# Patient Record
Sex: Male | Born: 1980 | Race: Black or African American | Hispanic: No | Marital: Single
Health system: Midwestern US, Community
[De-identification: ages and names within clinical notes are randomized; demographics above are authoritative.]

## PROBLEM LIST (undated history)

## (undated) DIAGNOSIS — M549 Dorsalgia, unspecified: Secondary | ICD-10-CM

## (undated) DIAGNOSIS — I10 Essential (primary) hypertension: Secondary | ICD-10-CM

## (undated) DIAGNOSIS — E119 Type 2 diabetes mellitus without complications: Secondary | ICD-10-CM

## (undated) DIAGNOSIS — M79606 Pain in leg, unspecified: Secondary | ICD-10-CM

## (undated) DIAGNOSIS — R202 Paresthesia of skin: Secondary | ICD-10-CM

## (undated) DIAGNOSIS — I82409 Acute embolism and thrombosis of unspecified deep veins of unspecified lower extremity: Secondary | ICD-10-CM

## (undated) DIAGNOSIS — G8929 Other chronic pain: Secondary | ICD-10-CM

## (undated) DIAGNOSIS — M5430 Sciatica, unspecified side: Secondary | ICD-10-CM

## (undated) DIAGNOSIS — R7303 Prediabetes: Secondary | ICD-10-CM

## (undated) DIAGNOSIS — R609 Edema, unspecified: Secondary | ICD-10-CM

## (undated) DIAGNOSIS — I739 Peripheral vascular disease, unspecified: Secondary | ICD-10-CM

## (undated) DIAGNOSIS — Z7689 Persons encountering health services in other specified circumstances: Secondary | ICD-10-CM

## (undated) HISTORY — PX: OTHER SURGICAL HISTORY: SHX169

## (undated) HISTORY — DX: Type 2 diabetes mellitus without complications: E11.9

## (undated) HISTORY — PX: FASCIOTOMY: SHX132

## (undated) HISTORY — PX: THROMBECTOMY: PRO61

---

## 2011-09-24 NOTE — ED Notes (Signed)
Pt c/o insomnia and requesting to be seen in sleep study.

## 2011-09-25 NOTE — ED Provider Notes (Signed)
Presented requesting to be seen in sleep study,

## 2012-06-14 MED ORDER — OXYCODONE-ACETAMINOPHEN 5 MG-325 MG TAB
5-325 mg | ORAL_TABLET | ORAL | Status: DC | PRN
Start: 2012-06-14 — End: 2013-03-25

## 2012-06-14 NOTE — ED Provider Notes (Signed)
I was personally available for consultation in the emergency department. I have reviewed the chart prior to the patient's discharge and agree with the documentation recorded by the MLP, including the assessment, treatment plan, and disposition.

## 2012-06-14 NOTE — ED Notes (Signed)
I have reviewed discharge instructions with the patient.  The patient verbalized understanding.

## 2012-06-14 NOTE — ED Provider Notes (Addendum)
HPI Comments: Patient states that he was playing around with this kids tonight, did a back flip and landed wrong on his Lt ankle. Since then it hurts to bear weight, or move. He thinks it is broken. He admits to drinking a six pack of beer prior to injury. No other symptoms or complaints.    The history is provided by the patient.        History reviewed. No pertinent past medical history.     History reviewed. No pertinent past surgical history.      History reviewed. No pertinent family history.     History     Social History   ??? Marital Status: MARRIED     Spouse Name: N/A     Number of Children: N/A   ??? Years of Education: N/A     Occupational History   ??? Not on file.     Social History Main Topics   ??? Smoking status: Current Every Day Smoker -- 0.50 packs/day   ??? Smokeless tobacco: Not on file   ??? Alcohol Use: Yes      Comment: 3times/week   ??? Drug Use: No   ??? Sexually Active: Not on file     Other Topics Concern   ??? Not on file     Social History Narrative   ??? No narrative on file                  ALLERGIES: Review of patient's allergies indicates no known allergies.      Review of Systems   Constitutional: Negative for fever, chills and fatigue.   HENT: Negative for ear pain, congestion, sore throat, facial swelling, rhinorrhea, trouble swallowing, neck pain, neck stiffness, voice change and sinus pressure.    Eyes: Negative for photophobia, pain, discharge and visual disturbance.   Respiratory: Negative for cough, chest tightness, shortness of breath and wheezing.    Cardiovascular: Negative for chest pain and leg swelling.   Gastrointestinal: Negative for nausea, vomiting, abdominal pain, diarrhea, constipation and blood in stool.   Endocrine: Negative for polyuria.   Genitourinary: Negative for dysuria, urgency, frequency, hematuria and flank pain.   Musculoskeletal: Positive for arthralgias. Negative for back pain, joint swelling and gait problem.        Lt ankle pain.   Skin: Negative for rash and wound.    Allergic/Immunologic: Negative for immunocompromised state.   Neurological: Negative for dizziness, weakness, light-headedness, numbness and headaches.   Hematological: Negative for adenopathy.   Psychiatric/Behavioral: Negative for suicidal ideas, hallucinations, confusion and agitation. The patient is not nervous/anxious.        Filed Vitals:    06/14/12 0035 06/14/12 0037   BP:  91/50   Pulse: 55    Temp: 98.6 ??F (37 ??C)    Resp: 18    Height: 5\' 2"  (1.575 m)    Weight: 68.04 kg (150 lb)    SpO2: 100%             Physical Exam   Nursing note and vitals reviewed.  Constitutional: He is oriented to person, place, and time. He appears well-developed and well-nourished. No distress.   Neck: Normal range of motion. Neck supple.   Cardiovascular: Normal rate, regular rhythm, normal heart sounds and intact distal pulses.  Exam reveals no gallop and no friction rub.    No murmur heard.  Pulmonary/Chest: Effort normal and breath sounds normal. No respiratory distress. He has no wheezes. He has no rales. He exhibits  no tenderness.   Musculoskeletal: He exhibits edema and tenderness.   Lt lateral, anterior, and medial ankle is TTP with moderate edema.  LROM to ankle due to pain, FROM to toes.  Distal pulses, motor, and sensation are intact. Strength is 5/5.   Lymphadenopathy:     He has no cervical adenopathy.   Neurological: He is alert and oriented to person, place, and time. He has normal reflexes.   Skin: Skin is warm and dry. No rash noted. He is not diaphoretic.   Psychiatric: He has a normal mood and affect. His behavior is normal. Judgment and thought content normal.        MDM     Differential Diagnosis; Clinical Impression; Plan:     Ankle pain, ankle sprain, ankle contusion, ankle fracture, fall.  Amount and/or Complexity of Data Reviewed:   Tests in the radiology section of CPT??:  Ordered and reviewed  Discussion of test results with the performing providers:  No   Decide to obtain previous medical records or  to obtain history from someone other than the patient:  No   Obtain history from someone other than the patient:  No   Review and summarize past medical records:  No   Discuss the patient with another provider:  No   Independant visualization of image, tracing, or specimen:  Yes  Risk of Significant Complications, Morbidity, and/or Mortality:   Presenting problems:  Low  Diagnostic procedures:  Low  Management options:  Low  Progress:   Patient progress:  Stable      Procedures    1:13 AM  Reviewed treatment, medication, lab results, x-ray results, and follow up plan with patient. Answered patient's questions.  Patient is ready for discharge.    A Lt Ankle X-Ray was ordered. My reading of this film is Fracture to the medial malleolus. (No comparison films available: pending review by Radiologist.)    Sugar tongs and posterior Splint was correctly applied to the Lt ankle by ED [facility] Splint is in a good position.  Pulse, motor and sensation were intact before and after splint was applied.    Diagnosis:   1. Fracture of medial malleolus, left, closed          Disposition: Home    Follow-up Information    Follow up With Details Comments Contact Info    [facility] Call in 1 day Without fail to make an appointment [address]  [address]  [place] [address]  [phone]          Current Discharge Medication List      START taking these medications    Details   oxyCODONE-acetaminophen (PERCOCET) 5-325 mg per tablet Take 1 Tab by mouth every four (4) hours as needed for Pain.  Qty: 20 Tab, Refills: 0

## 2012-06-14 NOTE — ED Notes (Signed)
Please note patient appears lethargic in triage and is sweating.  Patient states he has a six pack of beer before coming in and that is probably he is sweating.

## 2012-06-14 NOTE — ED Notes (Signed)
Patient states he was playing around with his kids tonight and landed left his ankle wrong.  Patient states he thinks something is broken.  Patient states the worse of his pain is in his left ankle.

## 2012-07-05 MED ORDER — CYCLOBENZAPRINE 10 MG TAB
10 mg | ORAL_TABLET | Freq: Three times a day (TID) | ORAL | Status: AC
Start: 2012-07-05 — End: 2012-07-08

## 2012-07-05 MED ORDER — OXYCODONE-ACETAMINOPHEN 5 MG-325 MG TAB
5-325 mg | ORAL_TABLET | ORAL | Status: DC
Start: 2012-07-05 — End: 2013-04-04

## 2012-07-05 NOTE — ED Provider Notes (Signed)
I was personally available for consultation in the emergency department.  I have not seen the patient but have reviewed the chart prior to discharge and agree with the documentation recorded by the MLP, including the assessment, treatment plan, and disposition.  Yazmin Locher, DO
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
## 2013-03-24 NOTE — ED Provider Notes (Signed)
HPI Comments: William Wild. is a 32 y.o. Male presents to the ED C/O Abdominal Pain that onset one day ago. He rates the Pain as a seven out of ten. Patient points to his Epigastric for the origin of the Pain. Patient also c/o Nausea and Vomiting. Patient denies any exacerbating or alleviating factor. He denies Hematemesis, BRBPR, Urinary Sx or any other complaints.     The history is provided by the patient.        Past Medical History   Diagnosis Date   ??? Asthma         History reviewed. No pertinent past surgical history.      History reviewed. No pertinent family history.     History     Social History   ??? Marital Status: LEGALLY SEPARATED     Spouse Name: N/A     Number of Children: N/A   ??? Years of Education: N/A     Occupational History   ??? Not on file.     Social History Main Topics   ??? Smoking status: Former Smoker -- 0.50 packs/day   ??? Smokeless tobacco: Not on file   ??? Alcohol Use: Yes      Comment: occasional   ??? Drug Use: No   ??? Sexually Active: Not on file     Other Topics Concern   ??? Not on file     Social History Narrative   ??? No narrative on file                  ALLERGIES: Review of patient's allergies indicates no known allergies.      Review of Systems   Constitutional: Negative for fever and chills.   HENT: Negative for congestion and sore throat.    Eyes: Negative for redness and visual disturbance.   Respiratory: Negative for shortness of breath.    Cardiovascular: Negative for chest pain.   Gastrointestinal: Positive for nausea, vomiting and abdominal pain. Negative for diarrhea.   Genitourinary: Negative for difficulty urinating.   Musculoskeletal: Negative for myalgias and arthralgias.   Skin: Negative for rash.   Neurological: Negative for headaches.   Psychiatric/Behavioral: Negative for dysphoric mood.       Filed Vitals:    03/24/13 2245   BP: 135/102   Pulse: 94   Temp: 99.2 ??F (37.3 ??C)   Resp: 16   Height: 5\' 4"  (1.626 m)   Weight: 70.308 kg (155 lb)   SpO2: 98%             Physical Exam   Nursing note and vitals reviewed.  Constitutional: He is oriented to person, place, and time. He appears well-developed and well-nourished. No distress.   HENT:   Head: Normocephalic and atraumatic.   Nose: Nose normal.   Eyes: Right eye exhibits no discharge. Left eye exhibits no discharge. No scleral icterus.   Neck: Neck supple. No JVD present.   Cardiovascular: Normal rate, regular rhythm and normal heart sounds.  Exam reveals no gallop and no friction rub.    No murmur heard.  Pulmonary/Chest: Effort normal and breath sounds normal. No respiratory distress. He has no wheezes. He has no rales.   Abdominal: Soft. He exhibits no mass. There is tenderness in the right upper quadrant. There is no rebound and no guarding.   Musculoskeletal: He exhibits no edema and no tenderness.   Neurological: He is alert and oriented to person, place, and time. He exhibits normal muscle tone.  Coordination normal.   Skin: Skin is warm and dry. No rash noted. He is not diaphoretic.   Psychiatric: He has a normal mood and affect.        MDM     Differential Diagnosis; Clinical Impression; Plan:     Relief with GI cocktail fu pcp   Amount and/or Complexity of Data Reviewed:    Review and summarize past medical records:  Yes      Procedures    -------------------------------------------------------------------------------------------------------------------     EKG INTERPRETATIONS:  NONE    RADIOLOGY RESULTS:    None      ORDERS  Orders Placed This Encounter   ??? mylanta/donnatal/viscous lidocaine (GI COCKTAIL)   ??? oxyCODONE-acetaminophen (PERCOCET) 5-325 mg per tablet           LAB RESULTS:   No results found for this or any previous visit (from the past 12 hour(s)).        CONSULTATIONS:  NONE        PROGRESS NOTES:  10:47 PM  Dr. Diona Browner, MD reviewed treatment plan with patient. Reviewed medications i.e. bottles provided. Answered his or her questions.   12:00 AM: Pt has relief of symptoms after GI cocktail. Pt  is ready to be discharged home.      ED DIAGNOSES & DISPOSITIONS:   Diagnosis:   1. GERD (gastroesophageal reflux disease)          Disposition: Home    Follow-up Information    Follow up With Details Comments Contact Info    [facility] Call  [address]  [place] [address]  [phone]          Current Discharge Medication List      START taking these medications    Details   !! oxyCODONE-acetaminophen (PERCOCET) 5-325 mg per tablet Take 1 tablet by mouth every four (4) hours as needed for Pain.  Qty: 12 tablet, Refills: 0       !! - Potential duplicate medications found. Please discuss with provider.      CONTINUE these medications which have NOT CHANGED    Details   HYDROcodone-acetaminophen (NORCO) 10-325 mg tablet Take 2 Tabs by mouth.      !! oxyCODONE-acetaminophen (PERCOCET) 5-325 mg per tablet Take 1 tablet every 4-6 hours as needed for pain control.  If you were instructed to try over the counter ibuprofen or tylenol, only take the percocet for pain not controlled with the over the counter medication.  Qty: 12 Tab, Refills: 0      !! oxyCODONE-acetaminophen (PERCOCET) 5-325 mg per tablet Take 1 Tab by mouth every four (4) hours as needed for Pain.  Qty: 20 Tab, Refills: 0       !! - Potential duplicate medications found. Please discuss with provider.              SCRIBE ATTESTATION STATEMENT  Documented by: Elvis Coil (10:43 PM) & Avelina Laine (12:00 AM)  scribing for and in the presence of William Innocent D Solstice Lastinger, MD. (10:43 PM)          PROVIDER ATTESTATION STATEMENT  I personally performed the services described in the documentation, reviewed the documentation, as recorded by the scribe in my presence, and it accurately and completely records my words and actions.  Temika Sutphin Marlene Lard, MD. 2:02 AM    -------------------------------------------------------------------------------------------------------------------

## 2013-03-24 NOTE — ED Notes (Signed)
Patient resting with eyes closed, states that his abdominal pain is gone now but states it may start again as the pain has been intermittent

## 2013-03-25 MED ORDER — OXYCODONE-ACETAMINOPHEN 5 MG-325 MG TAB
5-325 mg | ORAL_TABLET | ORAL | Status: DC | PRN
Start: 2013-03-25 — End: 2013-03-25

## 2013-03-25 MED ORDER — LIDOCAINE 2 % MUCOSAL SOLN
2 % | Freq: Once | Status: AC
Start: 2013-03-25 — End: 2013-03-24
  Administered 2013-03-25: 04:00:00 via ORAL

## 2013-03-25 MED ORDER — FAMOTIDINE 20 MG TAB
20 mg | ORAL_TABLET | Freq: Two times a day (BID) | ORAL | Status: AC
Start: 2013-03-25 — End: 2013-04-04

## 2013-03-25 NOTE — ED Notes (Signed)
Patient evaluated and treated last night with dx of GERD. Reports some relief with Percocet and Pepcid. States pain is improved but still there and that he does not want to try and work while taking Percocet to control pain. Denies N/V/diarrhea/contipation. Denies SOB, denies chest pain.

## 2013-03-25 NOTE — ED Provider Notes (Signed)
HPI Comments: William Christiano. is a 32 y.o. Male seen yesterday in ED for abdominal pain states has improved although still taking Percocet for pain a few hours ago and now does not want to go to work on pain medication. Denies any n/v since yesterday denies any pain at present states has not had any relief from pepcid so stopped taking it.     Social + tob and alcohol 2 nights ago    No previous abdominal surgeries     Patient is a 32 y.o. male presenting with abdominal pain. The history is provided by the patient.   Abdominal Pain   Pertinent negatives include no fever, no diarrhea, no nausea, no vomiting, no headaches, no chest pain, no testicular pain and no back pain.        Past Medical History   Diagnosis Date   ??? Asthma         History reviewed. No pertinent past surgical history.      History reviewed. No pertinent family history.     History     Social History   ??? Marital Status: LEGALLY SEPARATED     Spouse Name: N/A     Number of Children: N/A   ??? Years of Education: N/A     Occupational History   ??? Not on file.     Social History Main Topics   ??? Smoking status: Former Smoker -- 0.50 packs/day   ??? Smokeless tobacco: Not on file   ??? Alcohol Use: Yes      Comment: occasional   ??? Drug Use: No   ??? Sexually Active: Not on file     Other Topics Concern   ??? Not on file     Social History Narrative   ??? No narrative on file                  ALLERGIES: Review of patient's allergies indicates no known allergies.      Review of Systems   Constitutional: Negative for fever, chills, diaphoresis, activity change and fatigue.   HENT: Negative for neck pain.    Eyes: Negative for visual disturbance.   Respiratory: Negative for cough and chest tightness.    Cardiovascular: Negative for chest pain.   Gastrointestinal: Positive for abdominal pain (resolved at present). Negative for nausea, vomiting and diarrhea.   Genitourinary: Negative for flank pain and testicular pain.   Musculoskeletal: Negative for back pain.    Skin: Negative.    Neurological: Negative for dizziness and headaches.   Hematological: Negative.        Filed Vitals:    03/25/13 1925   BP: 148/100   Pulse: 91   Temp: 98 ??F (36.7 ??C)   Resp: 14   Height: 5\' 4"  (1.626 m)   Weight: 68.04 kg (150 lb)   SpO2: 99%            Physical Exam   Nursing note and vitals reviewed.  Constitutional: He is oriented to person, place, and time. He appears well-developed and well-nourished.   HENT:   Head: Normocephalic and atraumatic.   Mouth/Throat: Oropharynx is clear and moist.   Eyes: Conjunctivae are normal. Pupils are equal, round, and reactive to light.   Neck: Normal range of motion. Neck supple.   Cardiovascular: Normal rate, regular rhythm and normal heart sounds.    Pulmonary/Chest: Effort normal and breath sounds normal.   Abdominal: Soft. Bowel sounds are normal. There is no tenderness. There is  no rebound and no guarding.   Musculoskeletal: Normal range of motion.   Neurological: He is alert and oriented to person, place, and time.   Skin: Skin is warm and dry.        MDM     Differential Diagnosis; Clinical Impression; Plan:     No abdominal pain or tenderness at present will not further work up pt       Procedures    Diagnosis:   1. Abdominal pain, epigastric          Disposition: home    Follow-up Information    Follow up With Details Comments Contact Info    [facility] EMERGENCY DEPT  As needed if symptoms worsen [address]  [place] [address]  [phone]    Julian Hy, MD Schedule an appointment as soon as possible for a visit for ED follow up appointment [address]  [address]  [place] [address]  [phone]            Patient's Medications   Start Taking    No medications on file   Continue Taking    FAMOTIDINE (PEPCID) 20 MG TABLET    Take 1 tablet by mouth two (2) times a day for 10 days.    OXYCODONE-ACETAMINOPHEN (PERCOCET) 5-325 MG PER TABLET    Take 1 tablet every 4-6 hours as needed for pain control.  If you were instructed to try over the  counter ibuprofen or tylenol, only take the percocet for pain not controlled with the over the counter medication.   These Medications have changed    No medications on file   Stop Taking    HYDROCODONE-ACETAMINOPHEN (NORCO) 10-325 MG TABLET    Take 2 Tabs by mouth.    OXYCODONE-ACETAMINOPHEN (PERCOCET) 5-325 MG PER TABLET    Take 1 Tab by mouth every four (4) hours as needed for Pain.    OXYCODONE-ACETAMINOPHEN (PERCOCET) 5-325 MG PER TABLET    Take 1 tablet by mouth every four (4) hours as needed for Pain.

## 2013-03-25 NOTE — ED Notes (Signed)
Patient armband removed and shredded  Reviewed discharge instructions with pt regarding GERD. Verbalized understanding.

## 2013-03-25 NOTE — ED Notes (Signed)
I have reviewed discharge instructions with the patient.  The patient verbalized understanding.  Patient armband removed and shredded

## 2013-03-25 NOTE — ED Notes (Signed)
Patient states he was here yesterday and given Pepcid for stomach pain.  Patient points to upper epigastric area when asked where pain is located.  Patient states the Pepcid he was given last night did no help.  Patient states he is still having pain and needs to go to work tonight.

## 2013-03-26 MED ORDER — FAMOTIDINE 20 MG TAB
20 mg | ORAL | Status: AC
Start: 2013-03-26 — End: 2013-03-25
  Administered 2013-03-26: 01:00:00 via ORAL

## 2013-03-29 NOTE — ED Notes (Signed)
Needs work note to say he no longer needs percocet. For his abdominal pain

## 2013-03-29 NOTE — ED Notes (Signed)
Patient armband removed and shredded       The discharge information has been reviewed with the patient.  The patient verbalized understanding.

## 2013-03-29 NOTE — ED Provider Notes (Signed)
.  I was personally available for consultation in the emergency department.  I have not seen the patient but have reviewed the chart prior to discharge and agree with the documentation recorded by the MLP, including the assessment, treatment plan, and disposition.  Roston Grunewald A Tinsley Everman, MD

## 2013-03-29 NOTE — ED Provider Notes (Signed)
Patient is a 32 y.o. male presenting with other event.   Other    pt seen in ED last week with epigastric pain.  D/c'd home with oral Percocet.  Hasn't had any narcotic since evening of 03/27/13.  Wants work note to return.  Denies n/v/d, hematochezia, melena, CP, SOB, fever, cough, abd pain.    Denies tobacco, ETOH, illicits.    Past Medical History   Diagnosis Date   ??? Asthma         History reviewed. No pertinent past surgical history.      History reviewed. No pertinent family history.     History     Social History   ??? Marital Status: LEGALLY SEPARATED     Spouse Name: N/A     Number of Children: N/A   ??? Years of Education: N/A     Occupational History   ??? Not on file.     Social History Main Topics   ??? Smoking status: Former Smoker -- 0.50 packs/day   ??? Smokeless tobacco: Not on file   ??? Alcohol Use: Yes      Comment: occasional   ??? Drug Use: No   ??? Sexually Active: Not on file     Other Topics Concern   ??? Not on file     Social History Narrative   ??? No narrative on file                  ALLERGIES: Review of patient's allergies indicates no known allergies.      Review of Systems   All other systems reviewed and are negative.        Filed Vitals:    03/29/13 1018   BP: 137/94   Pulse: 80   Temp: 98.5 ??F (36.9 ??C)   Resp: 17   SpO2: 100%            Physical Exam   Nursing note and vitals reviewed.  Constitutional: Vital signs are normal. He appears well-developed and well-nourished. He is active.  Non-toxic appearance. He does not appear ill. No distress.   HENT:   Head: Normocephalic and atraumatic.   Eyes: EOM are normal. No scleral icterus.   Neck: Normal range of motion. Neck supple. Carotid bruit is not present. No tracheal deviation present. No thyromegaly present.   Cardiovascular: Normal rate, regular rhythm and normal heart sounds.  Exam reveals no gallop and no friction rub.    No murmur heard.  Pulmonary/Chest: Effort normal and breath sounds normal. No stridor. No respiratory distress. He has no  wheezes. He has no rales. He exhibits no tenderness.   Abdominal: Soft. He exhibits no distension and no mass. There is no tenderness. There is no rebound, no guarding and no CVA tenderness.   Musculoskeletal: Normal range of motion.   Neurological: He is alert.   Skin: Skin is warm, dry and intact. He is not diaphoretic. No pallor.   Psychiatric: He has a normal mood and affect. His speech is normal and behavior is normal. Judgment and thought content normal.        MDM     Differential Diagnosis; Clinical Impression; Plan:     Well adult. Home.    Amount and/or Complexity of Data Reviewed:    Review and summarize past medical records:  Yes      Procedures    11:04 AM  Diagnosis:   1. Well adult exam          Disposition:  home    Follow-up Information    Follow up With Details Comments Contact Info    [facility] EMERGENCY DEPT  If symptoms worsen return immediately [address]  [place] [address]  [phone]          Patient's Medications   Start Taking    No medications on file   Continue Taking    FAMOTIDINE (PEPCID) 20 MG TABLET    Take 1 tablet by mouth two (2) times a day for 10 days.    OXYCODONE-ACETAMINOPHEN (PERCOCET) 5-325 MG PER TABLET    Take 1 tablet every 4-6 hours as needed for pain control.  If you were instructed to try over the counter ibuprofen or tylenol, only take the percocet for pain not controlled with the over the counter medication.   These Medications have changed    No medications on file   Stop Taking    No medications on file

## 2013-04-04 MED ORDER — OXYCODONE-ACETAMINOPHEN 5 MG-325 MG TAB
5-325 mg | ORAL_TABLET | ORAL | Status: DC | PRN
Start: 2013-04-04 — End: 2013-04-11

## 2013-04-04 MED ORDER — NAPROXEN 500 MG TAB
500 mg | ORAL_TABLET | Freq: Two times a day (BID) | ORAL | Status: DC
Start: 2013-04-04 — End: 2013-04-04

## 2013-04-04 MED ORDER — DIAZEPAM 5 MG TAB
5 mg | ORAL_TABLET | Freq: Three times a day (TID) | ORAL | Status: DC | PRN
Start: 2013-04-04 — End: 2013-04-08

## 2013-04-04 MED ORDER — PREDNISONE 20 MG TAB
20 mg | ORAL | Status: AC
Start: 2013-04-04 — End: 2013-04-04
  Administered 2013-04-04: 16:00:00 via ORAL

## 2013-04-04 MED ORDER — HYDROMORPHONE 2 MG/ML INJECTION SOLUTION
2 mg/mL | INTRAMUSCULAR | Status: AC
Start: 2013-04-04 — End: 2013-04-04
  Administered 2013-04-04: 16:00:00 via INTRAMUSCULAR

## 2013-04-04 MED ORDER — ONDANSETRON 4 MG TAB, RAPID DISSOLVE
4 mg | ORAL | Status: AC
Start: 2013-04-04 — End: 2013-04-04
  Administered 2013-04-04: 16:00:00 via ORAL

## 2013-04-04 MED ORDER — PREDNISONE 20 MG TAB
20 mg | ORAL_TABLET | ORAL | Status: DC
Start: 2013-04-04 — End: 2013-04-16

## 2013-04-04 MED ORDER — KETOROLAC TROMETHAMINE 60 MG/2 ML IM
60 mg/2 mL | INTRAMUSCULAR | Status: AC
Start: 2013-04-04 — End: 2013-04-04
  Administered 2013-04-04: 09:00:00 via INTRAMUSCULAR

## 2013-04-04 NOTE — ED Notes (Signed)
Started yesterday with pain in the left buttock/hip area that radiates down the left leg--thinks it is a pinched nerve--hurts bad.

## 2013-04-04 NOTE — ED Provider Notes (Addendum)
HPI Comments: William Griffin. is a  32 y.o. 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 Luisa Dago. He says it is extremely painful to bear weight. Denies fever, chills, dizziness, HA, neck pain/stiffness, cp, sob, palps, cough, leg swelling, abd pain, n/v/d, constipation, brbpr, melena, loss of bowel/bladder control, flank pain, blood in urine, saddle anesthesia, numbness/tingling, weakness, difficulty ambulating.           Patient is a 32 y.o. male presenting with back pain.   Back Pain   Pertinent negatives include no chest pain, no fever, no headaches, no abdominal pain and no dysuria.        Past Medical History   Diagnosis Date   ??? Asthma         History reviewed. No pertinent past surgical history.      History reviewed. No pertinent family history.     History     Social History   ??? Marital Status: LEGALLY SEPARATED     Spouse Name: N/A     Number of Children: N/A   ??? Years of Education: N/A     Occupational History   ??? Not on file.     Social History Main Topics   ??? Smoking status: Former Smoker -- 0.50 packs/day   ??? Smokeless tobacco: Never Used   ??? Alcohol Use: Yes      Comment: occasional   ??? Drug Use: No   ??? Sexually Active: Not on file     Other Topics Concern   ??? Not on file     Social History Narrative   ??? No narrative on file                  ALLERGIES: Review of patient's allergies indicates no known allergies.      Review of Systems   Constitutional: Negative for fever and chills.   HENT: Negative for ear pain and sore throat.    Eyes: Negative.  Negative for pain and discharge.   Respiratory: Negative for cough and shortness of breath.    Cardiovascular: Negative for chest pain and palpitations.   Gastrointestinal: Negative for nausea, vomiting, abdominal pain and diarrhea.   Genitourinary:  Negative for dysuria, urgency and frequency.   Musculoskeletal: Positive for back pain. Negative for joint swelling and arthralgias.   Skin: Negative for color change, pallor, rash and wound.   Neurological: Negative for dizziness and headaches.   Psychiatric/Behavioral: Negative for behavioral problems.       Filed Vitals:    04/04/13 1052   BP: 132/89   Pulse: 99   Temp: 98.3 ??F (36.8 ??C)   Resp: 18   SpO2: 100%            Physical Exam   Nursing note and vitals reviewed.  Constitutional: He is oriented to person, place, and time. He appears well-developed and well-nourished. No distress.   Cardiovascular: Normal rate, regular rhythm and normal heart sounds.    Pulmonary/Chest: Effort normal and breath sounds normal. No respiratory distress. He has no wheezes. He has no rales. He exhibits no tenderness.   Abdominal: Soft. Bowel sounds are normal. He exhibits no distension. There is no tenderness.   Musculoskeletal: Normal range of motion.        Cervical back: Normal.  Thoracic back: Normal.        Lumbar back: He exhibits tenderness, pain and spasm. He exhibits normal range of motion, no bony tenderness, no swelling, no edema, no deformity, no laceration and normal pulse.        Right lower leg: Normal. He exhibits no tenderness, no bony tenderness, no swelling, no edema, no deformity and no laceration.        Left lower leg: Normal. He exhibits no tenderness, no bony tenderness, no swelling, no edema, no deformity and no laceration.   + TTP overlying the left piriformis region. + SLR (L). MS 5/5 throughout BUE/BLE.     No midline cervical, thoracic, lumbar TTP. Normal inspection. FROM w/o pain.     Difficulty weight bearing on left leg d/t pain.     Absolutely no LE swelling/edema/calf TTP. DP/PT 2+ BLE.    Neurological: He is alert and oriented to person, place, and time.   Skin: Skin is warm and dry. He is not diaphoretic.        MDM     Differential Diagnosis; Clinical Impression; Plan:     DDX:  Sciatica, Lumbar radiculopathy, Back Sprain, Back Strain, DJD, HNP, Epidural abscess, Cauda Equina Syndrome, Contusion, Pyelonephritis, Renal Colic, Rib Fracture, Rib Contusion, Costochondritis, Pleurisy, Pleurodynia, Pneumothorax, Thoracic Aneurysm, Abdominal Aortic Aneurysm, Nephro-ureteral Calculus, Acute Myocardial Infarction.    Dilaudid 2 mg IM Administered.     1206 PM  Patient demonstrates he is able to weight bear on the LLE. Pain moderately improved. Denies trauma. He is afebrile, no calf pain/swelling/redness, No loss of b/b function. He has no hip TTP. No focal neurologic deficits. Describes pain as sharp and radiating from the left buttock to foot. He was prescribed Valium last night in the ED which he relates has NOT helped.     Recommend f/u Ortho within 1-2 days without fail.     Reviewed workup results, any meds, and discharge instructions OR admission plan with patient and any family present.  Answered all questions. Michaelyn Barter, PA  11:42 AM    PLEASE FOLLOW-UP AS DIRECTED WITHOUT FAIL WITHIN THE TIME FRAME RECOMMENDED AS FAILURE TO DO SO COULD RESULT IN WORSENING OF YOUR PHYSICAL CONDITION, DEATH, AND OR PERMANENT DISABILITY.     RETURN TO THE EMERGENCY DEPARTMENT IF YOU ARE UNABLE TO FOLLOW-UP AS DIRECTED.     RETURN TO THE EMERGENCY DEPARTMENT IF YOU HAVE SYMPTOMS THAT DO NOT IMPROVE WITH TREATMENT, NEW SYMPTOMS, WORSENING SYMPTOMS, OR ANY OTHER CONCERNS.     THE PATIENT AGREES WITH THE DISCHARGE PLAN AND FOLLOW-UP INSTRUCTIONS. THE PATIENT AGREES TO REVIEW ALL HANDOUTS.       Amount and/or Complexity of Data Reviewed:    Decide to obtain previous medical records or to obtain history from someone other than the patient:  Yes   Obtain history from someone other than the patient:  No   Review and summarize past medical records:  Yes   Discuss the patient with another provider:  No   Independant visualization of image, tracing, or specimen:  Yes  Risk of Significant Complications, Morbidity,  and/or Mortality:   Presenting problems:  Moderate  Diagnostic procedures:  Low  Management options:  Moderate      Procedures    Diagnosis:   1. Sciatica, left          Disposition: HOME    Follow-up Information    Follow up With Details Comments Contact Info    [facility] EMERGENCY DEPT  As needed if symptoms worsen [address]  [place] [address]  [phone]    Katheren Shams, MD Call today Orthopedics: Schedule appointment to be seen within 1 day without fail.  [address]  [address]  [place] [address]  [phone]            Current Discharge Medication List      START taking these medications    Details   predniSONE (DELTASONE) 20 mg tablet Begin taking tomorrow Tuesday, April 05, 2013 as you received your initial dose while in the ER.    3 tabs po every day x 3 days, then 2 tabs po every day x 3 days, then 1 tab po every day x 3 days.  Qty: 18 tablet, Refills: 0         CONTINUE these medications which have CHANGED    Details   oxyCODONE-acetaminophen (PERCOCET) 5-325 mg per tablet Take 1-2 tablets by mouth every four (4) hours as needed (BREAKTHROUGH PAIN ONLY, DO NOT FILL UNLESS PREDNISONE IS FILLED.).  Qty: 30 tablet, Refills: 0         CONTINUE these medications which have NOT CHANGED    Details   diazepam (VALIUM) 5 mg tablet Take 1 tablet by mouth three (3) times daily as needed (spasm).  Qty: 20 tablet, Refills: 0      famotidine (PEPCID) 20 mg tablet Take 1 tablet by mouth two (2) times a day for 10 days.  Qty: 30 tablet, Refills: 0             No results found for this or any previous visit (from the past 24 hour(s)).

## 2013-04-04 NOTE — ED Notes (Signed)
PT. Medicated as ordered, resting in bed at this time stable will continue to monitor.

## 2013-04-04 NOTE — ED Notes (Signed)
Discharge instructions reviewed with pt- pt verbalized understanding - pt stable discharged via wheelchair with sister, pts. Ride home.

## 2013-04-04 NOTE — ED Provider Notes (Signed)
HPI Comments: CC: L leg    HPI: 2 days of pain in his L buttock that will radiate down the back of the leg to his toes assoc s numbness/tingling in his toes. Pain is worse with walking and better at rest, took advil yest AM s much improvement.   Denies any weakness, injury, rash, swelling, N/V, groin pain, joint pain    PMHx - denies any    ROS: per HPI    Patient is a 32 y.o. male presenting with hip pain. The history is provided by the patient.   Hip Pain          Past Medical History   Diagnosis Date   ??? Asthma         History reviewed. No pertinent past surgical history.      History reviewed. No pertinent family history.     History     Social History   ??? Marital Status: LEGALLY SEPARATED     Spouse Name: N/A     Number of Children: N/A   ??? Years of Education: N/A     Occupational History   ??? Not on file.     Social History Main Topics   ??? Smoking status: Former Smoker -- 0.50 packs/day   ??? Smokeless tobacco: Never Used   ??? Alcohol Use: Yes      Comment: occasional   ??? Drug Use: No   ??? Sexually Active: Not on file     Other Topics Concern   ??? Not on file     Social History Narrative   ??? No narrative on file                  ALLERGIES: Review of patient's allergies indicates no known allergies.      Review of Systems    Filed Vitals:    04/04/13 0212   BP: 132/89   Pulse: 97   Temp: 98 ??F (36.7 ??C)   Resp: 14   Height: 5\' 4"  (1.626 m)   Weight: 68.04 kg (150 lb)   SpO2: 100%            Physical Exam   Nursing note and vitals reviewed.  Constitutional: He appears well-developed and well-nourished. No distress.   HENT:   Head: Normocephalic and atraumatic.   Right Ear: External ear normal.   Left Ear: External ear normal.   Nose: Nose normal.   Mouth/Throat: Oropharynx is clear and moist.   Eyes: Conjunctivae are normal. Right eye exhibits no discharge. Left eye exhibits no discharge. No scleral icterus.   No periorbital edema/erythema noted   Neck: Normal range of motion. No JVD present.   Cardiovascular: Normal  rate, regular rhythm and intact distal pulses.    2+ Bilat DP   Pulmonary/Chest: Effort normal. No stridor. No respiratory distress.   Musculoskeletal: He exhibits no edema and no tenderness.   LLE: Comp soft, zero edema, no pain c ROM of any joint save + LLE SLR. No bony/ST TTP.    Neurological: He is alert. He exhibits normal muscle tone. Coordination normal.   No obvious deficit noted - sym and normal power Bilat LE.   No facial droop  Fluent speech     Skin: Skin is warm and dry. No rash noted. He is not diaphoretic. No erythema. No pallor.   Psychiatric: He has a normal mood and affect. His behavior is normal. Judgment and thought content normal.   Pt displays good insight and  has sound decision making capacitance         MDM     Differential Diagnosis; Clinical Impression; Plan:     Classic sciatica pain - no BP, no weakness = no concern for spinal cord/cauda equina path.   Do not think ischemia/VTE      Procedures    -------------------------------------------------------------------------------------------------------------------   DISPOSITION:  ED DIAGNOSIS & DISPOSITION INFORMATION  Diagnosis:   1. Sciatica          Disposition: home    Follow-up Information    Follow up With Details Comments Contact Info    Jonah R Flowers, DO Schedule an appointment as soon as possible for a visit within two days [address]  [address] [place] [address]  [phone]      [facility] EMERGENCY DEPT  return immediately if any worsening symptoms [address]  [place] [address]  [phone]          Current Discharge Medication List      START taking these medications    Details   naproxen (NAPROSYN) 500 mg tablet Take 1 tablet by mouth two (2) times daily (with meals) for 15 days.  Qty: 30 tablet, Refills: 0      diazepam (VALIUM) 5 mg tablet Take 1 tablet by mouth three (3) times daily as needed (spasm).  Qty: 20 tablet, Refills: 0         CONTINUE these medications which have NOT CHANGED     Details   famotidine (PEPCID) 20 mg tablet Take 1 tablet by mouth two (2) times a day for 10 days.  Qty: 30 tablet, Refills: 0      oxyCODONE-acetaminophen (PERCOCET) 5-325 mg per tablet Take 1 tablet every 4-6 hours as needed for pain control.  If you were instructed to try over the counter ibuprofen or tylenol, only take the percocet for pain not controlled with the over the counter medication.  Qty: 12 Tab, Refills: 0

## 2013-04-04 NOTE — ED Notes (Signed)
"  I have pain going down my left leg and I was seen here last night for it and the medication they gave isn't working."

## 2013-04-04 NOTE — ED Notes (Signed)
Patient states he has had pain in left thigh beginning Friday, now radiating down back of left leg to foot. Denies trauma, denies injury.

## 2013-04-04 NOTE — ED Provider Notes (Signed)
I was personally available for consultation in the emergency department.  I have reviewed the chart prior to the patient being discharged and agree with the documentation recorded by the MLP, including the assessment, treatment plan, and disposition.  Orrin Yurkovich C. Sharika Mosquera, DO

## 2013-04-04 NOTE — ED Notes (Signed)
I have reviewed discharge instructions with the patient.  The patient verbalized understanding.  Patient armband removed and shredded

## 2013-04-08 ENCOUNTER — Inpatient Hospital Stay
Admit: 2013-04-08 | Discharge: 2013-04-08 | Disposition: A | Discharge status: Home / Self Care | Payer: BLUE CROSS/BLUE SHIELD | Attending: Emergency Medicine

## 2013-04-08 LAB — METABOLIC PANEL, BASIC
Anion gap: 7 mmol/L (ref 3.0–18)
BUN/Creatinine ratio: 9 — ABNORMAL LOW (ref 12–20)
BUN: 9 MG/DL (ref 7.0–18)
CO2: 27 mmol/L (ref 21–32)
Calcium: 8.3 MG/DL — ABNORMAL LOW (ref 8.5–10.1)
Chloride: 105 mmol/L (ref 100–108)
Creatinine: 0.99 MG/DL (ref 0.6–1.3)
GFR est AA: >60 mL/min/{1.73_m2} (ref >60)
GFR est non-AA: >60 mL/min/{1.73_m2} (ref >60)
Glucose: 102 mg/dL — ABNORMAL HIGH (ref 74–99)
Potassium: 4.1 mmol/L (ref 3.5–5.5)
Sodium: 139 mmol/L (ref 136–145)

## 2013-04-08 LAB — CBC WITH AUTOMATED DIFF
ABS. BASOPHILS: 0 10*3/uL (ref 0.0–0.06)
ABS. EOSINOPHILS: 0 10*3/uL (ref 0.0–0.4)
ABS. LYMPHOCYTES: 1.7 10*3/uL (ref 0.9–3.6)
ABS. MONOCYTES: 0.8 10*3/uL (ref 0.05–1.2)
ABS. NEUTROPHILS: 2.1 10*3/uL (ref 1.8–8.0)
BASOPHILS: 0 % (ref 0–2)
EOSINOPHILS: 0 % (ref 0–5)
HCT: 43.2 % (ref 36.0–48.0)
HGB: 15.2 g/dL (ref 13.0–16.0)
LYMPHOCYTES: 36 % (ref 21–52)
MCH: 31.4 PG (ref 24.0–34.0)
MCHC: 35.2 g/dL (ref 31.0–37.0)
MCV: 89.3 FL (ref 74.0–97.0)
MONOCYTES: 18 % — ABNORMAL HIGH (ref 3–10)
MPV: 10.6 FL (ref 9.2–11.8)
NEUTROPHILS: 46 % (ref 40–73)
PLATELET: 139 10*3/uL (ref 135–420)
RBC: 4.84 M/uL (ref 4.70–5.50)
RDW: 12.6 % (ref 11.6–14.5)
WBC: 4.6 10*3/uL (ref 4.6–13.2)

## 2013-04-08 MED ORDER — DIPHENHYDRAMINE HCL 50 MG/ML IJ SOLN
50 mg/mL | Freq: Once | INTRAMUSCULAR | Status: AC
Start: 2013-04-08 — End: 2013-04-08
  Administered 2013-04-08: 13:00:00 via INTRAVENOUS

## 2013-04-08 MED ORDER — HYDROMORPHONE (PF) 1 MG/ML IJ SOLN
1 mg/mL | Freq: Once | INTRAMUSCULAR | Status: AC
Start: 2013-04-08 — End: 2013-04-08
  Administered 2013-04-08: 11:00:00 via INTRAVENOUS

## 2013-04-08 MED ORDER — DIAZEPAM 5 MG/ML SYRINGE
5 mg/mL | INTRAMUSCULAR | Status: AC
Start: 2013-04-08 — End: 2013-04-08
  Administered 2013-04-08: 11:00:00 via INTRAVENOUS

## 2013-04-08 MED ORDER — OXYCODONE-ACETAMINOPHEN 5 MG-325 MG TAB
5-325 mg | ORAL_TABLET | ORAL | Status: DC
Start: 2013-04-08 — End: 2013-04-11

## 2013-04-08 MED ORDER — KETOROLAC TROMETHAMINE 30 MG/ML INJECTION
30 mg/mL (1 mL) | INTRAMUSCULAR | Status: AC
Start: 2013-04-08 — End: 2013-04-08
  Administered 2013-04-08: 11:00:00 via INTRAVENOUS

## 2013-04-08 MED ORDER — ONDANSETRON (PF) 4 MG/2 ML INJECTION
4 mg/2 mL | INTRAMUSCULAR | Status: DC
Start: 2013-04-08 — End: 2013-04-08

## 2013-04-08 MED ORDER — DEXAMETHASONE SODIUM PHOSPHATE 4 MG/ML IJ SOLN
4 mg/mL | INTRAMUSCULAR | Status: AC
Start: 2013-04-08 — End: 2013-04-08
  Administered 2013-04-08: 11:00:00 via INTRAVENOUS

## 2013-04-08 NOTE — ED Notes (Signed)
Assumed pt. Care from [place], [place] - pt. Back from MRI, RN to room pt. resting on stretcher with significant other sleeping at this time on monitor, VSS, pt. Stable will continue to monitor.

## 2013-04-08 NOTE — ED Notes (Addendum)
PT. To CT.

## 2013-04-08 NOTE — ED Notes (Signed)
Discharge instructions reviewed with pt- pt verbalized understanding - pt stable ambulatory with crutches upon discharge with family member.

## 2013-04-08 NOTE — ED Notes (Signed)
PT. Removed monitor, resting in bed at this time stable will continue to monitor.

## 2013-04-08 NOTE — ED Provider Notes (Addendum)
HPI Comments: 32 yo M with hx of asthma and sciatica presents to the ED c/o LLE pain x 6 days.  Pt states that the pain starts at the top of his left buttock and radiates down to his left foot.  Pt states that he now has numbness to the back of his left calf and left foot.  Pt has been seen at this facility in the past for the same pain and Dx with sciatica.  Pt has MRI scheduled by Dr. Colon Branch, ortho, on Jan 6th.  Pt states that he did fall out of the tub last night d/t left foot numbness and not being able to ambulate d/t pain.Pt is followed at [facility].  Pt last drank ETOH at 1700 yesterday and last took steroids at 1200 noon yesterday.  Pt denies back pain, recent injury or trauma, SOB, weakness, fever, chills, ABD pain, CP, N/V/D, and any other sx or complaints.       Past Medical History   Diagnosis Date   ??? Asthma    ??? Sciatica         History reviewed. No pertinent past surgical history.      History reviewed. No pertinent family history.     History     Social History   ??? Marital Status: LEGALLY SEPARATED     Spouse Name: N/A     Number of Children: N/A   ??? Years of Education: N/A     Occupational History   ??? Not on file.     Social History Main Topics   ??? Smoking status: Former Smoker -- 0.50 packs/day   ??? Smokeless tobacco: Never Used   ??? Alcohol Use: Yes      Comment: occasional   ??? Drug Use: No   ??? Sexually Active: Not on file     Other Topics Concern   ??? Not on file     Social History Narrative   ??? No narrative on file                  ALLERGIES: Review of patient's allergies indicates no known allergies.      Review of Systems   Constitutional: Negative.  Negative for fever, chills and diaphoresis.   HENT: Negative.  Negative for ear pain, congestion, sore throat, trouble swallowing and neck pain.    Eyes: Negative.  Negative for photophobia, pain, redness and visual disturbance.   Respiratory: Negative.  Negative for cough, chest tightness, shortness of breath and wheezing.    Cardiovascular:  Negative.  Negative for chest pain and palpitations.   Gastrointestinal: Negative.  Negative for nausea, vomiting, abdominal pain, diarrhea and blood in stool.   Genitourinary: Negative for dysuria and frequency.   Musculoskeletal: Negative.  Negative for back pain and joint swelling.        (+) LLE pain   Skin: Negative.    Neurological: Positive for numbness (left back of calf and left foot). Negative for seizures, syncope and headaches.   Psychiatric/Behavioral: Negative.  Negative for behavioral problems and confusion. The patient is not nervous/anxious.    All other systems reviewed and are negative.        Filed Vitals:    04/08/13 0455   BP: 154/107   Pulse: 114   Temp: 97.9 ??F (36.6 ??C)   Resp: 24   Height: 5\' 5"  (1.651 m)   Weight: 68.04 kg (150 lb)   SpO2: 98%  Physical Exam   Nursing note and vitals reviewed.  Constitutional: He is oriented to person, place, and time. He appears well-developed and well-nourished. No distress.   HENT:   Head: Normocephalic and atraumatic.   Mouth/Throat: Oropharynx is clear and moist.   Eyes: Conjunctivae and EOM are normal. Pupils are equal, round, and reactive to light. No scleral icterus.   Neck: Normal range of motion. Neck supple.   Cardiovascular: Normal rate, regular rhythm and normal heart sounds.    No murmur heard.  Pulmonary/Chest: Effort normal and breath sounds normal. No respiratory distress.   Abdominal: Soft. Bowel sounds are normal. He exhibits no distension. There is no tenderness.   Musculoskeletal: He exhibits no edema.        Lumbar back: He exhibits tenderness (L4/L5).   Sciatic nerve tenderness.    Lymphadenopathy:     He has no cervical adenopathy.   Neurological: He is alert and oriented to person, place, and time. A sensory deficit (decreased sensation to left calf) is present. Coordination normal.   Skin: Skin is warm and dry. No rash noted.   Psychiatric: He has a normal mood and affect. His behavior is normal.        MDM      Differential Diagnosis; Clinical Impression; Plan:     Acute sciatica with increased L lower numbness and c/o decreased dorsiflexion will obtain MRI r/o acute spinal lesion     Pt given dilaudid, valium and toradol for pain care turned over Dr Clinton Sawyer for further evaluation and disposition     10:32  Head ct and mri of back unremarkable will discharge to life coach.       Amount and/or Complexity of Data Reviewed:   Tests in the radiology section of CPT??:  Ordered  Critical Care:   Total time providing critical care:  75-105 minutes (Excluding all other billable procedures.)      Procedures    -------------------------------------------------------------------------------------------------------------------     EKG INTERPRETATIONS:  None      LAB RESULTS:   No results found for this or any previous visit (from the past 8 hour(s)).    RADIOLOGY RESULTS:  MRI LUMB SPINE WO CONT    (Results Pending)       ORDERS:  Orders Placed This Encounter   ??? MRI LUMB SPINE WO CONT     Standing Status: Standing      Number of Occurrences: 1      Standing Expiration Date:      Order Specific Question:  Transport     Answer:  Doctor, general practice [[id number]]     Order Specific Question:  Reason for Exam     Answer:  L sciatic pain with foot drop     Order Specific Question:  Is Patient Allergic to Contrast Dye?     Answer:  No   ??? CBC WITH AUTOMATED DIFF     Standing Status: Standing      Number of Occurrences: 1      Standing Expiration Date:    ??? METABOLIC PANEL, BASIC     Standing Status: Standing      Number of Occurrences: 1      Standing Expiration Date:    ??? dexamethasone (DECADRON) 4 mg/mL injection 4 mg     Sig:        CONSULTATIONS:      PROGRESS NOTES:  5:11 AM:  Dr. Burtis Junes, MD answered the patient's questions regarding treatment.  Scribe Attestation:  written ZO:[email or web address] Horton, (5:23 AM) scribing for and in the presence of Dr.Ilene Johnette Abraham, MD  ED Provider (5:23 AM).    Provider Attestation:   I personally performed the  services described in the documentation, reviewed the documentation, as recorded by the scribe in my presence, and it accurately and completely records my words and actions.   Dr. Burtis Junes, MD ED Provider     -------------------------------------------------------------------------------------------------------------------

## 2013-04-08 NOTE — ED Notes (Signed)
PT. Back from CT, resting in bed at this time stable will continue to monitor.

## 2013-04-08 NOTE — ED Notes (Signed)
Pt states.. (L) leg pain, sensation change (numbness), since last week.. Seen here earlier this week for same s/s. Followed up with ortho.. Scheduled for mri on 1/6th. Pt restless, yelling and moaning.

## 2013-04-11 MED ORDER — OXYCODONE-ACETAMINOPHEN 7.5 MG-325 MG TAB
ORAL_TABLET | ORAL | Status: DC | PRN
Start: 2013-04-11 — End: 2013-04-16

## 2013-04-11 NOTE — ED Notes (Signed)
"  I have sciatic pain down my left leg again. We have been to the orthopedic doctor and the neurologist will be on the 6th of January. We had a MRI and CT here in the ER and they said they couldn't see anything.""

## 2013-04-11 NOTE — ED Notes (Signed)
Pain in left SI joint with left leg radiation since 12.21.14. Seen here 12.22.14 and given pain meds. Came back again 12.26.14 and had head CT and lumbar MRI that were negative. Was seen by ortho last week and told nothing wrong. Has schedule for neurosurgery jan. 6, 14 and has run out of percocet. Pain is the same; came here for percocet prescription. On exam pms normal

## 2013-04-11 NOTE — ED Provider Notes (Signed)
HPI Comments: William Griffin. is a 32 y.o. male with a History of asthma, sciatica who presents to the emergency department with c/o back pain.  Onset: a week and a half  Location: left buttock shooting down to left leg  Quality: sharp, shooting  Severity: moderate  Timing: constant  modifying factors: none  Significant other states Pt has been seen for this multiple times. Pt has been evaluated in ED and by Orthopaedic doctor, had MRI and CT's which were negative. States Pt is still having sharp pains and has an appointment with Neurologist for January 6th but ran out of the Percocet he was taking. Pt denies any incontinence. Patient has no other complaints or medical concerns at this time.           Patient is a 32 y.o. male presenting with back pain.   Back Pain   Pertinent negatives include no chest pain, no fever, no headaches and no abdominal pain.      Past Medical History   Diagnosis Date   ??? Asthma    ??? Sciatica         History reviewed. No pertinent past surgical history.      History reviewed. No pertinent family history.     History     Social History   ??? Marital Status: LEGALLY SEPARATED     Spouse Name: N/A     Number of Children: N/A   ??? Years of Education: N/A     Occupational History   ??? Not on file.     Social History Main Topics   ??? Smoking status: Former Smoker -- 0.50 packs/day   ??? Smokeless tobacco: Never Used   ??? Alcohol Use: Yes      Comment: occasional   ??? Drug Use: No   ??? Sexually Active: Not on file     Other Topics Concern   ??? Not on file     Social History Narrative   ??? No narrative on file        ALLERGIES: Review of patient's allergies indicates no known allergies.      Review of Systems   Constitutional: Negative for fever and chills.   HENT: Negative for congestion and sore throat.    Eyes: Negative for redness and visual disturbance.   Respiratory: Negative for shortness of breath.    Cardiovascular: Negative for chest pain.   Gastrointestinal: Negative for vomiting, abdominal  pain and diarrhea.   Genitourinary: Negative for difficulty urinating.   Musculoskeletal: Positive for myalgias and back pain. Negative for arthralgias.   Skin: Negative for rash.   Neurological: Negative for headaches.   Psychiatric/Behavioral: Negative for dysphoric mood.   All other systems reviewed and are negative.      Filed Vitals:    04/11/13 1437   BP: 146/102   Pulse: 101   Temp: 98.2 ??F (36.8 ??C)   Resp: 18   SpO2: 100%            Physical Exam   Nursing note and vitals reviewed.  Constitutional: He is oriented to person, place, and time. He appears well-developed and well-nourished. No distress.   HENT:   Head: Normocephalic and atraumatic.   Mouth/Throat: Oropharynx is clear and moist.   Eyes: Conjunctivae and EOM are normal. Pupils are equal, round, and reactive to light. No scleral icterus.   Neck: Normal range of motion. Neck supple.   Cardiovascular: Normal rate, regular rhythm and normal heart sounds.  No murmur heard.  Pulmonary/Chest: Effort normal and breath sounds normal. No respiratory distress.   Abdominal: Soft. Bowel sounds are normal. He exhibits no distension. There is no tenderness.   Musculoskeletal: He exhibits no edema.   good reflexes bilaterally to knee. normal sensation to pin prick. positive straight leg raise on left. positive tenderness to left SI joint.    Lymphadenopathy:     He has no cervical adenopathy.   Neurological: He is alert and oriented to person, place, and time. Coordination normal.   Skin: Skin is warm and dry. No rash noted.   Psychiatric: He has a normal mood and affect. His behavior is normal.        MDM    Procedures    -------------------------------------------------------------------------------------------------------------------     EKG INTERPRETATIONS:  none    RADIOLOGY RESULTS:    none    LABORATORY RESULTS:  No results found for this or any previous visit (from the past 12 hour(s)).    ED Objective Order Summary:     Orders Placed This Encounter   ???  oxyCODONE-acetaminophen (PERCOCET) 7.5-325 mg per tablet     Sig: Take 1 tablet by mouth every four (4) hours as needed for Pain.     Dispense:  30 tablet     Refill:  0       CONSULTATIONS:  none    PROGRESS NOTES:  2:30 PM:  Dr. Amalia Greenhouse, MD answered the patient's questions regarding treatment.    DISPOSITION:  ED DIAGNOSIS & DISPOSITION INFORMATION  Diagnosis:   1. Sciatica          Disposition: HOME    Follow-up Information    Follow up With Details Comments Contact Info    [facility]  KEEP YOUR APPOINTMENT FOR JANUARY 6TH WIHOUT FAIL [address]  [address]  [place] [address]  [phone]          Current Discharge Medication List      START taking these medications    Details   oxyCODONE-acetaminophen (PERCOCET) 7.5-325 mg per tablet Take 1 tablet by mouth every four (4) hours as needed for Pain.  Qty: 30 tablet, Refills: 0         CONTINUE these medications which have NOT CHANGED    Details   predniSONE (DELTASONE) 20 mg tablet Begin taking tomorrow Tuesday, April 05, 2013 as you received your initial dose while in the ER.    3 tabs po every day x 3 days, then 2 tabs po every day x 3 days, then 1 tab po every day x 3 days.  Qty: 18 tablet, Refills: 0         STOP taking these medications       oxyCODONE-acetaminophen (PERCOCET) 5-325 mg per tablet Comments:   Reason for Stopping:         oxyCODONE-acetaminophen (PERCOCET) 5-325 mg per tablet Comments:   Reason for Stopping:               SCRIBE ATTESTATION STATEMENT:  Documented by: Edward Qualia scribing for and in the presence of Amalia Greenhouse, MD.    PROVIDER ATTESTATION STATEMENT:  I personally performed the services described in the documentation, reviewed the documentation, as recorded by the scribe in my presence, and it accurately and completely records my words and actions Amalia Greenhouse, MD. 9:16 PM

## 2013-04-16 MED ORDER — GABAPENTIN 100 MG CAP
100 mg | ORAL_CAPSULE | Freq: Three times a day (TID) | ORAL | Status: DC
Start: 2013-04-16 — End: 2014-05-04

## 2013-04-16 MED ORDER — OXYCODONE-ACETAMINOPHEN 7.5 MG-325 MG TAB
ORAL_TABLET | ORAL | Status: DC | PRN
Start: 2013-04-16 — End: 2013-08-04

## 2013-04-16 NOTE — ED Notes (Signed)
Pt triaged, examined, and discharged by provider in triage.

## 2013-04-16 NOTE — ED Notes (Signed)
Pt c/o  Right leg pain which is chronic and states that he is out of his pain medication.

## 2013-04-16 NOTE — ED Notes (Signed)
Patient armband removed and given to patient to take home.  Patient was informed of the privacy risks if armband lost or stolen  I have reviewed discharge instructions with the patient.  The patient verbalized understanding.  Pt ambulatory on crutches to waiting room with steady gait and NAD.

## 2013-04-16 NOTE — ED Provider Notes (Signed)
HPI Comments: William Griffin. is a 33 y.o. male with a History of asthma and sciatica presents to the emergency department with c/o leg pain  Onset: chronic  Location: bialteral legs L>R  Quality: sharp  Severity: 9/10  Timing: constant  modifying factors: none  Pt states he  Has been here multiple times for sciatica and has run out of medication. Pt reports he has neurology appointment 04/19/13. Pt denies NVD, fever, or chest pain. Patient has no other complaints or medical concerns at this time.       The history is provided by the patient.        Past Medical History   Diagnosis Date   ??? Asthma    ??? Sciatica         History reviewed. No pertinent past surgical history.      History reviewed. No pertinent family history.     History     Social History   ??? Marital Status: LEGALLY SEPARATED     Spouse Name: N/A     Number of Children: N/A   ??? Years of Education: N/A     Occupational History   ??? Not on file.     Social History Main Topics   ??? Smoking status: Former Smoker -- 0.50 packs/day   ??? Smokeless tobacco: Never Used   ??? Alcohol Use: Yes      Comment: occasional   ??? Drug Use: No   ??? Sexually Active: Not on file     Other Topics Concern   ??? Not on file     Social History Narrative   ??? No narrative on file                  ALLERGIES: Review of patient's allergies indicates no known allergies.      Review of Systems   Constitutional: Negative for fever, chills, diaphoresis and unexpected weight change.   HENT: Negative for ear pain, congestion, sore throat, rhinorrhea, drooling, trouble swallowing, neck pain and tinnitus.    Eyes: Negative for photophobia, pain, redness and visual disturbance.   Respiratory: Negative for cough, choking, chest tightness, shortness of breath, wheezing and stridor.    Cardiovascular: Negative for chest pain, palpitations and leg swelling.   Gastrointestinal: Negative for nausea, vomiting, abdominal pain, diarrhea, constipation, blood in stool, abdominal distention and anal  bleeding.   Endocrine: Negative for cold intolerance, heat intolerance, polydipsia and polyuria.   Genitourinary: Negative for dysuria, urgency, frequency, hematuria, flank pain and difficulty urinating.   Musculoskeletal: Positive for arthralgias (bilateral legs). Negative for back pain.   Skin: Negative for color change, rash and wound.   Allergic/Immunologic: Negative for immunocompromised state.   Neurological: Positive for numbness (left foot). Negative for dizziness, seizures, syncope, speech difficulty, light-headedness and headaches.   Hematological: Does not bruise/bleed easily.   Psychiatric/Behavioral: Negative for suicidal ideas, hallucinations, behavioral problems, self-injury and agitation. The patient is not hyperactive.    All other systems reviewed and are negative.        There were no vitals filed for this visit.         Physical Exam   Nursing note and vitals reviewed.  Constitutional: He is oriented to person, place, and time. He appears well-developed and well-nourished. No distress.   HENT:   Head: Normocephalic and atraumatic.   Mouth/Throat: Oropharynx is clear and moist.   Eyes: Conjunctivae and EOM are normal. Pupils are equal, round, and reactive to light. No scleral icterus.  Neck: Normal range of motion. Neck supple.   Cardiovascular: Regular rhythm, normal heart sounds, intact distal pulses and normal pulses.  Tachycardia present.    No murmur heard.  Elevated BP   Pulmonary/Chest: Effort normal and breath sounds normal. No respiratory distress.   Abdominal: Soft. Bowel sounds are normal. He exhibits no distension. There is no tenderness.   Musculoskeletal: He exhibits no edema.        Lumbar back: He exhibits pain (radiating to left leg). He exhibits no tenderness and no bony tenderness.        Left upper leg: He exhibits tenderness.   Lymphadenopathy:     He has no cervical adenopathy.   Neurological: He is alert and oriented to person, place, and time. Coordination normal.   Skin:  Skin is warm and dry. No rash noted.   Psychiatric: He has a normal mood and affect. His behavior is normal.        MDM     Differential Diagnosis; Clinical Impression; Plan:     Sciatica, refill meds orhto fu son, no more narcotuics in ed counseled for pain management and fu   Amount and/or Complexity of Data Reviewed:   Discussion of test results with the performing providers:  No   Decide to obtain previous medical records or to obtain history from someone other than the patient:  No   Obtain history from someone other than the patient:  No   Review and summarize past medical records:  Yes   Discuss the patient with another provider:  No   Independant visualization of image, tracing, or specimen:  No      Procedures    PROGRESS NOTES  5:53 PM: Emmylou Bieker Marlene Lard Marcee Jacobs, MD evaluates the patient. Answered the patient's questions regarding the treatment plan. Advised pt he will not be able to get refills from ED again due to appointment with Neuro on 1/6. Will discharge patient home for outpatient treatment with instruction to follow up with Pain Management. Patient is ready to go home.      ED MEDICATIONS  Medications - No data to display    ED ORDERS  Orders Placed This Encounter   ??? oxyCODONE-acetaminophen (PERCOCET) 7.5-325 mg per tablet     Sig: Take 1 tablet by mouth every four (4) hours as needed for Pain.     Dispense:  30 tablet     Refill:  0   ??? gabapentin (NEURONTIN) 100 mg capsule     Sig: Take 1 capsule by mouth three (3) times daily.     Dispense:  90 capsule     Refill:  0       ED DIAGNOSIS & DISPOSITIONS  Diagnosis:   1. Sciatica        Disposition: Discharge    Follow-up Information    Follow up With Details Comments Contact Info    [facility] Call  [address], [address]  [place] [id number]  [phone]          Current Discharge Medication List      START taking these medications    Details   gabapentin (NEURONTIN) 100 mg capsule Take 1 capsule by  mouth three (3) times daily.  Qty: 90 capsule, Refills: 0         CONTINUE these medications which have CHANGED    Details   oxyCODONE-acetaminophen (PERCOCET) 7.5-325 mg per tablet Take 1 tablet by mouth every four (4) hours  as needed for Pain.  Qty: 30 tablet, Refills: 0         STOP taking these medications       predniSONE (DELTASONE) 20 mg tablet Comments:   Reason for Stopping:                 SCRIBE ATTESTATION STATEMENT  Documented by Laqueta Linden, scribing for and in the presence of Artha Stavros D Elliot Meldrum, MD. 5:53 PM      PROVIDER ATTESTATION STATEMENT  I, Trany Chernick D Jermon Chalfant, MD, personally performed the services described in the documentation, reviewed the documentation as recorded by the scribe in my presence, and it accurately and completely records my words and actions. (6:02 PM)

## 2013-07-14 ENCOUNTER — Encounter

## 2013-07-20 LAB — CBC WITH AUTOMATED DIFF
ABS. BASOPHILS: 0 10*3/uL (ref 0.0–0.06)
ABS. EOSINOPHILS: 0.1 10*3/uL (ref 0.0–0.4)
ABS. LYMPHOCYTES: 1.2 10*3/uL (ref 0.9–3.6)
ABS. MONOCYTES: 0.6 10*3/uL (ref 0.05–1.2)
ABS. NEUTROPHILS: 2.2 10*3/uL (ref 1.8–8.0)
BASOPHILS: 0 % (ref 0–2)
EOSINOPHILS: 1 % (ref 0–5)
HCT: 38.7 % (ref 36.0–48.0)
HGB: 13.5 g/dL (ref 13.0–16.0)
LYMPHOCYTES: 29 % (ref 21–52)
MCH: 32.1 PG (ref 24.0–34.0)
MCHC: 34.9 g/dL (ref 31.0–37.0)
MCV: 92.1 FL (ref 74.0–97.0)
MONOCYTES: 14 % — ABNORMAL HIGH (ref 3–10)
MPV: 10.8 FL (ref 9.2–11.8)
NEUTROPHILS: 56 % (ref 40–73)
PLATELET: 197 10*3/uL (ref 135–420)
RBC: 4.2 M/uL — ABNORMAL LOW (ref 4.70–5.50)
RDW: 13.3 % (ref 11.6–14.5)
WBC: 4.1 10*3/uL — ABNORMAL LOW (ref 4.6–13.2)

## 2013-07-20 LAB — METABOLIC PANEL, BASIC
Anion gap: 6 mmol/L (ref 3.0–18)
BUN/Creatinine ratio: 9 — ABNORMAL LOW (ref 12–20)
BUN: 7 MG/DL (ref 7.0–18)
CO2: 26 mmol/L (ref 21–32)
Calcium: 9 MG/DL (ref 8.5–10.1)
Chloride: 105 mmol/L (ref 100–108)
Creatinine: 0.78 MG/DL (ref 0.6–1.3)
GFR est AA: >60 mL/min/{1.73_m2} (ref >60)
GFR est non-AA: >60 mL/min/{1.73_m2} (ref >60)
Glucose: 99 mg/dL (ref 74–99)
Potassium: 4.1 mmol/L (ref 3.5–5.5)
Sodium: 137 mmol/L (ref 136–145)

## 2013-07-20 LAB — CARDIAC PANEL,(CK, CKMB & TROPONIN)
CK - MB: 0.9 ng/ml (ref 0.5–3.6)
CK-MB Index: 1.1 % (ref 0.0–4.0)
CK: 81 U/L (ref 39–308)
Troponin-I, QT: <0.02 NG/ML (ref 0.0–0.045)

## 2013-07-20 MED ORDER — NITROGLYCERIN 0.4 MG SUBLINGUAL TAB
0.4 mg | SUBLINGUAL | Status: DC | PRN
Start: 2013-07-20 — End: 2013-07-20
  Administered 2013-07-20: 11:00:00 via SUBLINGUAL

## 2013-07-20 MED ORDER — ASPIRIN 81 MG CHEWABLE TAB
81 mg | ORAL | Status: AC
Start: 2013-07-20 — End: 2013-07-20
  Administered 2013-07-20: 11:00:00 via ORAL

## 2013-07-20 MED FILL — NITROSTAT 0.4 MG SUBLINGUAL TABLET: 0.4 mg | SUBLINGUAL | Qty: 1

## 2013-07-20 MED FILL — ASPIRIN 81 MG CHEWABLE TAB: 81 mg | ORAL | Qty: 4

## 2013-07-20 NOTE — ED Notes (Signed)
Pt left via ambulatory with visitor. No acute distress noted.

## 2013-07-20 NOTE — ED Notes (Signed)
Pt ambulated to the bathroom. Gait steady. Urine obtained.

## 2013-07-20 NOTE — ED Notes (Signed)
Verbal shift change report given to Alona BeneJoyce, Charity fundraiserN (oncoming nurse) by Pearlean BrownieBasha, RN (offgoing nurse). Report included the following information SBAR.  Pt back from XR via wheelchair. Placed on continuous cardiac monitor and pulse ox. Pt c/o headache and L arm pain 8/10. Denies any chest pain at this time.

## 2013-07-20 NOTE — ED Notes (Signed)
Pt began yelling [facility] Center[facility]ce, [place] and then Dr Kristeen Mansews. Pt asked by Md to quiet his voice. Pt continued to yell and demand to be unhooked. Security called. Pt unhooked from monitoring devices as demanded.

## 2013-07-20 NOTE — ED Provider Notes (Signed)
Patient is a 33 y.o. male presenting with arm pain. The history is provided by the patient.   Arm Pain   Associated symptoms include numbness.      Pt woke this morning at 3:30a w/left elbow and left sided chest tightness.  C/o numbness sensation distal to his elbow which worsens for pain w/flexion.  Pt took a gabapentin and Percocet (on short-term disability for sciatica and has these Rx meds) and there was no improvement.  Denies h/o HTN, DM, hypercholesterolemia, FAM h/o CAD, daily ASA, prior stress test or echocardiogram, illicits, recent trauma or exertional efforts, recent travel/surgery/fxs, prior PE or DVT, unilateral leg swelling or pain.    Smokes 1 cigar nightly; drinks 3 beers nightly.    Past Medical History   Diagnosis Date   ??? Asthma    ??? Sciatica         History reviewed. No pertinent past surgical history.      History reviewed. No pertinent family history.     History     Social History   ??? Marital Status: SINGLE     Spouse Name: N/A     Number of Children: N/A   ??? Years of Education: N/A     Occupational History   ??? Not on file.     Social History Main Topics   ??? Smoking status: Current Every Day Smoker -- 0.50 packs/day   ??? Smokeless tobacco: Never Used   ??? Alcohol Use: Not on file      Comment: occasional   ??? Drug Use: No   ??? Sexual Activity: Not on file     Other Topics Concern   ??? Not on file     Social History Narrative                  ALLERGIES: Review of patient's allergies indicates no known allergies.      Review of Systems   Constitutional: Negative for fever and fatigue.   Respiratory: Negative for cough and shortness of breath.    Cardiovascular: Positive for chest pain and palpitations.   Musculoskeletal: Positive for arthralgias. Negative for myalgias and gait problem.   Neurological: Positive for numbness. Negative for weakness.   All other systems reviewed and are negative.      Filed Vitals:    07/20/13 0700 07/20/13 0730 07/20/13 0745 07/20/13 0748   BP: 141/90 143/88 137/96     Pulse: 103 97 92 89   Temp:       Resp: 15 14 12 13    SpO2: 97% 99% 100% 98%            Physical Exam   Constitutional: Vital signs are normal. He appears well-developed and well-nourished. He is active.  Non-toxic appearance. He does not appear ill. No distress.   HENT:   Head: Normocephalic and atraumatic.   Neck: Normal range of motion. Neck supple. Carotid bruit is not present. No tracheal deviation present. No thyromegaly present.   Cardiovascular: Normal rate, regular rhythm, normal heart sounds and intact distal pulses.  Exam reveals no gallop and no friction rub.    No murmur heard.  Equal radial pulses   Pulmonary/Chest: Effort normal and breath sounds normal. No stridor. No respiratory distress. He has no wheezes. He has no rales. He exhibits no tenderness.   Abdominal: Soft. He exhibits no distension and no mass. There is no tenderness. There is no rebound, no guarding and no CVA tenderness.   Musculoskeletal: Normal range of  motion. He exhibits tenderness.   LUE: TTP of the dorsal elbow; no wrist or shoulder joint TTP.  Radial pulse palpable.  FROM.  Sensation intact distally.  Hand strength 5/5.   Neurological: He is alert.   Skin: Skin is warm, dry and intact. He is not diaphoretic. No pallor.   Psychiatric: He has a normal mood and affect. His speech is normal and behavior is normal. Judgment and thought content normal.   Nursing note and vitals reviewed.       MDM  Number of Diagnoses or Management Options  Chest pain:   Numbness:   Diagnosis management comments: Differential: radial n. Palsy; joint effusion; angina; NSTEMI    EKG shows NSR with rate of 98 bpm.  Normal axis and PR interval w/o QRS widening or ST segment changes.    Pt started yelling at nurse and removed his IV; pt eloped.  Called him on his cell phone and said he needed to leave and didn't want security involved.  Likely radial n. Palsy.       Amount and/or Complexity of Data Reviewed  Clinical lab tests: ordered and reviewed   Tests in the radiology section of CPT??: ordered and reviewed        Procedures    9:14 AM  Diagnosis:   1. Chest pain    2. Numbness          Disposition: eloped    Follow-up Information    None          Discharge Medication List as of 07/20/2013  9:14 AM      CONTINUE these medications which have NOT CHANGED    Details   oxyCODONE-acetaminophen (PERCOCET) 7.5-325 mg per tablet Take 1 tablet by mouth every four (4) hours as needed for Pain., Print, Disp-30 tablet, R-0      gabapentin (NEURONTIN) 100 mg capsule Take 1 capsule by mouth three (3) times daily., Print, Disp-90 capsule, R-0

## 2013-07-20 NOTE — ED Notes (Signed)
Pain in chest rated 6/10, elbow 8/10. Second nitro given.

## 2013-07-20 NOTE — ED Notes (Signed)
Pain rated 6/10 in chest and 8/10 in R arm.

## 2013-07-21 LAB — EKG, 12 LEAD, INITIAL
Atrial Rate: 98 {beats}/min
Calculated P Axis: 56 degrees
Calculated R Axis: 45 degrees
Calculated T Axis: 33 degrees
P-R Interval: 136 ms
Q-T Interval: 328 ms
QRS Duration: 86 ms
QTC Calculation (Bezet): 418 ms
Ventricular Rate: 98 {beats}/min

## 2013-08-04 NOTE — ED Notes (Signed)
Pt states Latoya, sister, is going to drive him home.

## 2013-08-04 NOTE — ED Notes (Signed)
Patient reports left arm pain, in the forearm, extends to 4th and 5th finger, for a few weeks, patient denies injury.  Patient reports lower left leg pain, sharp shooting pains, he has seen neurology for these pains before.  Patient reports taking gabapentin earlier today, he said it helped a little but not completely.     Patient is suppose to see his neurologist tomorrow.

## 2013-08-04 NOTE — ED Provider Notes (Addendum)
HPI Comments: William Griffin is a 33 year old male with a PMHx Asthma and Sciatica presents to ER c/o numbness and tingling sensation to bilateral hands and feet.  Onset of symptoms started last night.  Patient reports he has been taking over the counter "[employer] Powder" with no relief.  Patient states, "at times I get this pain in my left forearm and shoots to my 4th and 5th finger."  Patient reports he is scheduled to see his neurologist tomorrow.  Patient denies slurred speech, change in mental status, dizziness, fatigue, headache, CP,SOB.  Patient reports he has been tolerating po fluids and solids.  Denies fever, chills, tinnitus, abdominal pain, N/V/D, leg swelling/calf or any other concerns.     Patient is a 33 y.o. male presenting with arm pain and leg pain. The history is provided by the patient.   Arm Pain   Associated symptoms include numbness (bilateral hands and feet).   Leg Pain   Associated symptoms include numbness (bilateral hands and feet).        Past Medical History   Diagnosis Date   ??? Asthma    ??? Sciatica         History reviewed. No pertinent past surgical history.      History reviewed. No pertinent family history.     History     Social History   ??? Marital Status: SINGLE     Spouse Name: N/A     Number of Children: N/A   ??? Years of Education: N/A     Occupational History   ??? Not on file.     Social History Main Topics   ??? Smoking status: Current Every Day Smoker -- 0.50 packs/day   ??? Smokeless tobacco: Never Used   ??? Alcohol Use: Not on file      Comment: occasional   ??? Drug Use: No   ??? Sexual Activity: Not on file     Other Topics Concern   ??? Not on file     Social History Narrative                  ALLERGIES: Review of patient's allergies indicates no known allergies.      Review of Systems   Constitutional: Negative.  Negative for fever, chills, diaphoresis, activity change, appetite change and fatigue.   HENT: Negative.    Eyes: Negative.    Respiratory: Negative.  Negative for cough  and shortness of breath.    Cardiovascular: Negative.  Negative for chest pain, palpitations and leg swelling.   Gastrointestinal: Negative.  Negative for nausea, vomiting, abdominal pain, diarrhea, constipation, blood in stool and abdominal distention.   Genitourinary: Negative.    Musculoskeletal: Negative.    Skin: Negative.    Neurological: Positive for numbness (bilateral hands and feet). Negative for dizziness, tremors, seizures, syncope, facial asymmetry, speech difficulty, weakness, light-headedness and headaches.       Filed Vitals:    08/04/13 2046 08/04/13 2047   BP:  151/108   Pulse: 105    Temp: 98.5 ??F (36.9 ??C)    Resp: 18    Height: 5\' 5"  (1.651 m)    Weight: 68.04 kg (150 lb)    SpO2: 100%             Physical Exam   Constitutional: He is oriented to person, place, and time. He appears well-developed and well-nourished. No distress.   HENT:   Right Ear: Hearing, tympanic membrane, external ear and ear  canal normal.   Left Ear: Hearing, tympanic membrane, external ear and ear canal normal.   Nose: Nose normal.   Mouth/Throat: Uvula is midline, oropharynx is clear and moist and mucous membranes are normal. No oropharyngeal exudate.   Neck: Normal range of motion and full passive range of motion without pain. Neck supple. No spinous process tenderness and no muscular tenderness present. No rigidity. No edema, no erythema and normal range of motion present.   Cardiovascular: Normal rate, regular rhythm, normal heart sounds and intact distal pulses.  Exam reveals no gallop and no friction rub.    No murmur heard.  Pulmonary/Chest: Effort normal and breath sounds normal. No respiratory distress. He has no wheezes. He has no rales. He exhibits no tenderness.   Abdominal: Soft. Normal appearance and bowel sounds are normal. He exhibits no shifting dullness. There is no rigidity, no rebound, no guarding, no CVA tenderness, no tenderness at McBurney's point and negative Murphy's sign.   Musculoskeletal:  Normal range of motion.        Right hand: Normal. Normal sensation noted. Normal strength noted. He exhibits no finger abduction, no thumb/finger opposition and no wrist extension trouble.        Left hand: Normal. Normal sensation noted. Normal strength noted. He exhibits no finger abduction, no thumb/finger opposition and no wrist extension trouble.        Right foot: Normal.        Left foot: Normal.   Neurological: He is alert and oriented to person, place, and time. He has normal strength and normal reflexes. He displays no atrophy and no tremor. No sensory deficit. He exhibits normal muscle tone. He displays no seizure activity. Coordination normal.   Skin: Skin is warm and dry. He is not diaphoretic.   Psychiatric: He has a normal mood and affect. His speech is normal and behavior is normal. Judgment and thought content normal. Cognition and memory are normal.   Nursing note and vitals reviewed.       MDM  Number of Diagnoses or Management Options  Elevated BP:   Neuropathic pain:   Diagnosis management comments: 2300: Report and care turned over to William Griffin. Standard discussion included reason for patient's ER visit. PMHx, V/S, ROS, and PE.  Lab and EKG pending.    William Rakes, NP         Amount and/or Complexity of Data Reviewed  Clinical lab tests: ordered and reviewed  Review and summarize past medical records: yes (07/20/13--worked up for chest pain and numbness)  Discuss the patient with other providers: (     Pt appears well overall  Afebrile  Vitals are stable.  HR improved.  Note one or more elevated blood pressure readings.    Pain could be contributing factor. Pt  referred to a consultant for further evaluation and treatment.   Physical exam is unremarkable.  No deficits noted    Pt understands follow up and tx plan.  Discussed return precautions.  Pt indicates understanding and agrees. )    Risk of Complications, Morbidity, and/or Mortality  Presenting problems: low  Diagnostic procedures:  moderate  Management options: low        Procedures    Introduced self to pt.  Reviewed HPI.  Pt reports usual neuropathic UE and LE pain, but has run out of Percocet which he takes in conjunction with gabapentin.  Pt notes improvement since given Percocet in ER.  Reviewed labs with pt.  Pt to be  discharged to home    General--sitting in exam bed, in no distress  Cardio--RRR, no MRG  Pulm--faint scatterred wheezes (pt is a smoker)  MS--moves all extremities well.    Equal grip strength  Muscle strength 5/5 with dorsiflexion/extension BLE.      Diagnosis:   1. Neuropathic pain    2. Elevated BP          Disposition: home    Follow-up Information    Follow up With Details Comments Contact Info    Your neurologist  tomorrow as scheduled without fail     [facility]  for any emergent concerns.  [address]  [place] [id number]  [phone]    Ralene MuskratJonah R Flowers, DO Call to schedule primary care for recheck and management of your elevated blood pressure today [address]  [address]  [facility]  [place] [id number]  [phone]            Current Discharge Medication List      START taking these medications    Details   oxyCODONE-acetaminophen (PERCOCET) 5-325 mg per tablet Take 1 Tab by mouth every four (4) hours as needed for Pain. Max Daily Amount: 6 Tabs.  Qty: 6 Tab, Refills: 0         CONTINUE these medications which have NOT CHANGED    Details   gabapentin (NEURONTIN) 100 mg capsule Take 1 capsule by mouth three (3) times daily.  Qty: 90 capsule, Refills: 0

## 2013-08-04 NOTE — ED Notes (Signed)
SBAR report given to Sarah, RN.

## 2013-08-04 NOTE — ED Notes (Signed)
Pt states, "The pain just started kicking real bad in my arm and in my feet." I'm scheduled to see my neurologist tomorrow but I ran out of my medicine. She had me on gabapentin and Percocet. I have gabapentin but I ran out of Percocet." C/o sharp pain 10/10 L arm and L foot. Denies any chest pain/SOB/NVD/fever at this time.

## 2013-08-05 LAB — METABOLIC PANEL, BASIC
Anion gap: 7 mmol/L (ref 3.0–18)
BUN/Creatinine ratio: 7 — ABNORMAL LOW (ref 12–20)
BUN: 7 MG/DL (ref 7.0–18)
CO2: 24 mmol/L (ref 21–32)
Calcium: 8.9 MG/DL (ref 8.5–10.1)
Chloride: 111 mmol/L — ABNORMAL HIGH (ref 100–108)
Creatinine: 1.03 MG/DL (ref 0.6–1.3)
GFR est AA: >60 mL/min/{1.73_m2} (ref >60)
GFR est non-AA: >60 mL/min/{1.73_m2} (ref >60)
Glucose: 96 mg/dL (ref 74–99)
Potassium: 4.1 mmol/L (ref 3.5–5.5)
Sodium: 142 mmol/L (ref 136–145)

## 2013-08-05 LAB — CBC WITH AUTOMATED DIFF
ABS. BASOPHILS: 0 10*3/uL (ref 0.0–0.06)
ABS. EOSINOPHILS: 0.1 10*3/uL (ref 0.0–0.4)
ABS. LYMPHOCYTES: 1.6 10*3/uL (ref 0.9–3.6)
ABS. MONOCYTES: 0.5 10*3/uL (ref 0.05–1.2)
ABS. NEUTROPHILS: 1.5 10*3/uL — ABNORMAL LOW (ref 1.8–8.0)
BASOPHILS: 0 % (ref 0–2)
EOSINOPHILS: 1 % (ref 0–5)
HCT: 37.2 % (ref 36.0–48.0)
HGB: 13 g/dL (ref 13.0–16.0)
LYMPHOCYTES: 45 % (ref 21–52)
MCH: 31.2 PG (ref 24.0–34.0)
MCHC: 34.9 g/dL (ref 31.0–37.0)
MCV: 89.2 FL (ref 74.0–97.0)
MONOCYTES: 13 % — ABNORMAL HIGH (ref 3–10)
MPV: 10.7 FL (ref 9.2–11.8)
NEUTROPHILS: 41 % (ref 40–73)
PLATELET: 271 10*3/uL (ref 135–420)
RBC: 4.17 M/uL — ABNORMAL LOW (ref 4.70–5.50)
RDW: 12.4 % (ref 11.6–14.5)
WBC: 3.7 10*3/uL — ABNORMAL LOW (ref 4.6–13.2)

## 2013-08-05 LAB — EKG, 12 LEAD, INITIAL
Atrial Rate: 84 {beats}/min
Calculated P Axis: 58 degrees
Calculated R Axis: 66 degrees
Calculated T Axis: 33 degrees
P-R Interval: 148 ms
Q-T Interval: 348 ms
QRS Duration: 92 ms
QTC Calculation (Bezet): 411 ms
Ventricular Rate: 84 {beats}/min

## 2013-08-05 MED ORDER — OXYCODONE-ACETAMINOPHEN 5 MG-325 MG TAB
5-325 mg | ORAL | Status: AC
Start: 2013-08-05 — End: 2013-08-04
  Administered 2013-08-05: 03:00:00 via ORAL

## 2013-08-05 MED ORDER — OXYCODONE-ACETAMINOPHEN 5 MG-325 MG TAB
5-325 mg | ORAL_TABLET | ORAL | Status: DC | PRN
Start: 2013-08-05 — End: 2013-11-14

## 2013-08-05 MED FILL — OXYCODONE-ACETAMINOPHEN 5 MG-325 MG TAB: 5-325 mg | ORAL | Qty: 2

## 2013-08-05 NOTE — ED Notes (Signed)
Patient has received discharge instructions.  Patient verbalizes understanding of discharge instructions.  Patient denies further questions.  Patient understands that they may not drive while taking pain medications due to sedation side effects.    Patient armband removed and shredded

## 2013-08-13 MED ORDER — IBUPROFEN 800 MG TAB
800 mg | ORAL_TABLET | Freq: Four times a day (QID) | ORAL | Status: AC | PRN
Start: 2013-08-13 — End: 2013-08-20

## 2013-08-13 MED ORDER — OXYCODONE-ACETAMINOPHEN 5 MG-325 MG TAB
5-325 mg | ORAL_TABLET | ORAL | Status: DC | PRN
Start: 2013-08-13 — End: 2013-11-14

## 2013-08-13 NOTE — ED Notes (Signed)
Patient states that he has been having foot and hand for the past couple of days.  Patient states he last seen his primary on the 18th.

## 2013-08-13 NOTE — ED Provider Notes (Signed)
HPI Comments: William Griffin is a 33 y.o. male with a history of asthma, sciatica who presents to the emergency department with c/o nerve pain to left hand and bilateral feet.  He states he has seen a neurologist on 4/18 and was prescribed neurontin and percocet.  He is out of the percocet and states the neurontin alone does not control the pain.  Pt denies any recent fevers or chills, headaches, dizziness or light headedness, ENT issues, CP or discomfort, SOB, cough, n/v/d/c, abd pain, back pain, dysuria, focal weakness/numbness/tingling, or rash.  Patient has no other complaints at this time.        Patient is a 33 y.o. male presenting with foot pain and hand pain.   Foot Pain   Pertinent negatives include no numbness and no back pain.   Hand Pain   Pertinent negatives include no numbness and no back pain.        Past Medical History   Diagnosis Date   ??? Asthma    ??? Sciatica         No past surgical history on file.      No family history on file.     History     Social History   ??? Marital Status: SINGLE     Spouse Name: N/A     Number of Children: N/A   ??? Years of Education: N/A     Occupational History   ??? Not on file.     Social History Main Topics   ??? Smoking status: Current Every Day Smoker -- 0.50 packs/day   ??? Smokeless tobacco: Never Used   ??? Alcohol Use: Not on file      Comment: occasional   ??? Drug Use: No   ??? Sexual Activity: Not on file     Other Topics Concern   ??? Not on file     Social History Narrative                  ALLERGIES: Review of patient's allergies indicates no known allergies.      Review of Systems   Constitutional: Negative for fever and chills.   HENT: Negative for congestion, rhinorrhea and sinus pressure.    Respiratory: Negative for cough, shortness of breath and wheezing.    Cardiovascular: Positive for leg swelling.   Gastrointestinal: Negative for nausea, vomiting, abdominal pain, diarrhea and constipation.   Genitourinary: Negative for dysuria and hematuria.   Musculoskeletal:  Positive for arthralgias. Negative for myalgias and back pain.   Skin: Negative for rash and wound.   Neurological: Negative for dizziness, weakness, numbness and headaches.   Psychiatric/Behavioral: Negative for hallucinations, behavioral problems and agitation.       Filed Vitals:    08/13/13 1532   BP: 152/104   Pulse: 103   Temp: 98.1 ??F (36.7 ??C)   Resp: 18   Height: 5\' 5"  (1.651 m)   Weight: 68.04 kg (150 lb)            Physical Exam   Constitutional: He is oriented to person, place, and time. He appears well-developed. No distress.   HENT:   Head: Normocephalic and atraumatic.   Nose: Nose normal.   Mouth/Throat: Oropharynx is clear and moist. No oropharyngeal exudate.   Eyes: Conjunctivae and EOM are normal. Pupils are equal, round, and reactive to light. Right eye exhibits no discharge. Left eye exhibits no discharge. No scleral icterus.   Neck: Normal range of motion. Neck supple. No  thyromegaly present.   Cardiovascular: Normal heart sounds and intact distal pulses.  Tachycardia present.    Pulmonary/Chest: Effort normal and breath sounds normal. No respiratory distress. He has no wheezes. He has no rales. He exhibits no tenderness.   Musculoskeletal: Normal range of motion. He exhibits edema. He exhibits no tenderness.   Track marks noted bilateral forearms.  Bilateral ankles and lower legs with pitting edema   Lymphadenopathy:     He has no cervical adenopathy.   Neurological: He is alert and oriented to person, place, and time. No cranial nerve deficit.   Skin: Skin is warm and dry. No rash noted. He is not diaphoretic. No erythema. No pallor.   Psychiatric: He has a normal mood and affect. His behavior is normal. Judgment and thought content normal.   Nursing note and vitals reviewed.       MDM  Number of Diagnoses or Management Options  Chronic foot pain, unspecified laterality: established and worsening  Chronic hand pain, left: established and worsening  Diagnosis management comments: Differential  Diagnosis: malingering, OA, neuropathy, DM, gout, RA    Clinical Impression: chronic foot and hand pain    Plan:  Pt has had 6 narcotic prescriptions in the past 1.5 months.  Will provide refill for 2 percocet.  Pt is advised to follow-up with neurologist as I cannot prescribe any more narcotics.         Amount and/or Complexity of Data Reviewed  Review and summarize past medical records: yes    Risk of Complications, Morbidity, and/or Mortality  Presenting problems: low  Diagnostic procedures: low  Management options: low    Patient Progress  Patient progress: stable      Procedures    -------------------------------------------------------------------------------------------------------------------  Orders:  Orders Placed This Encounter   ??? ibuprofen (MOTRIN) 800 mg tablet     Sig: Take 1 Tab by mouth every six (6) hours as needed for Pain for up to 7 days.     Dispense:  20 Tab     Refill:  0   ??? oxyCODONE-acetaminophen (PERCOCET) 5-325 mg per tablet     Sig: Take 1 Tab by mouth every four (4) hours as needed for Pain. Max Daily Amount: 6 Tabs.     Dispense:  2 Tab     Refill:  0        Progress Notes:  4:32 PM:  Michiel SitesAlison H Morris Longenecker, PA-C was at the pt's bedside, assessed the pt and answered the pt's questions regarding treatment.    -------------------------------------------------------------------------------------------------------------------    Disposition:  Diagnosis:   1. Chronic foot pain, unspecified laterality    2. Chronic hand pain, left        Disposition: DC Home    Follow-up Information    Follow up With Details Comments Contact Info    Nicholes RoughSmaranda A Galis, MD Call in 2 days For further evaluation [address]  [address]  [place] [id number]  [phone]            Current Discharge Medication List      START taking these medications    Details   ibuprofen (MOTRIN) 800 mg tablet Take 1 Tab by mouth every six (6) hours as needed for Pain for up to 7 days.  Qty: 20 Tab, Refills: 0      !!  oxyCODONE-acetaminophen (PERCOCET) 5-325 mg per tablet Take 1 Tab by mouth every four (4) hours as needed for Pain. Max Daily Amount: 6 Tabs.  Qty: 2 Tab, Refills: 0       !! - Potential duplicate medications found. Please discuss with provider.      CONTINUE these medications which have NOT CHANGED    Details   !! oxyCODONE-acetaminophen (PERCOCET) 5-325 mg per tablet Take 1 Tab by mouth every four (4) hours as needed for Pain. Max Daily Amount: 6 Tabs.  Qty: 6 Tab, Refills: 0      gabapentin (NEURONTIN) 100 mg capsule Take 1 capsule by mouth three (3) times daily.  Qty: 90 capsule, Refills: 0       !! - Potential duplicate medications found. Please discuss with provider.

## 2013-08-13 NOTE — ED Notes (Signed)
I have reviewed discharge instructions with the patient.  The patient verbalized understanding. Patient armband removed and given to patient to take home.  Patient was informed of the privacy risks if armband lost or stolen Patient ambulated out of Emergency Department with stable, steady gait with use of cane.

## 2013-11-14 MED ORDER — ACETAMINOPHEN 500 MG TAB
500 mg | ORAL | Status: AC
Start: 2013-11-14 — End: 2013-11-14
  Administered 2013-11-14: 07:00:00 via ORAL

## 2013-11-14 MED ORDER — OXYCODONE-ACETAMINOPHEN 5 MG-325 MG TAB
5-325 mg | ORAL_TABLET | ORAL | Status: DC | PRN
Start: 2013-11-14 — End: 2014-05-04

## 2013-11-14 MED ORDER — DIPHENHYDRAMINE 25 MG CAP
25 mg | ORAL | Status: AC
Start: 2013-11-14 — End: 2013-11-14
  Administered 2013-11-14: 07:00:00 via ORAL

## 2013-11-14 MED ORDER — OXYCODONE-ACETAMINOPHEN 5 MG-325 MG TAB
5-325 mg | ORAL_TABLET | ORAL | Status: DC
Start: 2013-11-14 — End: 2014-05-04

## 2013-11-14 MED ORDER — KETOROLAC TROMETHAMINE 60 MG/2 ML IM
60 mg/2 mL | INTRAMUSCULAR | Status: AC
Start: 2013-11-14 — End: 2013-11-14
  Administered 2013-11-14: 06:00:00 via INTRAMUSCULAR

## 2013-11-14 MED FILL — KETOROLAC TROMETHAMINE 60 MG/2 ML IM: 60 mg/2 mL | INTRAMUSCULAR | Qty: 2

## 2013-11-14 MED FILL — MAPAP EXTRA STRENGTH 500 MG TABLET: 500 mg | ORAL | Qty: 2

## 2013-11-14 MED FILL — DIPHENHYDRAMINE 25 MG CAP: 25 mg | ORAL | Qty: 1

## 2013-11-14 NOTE — ED Notes (Signed)
Pt being discharged w/ full understanding of D/C instructions. Given both verbal & written instructions. Pt understands to F/U in ED for any new or worsening symptoms and to not drive or operate heavy machinery after taking sedating meds. Leaving ED in stable condition, ambulatory w/ no further questions or concerns.

## 2013-11-14 NOTE — ED Notes (Signed)
Pt given more meds (see EMAR) for pain per his request; then being D/C'ed.

## 2013-11-14 NOTE — ED Notes (Signed)
Pt presents with chronic left leg pain. Hx of neuropathy. Pt has a scheduled orthopedic appointment in the future. Pt also presents with his own velcro ankle brace.

## 2013-11-14 NOTE — ED Notes (Signed)
Pt leaving ambulatory w/ left leg orthotic brace in place. Previously was requesting a wheelchair to go out to waiting room due to pain, pt no longer requesting this.

## 2013-11-14 NOTE — ED Provider Notes (Signed)
HPI Comments: William Griffin is a 33 y.o. male with a PMH of asthma and sciatica who presents to the ED via EMS with complaints of left leg pain. The pain has been there for several weeks and he describes it as a sharp pain from the left knee down. The patient also states that he has neuropathy. The patient wears an orthopedic shoe insert on the left, he states that he was prescribed it. The patient denies nausea vomiting, any new leg weakness or numbness, abdominal pain, diarrhea, or any other complaints at this time.     The history is provided by the patient.        Past Medical History   Diagnosis Date   ??? Asthma    ??? Sciatica         History reviewed. No pertinent past surgical history.      History reviewed. No pertinent family history.     History     Social History   ??? Marital Status: SINGLE     Spouse Name: N/A     Number of Children: N/A   ??? Years of Education: N/A     Occupational History   ??? Not on file.     Social History Main Topics   ??? Smoking status: Former Smoker -- 0.50 packs/day   ??? Smokeless tobacco: Never Used   ??? Alcohol Use: 1.5 oz/week     3 Cans of beer per week      Comment: occasional   ??? Drug Use: No   ??? Sexual Activity: Not on file     Other Topics Concern   ??? Not on file     Social History Narrative                  ALLERGIES: Review of patient's allergies indicates no known allergies.      Review of Systems   Constitutional: Negative for fever and chills.   HENT: Negative for congestion and sore throat.    Eyes: Negative for redness and visual disturbance.   Respiratory: Negative for shortness of breath.    Cardiovascular: Negative for chest pain.   Gastrointestinal: Negative for vomiting, abdominal pain and diarrhea.   Genitourinary: Negative for difficulty urinating.   Musculoskeletal: Positive for arthralgias. Negative for myalgias.   Skin: Negative for rash.   Neurological: Negative for headaches.   Psychiatric/Behavioral: Negative for dysphoric mood.    All other systems reviewed and are negative.      Filed Vitals:    11/14/13 0212   BP: 135/95   Pulse: 85   Temp: 98.1 ??F (36.7 ??C)   Resp: 18   Height: 5\' 5"  (1.651 m)   Weight: 68.04 kg (150 lb)   SpO2: 100%            Physical Exam   Constitutional: He is oriented to person, place, and time. He appears well-developed and well-nourished. No distress.   HENT:   Head: Normocephalic and atraumatic.   Mouth/Throat: Oropharynx is clear and moist.   Eyes: Conjunctivae and EOM are normal. Pupils are equal, round, and reactive to light. No scleral icterus.   Neck: Normal range of motion. Neck supple.   Cardiovascular: Normal rate, regular rhythm and normal heart sounds.    No murmur heard.  Pulmonary/Chest: Effort normal and breath sounds normal. No respiratory distress.   Abdominal: Soft. Bowel sounds are normal. He exhibits no distension. There is no tenderness.   Musculoskeletal: He exhibits no edema.  Left upper leg: He exhibits tenderness. He exhibits no swelling.   Mild pain to palpation of left thigh.    Lymphadenopathy:     He has no cervical adenopathy.   Neurological: He is alert and oriented to person, place, and time. Coordination normal.   Skin: Skin is warm and dry. No rash noted.   Psychiatric: He has a normal mood and affect. His behavior is normal.   Nursing note and vitals reviewed.       MDM    Procedures    -------------------------------------------------------------------------------------------------------------------  Orders:  Orders Placed This Encounter   ??? ketorolac tromethamine (TORADOL) 60 mg/2 mL injection 60 mg     Sig:    ??? oxyCODONE-acetaminophen (PERCOCET) 5-325 mg per tablet     Sig: Take 1 Tab by mouth every four (4) hours as needed for Pain. Max Daily Amount: 6 Tabs.     Dispense:  20 Tab     Refill:  0   ??? oxyCODONE-acetaminophen (PERCOCET) 5-325 mg per tablet     Sig: Take 1 tablet every 4-6 hours as needed for pain control.  If you  were instructed to try over the counter ibuprofen or tylenol, only take the percocet for pain not controlled with the over the counter medication.     Dispense:  5 Tab     Refill:  0   ??? oxyCODONE-acetaminophen (PERCOCET) 10-325 mg per tablet     Sig: Take 1 Tab by mouth every four (4) hours as needed for Pain.                EKG Readings & Lab Results:   No results found for this or any previous visit (from the past 12 hour(s)).    Radiology Results:  No orders to display       Consultations:  None    Progress Notes:  2:06 AM:  Dr. Jerre Simon, MD at the bedside evaluating the patient. Answered the patient's questions regarding treatment and the patient understands.     2:22 AM: Patient resting in no distress.     -------------------------------------------------------------------------------------------------------------------      Disposition:  Diagnosis:   1. Chronic pain          Disposition: Discharged     Follow-up Information    Follow up With Details Comments Contact Info    Nurudeen Darin Engels, DO Schedule an appointment as soon as possible for a visit in 1 day  [address]  [address]  [place] [address]  [phone]      [facility] EMERGENCY DEPT  Return at once for any worsening symptoms.  [address]  [place] [address]  [phone]          Current Discharge Medication List      START taking these medications    Details   !! oxyCODONE-acetaminophen (PERCOCET) 5-325 mg per tablet Take 1 Tab by mouth every four (4) hours as needed for Pain. Max Daily Amount: 6 Tabs.  Qty: 20 Tab, Refills: 0      !! oxyCODONE-acetaminophen (PERCOCET) 5-325 mg per tablet Take 1 tablet every 4-6 hours as needed for pain control.  If you were instructed to try over the counter ibuprofen or tylenol, only take the percocet for pain not controlled with the over the counter medication.  Qty: 5 Tab, Refills: 0       !! - Potential duplicate medications found. Please discuss with provider.       CONTINUE these  medications which have NOT CHANGED    Details   oxyCODONE-acetaminophen (PERCOCET) 10-325 mg per tablet Take 1 Tab by mouth every four (4) hours as needed for Pain.      gabapentin (NEURONTIN) 100 mg capsule Take 1 capsule by mouth three (3) times daily.  Qty: 90 capsule, Refills: 0                 Scribe Attestation:   written by: William Midkiff, (2:06 AM) scribing for and in the presence of Dr.Eural Holzschuh A Gia Lusher, MD ED Provider (2:06 AM).          PROVIDER ATTESTATION STATEMENT  I personally performed the services described in the documentation, reviewed the documentation, as recorded by the scribe in my presence, and it accurately and completely records my words and actions.  Dr.Ronn Smolinsky Sheran LuzA Traquan Duarte, MD

## 2014-01-16 ENCOUNTER — Encounter: Attending: Nurse Practitioner | Primary: Family

## 2014-02-02 NOTE — Telephone Encounter (Signed)
PATIENT STATES HE IS COMPLETELY OUT OF HIS MEDICATION . HE USES  [facility] [address]

## 2014-02-03 NOTE — Telephone Encounter (Signed)
Pt can discuss at next appt on 02/06/14

## 2014-02-03 NOTE — Telephone Encounter (Signed)
PLS CALL PT AT THE FOLLOWING P# WHEN RX IS READY P#[phone]

## 2014-02-03 NOTE — Telephone Encounter (Signed)
I called and spoke with the patient anad explained to him that he will have to talk with the doctor on his next appointment regarding a refill. Last rx was filled on 01/06/14 per PMP, will be to early. Patient was aware but stated he took more because of the pain. But stated that he will wait.

## 2014-02-03 NOTE — Telephone Encounter (Signed)
Roxicodone last filled on 01/02/14.

## 2014-02-06 ENCOUNTER — Ambulatory Visit: Admit: 2014-02-06 | Payer: PRIVATE HEALTH INSURANCE | Attending: Nurse Practitioner | Primary: Family

## 2014-02-06 DIAGNOSIS — M5430 Sciatica, unspecified side: Secondary | ICD-10-CM

## 2014-02-06 MED ORDER — OXYCODONE 15 MG TAB
15 mg | ORAL_TABLET | Freq: Three times a day (TID) | ORAL | Status: DC | PRN
Start: 2014-02-06 — End: 2014-03-07

## 2014-02-06 NOTE — Progress Notes (Signed)
Chief complaint/History of Present Illness:  Chief Complaint   Patient presents with   ??? Back Pain     foot drop left side     HPI  William Griffin is a  33 y.o.  male who presents today for follow up of low back pain with bilateral left greater than right leg pain.  The pains in his legs is a shocking, stabbing pain.  On the left it starts at the knee and goes down the back of the calf to the foot.  The right left is a needle type of pain mostly in the foot.   He states he is not having any back pain right now.   Reported pain level is 10/10 without medication and 3/10 with roxicodone 15 mg tid and  neurontin 300 mg 3 tabs bid and 4 tabs at night.  . Last dose of roxi was Saturday.  Denies side effects.   This enables to the pt to be functional, achieve ADLs and engage in social activities.  He works at [employer] as a Psychologist, occupationalwelder.  He states that he has not worked in the last 1 1/2 weeks due to his supervisor telling him that he needed to go to HR as the pt was stating he could not get in small/tight welding places due to his AFO.  He called HR and he is now applying for disability through his job.  He went to back to work this past June and he last worked on 01/27/14     Reports no new medical issues or hospitalizations since the last visit.  He states the neurologist still does not know what has caused his weakness and may repeat an EMG      Review of systems:  Review of Systems   Constitutional: Negative for fever, weight loss and malaise/fatigue.   Gastrointestinal: Negative for constipation.   Musculoskeletal: Negative for myalgias and back pain.   Skin: Negative.    Neurological: Positive for tingling and sensory change. Negative for weakness.   Psychiatric/Behavioral: Negative.  Negative for depression and suicidal ideas.       Past Medical History   Diagnosis Date   ??? Asthma    ??? Sciatica      No past surgical history on file.  History     Social History   ??? Marital Status: SINGLE     Spouse Name: N/A      Number of Children: N/A   ??? 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 Smoking status: Former Smoker -- 0.50 packs/day   ??? Smokeless tobacco: Never Used   ??? Alcohol Use: 1.5 oz/week     3 Cans of beer per week      Comment: occasional   ??? Drug Use: No   ??? Sexual Activity: Not on file     Other Topics Concern   ??? Not on file     Social History Narrative     No family history on file.    Physical Exam:  BP 140/88 mmHg   Pulse 94   Temp(Src) 97.7 ??F (36.5 ??C) (Oral)   Resp 18   Ht 5\' 4"  (1.626 m)   Wt 154 lb (69.854 kg)   BMI 26.42 kg/m2   SpO2 98%    Physical Exam   Constitutional: He is well-developed, well-nourished, and in no distress. No distress.   Musculoskeletal:  Lumbar back: He exhibits decreased range of motion and pain. He exhibits no tenderness, no bony tenderness, no swelling, no edema, no deformity and no spasm.   Neurological: He is alert. He displays weakness. He exhibits normal muscle tone. He has a normal Straight Leg Raise Test. Gait abnormal. Coordination normal.   Antalgic gait  Has left le AFO  ble 4/5   Skin: Skin is warm and dry. He is not diaphoretic.   Psychiatric: Mood and affect normal.              Impression and Plan:    PMP has been reviewed and is appropriate      We have informed Damain L Radney to notify us for immediate appointment if he has any worsening neurogical symptoms or if an emergency situation presents, then call 911                Kipp BroodBrent was seen today for back pain.  He will f/u with neurology for EMF  Will refer to [facility]    Diagnoses and associated orders for this visit:    Sciatica, unspecified laterality  - oxyCODONE IR (ROXICODONE) 15 mg immediate release tablet; Take 1 Tab by mouth three (3) times daily as needed for Pain. Max Daily Amount: 45 mg.  - REFERRAL TO PAIN MANAGEMENT    Therapeutic drug monitoring  - DRUG SCREEN UR - W/ CONFIRM    Other Orders   - Cancel: gabapentin (NEURONTIN) 300 mg capsule; Take 1 Cap by mouth three (3) times daily.  - Cancel: oxyCODONE (OXYIR) 5 mg capsule;   - Cancel: oxyCODONE IR (ROXICODONE) 15 mg immediate release tablet; Take 1 Tab by mouth every eight (8) hours as needed for Pain. Max Daily Amount: 45 mg.

## 2014-03-07 ENCOUNTER — Encounter

## 2014-03-07 NOTE — Telephone Encounter (Signed)
Patient last seen by Freddi StarrLora Blount on 02-06-2014, was given Oxycodone 15 mg # 90 tabs , filled on 02-06-2014, referred to [facility], not scheduled yet, next appt here scheduled for 04-10-2014 with Freddi StarrLora Blount, needs refill.

## 2014-03-07 NOTE — Telephone Encounter (Signed)
Roxicodone 15mg . Pick up at [facility] Call patient back at [phone]

## 2014-03-08 MED ORDER — OXYCODONE 15 MG TAB
15 mg | ORAL_TABLET | Freq: Three times a day (TID) | ORAL | Status: DC | PRN
Start: 2014-03-08 — End: 2014-03-14

## 2014-03-08 NOTE — Telephone Encounter (Signed)
rx ready for pick up at front office. Patient's phone says unable to except calls at this time and when I called his work he was not there

## 2014-03-08 NOTE — Telephone Encounter (Signed)
PT CALLED FOR STATUS UPDATE

## 2014-03-13 ENCOUNTER — Encounter

## 2014-03-13 NOTE — Telephone Encounter (Signed)
Refill was just approved on 03/08/14.  Did he pick it up?

## 2014-03-13 NOTE — Telephone Encounter (Signed)
Oxycodone hcl 15mg  [phone]   Pharmacy # [id number] [facility] [address]     MR Flinders IS REQUESTING A REFILL OF HIS OXYCODONE. STATES THAT HE IS GOING OUT OF TOWN THIS EVENING AND WOULD LIKE TO PU ASAP #[phone] THANK YOU :)     PT WAS ADVISED OF THE 24-48HR  TURNAROUND

## 2014-03-13 NOTE — Telephone Encounter (Signed)
Last Visit: 02/06/2014 with NP Blount    Next Appointment: 04/10/2014 with NP Blount   Previous Refill Encounters: 03/08/2014 per NP Tiburcio PeaHarris #[id number]     Requested Prescriptions     Pending Prescriptions Disp Refills   ??? oxyCODONE IR (ROXICODONE) 15 mg immediate release tablet 90 Tab 0     Sig: Take 1 Tab by mouth three (3) times daily as needed for Pain. Max Daily Amount: 45 mg.

## 2014-03-14 MED ORDER — OXYCODONE 15 MG TAB
15 mg | ORAL_TABLET | Freq: Three times a day (TID) | ORAL | Status: DC | PRN
Start: 2014-03-14 — End: 2014-04-04

## 2014-03-14 NOTE — Telephone Encounter (Signed)
This RX is replacing the Rx that was given on 03-08-14 , patient [facility] to having to pay [facility] for RX, his insurance would not pay for his RX, this was verified with Judeth CornfieldStephanie with [facility] [phone]

## 2014-03-14 NOTE — Telephone Encounter (Signed)
Left message at the phone # patient wanted us to call # [phone] for Mr. William Griffin to return my call.

## 2014-03-14 NOTE — Telephone Encounter (Addendum)
[facility]. He will call back as he does not have a phone and his friend is not with him. He did pick up RX 11/25 [facility] [address] [facility] of  Meds. He does have an ER and must go out of town and will need the prescription for the rest of the Roxicodone.

## 2014-03-14 NOTE — Telephone Encounter (Signed)
Notify pt

## 2014-03-14 NOTE — Progress Notes (Signed)
LM at # provided [phone] that his medication is ready for pick up

## 2014-03-22 NOTE — Telephone Encounter (Signed)
PATIENT WAS CALLING TO LET YOU KNOW THAT DUE TO HIS CURRENT SITUATION HE IS NOT LONGER WORKING BECAUSE HIS LEG IS GETTING WORSE

## 2014-03-23 NOTE — Telephone Encounter (Signed)
Freddi StarrLora Blount NP was informed of same. He has f/u apt 04/10/14.

## 2014-03-30 NOTE — Telephone Encounter (Signed)
Pt states that  Pain management can not schedule an appt for him because they have not received an order or records. Please advise [phone].

## 2014-03-31 NOTE — Telephone Encounter (Signed)
Faxed referral to [facility] , confirmation page received. Unable to reach patient by number listed.

## 2014-04-04 ENCOUNTER — Telehealth

## 2014-04-04 MED ORDER — OXYCODONE 15 MG TAB
15 mg | ORAL_TABLET | Freq: Three times a day (TID) | ORAL | Status: DC | PRN
Start: 2014-04-04 — End: 2014-04-12

## 2014-04-04 NOTE — Telephone Encounter (Signed)
Patient left VM requesting Roxicodone. Per last office note, patient was referred to pain management. Orlean Pattenebra Russel faxed referral to [facility] on 03/31/2014. Please advise.    LOV: 02/06/2014 with NP Blount   NOV: 04/10/2014 with NP Blount     oxyCODONE IR (ROXICODONE) 15 mg immediate release tablet 55 Tab 0 03/14/2014       Sig - Route: Take 1 Tab by mouth three (3) times daily as needed for Pain. Max Daily Amount: 45 mg. - Oral     Class: Print

## 2014-04-04 NOTE — Telephone Encounter (Signed)
Will give enough until appt.  Call pt to pick up

## 2014-04-05 NOTE — Telephone Encounter (Signed)
Cindy from [facility] is calling as she has not been able to reach patient. Phone no. not in service.  Patient's friend [name] informed [name] he has not been in touch with William Griffin for several days.  At this time cannot schedule appt for pain mgmt.

## 2014-04-05 NOTE — Telephone Encounter (Signed)
LEFT MESSAGE ON VOICE MAIL FOR PATIENT THAT HIS RX IS READY FOR PICK UP AT [facility]

## 2014-04-05 NOTE — Telephone Encounter (Signed)
Please review.

## 2014-04-06 NOTE — Telephone Encounter (Signed)
I have reviewed his chart, Mervyn GayLora it looks like you referred him to [facility] on his last OV in October. Will take a look at this message.     NNK

## 2014-04-06 NOTE — Telephone Encounter (Signed)
Try calling pt and have him call the [facility] to get his appt scheduled.

## 2014-04-06 NOTE — Telephone Encounter (Signed)
Called and spoke to brother [name] and informed him that [place] at [facility] has been trying to contact [place] for apt. At their facility. Info. About phone # and address given to him and also informed of his apt. Here with Mervyn GayLora this Monday 04/10/14 at 8:30 am. He states that he will probably talk with his brother in next few days and will inform his brother of this info.

## 2014-04-10 ENCOUNTER — Encounter: Attending: Nurse Practitioner | Primary: Family

## 2014-04-12 ENCOUNTER — Ambulatory Visit
Admit: 2014-04-12 | Discharge: 2014-04-12 | Payer: PRIVATE HEALTH INSURANCE | Attending: Nurse Practitioner | Primary: Family

## 2014-04-12 ENCOUNTER — Ambulatory Visit: Attending: Nurse Practitioner | Primary: Family

## 2014-04-12 DIAGNOSIS — M5430 Sciatica, unspecified side: Secondary | ICD-10-CM

## 2014-04-12 MED ORDER — OXYCODONE 15 MG TAB
15 mg | ORAL_TABLET | Freq: Three times a day (TID) | ORAL | Status: DC | PRN
Start: 2014-04-12 — End: 2014-04-17

## 2014-04-12 MED ORDER — GABAPENTIN 300 MG CAP
300 mg | ORAL_CAPSULE | ORAL | Status: DC
Start: 2014-04-12 — End: 2014-05-04

## 2014-04-12 NOTE — Patient Instructions (Signed)
MyChart Activation    Thank you for requesting access to MyChart. Please follow the instructions below to securely access and download your online medical record. MyChart allows you to send messages to your doctor, view your test results, renew your prescriptions, schedule appointments, and more.    How Do I Sign Up?    1. In your internet browser, go to [email or web address]  2. Click on the First Time User? Click Here link in the Sign In box. You will be redirect to the New Member Sign Up page.  3. Enter your MyChart Access Code exactly as it appears below. You will not need to use this code after you???ve completed the sign-up process. If you do not sign up before the expiration date, you must request a new code.    MyChart Access Code: [email or web address]  Expires: 07/11/2014 11:05 AM (This is the date your MyChart access code will expire)    4. Enter the last four digits of your Social Security Number (xxxx) and Date of Birth (mm/dd/yyyy) as indicated and click Submit. You will be taken to the next sign-up page.  5. Create a MyChart ID. This will be your MyChart login ID and cannot be changed, so think of one that is secure and easy to remember.  6. Create a MyChart password. You can change your password at any time.  7. Enter your Password Reset Question and Answer. This can be used at a later time if you forget your password.   8. Enter your e-mail address. You will receive e-mail notification when new information is available in MyChart.  9. Click Sign Up. You can now view and download portions of your medical record.  10. Click the Download Summary menu link to download a portable copy of your medical information.    Additional Information    If you have questions, please visit the Frequently Asked Questions section of the MyChart website at [email or web address] Remember, MyChart is NOT to be used for urgent needs. For medical emergencies, dial 911.

## 2014-04-12 NOTE — Progress Notes (Signed)
Chief complaint/History of Present Illness:  Chief Complaint   Patient presents with   ??? Back Pain     med refill     HPI  William Griffin is a  33 y.o.  male    This note was dictated.      Review of systems:    Past Medical History   Diagnosis Date   ??? Asthma    ??? Sciatica      History reviewed. No pertinent past surgical history.  History     Social History   ??? Marital Status: SINGLE     Spouse Name: N/A     Number of Children: N/A   ??? 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 Smoking status: Former Smoker -- 0.50 packs/day   ??? Smokeless tobacco: Never Used   ??? Alcohol Use: 1.5 oz/week     3 Cans of beer per week      Comment: occasional   ??? Drug Use: No   ??? Sexual Activity: Not on file     Other Topics Concern   ??? Not on file     Social History Narrative     No family history on file.    Physical Exam:  BP 140/90 mmHg   Pulse 101   Temp(Src) 97.6 ??F (36.4 ??C) (Oral)   Resp 18   Ht 5\' 4"  (1.626 m)   Wt 159 lb 9.6 oz (72.394 kg)   BMI 27.38 kg/m2   SpO2 99%    PMP has been reviewed and is appropriate    Pt has a consistent UDS in the record      William Griffin was seen today for back pain.    Diagnoses and all orders for this visit:    Sciatica, unspecified laterality  Orders:  -     oxyCODONE IR (ROXICODONE) 15 mg immediate release tablet; Take 1 Tab by mouth three (3) times daily as needed for Pain. Max Daily Amount: 45 mg.    Other orders    -     gabapentin (NEURONTIN) 300 mg capsule; 3 tabs po bid and 4 tabs q hs            Follow-up Disposition:  Return in about 3 months (around 07/12/2014) for with Dr Jean RosenthalJackson.        We have informed William Griffin to notify us for immediate appointment if he has any worsening neurogical symptoms or if an emergency situation presents, then call 911

## 2014-04-13 NOTE — Progress Notes (Signed)
Transcription accepted by Arabell Neria C on 04/18/2014 at  1:03 PM  ------

## 2014-04-13 NOTE — Progress Notes (Signed)
Transcription accepted by Pebble Botkin C on 04/18/2014 at  1:03 PM  ------  HISTORY OF PRESENT ILLNESS: The patient comes in today in follow-up of his left-sided sciatica that gives him a footdrop.  His symptoms are unchanged.  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  He last worked January 27, 2014.  He is now applying for Social Security Disability.  He has been referred to [facility].  They tried contacting him, but his phone has been cut off due to his lack of work and lack of money.  He states he is getting a new phone today.  He has not been back to the neurologist yet.  They are trying to figure out why he has this left-sided footdrop.  He rates his pain as 10 out of 10 without medication and 3 out of 10 with medication. It is back pain and left leg pain.  He takes Roxicodone 15 mg 3 times per day and Neurontin 300 mg 3 tablets twice per day and 4 tablets at night, which does help with his leg pain somewhat.      PHYSICAL EXAMINATION:  Mr. William Griffin is a 33 year old male. He is alert and oriented. He has a full weightbearing, non-antalgic gait. He has 4/5 strength of bilateral lower extremities.  He has an AFO on his left lower extremity.     ASSESSMENT/PLAN: This is a patient with left-sided sciatica who has a footdrop of unknown etiology.  He has not returned to the neurologist yet.  We did call Cindy at [facility].  She states that he must have a phone for them to accept him.  We gave him her phone number and he will call her this afternoon when he gets his phone reestablished.   We are going to give him one month of refills that should get him through until he gets into [facility].  Otherwise, we will see him back in three months for follow-up with Dr. Delrae Sawyersheresa Jackson.

## 2014-04-17 ENCOUNTER — Encounter

## 2014-04-17 NOTE — Telephone Encounter (Signed)
Per Deanna ArtisKeisha at [facility] [place], patient received a partial fill (30 tabs) as the pharmacy did not have enough medication in stock. Pend remaining tabs.    Last Visit: 04/12/2014 with NP Bruna PotterBlount    Next Appointment: 07/12/2014 with MD Jean RosenthalJackson   Previous Refill Encounters: 04/12/2014 per NP Bruna PotterBlount #[id number]     Requested Prescriptions     Pending Prescriptions Disp Refills   ??? oxyCODONE IR (ROXICODONE) 15 mg immediate release tablet 60 Tab 0     Sig: Take 1 Tab by mouth three (3) times daily as needed for Pain. Max Daily Amount: 45 mg.

## 2014-04-17 NOTE — Telephone Encounter (Signed)
Pt was suppose to call [facility] the day of his last visit here after he got his phone activated and get an appt.    What is the date of his appt.?

## 2014-04-18 MED ORDER — OXYCODONE 15 MG TAB
15 mg | ORAL_TABLET | Freq: Three times a day (TID) | ORAL | Status: DC | PRN
Start: 2014-04-18 — End: 2014-04-24

## 2014-04-18 NOTE — Telephone Encounter (Signed)
Patients phone is still disconnected.  Patient does not have an appointment scheduled with [facility].

## 2014-04-18 NOTE — Telephone Encounter (Signed)
See the documentation regarding this being the last rx given

## 2014-04-18 NOTE — Telephone Encounter (Signed)
He specifically told me that he was going to leave our office the day of his last visit and get a new phone activated.  He then was going to call [facility] for an appt.  I told him I would only give him this one month of medication to get him to his appt.  This will be the last rx given so he needs to make the arrangements to get into [facility]

## 2014-04-18 NOTE — Telephone Encounter (Signed)
Unable to contact patient due to not having as working phone number at this time.  Script at front desk.

## 2014-04-21 ENCOUNTER — Encounter

## 2014-04-21 NOTE — Telephone Encounter (Signed)
He received a prescription on 04/18/2013.     Please call his pharmacy to see how many were provided on 04/18/2013.    NNK

## 2014-04-21 NOTE — Telephone Encounter (Signed)
Patient is requesting a prescription for the last 30 pill of the Roxicodone which was orig prescribed by Lora B. 04/12/14  He has only been able to get 30 pills at a time due to his finances. [facility] but pharmacist could only give  William Griffin 30 pills. He contacted office on 04/18/2014 and [facility]  He has to go out of town next week to help is daughter in [place]. Please call him back at [phone]

## 2014-04-24 MED ORDER — OXYCODONE 15 MG TAB
15 mg | ORAL_TABLET | Freq: Three times a day (TID) | ORAL | Status: DC | PRN
Start: 2014-04-24 — End: 2014-04-24

## 2014-04-24 MED ORDER — OXYCODONE 15 MG TAB
15 mg | ORAL_TABLET | Freq: Three times a day (TID) | ORAL | Status: AC | PRN
Start: 2014-04-24 — End: 2014-05-04

## 2014-04-24 NOTE — Telephone Encounter (Signed)
[facility] PT RX READY FOR PICKUP LATEST TIME IS 4 30 PT [facility] HE WAS ON HIS WAY

## 2014-04-24 NOTE — Telephone Encounter (Signed)
Patient called on status of the 30 addl Roxicodone which he was able to get on his last refill.  Please call him back [phone]

## 2014-04-24 NOTE — Telephone Encounter (Signed)
[facility]  Refill Request       Patient: William Griffin                         MRN: [id number]     Age:  34 y.o.,      Sex: male    Date of Birth:  Jul 11, 1980           Today's Date: April 24, 2014  PCP: None  Assessment:     Encounter Diagnoses   Name Primary?   ??? Sciatica, unspecified laterality Yes       Plan:     Last OV: 04/12/2014  Next OV: 07/12/2014  Last Refill: 04/18/2014 (30 tabs filled, verified with pharmacy)   Last UDS: 02/06/2014      Orders  Orders Placed This Encounter   ??? oxyCODONE IR (ROXICODONE) 15 mg immediate release tablet     Sig: Take 1 Tab by mouth three (3) times daily as needed for Pain for up to 10 days. Max Daily Amount: 45 mg.     Dispense:  30 Tab     Refill:  0       Norrine Ballester N Mellody DanceKEITH, DNP, NP-C  04/24/2014  3:58 PM

## 2014-05-03 ENCOUNTER — Encounter: Attending: Physical Medicine & Rehabilitation | Primary: Family

## 2014-05-04 ENCOUNTER — Ambulatory Visit
Admit: 2014-05-04 | Discharge: 2014-05-04 | Payer: PRIVATE HEALTH INSURANCE | Attending: Family Medicine | Primary: Family

## 2014-05-04 DIAGNOSIS — M5430 Sciatica, unspecified side: Secondary | ICD-10-CM

## 2014-05-04 MED ORDER — GABAPENTIN 300 MG CAP
300 mg | ORAL_CAPSULE | ORAL | Status: DC
Start: 2014-05-04 — End: 2014-07-29

## 2014-05-04 NOTE — Progress Notes (Signed)
Pt completed ROI to receive records from [facility].    Discharge instructions reviewed with patient    Medication list and understanding of medications reviewed with patient.   OTC and herbal medications reviewed and added to med list if applicable  Barriers to adherence assessed.    Guidance given regarding new medications this visit, including reason for taking this medicine, and common side effects.

## 2014-05-04 NOTE — Patient Instructions (Signed)
Call [facility] [employer] at [phone]. They will screen you for any insurance you might qualify for. This is the first step before you can receive discounts at the hospital or [facility] specialists.     Additional contact numbers for [facility] are below:  For [place] patients: [phone]  For [place] patients: [phone]  For [place] patients: [phone]

## 2014-05-08 NOTE — Progress Notes (Signed)
HPI  William Griffin is a 34 y.o. male being seen today for   Chief Complaint   Patient presents with   ??? Leg Swelling     "pt stated that this been going on for about a month"   .  he states that he has over a year of neuropathic pain of unknown cause.   Seen by ortho and they referred him to pain management but he cannot afford the copay until he completes fnancial screening.   Out of his meds  Saw [facility] in the past and per ortho notes he needs further workup by neuro.     Does have abnormal EMG ([facility])    Past Medical History   Diagnosis Date   ??? Asthma    ??? Sciatica    ??? Chronic pain          ROS  Patient states that he is feeling well. Denies complaints of chest pain, shortness of breath, swelling of legs, dizziness or weakness. he denies nausea, vomiting or diarrhea.        Current Outpatient Prescriptions   Medication Sig   ??? gabapentin (NEURONTIN) 300 mg capsule 3 tabs po bid and 4 tabs q hs     No current facility-administered medications for this visit.       PE  BP 133/90 mmHg   Pulse 71   Temp(Src) 97.3 ??F (36.3 ??C) (Oral)   Resp 16   Ht  (1.626 m)   Wt 155 lb 9.6 oz (70.58 kg)   BMI 26.70 kg/m2   SpO2 99%     Alert and oriented with normal mood and affect. he is well developed and well nourished . Lungs are clear without wheezing. Heart rate is regular without murmurs or gallops. There is no lower extremity edema.     Results for orders placed or performed during the hospital encounter of 08/04/13   CBC WITH AUTOMATED DIFF   Result Value Ref Range    WBC 3.7 (L) 4.6 - 13.2 K/uL    RBC 4.17 (L) 4.70 - 5.50 M/uL    HGB 13.0 13.0 - 16.0 g/dL    HCT 91.4 78.2 - 95.6 %    MCV 89.2 74.0 - 97.0 FL    MCH 31.2 24.0 - 34.0 PG    MCHC 34.9 31.0 - 37.0 g/dL    RDW 21.3 08.6 - 57.8 %    PLATELET 271 135 - 420 K/uL    MPV 10.7 9.2 - 11.8 FL    NEUTROPHILS 41 40 - 73 %    LYMPHOCYTES 45 21 - 52 %    MONOCYTES 13 (H) 3 - 10 %    EOSINOPHILS 1 0 - 5 %    BASOPHILS 0 0 - 2 %     ABS. NEUTROPHILS 1.5 (L) 1.8 - 8.0 K/UL    ABS. LYMPHOCYTES 1.6 0.9 - 3.6 K/UL    ABS. MONOCYTES 0.5 0.05 - 1.2 K/UL    ABS. EOSINOPHILS 0.1 0.0 - 0.4 K/UL    ABS. BASOPHILS 0.0 0.0 - 0.06 K/UL    DF AUTOMATED     METABOLIC PANEL, BASIC   Result Value Ref Range    Sodium 142 136 - 145 mmol/L    Potassium 4.1 3.5 - 5.5 mmol/L    Chloride 111 (H) 100 - 108 mmol/L    CO2 24 21 - 32 mmol/L    Anion gap 7 3.0 - 18 mmol/L    Glucose 96  74 - 99 mg/dL    BUN 7 7.0 - 18 MG/DL    Creatinine 5.401.03 0.6 - 1.3 MG/DL    BUN/Creatinine ratio 7 (L) 12 - 20      GFR est AA >60 >60 ml/min/1.3473m2    GFR est non-AA >60 >60 ml/min/1.4873m2    Calcium 8.9 8.5 - 10.1 MG/DL   EKG, 12 LEAD, INITIAL   Result Value Ref Range    Ventricular Rate 84 BPM    Atrial Rate 84 BPM    P-R Interval 148 ms    QRS Duration 92 ms    Q-T Interval 348 ms    QTC Calculation (Bezet) 411 ms    Calculated P Axis 58 degrees    Calculated R Axis 66 degrees    Calculated T Axis 33 degrees    Diagnosis       Normal sinus rhythm  Normal ECG  When compared with ECG of 20-Jul-2013 06:03,  No significant change was found  Confirmed by Earl LitesPatel, Saumil [id number]) on 08/05/2013 2:47:54 PM           Assessment and Plan:        ICD-10-CM ICD-9-CM    1. Sciatica, unspecified laterality M54.30 724.3    2. Neuropathy (HCC) G62.9 355.9      Refilled neurontin  Reviewed we cannot rx roxicodone    He will start financial screening and may still be able to see pain management  ROI for [facility] notes      [name], MD

## 2014-05-11 ENCOUNTER — Inpatient Hospital Stay
Admit: 2014-05-11 | Discharge: 2014-05-11 | Disposition: A | Discharge status: Home / Self Care | Payer: Self-pay | Attending: Emergency Medicine

## 2014-05-11 DIAGNOSIS — G8929 Other chronic pain: Secondary | ICD-10-CM

## 2014-05-11 MED ORDER — OXYCODONE 15 MG TAB
15 mg | ORAL_TABLET | ORAL | Status: DC | PRN
Start: 2014-05-11 — End: 2014-05-23

## 2014-05-11 NOTE — ED Notes (Signed)
I have reviewed discharge instructions with the patient.  The patient verbalized understanding. Patient armband removed and given to patient to take home.  Patient was informed of the privacy risks if armband lost or stolen

## 2014-05-11 NOTE — ED Notes (Signed)
Chronic bilateral foot pain. Says has neuropathy

## 2014-05-11 NOTE — ED Provider Notes (Signed)
HPI Comments: 11:39 AM    William Griffin is a  34 y.o. male with a h/o chronic pain and neuropathy presenting to the ED c/o foot pain. The patient reports he is out of his pain meds, and is unable to f/u with a pain management physician due to loss of his insurance through a past job. The patient requests an Rx for pain meds until he can get an appointment with a service for patient's w/o insurance. An extensive conversation about the scope of the ED was had, and the patient reports he understands he will have to f/u with a specialist for long term pain management.       Patient is a 34 y.o. male presenting with foot pain. The history is provided by the patient.   Foot Pain   This is a chronic problem. The problem occurs constantly. The problem has not changed since onset.Pertinent negatives include no neck pain. The symptoms are aggravated by movement, standing and activity. He has tried nothing for the symptoms. There has been no history of extremity trauma.        Past Medical History:   Diagnosis Date   ??? Asthma    ??? Sciatica    ??? Chronic pain        History reviewed. No pertinent past surgical history.      Family History:   Problem Relation Age of Onset   ??? Hypertension Mother    ??? Hypertension Father    ??? Asthma Sister        History     Social History   ??? Marital Status: SINGLE     Spouse Name: N/A     Number of Children: N/A   ??? 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 Smoking status: Former Smoker -- 0.50 packs/day     Types: Cigarettes   ??? Smokeless tobacco: Never Used   ??? Alcohol Use: No      Comment: occasional "pt stated that he stop drinking"   ??? Drug Use: No   ??? Sexual Activity: Not on file     Other Topics Concern   ??? Not on file     Social History Narrative           ALLERGIES: Review of patient's allergies indicates no known allergies.      Review of Systems   Constitutional: Negative for fever, chills and fatigue.    HENT: Negative.    Eyes: Negative.    Respiratory: Negative.  Negative for chest tightness and shortness of breath.    Cardiovascular: Negative.  Negative for chest pain, palpitations and leg swelling.   Gastrointestinal: Negative for vomiting and diarrhea.   Genitourinary: Negative for dysuria, hematuria and flank pain.   Musculoskeletal: Positive for arthralgias (foot pain) and gait problem. Negative for joint swelling, neck pain and neck stiffness.   Skin: Negative for color change and pallor.   Neurological: Negative.  Negative for headaches.       Filed Vitals:    05/11/14 1144   BP: 162/110   Pulse: 100   Temp: 98 ??F (36.7 ??C)   Resp: 17   SpO2: 100%            Physical Exam   Constitutional: He is oriented to person, place, and time. He appears well-developed and well-nourished.   HENT:   Head: Normocephalic and atraumatic.  Eyes: Conjunctivae are normal. No scleral icterus.   Neck: Normal range of motion. Neck supple.   Cardiovascular: Normal rate, regular rhythm and normal heart sounds.    Pulmonary/Chest: Effort normal and breath sounds normal.   Abdominal: Soft. He exhibits no distension. There is no tenderness.   Musculoskeletal: Normal range of motion. He exhibits no edema or tenderness.   Neurological: He is alert and oriented to person, place, and time.   Skin: Skin is warm and dry.   Nursing note and vitals reviewed.       MDM  Number of Diagnoses or Management Options  Chronic pain:   Diagnosis management comments: ? etiol of chronic lower ext discomfort, no DM, no acute complicating factors, work note and bridge rx furnished. Pt will f/u per plan.      Procedures                           Scribe Attestation:   I, DEIDRA ATKINSON, SCRIBE am documenting for and in the presence of Inge Riseobin A Lonn Im, MD  05/11/2014 at 11:56 AM

## 2014-05-23 DIAGNOSIS — G8929 Other chronic pain: Secondary | ICD-10-CM

## 2014-05-23 NOTE — ED Notes (Signed)
Patient with chronic pain in left leg, increased pain over past 2 weeks, awaiting pain management.

## 2014-05-23 NOTE — ED Notes (Signed)
I have reviewed discharge instructions with the patient.  The patient verbalized understanding. Discharge medications reviewed with patient and appropriate educational materials and side effects teaching were provided. Patient armband removed and shredded.

## 2014-05-23 NOTE — ED Provider Notes (Signed)
HPI Comments:   10:58 PM   34 y.o. male presents to ED C/O chronic left lower leg pain. He states that he was in pain management and was taking Percocet 15 mg and Neurontin. His primary doctor will fill his Neurontin but not the percocet. He is working with [employer] to get back into pain management and is supposed to get in next week. He is here today requesting a refill of his Percocet. He has had an EEG study and a DVT study and they cannot find out why he has the pain and neuropathy. Pt denies SOB, difficulty breathing, any other sxs or complaints.    Written by Leamon Arnt, PA-C        The history is provided by the patient.        Past Medical History:   Diagnosis Date   ??? Asthma    ??? Sciatica    ??? Chronic pain        History reviewed. No pertinent past surgical history.      Family History:   Problem Relation Age of Onset   ??? Hypertension Mother    ??? Hypertension Father    ??? Asthma Sister        History     Social History   ??? Marital Status: SINGLE     Spouse Name: N/A     Number of Children: N/A   ??? 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 Smoking status: Former Smoker -- 0.50 packs/day     Types: Cigarettes   ??? Smokeless tobacco: Never Used   ??? Alcohol Use: No      Comment: occasional "pt stated that he stop drinking"   ??? Drug Use: No   ??? Sexual Activity: Not on file     Other Topics Concern   ??? Not on file     Social History Narrative           ALLERGIES: Review of patient's allergies indicates no known allergies.      Review of Systems   Constitutional: Negative for fever, chills, activity change, appetite change and fatigue.   HENT: Negative for congestion, dental problem, ear pain, rhinorrhea, sinus pressure, sneezing, sore throat, trouble swallowing and voice change.    Eyes: Negative for pain, discharge, redness and itching.   Respiratory: Negative for cough, chest tightness, shortness of breath and wheezing.     Cardiovascular: Negative for chest pain and palpitations.   Gastrointestinal: Negative for nausea, vomiting, abdominal pain, diarrhea, constipation, blood in stool, abdominal distention and rectal pain.   Endocrine: Negative for polyuria.   Genitourinary: Negative for dysuria, hematuria, flank pain, discharge, penile pain and testicular pain.   Musculoskeletal: Positive for arthralgias (chronic left leg pain). Negative for back pain, joint swelling, neck pain and neck stiffness.   Skin: Negative for color change, rash and wound.   Allergic/Immunologic: Negative for immunocompromised state.   Neurological: Negative for dizziness, weakness, light-headedness, numbness and headaches.   Hematological: Negative for adenopathy.   Psychiatric/Behavioral: Negative for behavioral problems and agitation. The patient is not nervous/anxious.        Filed Vitals:    05/23/14 2058   BP: 140/102   Pulse: 84   Temp: 98.3 ??F (36.8 ??C)   Resp: 18   Height:  (1.651 m)   Weight: 70.761 kg (156 lb)   SpO2: 100%  Physical Exam   Constitutional: He is oriented to person, place, and time. He appears well-developed and well-nourished. No distress.   Neck: Normal range of motion.   Cardiovascular: Normal rate, regular rhythm, normal heart sounds and intact distal pulses.  Exam reveals no gallop and no friction rub.    No murmur heard.  Pulmonary/Chest: Effort normal and breath sounds normal. No respiratory distress. He has no wheezes. He has no rales. He exhibits no tenderness.   Musculoskeletal: Normal range of motion. He exhibits tenderness (Left lower leg is TTP throughout, with FROM of the knee and ankle). He exhibits no edema.   Distal pulses, motor, and sensation are intact. Strength is 5/5.   Neurological: He is alert and oriented to person, place, and time.   Skin: Skin is warm and dry. No rash noted. He is not diaphoretic.   Psychiatric: He has a normal mood and affect. His behavior is normal.  Judgment and thought content normal.   Nursing note and vitals reviewed.       RESULTS:    No orders to display       Labs Reviewed - No data to display    No results found for this or any previous visit (from the past 12 hour(s)).    MDM  Number of Diagnoses or Management Options  Chronic pain of left lower extremity: minor  Diagnosis management comments: DDx: Leg pain, chronic pain, medication refill.    Risk of Complications, Morbidity, and/or Mortality  Presenting problems: low  Diagnostic procedures: minimal  Management options: low    Patient Progress  Patient progress: stable      MEDICATIONS GIVEN:  Medications   oxyCODONE-acetaminophen (PERCOCET) 5-325 mg per tablet 1 Tab (not administered)       Procedures    PROGRESS NOTE:   10:58 PM   Initial assessment completed.   Written by Leamon Arnteanna Merry Pond, PA-C     CONSULT NOTE:   11:40 PM  Leamon Arnteanna Jones Viviani, PA-C spoke with Dr. Candie Echevariaraig Sharkey   Specialty: ER Attending  Discussed pt's hx, disposition, and available diagnostic and imaging results. Reviewed care plans. Consulting physician agrees with plans as outlined.  He will talk to patient also and advised him that we cannot give him Oxycodone 15 mg, we can give him a few Percocet 7.5/325 mg tablets but that is all and this will be the last time we fill any pain medications for him.   Written by Leamon Arnteanna Torrie Namba, PA-C    DISCHARGE NOTE:  11:45 PM    Robbie LisBrent L Mccallum's  results have been reviewed with him.  He has been counseled regarding his diagnosis, treatment, and plan.  He verbally conveys understanding and agreement of the signs, symptoms, diagnosis, treatment and prognosis and additionally agrees to follow up as discussed.  He also agrees with the care-plan and conveys that all of his questions have been answered.  I have also provided discharge instructions for him that include: educational information regarding their diagnosis and treatment, and list of reasons why they would want to return to the ED  prior to their follow-up appointment, should his condition change.     CLINICAL IMPRESSION:    1. Chronic pain of left lower extremity        AFTER VISIT PLAN:    Current Discharge Medication List      START taking these medications    Details   oxyCODONE-acetaminophen (PERCOCET) 7.5-325 mg per tablet Take 1 Tab by mouth every six (6) hours  as needed for Pain. Max Daily Amount: 4 Tabs.  Qty: 12 Tab, Refills: 0         CONTINUE these medications which have NOT CHANGED    Details   gabapentin (NEURONTIN) 300 mg capsule 3 tabs po bid and 4 tabs q hs  Qty: 300 Cap, Refills: 5              Follow-up Information     Follow up With Details Comments Contact Info    Your New Pain management Doctor Go in 1 week without fail     [facility] EMERGENCY DEPT  As needed [address]  [place] [address]  [phone]           Written by Leamon Arnt, PA-C

## 2014-05-23 NOTE — ED Notes (Signed)
Pt states he is aware the ER doesn't treat chronic pain. States he is going through the process of getting Medicaid so that he can afford pain management. Showed a letter that he states proves that he is in the process of getting pain management and should start by the end of the week. States he just needs some pain medicine to get through the week.

## 2014-05-23 NOTE — ED Notes (Signed)
Went to give pt his percocet, pt states acetaminophen hurts his stomach and that he can't take it. Provider made aware.

## 2014-05-24 ENCOUNTER — Inpatient Hospital Stay
Admit: 2014-05-24 | Discharge: 2014-05-24 | Disposition: A | Discharge status: Home / Self Care | Payer: Self-pay | Attending: Emergency Medicine

## 2014-05-24 MED ORDER — OXYCODONE-ACETAMINOPHEN 7.5 MG-325 MG TAB
ORAL_TABLET | Freq: Four times a day (QID) | ORAL | Status: DC | PRN
Start: 2014-05-24 — End: 2014-06-04

## 2014-05-24 MED ORDER — OXYCODONE-ACETAMINOPHEN 5 MG-325 MG TAB
5-325 mg | ORAL | Status: AC
Start: 2014-05-24 — End: 2014-05-23
  Administered 2014-05-24: 05:00:00 via ORAL

## 2014-05-24 MED FILL — OXYCODONE-ACETAMINOPHEN 5 MG-325 MG TAB: 5-325 mg | ORAL | Qty: 1

## 2014-05-26 NOTE — Telephone Encounter (Addendum)
Patient states that he has been referred to Pain Management but he will not be accepted into the facility until he processes an application for the [employer] [place] financial assistance program. He is requesting a prescription for Roxicodone and Neurontin. Per Epic, Dr. Herschel SenegalSalyer provided the patient with a prescription for Neurontin on 05/04/2014 for a six month supply.  Please advise.     Last Visit: 04/12/2014 with NP Bruna PotterBlount    Next Appointment: 07/12/2014 with MD Jean RosenthalJackson; referred to pain management   Previous Refill Encounters: 04/24/2014 per NP Mellody DanceKeith #[id number] for remaining tablets of original 04/18/2014 [facility]

## 2014-05-26 NOTE — Telephone Encounter (Signed)
Pt should fill out what paperwork he needs so that he can be seen by [facility].  This patient not getting into pain management has been ongoing.  The pmp shows patient received 20 tabs of roxicodone by another provider. Pt can't get pain meds from multiple providers.  Pt will have to wait until [facility] appt.

## 2014-05-29 NOTE — Telephone Encounter (Signed)
Patient left message to check on the status of his prescription request. He states that his number is [phone] different from number in patient's chart

## 2014-05-29 NOTE — Telephone Encounter (Signed)
Tried to reach patient ( number is not working ) .

## 2014-05-29 NOTE — Telephone Encounter (Signed)
Called patient back and explained situation  with patient that our providers are no longer willing to provide pain meds. We referred him to [facility] back in 01/2014 and he still has no apt. He states now his apt. Is on hold because he has no insurance and someone is working on some type of advocacy program for him. I told him I know this is unfortunate but it has been 5 months since he has been referred  And providers are not willing to prescribe any rxs for pain meds.

## 2014-06-04 ENCOUNTER — Inpatient Hospital Stay
Admit: 2014-06-04 | Discharge: 2014-06-05 | Disposition: A | Discharge status: Home / Self Care | Payer: Self-pay | Attending: Emergency Medicine

## 2014-06-04 DIAGNOSIS — G8929 Other chronic pain: Secondary | ICD-10-CM

## 2014-06-04 NOTE — ED Notes (Signed)
I have reviewed discharge instructions with the patient.  The patient verbalized understanding. Patient signed paper discharge, electronic signature pad not available.

## 2014-06-04 NOTE — Other (Signed)
Discharge medications reviewed with patient and appropriate educational materials and side effects teaching were provided.

## 2014-06-04 NOTE — ED Provider Notes (Signed)
HPI Comments: 6:50 PM: 34 y.o male presents to the ED c/o chronic L leg pain worsening 1 week ago. The pt reports his insurance recently ran out on him and he is unable to be seen by his PCP. Pt reports he has visited the ER for pain meds but notes he does not receive a supply long enough to help him with his pain. Pt reports he needs something for his current pain and notes he only has Gabapentin but ran out of his percocet. He reports he is working on his insurance and would like a consult with a life coach. He denies recent traumas/falls to his lower extremities or nausea, vomiting, fever, chills, CP, SOB, numbness, weakness, loss of sensation, and has no further complaints.     Patient is a 34 y.o. male presenting with leg pain. The history is provided by the patient.   Leg Pain   Pertinent negatives include no neck pain.        Past Medical History:   Diagnosis Date   ??? Asthma    ??? Sciatica    ??? Chronic pain        History reviewed. No pertinent past surgical history.      Family History:   Problem Relation Age of Onset   ??? Hypertension Mother    ??? Hypertension Father    ??? Asthma Sister        History     Social History   ??? Marital Status: SINGLE     Spouse Name: N/A   ??? Number of Children: N/A   ??? 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 Smoking status: Former Smoker -- 0.50 packs/day     Types: Cigarettes   ??? Smokeless tobacco: Never Used   ??? Alcohol Use: No      Comment: occasional "pt stated that he stop drinking"   ??? Drug Use: No   ??? Sexual Activity: Not on file     Other Topics Concern   ??? Not on file     Social History Narrative           ALLERGIES: Review of patient's allergies indicates no known allergies.      Review of Systems   Constitutional: Negative.  Negative for fever.   HENT: Negative.  Negative for congestion.    Eyes: Negative.  Negative for pain.   Respiratory: Negative.  Negative for shortness of breath.     Cardiovascular: Negative.  Negative for chest pain.   Gastrointestinal: Negative.  Negative for abdominal pain.   Genitourinary: Negative.  Negative for dysuria.   Musculoskeletal: Positive for myalgias (chronic L leg pain). Negative for neck pain.   Skin: Negative for rash.   Neurological: Negative.  Negative for headaches.   All other systems reviewed and are negative.      Filed Vitals:    06/04/14 1846   BP: 152/86   Pulse: 97   Temp: 98.2 ??F (36.8 ??C)   Resp: 16   Weight: 68.947 kg (152 lb)   SpO2: 100%            Physical Exam   Constitutional: He is oriented to person, place, and time. He appears well-developed and well-nourished. No distress.   34 year old [place] American male in no acute distress.   HENT:   Head: Normocephalic and atraumatic.   Right Ear: External ear  normal.   Left Ear: External ear normal.   Nose: Nose normal.   Mouth/Throat: Oropharynx is clear and moist. No oropharyngeal exudate.   Eyes: Conjunctivae and EOM are normal. Pupils are equal, round, and reactive to light. Right eye exhibits no discharge. Left eye exhibits no discharge. No scleral icterus.   Neck: Normal range of motion. Neck supple. No JVD present. No tracheal deviation present. No thyromegaly present.   Cardiovascular: Normal rate, regular rhythm, normal heart sounds and intact distal pulses.  Exam reveals no gallop and no friction rub.    No murmur heard.  Pulmonary/Chest: Effort normal and breath sounds normal. No stridor. No respiratory distress. He has no wheezes. He has no rales. He exhibits no tenderness.   Abdominal: Soft. Bowel sounds are normal. He exhibits no distension and no mass. There is no tenderness. There is no rebound and no guarding.   Musculoskeletal: Normal range of motion. He exhibits no edema or tenderness.   Lymphadenopathy:     He has no cervical adenopathy.   Neurological: He is alert and oriented to person, place, and time. No cranial nerve deficit. He exhibits normal muscle tone. Coordination  normal.   Skin: Skin is warm and dry. No rash noted. He is not diaphoretic. No erythema. No pallor.   Psychiatric: His behavior is normal. Judgment and thought content normal.   Agitated when asked about his frequent visit to the ED for pain.   Nursing note and vitals reviewed.       MDM  Number of Diagnoses or Management Options  Chronic pain of both lower extremities:   Diagnosis management comments: Patient medical records reviewed.  He's had multiple visits to this ED and [facility] for chronic pain.  He has no prior injury to explain why he's having chronic pain. Will discharge        Procedures    -------------------------------------------------------------------------------------------------------------------  PROGRESS NOTE:  7:08 PM: On re-evaluation the pt is feeling better. Discussed with pt at length the importance of following-up with life coach for assistance with chronic pain. He is stable for discharge home. Will discharge with Tramadol.     CONSULTATIONS:      ORDERS:  Orders Placed This Encounter   ??? traMADol (ULTRAM) 50 mg tablet         EKG INTERPRETATIONS:      RADIOLOGY RESULTS:  No orders to display        LAB RESULTS:  No results found for this or any previous visit (from the past 12 hour(s)).     DISPOSITION:  Diagnosis:   1. Chronic pain of both lower extremities          Disposition: Discharge    Follow-up Information     Follow up With Details Comments Contact Info    [facility] Schedule an appointment as soon as possible for a visit in 1 day Return to the ED, If symptoms worsen [address]  [place] [id number]  [phone]    [facility] Call in 1 day  [address]  [place] [id number]  [phone]          Current Discharge Medication List      START taking these medications    Details   traMADol (ULTRAM) 50 mg tablet Take 1 Tab by mouth every six (6) hours as needed for Pain. Max Daily Amount: 200 mg.  Qty: 50 Tab, Refills: 0  CONTINUE these medications which have NOT CHANGED    Details   gabapentin (NEURONTIN) 300 mg capsule 3 tabs po bid and 4 tabs q hs  Qty: 300 Cap, Refills: 5         STOP taking these medications       oxyCODONE-acetaminophen (PERCOCET) 7.5-325 mg per tablet Comments:   Reason for Stopping:                -------------------------------------------------------------------------------------------------------------------    SCRIBE ATTESTATION  Acquilla Macklin acting as scribes for and in the presence of Dr. Kemper Durie 6:53 PM     Provider Attestation:   I personally performed the services described in the documentation, reviewed the documentation, as recorded by the scribe in my presence, and it accurately and completely records my words and actions.   Reviewed and signed by: Llana Aliment, DO

## 2014-06-04 NOTE — ED Notes (Signed)
Pt with chronic left leg pain. Pt states pain has increased this past week.

## 2014-06-05 MED ORDER — TRAMADOL 50 MG TAB
50 mg | ORAL_TABLET | Freq: Four times a day (QID) | ORAL | Status: DC | PRN
Start: 2014-06-05 — End: 2014-06-25

## 2014-06-25 ENCOUNTER — Emergency Department: Admit: 2014-06-25 | Payer: Self-pay | Primary: Family

## 2014-06-25 ENCOUNTER — Inpatient Hospital Stay
Admit: 2014-06-25 | Discharge: 2014-06-25 | Disposition: A | Discharge status: Home / Self Care | Payer: Self-pay | Attending: Emergency Medicine

## 2014-06-25 DIAGNOSIS — G8929 Other chronic pain: Secondary | ICD-10-CM

## 2014-06-25 MED ORDER — IBUPROFEN 600 MG TAB
600 mg | ORAL_TABLET | Freq: Four times a day (QID) | ORAL | Status: DC | PRN
Start: 2014-06-25 — End: 2014-07-29

## 2014-06-25 MED ORDER — IBUPROFEN 600 MG TAB
600 mg | ORAL | Status: AC
Start: 2014-06-25 — End: 2014-06-25
  Administered 2014-06-25: 18:00:00 via ORAL

## 2014-06-25 MED FILL — IBUPROFEN 600 MG TAB: 600 mg | ORAL | Qty: 1

## 2014-06-25 NOTE — ED Provider Notes (Signed)
HPI Comments: 1:12 PM William Griffin is a 34 y.o. male with h/o sciatica who presents to the ED c/o "sharp" R knee pain (chronic). Pt thinks he may be overcompensating knee and says it "keeps locking up" and feels like he is going to fall. Pt rates pain 8/10. Pt takes Gabapentin to no relief. Pt has Rx of Oxycodone from Dr. Jean RosenthalJackson but states he ran out. Pt denies ankle pain, cough, cold, fever, abd pain. Pt reports being seen at ED 3-4 times to no improvement of sx. Pt requests to be seen referred elsewhere for financial reasons. No other complaints at this time. Denies trauma    Patient is a 34 y.o. male presenting with knee pain. The history is provided by the patient.   Knee Pain          Past Medical History:   Diagnosis Date   ??? Asthma    ??? Sciatica    ??? Chronic pain        History reviewed. No pertinent past surgical history.      Family History:   Problem Relation Age of Onset   ??? Hypertension Mother    ??? Hypertension Father    ??? Asthma Sister        History     Social History   ??? Marital Status: SINGLE     Spouse Name: N/A   ??? Number of Children: N/A   ??? 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 Smoking status: Former Smoker -- 0.50 packs/day     Types: Cigarettes   ??? Smokeless tobacco: Never Used   ??? Alcohol Use: No      Comment: occasional "pt stated that he stop drinking"   ??? Drug Use: No   ??? Sexual Activity: Not on file     Other Topics Concern   ??? Not on file     Social History Narrative           ALLERGIES: Review of patient's allergies indicates no known allergies.      Review of Systems   Constitutional: Negative for fever.   Respiratory: Negative for cough.    Gastrointestinal: Negative for abdominal pain.   Musculoskeletal:        R knee pain (chronic)   All other systems reviewed and are negative.      There were no vitals filed for this visit.         Physical Exam   Constitutional:    General:  Well-developed, well-nourished, no apparent distress  Head:  Normocephalic atraumatic  Eyes:  Pupils midrange extraocular movements intact.  No pallor or conjunctival injection  Nose:  No rhinorrhea, inspection grossly normal  Ears:  Grossly normal to inspection, no discharge  Mouth:  Mucous membrane members moist, no appreciable intraoral lesion  Neck:  Trachea midline, no asymmetry, No pain on palpation down cervical, thoracic, or lumbar spine step-off or deformity.  Chest:  Grossly normal inspection, symmetric chest rise  Pulmonary:  Clear to auscultation bilaterally no wheezes rhonchi or rales speaking in full sentences, no appreciable respiratory distress  Cardiovascular:  S1-S2 no murmurs rubs or gallops  Extremities:  Grossly normal to inspection, peripheral pulses intact.  Right knee: No abrasions or ecchymosis or joint effusion  No joint laxity with anterior posterior drawer sign and no pain on palpation of the MCL or LCL no click with  meniscal grind.  2+ dorsalis pedis pulse.  Neurologic:  Alert and oriented no appreciable focal neurologic deficit  Psychiatric:  Grossly normal mood and affect  Nursing note reviewed, vital signs reviewed.        MDM  Number of Diagnoses or Management Options  Chronic pain of right knee:   Diagnosis management comments: ED course:  The patient presents with right knee pain.  Denies any trauma but without an x-ray.  Discussed my low concern for fracture however he became irritated and accusatory of dismissive exams.  EMR was reviewed has multiple visits in the past for bilateral leg pain and sciatica.    In my opinion, patient has a chronic pain condition best managed by primary care or pain management.  I explained the emergency department's narcotic policy and why I would be unable to provide the requested treatment today. Patient will be referred back to primary care or pain management.  I explained that the emergency department will always be  willing to evaluate the patient for acute problems, and patient was invited to return with any concerns.    X-ray of the knee per my interpretation:  No fracture    life coach consult ordered.    Patient's presentation, history, physical exam and laboratory evaluations were reviewed.  At this time patient was felt to be stable for outpatient management and follow with primary care/specialist.  Patient was instructed to return to the emergency department with any concerns.    Disposition:    Discharged home      Portions of this chart were created with Dragon medical speech to text program.   Unrecognized errors may be present.        Procedures                           Scribe Attestation:   Felizardo Hoffmann acting as a scribe for and in the presence of Dr. Despina Hick, MD June 25, 2014 at 1:15 PM       Provider Attestation:   I personally performed the services described in the documentation, reviewed the documentation, as recorded by the scribe in my presence, and it accurately and completely records my words and actions.     Reviewed and signed by:  Dr. Despina Hick, MD

## 2014-06-25 NOTE — ED Notes (Signed)
Chronic rightt knee pain. Keeps locking up

## 2014-06-26 NOTE — Discharge Instructions (Signed)
[facility] referral noted for PCP. Patient has been seen by Dr. Trixie DeisLieb. I called patient and received recording that number you dialed is not a working number.

## 2014-07-12 ENCOUNTER — Encounter: Attending: Family Medicine | Primary: Family

## 2014-07-29 ENCOUNTER — Inpatient Hospital Stay
Admit: 2014-07-29 | Discharge: 2014-07-29 | Disposition: A | Discharge status: Home / Self Care | Payer: Self-pay | Attending: Emergency Medicine

## 2014-07-29 DIAGNOSIS — G8929 Other chronic pain: Secondary | ICD-10-CM

## 2014-07-29 MED ORDER — IBUPROFEN 600 MG TAB
600 mg | ORAL_TABLET | Freq: Four times a day (QID) | ORAL | Status: DC | PRN
Start: 2014-07-29 — End: 2015-07-02

## 2014-07-29 MED ORDER — GABAPENTIN 300 MG CAP
300 mg | ORAL_CAPSULE | Freq: Three times a day (TID) | ORAL | Status: DC
Start: 2014-07-29 — End: 2015-07-02

## 2014-07-29 NOTE — ED Notes (Signed)
In to talk to patient, patient upset that he was only given 12 pills of gabapentin, told patient to come up Monday to talk to Life [facility] to try to get and appointment with PCP, patient stated "Fuck this shit, you all are not doing anything for me, I'm getting the fuck out of here". Patient left

## 2014-07-29 NOTE — ED Notes (Signed)
Pt states he is having neuropathy pain in his left foot extending up to his knee

## 2014-07-29 NOTE — ED Provider Notes (Signed)
HPI Comments: 4:26 PM William Griffin is a 34 y.o. male with a history of chronic pain, sciatica and asthma who presents to ED c/o L lower leg pain chronic but worsening 2 days ago when pt ran out of pain medication. Pt explains that the pain is a 9/10 on the pain scale. He has been seen in the ED numerous times for similar complaint. He states that he has not been able to follow up with his PCP for the last 2 months. Pt denies painful urination, blood in urine, testicle pain or any other symptoms at this time. No other concerns at this time.       PCP: EMILY WHITE LIEB, MD        Patient is a 34 y.o. male presenting with leg pain. The history is provided by the patient.   Leg Pain          Past Medical History:   Diagnosis Date   ??? Asthma    ??? Sciatica    ??? Chronic pain        History reviewed. No pertinent past surgical history.      Family History:   Problem Relation Age of Onset   ??? Hypertension Mother    ??? Hypertension Father    ??? Asthma Sister        History     Social History   ??? Marital Status: SINGLE     Spouse Name: N/A   ??? Number of Children: N/A   ??? 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 Smoking status: Former Smoker -- 0.50 packs/day     Types: Cigarettes   ??? Smokeless tobacco: Never Used   ??? Alcohol Use: No      Comment: occasional "pt stated that he stop drinking"   ??? Drug Use: No   ??? Sexual Activity: Not on file     Other Topics Concern   ??? Not on file     Social History Narrative           ALLERGIES: Review of patient's allergies indicates no known allergies.      Review of Systems   Genitourinary: Negative for dysuria, hematuria and testicular pain.   Musculoskeletal: Positive for myalgias (LLE; foot and ankle).       Filed Vitals:    07/29/14 1617   BP: 140/99   Pulse: 97   Temp: 98.3 ??F (36.8 ??C)   Resp: 18   SpO2: 100%            Physical Exam   Constitutional:   General:  Well-developed, well-nourished, no apparent distress     Head:  Normocephalic atraumatic    Eyes:  Pupils midrange extraocular movements grossly intact.    Nose:  No rhinorrhea, inspection grossly normal    Ears:  Grossly normal to inspection    Mouth:  Mucous membrane members moist    Neck:  Trachea midline, no asymmetry  Chest:  Grossly normal inspection, symmetric chest rise, respirations nonlabored  Back: No tenderness palpation to midline cervical thoracic or lumbar spine  Extremities:  Grossly normal to inspection  2+ dorsalis pedis and posterior tibial pulse  Neurologic:  Alert and oriented no appreciable focal neurologic deficit completely non-ataxic and nonantalgic gait.  Psychiatric:  Grossly normal mood and affect  Nursing note reviewed, vital signs reviewed.  MDM  Number of Diagnoses or Management Options  Chronic pain of left lower extremity:   Diagnosis management comments: ED course:  Patient presents with left leg pain very typical of his normal sciatica neuropathic pain.  He has had no success in following up with pain management.  On review of EMR, has been discharged from orthopedic follow-up since he has been asking for too much oxycodone.    In my opinion, patient has a chronic pain condition best managed by primary care or pain management.  I explained the emergency department's narcotic policy and why I would be unable to provide the requested treatment today. Patient will be referred back to primary care or pain management.  I explained that the emergency department will always be willing to evaluate the patient for acute problems, and patient was invited to return with any concerns.    Patient is very irate with staff, will provide a short course of gabapentin and was once again warned that she needs to receive CONTROL SUBSTANCES FROM A PRIMARY CARE PHYSICIAN.  EMR reviewed, he had a negative MRI on 06/2013.  Has left [facility] in past and failed to follow up, with neurology.        Patient's presentation, history, physical exam and laboratory evaluations were reviewed.  At this time patient was felt to be stable for outpatient management and follow with primary care/specialist.  Patient was instructed to return to the emergency department with any concerns.    Disposition:    Discharged home      Portions of this chart were created with Dragon medical speech to text program.   Unrecognized errors may be present.        Procedures                           Scribe Attestation:     I, Lanice SchwabConstance Leandro, scribing for and in the presence of  Dr.Caidyn Henricksen Garth BignessM Aireonna Bauer, MD July 29, 2014 at 4:31 PM     Physician Attestation:   I personally performed the services described in this documentation, reviewed and edited the documentation which was dictated to the scribe in my presence, and it accurately records my words and actions. Despina Hickraig M Keyry Iracheta, MD  July 29, 2014 at 4:31 PM

## 2014-07-29 NOTE — ED Notes (Signed)
Patient DEMANDING gabapentin Rx, in to talk to Dr Sherian MaroonSharkey, will give patient Rx but needs to follow up with [facility]

## 2014-08-05 ENCOUNTER — Emergency Department (HOSPITAL_COMMUNITY)
Admission: EM | Admit: 2014-08-05 | Discharge: 2014-08-06 | Disposition: A | Discharge status: Home / Self Care | Payer: Self-pay | Attending: Emergency Medicine | Admitting: Emergency Medicine

## 2014-08-05 DIAGNOSIS — M791 Myalgia: Secondary | ICD-10-CM | POA: Insufficient documentation

## 2014-08-05 DIAGNOSIS — M5410 Radiculopathy, site unspecified: Secondary | ICD-10-CM | POA: Insufficient documentation

## 2014-08-06 ENCOUNTER — Encounter (HOSPITAL_COMMUNITY): Payer: Self-pay | Admitting: *Deleted

## 2014-08-06 MED ORDER — NAPROXEN 250 MG PO TABS: ORAL_TABLET | ORAL | Status: DC

## 2014-08-06 MED ORDER — PREDNISONE 20 MG PO TABS: ORAL_TABLET | ORAL | Status: DC

## 2014-08-06 MED ORDER — DEXAMETHASONE SODIUM PHOSPHATE 4 MG/ML IJ SOLN
10.0000 mg | Freq: Once | INTRAMUSCULAR | Status: AC
  Administered 2014-08-06: 10 mg via INTRAVENOUS
  Filled 2014-08-06: qty 3

## 2014-08-06 MED ORDER — CYCLOBENZAPRINE HCL 5 MG PO TABS: 5.0000 mg | ORAL_TABLET | Freq: Three times a day (TID) | ORAL | Status: DC | PRN

## 2014-08-06 MED ORDER — CYCLOBENZAPRINE HCL 10 MG PO TABS
10.0000 mg | ORAL_TABLET | Freq: Once | ORAL | Status: AC
  Administered 2014-08-06: 10 mg via ORAL
  Filled 2014-08-06: qty 1

## 2014-08-06 NOTE — ED Provider Notes (Signed)
CSN: [id number]     Arrival date & time 08/05/14  2354 History  This chart was scribed for Devoria AlbeIva Kinsler Soeder, MD by Modena JanskyAlbert Thayil, ED Scribe. This patient was seen in room APA04/APA04 and the patient's care was started at 12:17 AM.   Chief Complaint  Patient presents with  . Leg Pain   The history is provided by the patient. No language interpreter was used.  HPI Comments: Coralie CarpenBrent Wisehart is a 34 y.o. male who presents to the Emergency Department complaining of intermittent moderate LLE pain that started a couple of days ago. He reports that the pain started off as an intermittent sore sensation in his left foot that worsened. He denies any known injury. He reports that the pain radiates to his left knee and left hip. He describes the pain as a sharp sensation. The sharp pain rapidly recurs in succession.  He states that he has associated paresthesia in his left foot and LLE weakness . He reports that his knee buckles during ambulation, but can still ambulate. He states that he has a hx of a pinched nerve in his left foot from about a year ago. He was on medication for it and stopped about 6 months ago. 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 He denies any hx of smoking or drinking. He also denies any back pain. Pt states [place] [facility] [place] with his brother 3 days ago and he was having the problem in his left leg before the trip.  No chest pain or shortness of breath. No incontinence.    PCP none  History reviewed. No pertinent past medical history. History reviewed. No pertinent past surgical history. No family history on file. History  Substance Use Topics  . Smoking status: Never Smoker   . Smokeless tobacco: Not on file  . Alcohol Use: No  employed driving forklift  Review of Systems  Musculoskeletal: Positive for myalgias (LLE). Negative for back pain.  Neurological: Positive for weakness and numbness.  All other systems reviewed and are  negative.   Allergies  Review of patient's allergies indicates no known allergies.  Home Medications   Prior to Admission medications   Medication Sig Start Date End Date Taking? Authorizing Provider  cyclobenzaprine (FLEXERIL) 5 MG tablet Take 1 tablet (5 mg total) by mouth 3 (three) times daily as needed (muscle pain). 08/06/14   Devoria AlbeIva Ramez Arrona, MD  naproxen (NAPROSYN) 250 MG tablet Take 1 po BID with food prn pain 08/06/14   Devoria AlbeIva Brandilee Pies, MD  predniSONE (DELTASONE) 20 MG tablet Take 3 po QD x 3d , then 2 po QD x 3d then 1 po QD x 3d 08/06/14   Devoria AlbeIva Halford Goetzke, MD   BP 120/103 mmHg  Pulse 64  Temp(Src) 98 F (36.7 C) (Oral)  Resp 16  Ht 5\' 4"  (1.626 m)  Wt 150 lb (68.04 kg)  BMI 25.73 kg/m2  SpO2 100%  Vital signs normal except for hypertension  Physical Exam  Constitutional: He is oriented to person, place, and time. He appears well-developed and well-nourished.  Non-toxic appearance. He does not appear ill. No distress.  HENT:  Head: Normocephalic and atraumatic.  Right Ear: External ear normal.  Left Ear: External ear normal.  Nose: Nose normal. No mucosal edema or rhinorrhea.  Mouth/Throat: Oropharynx is clear and moist and mucous membranes are normal. No dental abscesses or uvula swelling.  Eyes: Conjunctivae and EOM are normal. Pupils are equal, round, and reactive to  light.  Neck: Normal range of motion and full passive range of motion without pain. Neck supple.  Cardiovascular: Normal rate, regular rhythm and normal heart sounds.  Exam reveals no gallop and no friction rub.   No murmur heard. Pulmonary/Chest: Effort normal and breath sounds normal. No respiratory distress. He has no wheezes. He has no rhonchi. He has no rales. He exhibits no tenderness and no crepitus.  Abdominal: Soft. Normal appearance and bowel sounds are normal. He exhibits no distension. There is no tenderness. There is no rebound and no guarding.  Musculoskeletal: Normal range of motion. He exhibits no edema or  tenderness.  LLE: No popliteal swelling. Good pulses. Callus on great toe. Decreased sensation to light touch to left foot diffusely, left lower leg diffusely, lateral upper lower leg. Tender over the lateral left hip with ? Sciatic notch tenderness.  nontender spine to palpation.   Moves all extremities well.   Neurological: He is alert and oriented to person, place, and time. He has normal strength. No cranial nerve deficit.  Skin: Skin is warm, dry and intact. No rash noted. No erythema. No pallor.  Psychiatric: He has a normal mood and affect. His speech is normal and behavior is normal. His mood appears not anxious.  Nursing note and vitals reviewed.   ED Course  Procedures (including critical care time)  Medications  dexamethasone (DECADRON) injection 10 mg (10 mg Intravenous Given 08/06/14 0108)  cyclobenzaprine (FLEXERIL) tablet 10 mg (10 mg Oral Given 08/06/14 0108)    DIAGNOSTIC STUDIES: Oxygen Saturation is 100% on RA, normal by my interpretation.    COORDINATION OF CARE: 12:21 AM- Pt advised of plan for treatment which includes medication and pt agrees.  Review of the [place] had no entries, review of the [place] database to include [place] shows 23 narcotic prescriptions written in the past year for a person with the same name and birthday with addresses in [facility]. These prescriptions were written by 14 providers and were filled in 9 different drugstores in [place] area. Patient states that he was living in [place] however he separated from his wife and child and he frequently goes back to visit. He states he is only been in [place] for about 6 months.  Labs Review Labs Reviewed - No data to display  Imaging Review No results found.   EKG Interpretation None      MDM   Final diagnoses:  Radiculopathy of leg   New Prescriptions   CYCLOBENZAPRINE (FLEXERIL) 5 MG TABLET    Take 1 tablet (5 mg total) by mouth 3 (three) times daily  as needed (muscle pain).   NAPROXEN (NAPROSYN) 250 MG TABLET    Take 1 po BID with food prn pain   PREDNISONE (DELTASONE) 20 MG TABLET    Take 3 po QD x 3d , then 2 po QD x 3d then 1 po QD x 3d    Plan discharge  Devoria Albe, MD, FACEP   I personally performed the services described in this documentation, which was scribed in my presence. The recorded information has been reviewed and considered.  Devoria Albe, MD, Concha Pyo, MD 08/06/14 [phone]

## 2014-08-06 NOTE — ED Notes (Signed)
Pt c/o left leg pain that is described as sharp shocking pain, denies any injury.

## 2014-08-06 NOTE — Discharge Instructions (Signed)
Take the medications as prescribed. You can follow up with one of the orthopedists in [place] if you aren't improving.Call and make an appointment.  Recheck if you get chest pain, shortness of breath, back pain or seem worse.    Radicular Pain Radicular pain in either the arm or leg is usually from a bulging or herniated disk in the spine. A piece of the herniated disk may press against the nerves as the nerves exit the spine. This causes pain which is felt at the tips of the nerves down the arm or leg. Other causes of radicular pain may include:  Fractures.  Heart disease.  Cancer.  An abnormal and usually degenerative state of the nervous system or nerves (neuropathy). Diagnosis may require CT or MRI scanning to determine the primary cause.  Nerves that start at the neck (nerve roots) may cause radicular pain in the outer shoulder and arm. It can spread down to the thumb and fingers. The symptoms vary depending on which nerve root has been affected. In most cases radicular pain improves with conservative treatment. Neck problems may require physical therapy, a neck collar, or cervical traction. Treatment may take many weeks, and surgery may be considered if the symptoms do not improve.  Conservative treatment is also recommended for sciatica. Sciatica causes pain to radiate from the lower back or buttock area down the leg into the foot. Often there is a history of back problems. Most patients with sciatica are better after 2 to 4 weeks of rest and other supportive care. Short term bed rest can reduce the disk pressure considerably. Sitting, however, is not a good position since this increases the pressure on the disk. You should avoid bending, lifting, and all other activities which make the problem worse. Traction can be used in severe cases. Surgery is usually reserved for patients who do not improve within the first months of treatment. Only take over-the-counter or prescription medicines for  pain, discomfort, or fever as directed by your caregiver. Narcotics and muscle relaxants may help by relieving more severe pain and spasm and by providing mild sedation. Cold or massage can give significant relief. Spinal manipulation is not recommended. It can increase the degree of disc protrusion. Epidural steroid injections are often effective treatment for radicular pain. These injections deliver medicine to the spinal nerve in the space between the protective covering of the spinal cord and back bones (vertebrae). Your caregiver can give you more information about steroid injections. These injections are most effective when given within two weeks of the onset of pain.  You should see your caregiver for follow up care as recommended. A program for neck and back injury rehabilitation with stretching and strengthening exercises is an important part of management.  SEEK IMMEDIATE MEDICAL CARE IF:  You develop increased pain, weakness, or numbness in your arm or leg.  You develop difficulty with bladder or bowel control.  You develop abdominal pain. Document Released: 05/08/2004 Document Revised: 06/23/2011 Document Reviewed: 07/24/2008 [facility] Patient Information 2015 [employer] [place]. This information is not intended to replace advice given to you by your health care provider. Make sure you discuss any questions you have with your health care provider.

## 2014-08-14 ENCOUNTER — Telehealth: Payer: Self-pay | Admitting: Orthopedic Surgery

## 2014-08-14 NOTE — Telephone Encounter (Signed)
[facility] patient [facility] "nerve pain in leg" / radicular pain.  Discussed appointment and relayed that Dr Romeo AppleHarrison may recommend primary care evaluation and possible referral to another specialist;  Patient states just moved to the area, and was unsure what his insurance is;  States will hold and call back when he finds out about his insurance.

## 2014-08-17 ENCOUNTER — Emergency Department (HOSPITAL_COMMUNITY)
Admission: EM | Admit: 2014-08-17 | Discharge: 2014-08-17 | Disposition: A | Discharge status: Home / Self Care | Payer: No Typology Code available for payment source | Attending: Emergency Medicine | Admitting: Emergency Medicine

## 2014-08-17 ENCOUNTER — Encounter (HOSPITAL_COMMUNITY): Payer: Self-pay | Admitting: Emergency Medicine

## 2014-08-17 DIAGNOSIS — Z791 Long term (current) use of non-steroidal anti-inflammatories (NSAID): Secondary | ICD-10-CM | POA: Diagnosis not present

## 2014-08-17 DIAGNOSIS — G8929 Other chronic pain: Secondary | ICD-10-CM | POA: Diagnosis not present

## 2014-08-17 DIAGNOSIS — M79605 Pain in left leg: Secondary | ICD-10-CM | POA: Insufficient documentation

## 2014-08-17 DIAGNOSIS — Z79899 Other long term (current) drug therapy: Secondary | ICD-10-CM | POA: Diagnosis not present

## 2014-08-17 DIAGNOSIS — Z7952 Long term (current) use of systemic steroids: Secondary | ICD-10-CM | POA: Diagnosis not present

## 2014-08-17 HISTORY — DX: Dorsalgia, unspecified: M54.9

## 2014-08-17 HISTORY — DX: Other chronic pain: G89.29

## 2014-08-17 HISTORY — DX: Paresthesia of skin: R20.2

## 2014-08-17 HISTORY — DX: Pain in leg, unspecified: M79.606

## 2014-08-17 MED ORDER — GABAPENTIN 100 MG PO CAPS: 100.0000 mg | ORAL_CAPSULE | Freq: Three times a day (TID) | ORAL | Status: DC

## 2014-08-17 NOTE — ED Notes (Signed)
Pt states pain to left leg x 7 months but began hurting worse recently. Pt states "neuropathy" pain.

## 2014-08-17 NOTE — ED Provider Notes (Signed)
CSN: [id number]     Arrival date & time 08/17/14  1424 History   First MD Initiated Contact with Patient 08/17/14 [phone]     Chief Complaint  Patient presents with  . Leg Pain     (Consider location/radiation/quality/duration/timing/severity/associated sxs/prior Treatment) HPI David Davies is a 34 y.o. male who presents to the ED with chronic left leg pain. He was last evaluated here 4/23 with same problem. At that visit the [place] database was reviewed and showed no entries, however, the [place] database showed 23 narcotic prescriptions written in the past year by 14 providers and filled at 9 different pharmacies. Patient reports that he has neuropathic pain. He reports that the medications that he was prescribed on his previous visit were the wrong ones. He did not fill the prednisone Rx. He states he is taking ibuprofen without relief. Patient states that his doctor in the past had him on oxycodone for his pain. He has also had Neurontin that he request refill for.   Past Medical History  Diagnosis Date  . Chronic leg pain     left  . Left leg paresthesias   . Chronic pain   . Chronic back pain    History reviewed. No pertinent past surgical history. No family history on file. History  Substance Use Topics  . Smoking status: Never Smoker   . Smokeless tobacco: Not on file  . Alcohol Use: No    Review of Systems Negative except as stated in HPI   Allergies  Review of patient's allergies indicates no known allergies.  Home Medications   Prior to Admission medications   Medication Sig Start Date End Date Taking? Authorizing Provider  cyclobenzaprine (FLEXERIL) 5 MG tablet Take 1 tablet (5 mg total) by mouth 3 (three) times daily as needed (muscle pain). 08/06/14   Devoria AlbeIva Knapp, MD  gabapentin (NEURONTIN) 100 MG capsule Take 1 capsule (100 mg total) by mouth 3 (three) times daily. 08/17/14   Hope Orlene OchM Neese, NP  naproxen (NAPROSYN) 250 MG tablet Take 1 po BID with food prn pain 08/06/14    Devoria AlbeIva Knapp, MD  predniSONE (DELTASONE) 20 MG tablet Take 3 po QD x 3d , then 2 po QD x 3d then 1 po QD x 3d 08/06/14   Devoria AlbeIva Knapp, MD   BP 135/99 mmHg  Pulse 79  Temp(Src) 98 F (36.7 C) (Oral)  Resp 14  Ht 5\' 4"  (1.626 m)  Wt 150 lb (68.04 kg)  BMI 25.73 kg/m2  SpO2 100% Physical Exam  Constitutional: He is oriented to person, place, and time. He appears well-developed and well-nourished. No distress.  HENT:  Head: Normocephalic and atraumatic.  Eyes: EOM are normal. Pupils are equal, round, and reactive to light.  Neck: Normal range of motion. Neck supple.  Cardiovascular: Normal rate and regular rhythm.   Pulmonary/Chest: Effort normal. No respiratory distress. He has no wheezes. He has no rales.  Abdominal: Soft. Bowel sounds are normal. There is no tenderness.  Musculoskeletal: Normal range of motion. He exhibits no edema.  Patient moves from sitting to standing position very quickly and does not appear to have any distress. He is ambulatory without difficulty and without foot drag.   Neurological: He is alert and oriented to person, place, and time. He has normal strength. No cranial nerve deficit or sensory deficit. Coordination and gait normal.  Reflex Scores:      Bicep reflexes are 2+ on the right side and 2+ on the left side.  Brachioradialis reflexes are 2+ on the right side and 2+ on the left side.      Patellar reflexes are 2+ on the right side and 2+ on the left side.      Achilles reflexes are 2+ on the right side and 2+ on the left side. Skin: Skin is warm and dry.  Psychiatric: He has a normal mood and affect. His behavior is normal.  Nursing note and vitals reviewed.   ED Course  Procedures (including critical care time) Labs Review  MDM  34 y.o. male with hx of chronic pain stable for discharge without change from previous visit. Discussed need for follow up as suggested at previous visit. Will give Rx for Neurontin that the patient states he has had  previously for his neuropathic pain.   Final diagnoses:  Chronic pain       Janne NapoleonHope M Neese, NP 08/17/14 1627  Margarita Grizzleanielle Ray, MD 08/17/14 2011

## 2014-08-17 NOTE — Discharge Instructions (Signed)
Call [facility] for an appointment for your primary care.

## 2014-08-20 ENCOUNTER — Emergency Department (HOSPITAL_COMMUNITY)
Admission: EM | Admit: 2014-08-20 | Discharge: 2014-08-20 | Disposition: A | Discharge status: Home / Self Care | Payer: No Typology Code available for payment source | Attending: Emergency Medicine | Admitting: Emergency Medicine

## 2014-08-20 ENCOUNTER — Encounter (HOSPITAL_COMMUNITY): Payer: Self-pay | Admitting: Emergency Medicine

## 2014-08-20 DIAGNOSIS — G8929 Other chronic pain: Secondary | ICD-10-CM | POA: Diagnosis not present

## 2014-08-20 DIAGNOSIS — M79605 Pain in left leg: Secondary | ICD-10-CM

## 2014-08-20 DIAGNOSIS — M79662 Pain in left lower leg: Secondary | ICD-10-CM | POA: Insufficient documentation

## 2014-08-20 DIAGNOSIS — G629 Polyneuropathy, unspecified: Secondary | ICD-10-CM | POA: Insufficient documentation

## 2014-08-20 MED ORDER — KETOROLAC TROMETHAMINE 30 MG/ML IJ SOLN
30.0000 mg | Freq: Once | INTRAMUSCULAR | Status: AC
  Administered 2014-08-20: 30 mg via INTRAMUSCULAR
  Filled 2014-08-20: qty 1

## 2014-08-20 MED ORDER — GABAPENTIN 300 MG PO CAPS: 300.0000 mg | ORAL_CAPSULE | Freq: Three times a day (TID) | ORAL | Status: DC

## 2014-08-20 MED ORDER — GABAPENTIN 300 MG PO CAPS
300.0000 mg | ORAL_CAPSULE | Freq: Once | ORAL | Status: AC
  Administered 2014-08-20: 300 mg via ORAL
  Filled 2014-08-20: qty 1

## 2014-08-20 MED ORDER — TRAMADOL HCL 50 MG PO TABS: 50.0000 mg | ORAL_TABLET | Freq: Four times a day (QID) | ORAL | Status: DC | PRN

## 2014-08-20 NOTE — ED Provider Notes (Signed)
CSN: [id number]     Arrival date & time 08/20/14  1022 History   First MD Initiated Contact with Patient 08/20/14 1026     Chief Complaint  Patient presents with  . Peripheral Neuropathy     (Consider location/radiation/quality/duration/timing/severity/associated sxs/prior Treatment) HPI Patient presents today as after being evaluated at our affiliated facility, now with concern of ongoing left lower extremity pain. Patient has a history of pain in the area, with no clear precipitant. Since onset several months ago the pain has been persistent, sharp, severe, throughout the left lower extremity from the foot to the knee. No new characteristics, no new swelling, no new loss of sensation. No other new complaints, no dyspnea, chest pain. Patient has moved here from [place], has run out of medication. Patient acknowledges seeing multiple providers in [place] prior to moving here. Patient has primary care scheduled in 2 days.   Past Medical History  Diagnosis Date  . Chronic leg pain     left  . Left leg paresthesias   . Chronic pain   . Chronic back pain    No past surgical history on file. No family history on file. History  Substance Use Topics  . Smoking status: Never Smoker   . Smokeless tobacco: Not on file  . Alcohol Use: No    Review of Systems  Constitutional: Negative for fever and chills.  Musculoskeletal:       History of present illness  Skin: Negative for color change.  Neurological: Positive for numbness.      Allergies  Review of patient's allergies indicates no known allergies.  Home Medications   Prior to Admission medications   Medication Sig Start Date End Date Taking? Authorizing Provider  gabapentin (NEURONTIN) 300 MG capsule Take 1 capsule (300 mg total) by mouth 3 (three) times daily. 08/20/14   Gerhard Munchobert Melinna Linarez, MD  naproxen (NAPROSYN) 250 MG tablet Take 1 po BID with food prn pain 08/06/14   Devoria AlbeIva Knapp, MD  predniSONE (DELTASONE) 20 MG tablet  Take 3 po QD x 3d , then 2 po QD x 3d then 1 po QD x 3d 08/06/14   Devoria AlbeIva Knapp, MD  traMADol (ULTRAM) 50 MG tablet Take 1 tablet (50 mg total) by mouth every 6 (six) hours as needed. 08/20/14   Gerhard Munchobert Jovie Swanner, MD   BP 144/96 mmHg  Pulse 105  Temp(Src) 98.9 F (37.2 C) (Oral)  Resp 18  SpO2 100% Physical Exam  Constitutional: He is oriented to person, place, and time. He appears well-developed. No distress.  HENT:  Head: Normocephalic and atraumatic.  Eyes: Conjunctivae and EOM are normal.  Pulmonary/Chest: No stridor.  Musculoskeletal: He exhibits no edema.  Neurological: He is alert and oriented to person, place, and time.  Patient ambulatory, without antalgic gait, no gross weakness in any extremities, cranial nerves appropriate, speech clear.   Skin: Skin is warm and dry.  Psychiatric: He has a normal mood and affect.  Nursing note and vitals reviewed.   ED Course  Procedures (including critical care time) Chart review demonstrates recent ED visit, some concern for narcotics provided by multiple providers.  MDM   Final diagnoses:  Pain of left lower extremity   patient presents with concern of ongoing pain in left upper extremity, but no evidence for new neurologic deficits, chemotherapy significant instability. After a lengthy conversation about her chronic pain management, patient was discharged with a course of gabapentin, is to medication, as well as tramadol, which has worked in the past,  to follow-up with primary care in 2 days.  Gerhard Munchobert Mateo Overbeck, MD 08/20/14 1054

## 2014-08-20 NOTE — Discharge Instructions (Signed)
As discussed, with your ongoing leg pain it is very important to establish primary care in 2 days.   Cryotherapy Cryotherapy means treatment with cold. Ice or gel packs can be used to reduce both pain and swelling. Ice is the most helpful within the first 24 to 48 hours after an injury or flare-up from overusing a muscle or joint. Sprains, strains, spasms, burning pain, shooting pain, and aches can all be eased with ice. Ice can also be used when recovering from surgery. Ice is effective, has very few side effects, and is safe for most people to use. PRECAUTIONS  Ice is not a safe treatment option for people with:  Raynaud phenomenon. This is a condition affecting small blood vessels in the extremities. Exposure to cold may cause your problems to return.  Cold hypersensitivity. There are many forms of cold hypersensitivity, including:  Cold urticaria. Red, itchy hives appear on the skin when the tissues begin to warm after being iced.  Cold erythema. This is a red, itchy rash caused by exposure to cold.  Cold hemoglobinuria. Red blood cells break down when the tissues begin to warm after being iced. The hemoglobin that carry oxygen are passed into the urine because they cannot combine with blood proteins fast enough.  Numbness or altered sensitivity in the area being iced. If you have any of the following conditions, do not use ice until you have discussed cryotherapy with your caregiver:  Heart conditions, such as arrhythmia, angina, or chronic heart disease.  High blood pressure.  Healing wounds or open skin in the area being iced.  Current infections.  Rheumatoid arthritis.  Poor circulation.  Diabetes. Ice slows the blood flow in the region it is applied. This is beneficial when trying to stop inflamed tissues from spreading irritating chemicals to surrounding tissues. However, if you expose your skin to cold temperatures for too long or without the proper protection, you can  damage your skin or nerves. Watch for signs of skin damage due to cold. HOME CARE INSTRUCTIONS Follow these tips to use ice and cold packs safely.  Place a dry or damp towel between the ice and skin. A damp towel will cool the skin more quickly, so you may need to shorten the time that the ice is used.  For a more rapid response, add gentle compression to the ice.  Ice for no more than 10 to 20 minutes at a time. The bonier the area you are icing, the less time it will take to get the benefits of ice.  Check your skin after 5 minutes to make sure there are no signs of a poor response to cold or skin damage.  Rest 20 minutes or more between uses.  Once your skin is numb, you can end your treatment. You can test numbness by very lightly touching your skin. The touch should be so light that you do not see the skin dimple from the pressure of your fingertip. When using ice, most people will feel these normal sensations in this order: cold, burning, aching, and numbness.  Do not use ice on someone who cannot communicate their responses to pain, such as small children or people with dementia. HOW TO MAKE AN ICE PACK Ice packs are the most common way to use ice therapy. Other methods include ice massage, ice baths, and cryosprays. Muscle creams that cause a cold, tingly feeling do not offer the same benefits that ice offers and should not be used as a substitute unless  recommended by your caregiver. To make an ice pack, do one of the following:  Place crushed ice or a bag of frozen vegetables in a sealable plastic bag. Squeeze out the excess air. Place this bag inside another plastic bag. Slide the bag into a pillowcase or place a damp towel between your skin and the bag.  Mix 3 parts water with 1 part rubbing alcohol. Freeze the mixture in a sealable plastic bag. When you remove the mixture from the freezer, it will be slushy. Squeeze out the excess air. Place this bag inside another plastic bag.  Slide the bag into a pillowcase or place a damp towel between your skin and the bag. SEEK MEDICAL CARE IF:  You develop white spots on your skin. This may give the skin a blotchy (mottled) appearance.  Your skin turns blue or pale.  Your skin becomes waxy or hard.  Your swelling gets worse. MAKE SURE YOU:   Understand these instructions.  Will watch your condition.  Will get help right away if you are not doing well or get worse. Document Released: 11/25/2010 Document Revised: 08/15/2013 Document Reviewed: 11/25/2010 [facility] Patient Information 2015 [employer] [place]. This information is not intended to replace advice given to you by your health care provider. Make sure you discuss any questions you have with your health care provider.

## 2014-08-20 NOTE — ED Notes (Signed)
Pt states that he has had neuropathy x 8 months.  Worse since Tuesday.  Pt questioned about going to [facility] a few days ago.  Pt seemed surprised to know that this could be seen in his chart.  States that he was given gabapentin but this won't help him and he needs some "strong pain medication".  Pt educated about the danger of having narcotics prescribed in the ER.

## 2014-08-22 ENCOUNTER — Ambulatory Visit: Payer: No Typology Code available for payment source | Admitting: Family

## 2014-08-22 ENCOUNTER — Encounter: Payer: Self-pay | Admitting: Internal Medicine

## 2014-08-22 ENCOUNTER — Ambulatory Visit (INDEPENDENT_AMBULATORY_CARE_PROVIDER_SITE_OTHER): Payer: No Typology Code available for payment source | Admitting: Internal Medicine

## 2014-08-22 ENCOUNTER — Other Ambulatory Visit: Payer: Self-pay | Admitting: Internal Medicine

## 2014-08-22 ENCOUNTER — Ambulatory Visit (INDEPENDENT_AMBULATORY_CARE_PROVIDER_SITE_OTHER)
Admission: RE | Admit: 2014-08-22 | Discharge: 2014-08-22 | Disposition: A | Discharge status: Home / Self Care | Payer: No Typology Code available for payment source | Source: Ambulatory Visit | Attending: Internal Medicine | Admitting: Internal Medicine

## 2014-08-22 ENCOUNTER — Other Ambulatory Visit (INDEPENDENT_AMBULATORY_CARE_PROVIDER_SITE_OTHER): Payer: No Typology Code available for payment source

## 2014-08-22 VITALS — BP 128/100 | HR 105 | Temp 102.4°F | Resp 15 | Ht 64.0 in | Wt 153.8 lb

## 2014-08-22 DIAGNOSIS — G629 Polyneuropathy, unspecified: Secondary | ICD-10-CM | POA: Diagnosis not present

## 2014-08-22 DIAGNOSIS — D649 Anemia, unspecified: Secondary | ICD-10-CM

## 2014-08-22 DIAGNOSIS — R4689 Other symptoms and signs involving appearance and behavior: Secondary | ICD-10-CM | POA: Insufficient documentation

## 2014-08-22 DIAGNOSIS — G894 Chronic pain syndrome: Secondary | ICD-10-CM

## 2014-08-22 DIAGNOSIS — R509 Fever, unspecified: Secondary | ICD-10-CM

## 2014-08-22 DIAGNOSIS — R000 Tachycardia, unspecified: Secondary | ICD-10-CM

## 2014-08-22 DIAGNOSIS — R778 Other specified abnormalities of plasma proteins: Secondary | ICD-10-CM

## 2014-08-22 LAB — CBC WITH DIFFERENTIAL/PLATELET
BASOS ABS: 0 10*3/uL (ref 0.0–0.1)
Basophils Relative: 0 % (ref 0.0–3.0)
EOS PCT: 0.1 % (ref 0.0–5.0)
Eosinophils Absolute: 0 10*3/uL (ref 0.0–0.7)
HCT: 36.8 % — ABNORMAL LOW (ref 39.0–52.0)
HEMOGLOBIN: 11.9 g/dL — AB (ref 13.0–17.0)
Lymphocytes Relative: 17.4 % (ref 12.0–46.0)
Lymphs Abs: 1 10*3/uL (ref 0.7–4.0)
MCHC: 32.2 g/dL (ref 30.0–36.0)
MCV: 78.9 fl (ref 78.0–100.0)
Monocytes Absolute: 0.6 10*3/uL (ref 0.1–1.0)
Monocytes Relative: 10.4 % (ref 3.0–12.0)
NEUTROS PCT: 72.1 % (ref 43.0–77.0)
Neutro Abs: 4.1 10*3/uL (ref 1.4–7.7)
PLATELETS: 215 10*3/uL (ref 150.0–400.0)
RBC: 4.66 Mil/uL (ref 4.22–5.81)
RDW: 16.1 % — AB (ref 11.5–15.5)
WBC: 5.6 10*3/uL (ref 4.0–10.5)

## 2014-08-22 LAB — BASIC METABOLIC PANEL
BUN: 10 mg/dL (ref 6–23)
CO2: 27 mEq/L (ref 19–32)
Calcium: 9.6 mg/dL (ref 8.4–10.5)
Chloride: 98 mEq/L (ref 96–112)
Creatinine, Ser: 0.85 mg/dL (ref 0.40–1.50)
GFR: 133.59 mL/min (ref >60.00)
Glucose, Bld: 96 mg/dL (ref 70–99)
POTASSIUM: 4.3 meq/L (ref 3.5–5.1)
SODIUM: 132 meq/L — AB (ref 135–145)

## 2014-08-22 LAB — URINALYSIS
Bilirubin Urine: NEGATIVE
Hgb urine dipstick: NEGATIVE
Ketones, ur: NEGATIVE
LEUKOCYTES UA: NEGATIVE
Nitrite: NEGATIVE
SPECIFIC GRAVITY, URINE: 1.01 (ref 1.000–1.030)
Total Protein, Urine: NEGATIVE
Urine Glucose: NEGATIVE
Urobilinogen, UA: 0.2 (ref 0.0–1.0)
pH: 7 (ref 5.0–8.0)

## 2014-08-22 LAB — HEPATIC FUNCTION PANEL
ALBUMIN: 3.9 g/dL (ref 3.5–5.2)
ALT: 24 U/L (ref 0–53)
AST: 28 U/L (ref 0–37)
Alkaline Phosphatase: 80 U/L (ref 39–117)
Bilirubin, Direct: 0.1 mg/dL (ref 0.0–0.3)
TOTAL PROTEIN: 8.6 g/dL — AB (ref 6.0–8.3)
Total Bilirubin: 0.6 mg/dL (ref 0.2–1.2)

## 2014-08-22 LAB — CK: Total CK: 404 U/L — ABNORMAL HIGH (ref 7–232)

## 2014-08-22 MED ORDER — TRAMADOL HCL 50 MG PO TABS: 50.0000 mg | ORAL_TABLET | Freq: Three times a day (TID) | ORAL | Status: DC | PRN

## 2014-08-22 MED ORDER — GABAPENTIN 300 MG PO CAPS: 300.0000 mg | ORAL_CAPSULE | Freq: Three times a day (TID) | ORAL | Status: DC

## 2014-08-22 NOTE — Progress Notes (Signed)
Pre visit review using our clinic review tool, if applicable. No additional management support is needed unless otherwise documented below in the visit note. 

## 2014-08-22 NOTE — Telephone Encounter (Signed)
Have called patient back since this original date multiple times; phone# is either busy, as if non-working, or someone answers who cannot hear me, and I can barely hear them; therefore, have not been able to connect with patient.

## 2014-08-22 NOTE — Patient Instructions (Signed)
  Your next office appointment will be determined based upon review of your pending labs  and  xrays Those instructions will be transmitted to you by mail for your records. Critical results will be called. Followup as needed for any active or acute issue. Please report any significant change in your symptoms.  NSAIDS ( Aleve, Advil, Naproxen) or Tylenol every 4 hrs as needed for fever as discussed based on label recommendations

## 2014-08-22 NOTE — Progress Notes (Signed)
Subjective:    Patient ID: David Davies, male    DOB: [birth year], 34 y.o.   MRN: [id number]  HPI  He was referred from the emergency room after being seen 08/20/14 for chronic pain due to "neuropathy". He was given 15 tramadol in the emergency room; he has four left.  He has moved from [place] and is seeking treatment for chronic pain in the context of peripheral neuropathy. He states that he's had an extensive evaluation by a Neurologist in [place]. There is some question whether his symptoms were related to being in enclosed spaces working as a Psychologist, occupationalwelder in [place]. He stated he was in the hospital for a week. Initially they thought he might have sciatica  a diagnosis of neuropathy was made. He stated this is associated with foot drop and required that he wear a brace @ one time. The symptoms are worse in the left foot but also present on the right. He also has some symptoms in his right hand laterally on occasion. The pain is described as "shocking". It affects his gait which is described as unstable and requiring he "hold on" to objects when walking. In addition to gabapentin 300 mg every 8 hours he's been taking tramadol and apparently a narcotic pain medication..  He has [facility] 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  He was unaware that he had a fever and has no localizing signs. He has had some frequent urination. He also has had some bitemporal headaches but this typically occurs when he has his neuropathic pain.   Review of Systems  Frontal headache, facial pain , nasal purulence, dental pain, sore throat , otic pain or otic discharge denied. Cough, sputum production, hemoptysis, or pleuritic pain denied. No diarrhea present.Cola colored urine or clay colored stools denied. Dysuria, pyuria, or hematuria not present. Red in stool 2 days ago; no melena. No new rashes, pustules, vesicles. No  redness or swelling of joints. No significant travel, pets, or tick exposures.  He denies frank incontinence of urine or stool.      Objective:   Physical Exam  Pertinent or positive findings include:  He appears healthy & well-nourished.  Wax present in both ear canals occluding visualization of tympanic membranes.  There is erythema of the nares w/o exudate.  Cervical range of motion is full.  He exhibits a resting tach with slight flow murmur.  Breath sounds are decreased.  His gait is somewhat broad and slapping.  He states he's unable to walk on his heels.  Deep tendon reflexes are 0+ at the knees.  Eyes: No conjunctival inflammation or scleral icterus is present. Oral exam:  Lips and gums are healthy appearing.There is no oropharyngeal erythema or exudate noted. Dental hygiene is good. Heart:  Normal rate and regular rhythm. S1 and S2 normal without gallop, murmur, click, rub or other extra sounds   Lungs:Chest clear to auscultation; no wheezes, rhonchi,rales ,or rubs present.No increased work of breathing.  Abdomen: bowel sounds normal, soft and non-tender without masses, organomegaly or hernias noted.  No guarding or rebound. No flank tenderness to percussion. Vascular : all pulses equal ; no bruits present. Skin:Warm & dry.  Intact without suspicious lesions or rashes ; no tenting  Lymphatic: No lymphadenopathy is noted about the head, neck, axilla Neuro: Strength, tone  Normal. Psych: oriented X 3. Exhibits "la belle indifference" in reference to the fever & tachycardia, asking no questions &  seemingly unpreturbed.His main concern was pain medication to supplement the Gabapentin        Assessment & Plan:  #1 peripheral neuropathy  #2 fever without localizing signs and symptoms  #3 tachycardia in context of above

## 2014-08-23 ENCOUNTER — Ambulatory Visit: Payer: No Typology Code available for payment source | Admitting: Internal Medicine

## 2014-08-24 LAB — URINE CULTURE
Colony Count: NO GROWTH
ORGANISM ID, BACTERIA: NO GROWTH

## 2014-08-28 ENCOUNTER — Other Ambulatory Visit (INDEPENDENT_AMBULATORY_CARE_PROVIDER_SITE_OTHER): Payer: No Typology Code available for payment source

## 2014-08-28 ENCOUNTER — Telehealth: Payer: Self-pay | Admitting: Internal Medicine

## 2014-08-28 ENCOUNTER — Other Ambulatory Visit: Payer: Self-pay

## 2014-08-28 ENCOUNTER — Other Ambulatory Visit: Payer: Self-pay | Admitting: Internal Medicine

## 2014-08-28 ENCOUNTER — Ambulatory Visit (INDEPENDENT_AMBULATORY_CARE_PROVIDER_SITE_OTHER): Payer: No Typology Code available for payment source

## 2014-08-28 VITALS — BP 158/110 | HR 95 | Temp 98.3°F

## 2014-08-28 DIAGNOSIS — Z0130 Encounter for examination of blood pressure without abnormal findings: Secondary | ICD-10-CM

## 2014-08-28 DIAGNOSIS — R000 Tachycardia, unspecified: Secondary | ICD-10-CM

## 2014-08-28 DIAGNOSIS — Z136 Encounter for screening for cardiovascular disorders: Secondary | ICD-10-CM

## 2014-08-28 DIAGNOSIS — R778 Other specified abnormalities of plasma proteins: Secondary | ICD-10-CM

## 2014-08-28 DIAGNOSIS — D649 Anemia, unspecified: Secondary | ICD-10-CM

## 2014-08-28 DIAGNOSIS — G894 Chronic pain syndrome: Secondary | ICD-10-CM

## 2014-08-28 LAB — CBC WITH DIFFERENTIAL/PLATELET
BASOS ABS: 0 10*3/uL (ref 0.0–0.1)
BASOS PCT: 0.8 % (ref 0.0–3.0)
EOS ABS: 0 10*3/uL (ref 0.0–0.7)
Eosinophils Relative: 0.8 % (ref 0.0–5.0)
HCT: 36.5 % — ABNORMAL LOW (ref 39.0–52.0)
Hemoglobin: 11.9 g/dL — ABNORMAL LOW (ref 13.0–17.0)
LYMPHS PCT: 35.6 % (ref 12.0–46.0)
Lymphs Abs: 1.4 10*3/uL (ref 0.7–4.0)
MCHC: 32.7 g/dL (ref 30.0–36.0)
MCV: 78 fl (ref 78.0–100.0)
MONO ABS: 0.5 10*3/uL (ref 0.1–1.0)
Monocytes Relative: 13.6 % — ABNORMAL HIGH (ref 3.0–12.0)
NEUTROS PCT: 49.2 % (ref 43.0–77.0)
Neutro Abs: 1.9 10*3/uL (ref 1.4–7.7)
PLATELETS: 276 10*3/uL (ref 150.0–400.0)
RBC: 4.69 Mil/uL (ref 4.22–5.81)
RDW: 16.1 % — ABNORMAL HIGH (ref 11.5–15.5)
WBC: 3.8 10*3/uL — ABNORMAL LOW (ref 4.0–10.5)

## 2014-08-28 LAB — IBC PANEL
Iron: 34 ug/dL — ABNORMAL LOW (ref 42–165)
SATURATION RATIOS: 9 % — AB (ref 20.0–50.0)
TRANSFERRIN: 270 mg/dL (ref 212.0–360.0)

## 2014-08-28 LAB — T3, FREE: T3, Free: 3 pg/mL (ref 2.3–4.2)

## 2014-08-28 LAB — TSH: TSH: 2.09 u[IU]/mL (ref 0.35–4.50)

## 2014-08-28 LAB — T4, FREE: Free T4: 0.62 ng/dL (ref 0.60–1.60)

## 2014-08-28 MED ORDER — METOPROLOL SUCCINATE ER 25 MG PO TB24: 25.0000 mg | ORAL_TABLET | Freq: Two times a day (BID) | ORAL | Status: DC

## 2014-08-28 MED ORDER — TRAMADOL HCL 50 MG PO TABS: 50.0000 mg | ORAL_TABLET | Freq: Three times a day (TID) | ORAL | Status: DC | PRN

## 2014-08-28 NOTE — Telephone Encounter (Signed)
He needs labs completed as entered into orders;he can pik up Rx after going to Lab. He also needs VS repeated

## 2014-08-28 NOTE — Telephone Encounter (Signed)
Patient states that he needs a refill of tramadol. Advised that per the sig on 08/22/2014 script, it shows for 10 days. i have scheduled an appt with greg calone, but due to access, appt is on 09/07/2014 (first available). Please write temporary script to last until 09/07/2014. Advised that it is of paramount importance that the patient be here for 09/07/2014 appt.

## 2014-08-28 NOTE — Telephone Encounter (Signed)
Spoke to patient and advised. He understood

## 2014-08-28 NOTE — Telephone Encounter (Signed)
Advised patient of dr hoppers note in last office visit, patient needs labs redrawn, needs to pick up stool sample card, needs vitals signs taken again before he can pick up rx for tramadol, rx is with Yunior Jain at dr hoppers office

## 2014-08-29 LAB — VITAMIN B12: Vitamin B-12: 684 pg/mL (ref 211–911)

## 2014-08-30 LAB — PROTEIN ELECTROPHORESIS, SERUM, WITH REFLEX
ALBUMIN ELP: 3.5 g/dL — AB (ref 3.8–4.8)
Alpha-1-Globulin: 0.4 g/dL — ABNORMAL HIGH (ref 0.2–0.3)
Alpha-2-Globulin: 1.1 g/dL — ABNORMAL HIGH (ref 0.5–0.9)
BETA 2: 0.6 g/dL — AB (ref 0.2–0.5)
Beta Globulin: 0.5 g/dL (ref 0.4–0.6)
Gamma Globulin: 1.9 g/dL — ABNORMAL HIGH (ref 0.8–1.7)
Total Protein, Serum Electrophoresis: 8 g/dL (ref 6.1–8.1)

## 2014-09-01 ENCOUNTER — Telehealth: Payer: Self-pay

## 2014-09-01 NOTE — Telephone Encounter (Signed)
Lab results mailed.

## 2014-09-07 ENCOUNTER — Ambulatory Visit: Payer: No Typology Code available for payment source | Admitting: Family

## 2014-09-07 DIAGNOSIS — Z0289 Encounter for other administrative examinations: Secondary | ICD-10-CM

## 2014-12-15 ENCOUNTER — Encounter (HOSPITAL_COMMUNITY): Payer: Self-pay | Admitting: Emergency Medicine

## 2014-12-15 ENCOUNTER — Emergency Department (HOSPITAL_COMMUNITY)
Admission: EM | Admit: 2014-12-15 | Discharge: 2014-12-15 | Disposition: A | Discharge status: Home / Self Care | Payer: Self-pay | Attending: Emergency Medicine | Admitting: Emergency Medicine

## 2014-12-15 ENCOUNTER — Emergency Department (HOSPITAL_COMMUNITY): Payer: No Typology Code available for payment source

## 2014-12-15 ENCOUNTER — Emergency Department (HOSPITAL_COMMUNITY): Payer: Self-pay

## 2014-12-15 DIAGNOSIS — R112 Nausea with vomiting, unspecified: Secondary | ICD-10-CM | POA: Insufficient documentation

## 2014-12-15 DIAGNOSIS — G8929 Other chronic pain: Secondary | ICD-10-CM | POA: Insufficient documentation

## 2014-12-15 DIAGNOSIS — R1084 Generalized abdominal pain: Secondary | ICD-10-CM | POA: Insufficient documentation

## 2014-12-15 LAB — COMPREHENSIVE METABOLIC PANEL
ALT: 23 U/L (ref 17–63)
AST: 28 U/L (ref 15–41)
Albumin: 4 g/dL (ref 3.5–5.0)
Alkaline Phosphatase: 78 U/L (ref 38–126)
Anion gap: 5 (ref 5–15)
BILIRUBIN TOTAL: 0.6 mg/dL (ref 0.3–1.2)
BUN: 7 mg/dL (ref 6–20)
CHLORIDE: 102 mmol/L (ref 101–111)
CO2: 25 mmol/L (ref 22–32)
Calcium: 9 mg/dL (ref 8.9–10.3)
Creatinine, Ser: 0.75 mg/dL (ref 0.61–1.24)
Glucose, Bld: 109 mg/dL — ABNORMAL HIGH (ref 65–99)
POTASSIUM: 3.9 mmol/L (ref 3.5–5.1)
Sodium: 132 mmol/L — ABNORMAL LOW (ref 135–145)
TOTAL PROTEIN: 8.9 g/dL — AB (ref 6.5–8.1)

## 2014-12-15 LAB — LACTIC ACID, PLASMA: LACTIC ACID, VENOUS: 1.4 mmol/L (ref 0.5–2.0)

## 2014-12-15 LAB — URINALYSIS, ROUTINE W REFLEX MICROSCOPIC
BILIRUBIN URINE: NEGATIVE
GLUCOSE, UA: NEGATIVE mg/dL
HGB URINE DIPSTICK: NEGATIVE
KETONES UR: NEGATIVE mg/dL
Leukocytes, UA: NEGATIVE
NITRITE: NEGATIVE
PH: 7 (ref 5.0–8.0)
Protein, ur: NEGATIVE mg/dL
Urobilinogen, UA: 0.2 mg/dL (ref 0.0–1.0)

## 2014-12-15 LAB — CBC WITH DIFFERENTIAL/PLATELET
Basophils Absolute: 0 10*3/uL (ref 0.0–0.1)
Basophils Relative: 0 % (ref 0–1)
EOS PCT: 0 % (ref 0–5)
Eosinophils Absolute: 0 10*3/uL (ref 0.0–0.7)
HEMATOCRIT: 39.3 % (ref 39.0–52.0)
Hemoglobin: 13.2 g/dL (ref 13.0–17.0)
LYMPHS PCT: 12 % (ref 12–46)
Lymphs Abs: 1 10*3/uL (ref 0.7–4.0)
MCH: 27.4 pg (ref 26.0–34.0)
MCHC: 33.6 g/dL (ref 30.0–36.0)
MCV: 81.5 fL (ref 78.0–100.0)
MONO ABS: 0.6 10*3/uL (ref 0.1–1.0)
Monocytes Relative: 7 % (ref 3–12)
Neutro Abs: 6.8 10*3/uL (ref 1.7–7.7)
Neutrophils Relative %: 81 % — ABNORMAL HIGH (ref 43–77)
PLATELETS: 277 10*3/uL (ref 150–400)
RBC: 4.82 MIL/uL (ref 4.22–5.81)
RDW: 16.5 % — AB (ref 11.5–15.5)
WBC: 8.5 10*3/uL (ref 4.0–10.5)

## 2014-12-15 LAB — LIPASE, BLOOD: LIPASE: 11 U/L — AB (ref 22–51)

## 2014-12-15 IMAGING — CT CT ABD-PELV W/ CM
2 of 4 series · 16 of 46 positions shown, 18 images · IV contrast (Omnipaque 300)
Comparison: None.

CLINICAL DATA: 34-year-old male with abdominal pain and vomiting
since last night. Initial encounter.

EXAM:
CT ABDOMEN AND PELVIS WITH CONTRAST
TECHNIQUE: Multidetector CT imaging of the abdomen and pelvis was performed
using the standard protocol following bolus administration of
intravenous contrast.
CONTRAST:  25mL OMNIPAQUE IOHEXOL 300 MG/ML SOLN, 100mL OMNIPAQUE
IOHEXOL 300 MG/ML SOLN

[Series 2: abd_pel_with 5.0 b40f · axial · 0.68mm/px · z∈[-384,+31]mm · 13 of 91 slices shown, 15 images]
[im 4/91  soft-tissue]
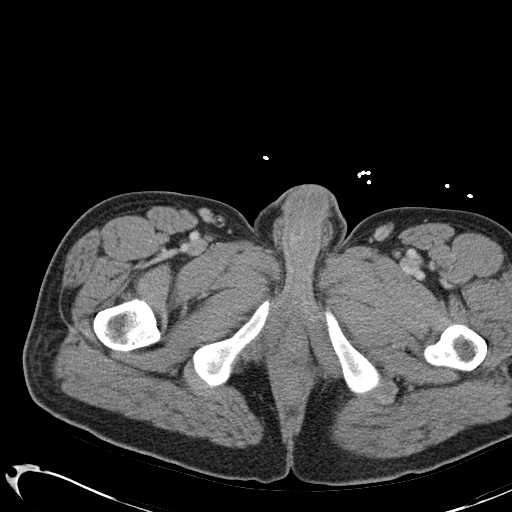
[im 4/91  bone]
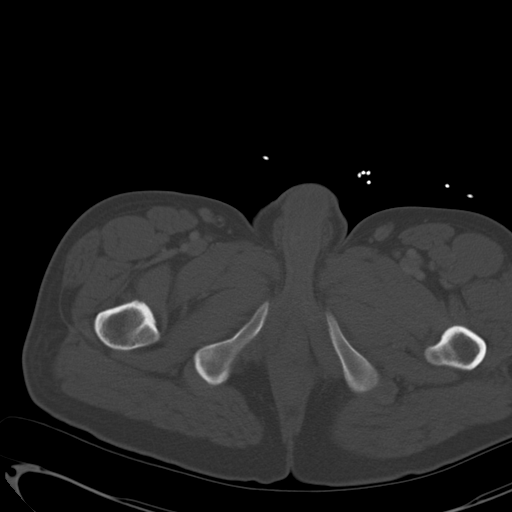
[im 12/91  soft-tissue]
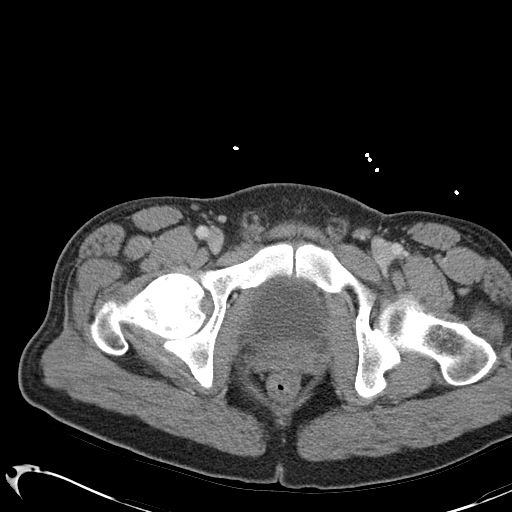
[im 19/91  soft-tissue]
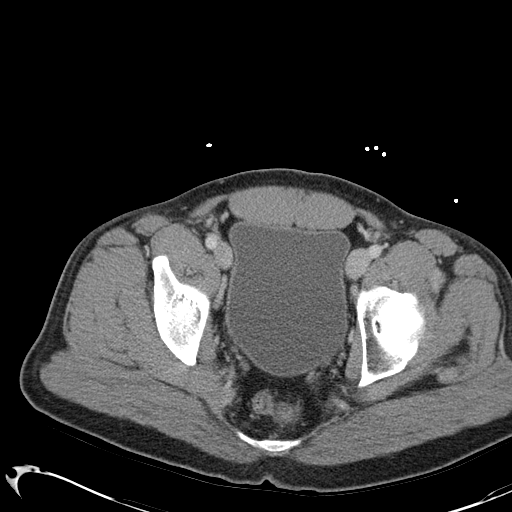
[im 27/91  soft-tissue]
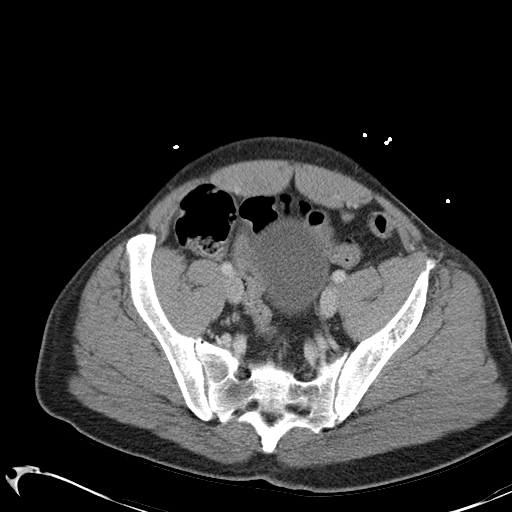
[im 31/91  soft-tissue]
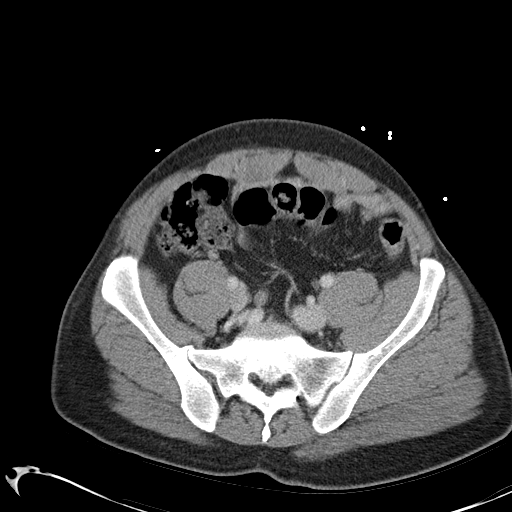
[im 38/91  soft-tissue]
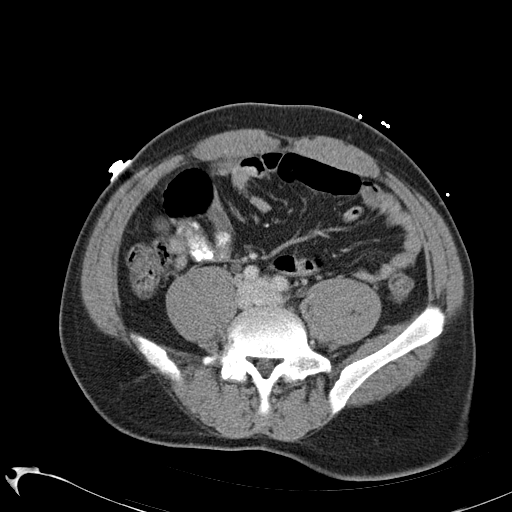
[im 46/91  soft-tissue]
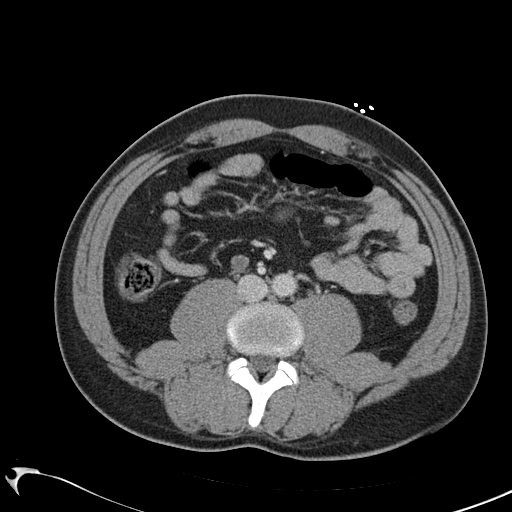
[im 53/91  soft-tissue]
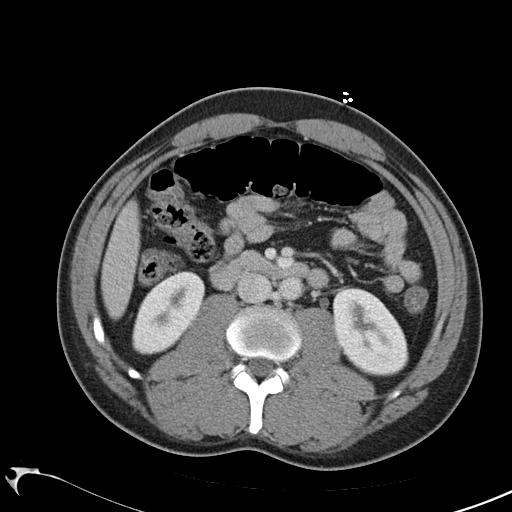
[im 61/91  soft-tissue]
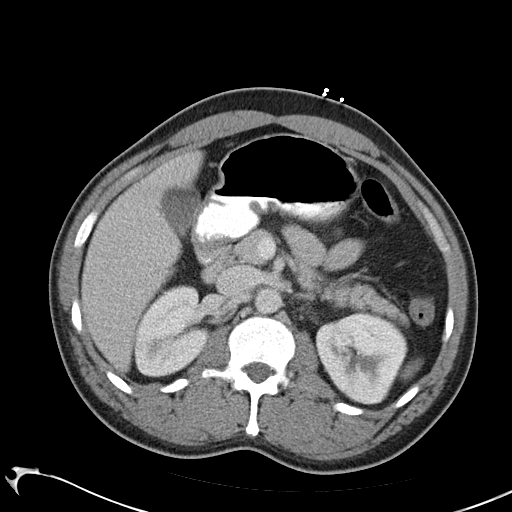
[im 61/91  bone]
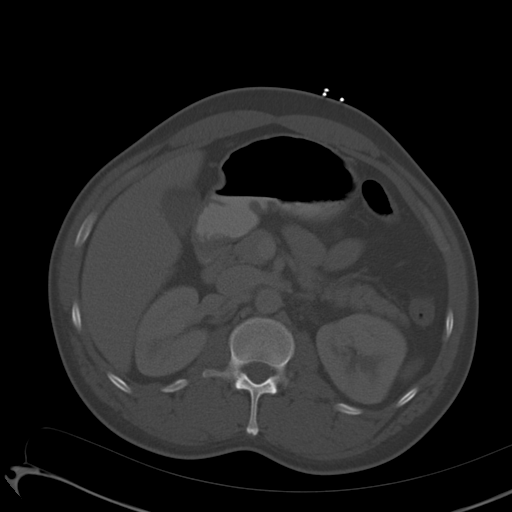
[im 64/91  soft-tissue]
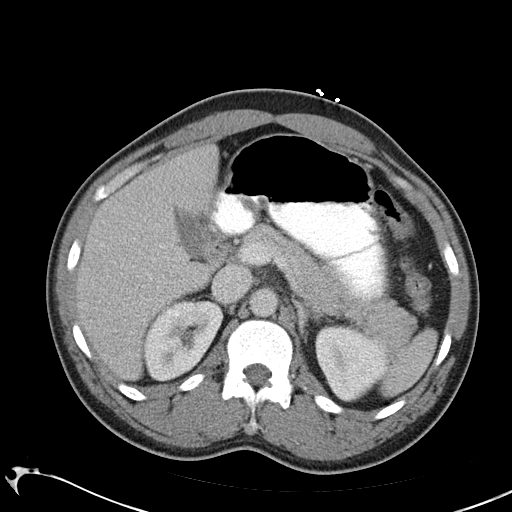
[im 72/91  soft-tissue]
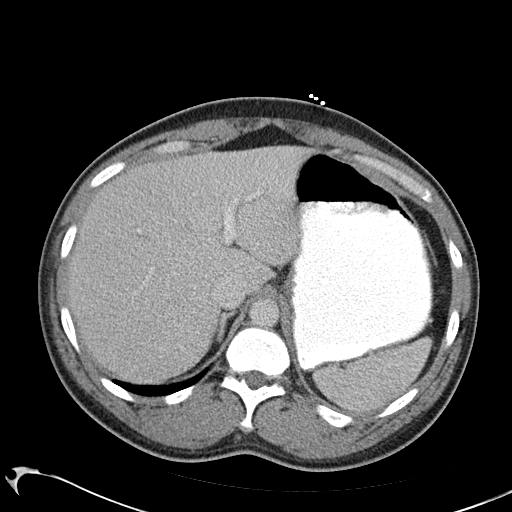
[im 79/91  soft-tissue]
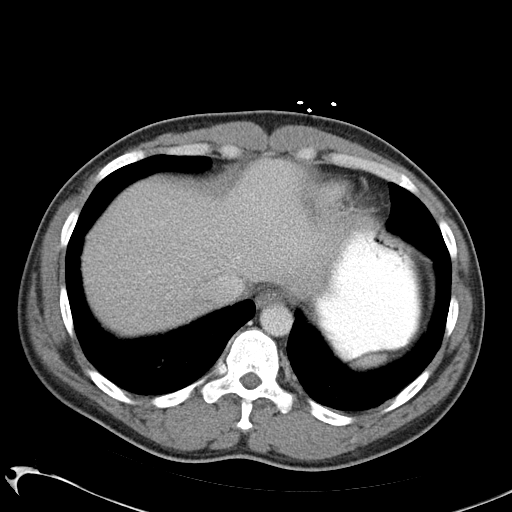
[im 87/91  soft-tissue]
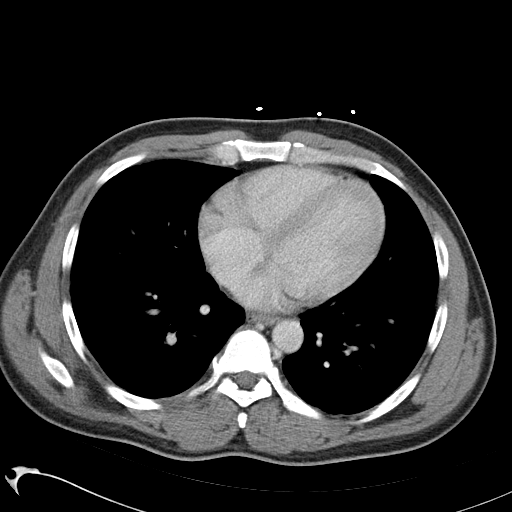

[Series 3: abd_pel_with 3.0 spo cor · coronal · 0.63mm/px · 3 of 93 slices shown]
[im 31/93  soft-tissue]
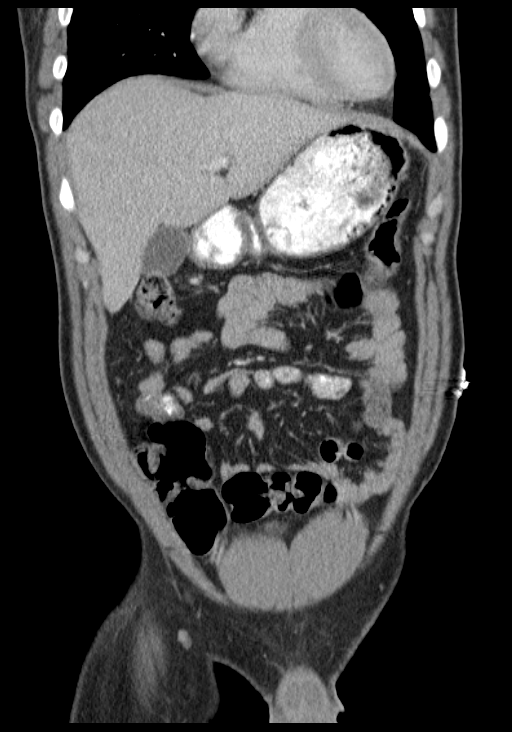
[im 41/93  soft-tissue]
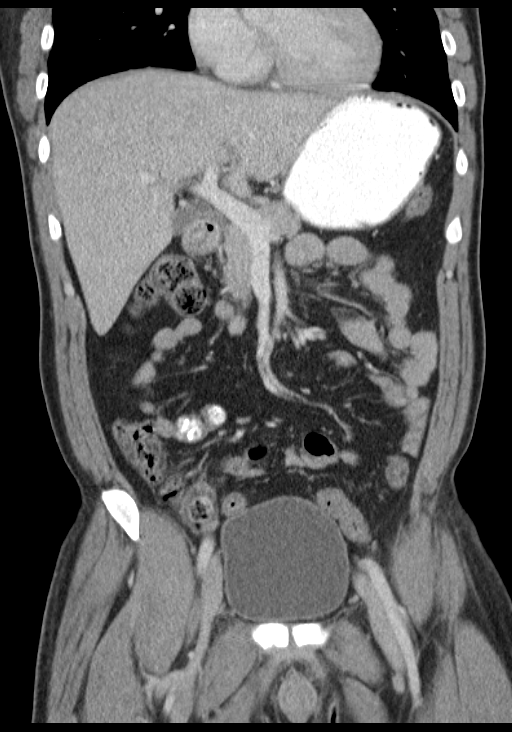
[im 52/93  soft-tissue]
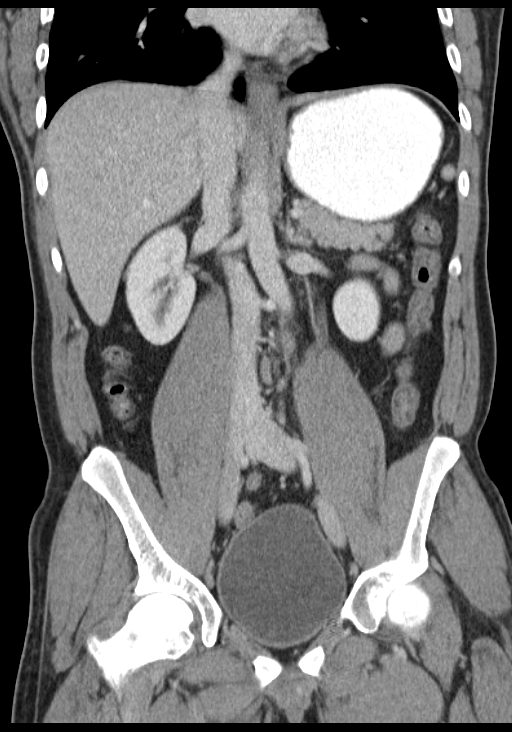

[16 of 46 positions shown; findings below may reference images not displayed]

FINDINGS: Negative lung bases.  No pericardial or pleural effusion.

No acute osseous abnormality identified.

No pelvic free fluid. Decompressed rectum. Unremarkable urinary
bladder. Negative sigmoid colon.

Decompressed left colon. Normal transverse colon, right colon and
appendix (near the midline on series 2, image 58). Negative,
decompressed terminal ileum. No dilated or abnormal small bowel
loops are identified. The stomach is distended with oral contrast.
The duodenum is decompressed and within normal limits.

No pneumoperitoneum or free abdominal fluid. Liver, gallbladder,
spleen, pancreas and adrenal glands are within normal limits. The
portal venous system is patent. Major arterial structures are
patent. No lymphadenopathy. Both kidneys appear normal.
IMPRESSION: Negative CT Abdomen and Pelvis.

## 2014-12-15 MED ORDER — SODIUM CHLORIDE 0.9 % IV SOLN
1000.0000 mL | INTRAVENOUS | Status: DC
  Administered 2014-12-15: 1000 mL via INTRAVENOUS

## 2014-12-15 MED ORDER — MORPHINE SULFATE (PF) 4 MG/ML IV SOLN
4.0000 mg | Freq: Once | INTRAVENOUS | Status: AC
  Administered 2014-12-15: 4 mg via INTRAVENOUS
  Filled 2014-12-15: qty 1

## 2014-12-15 MED ORDER — HYDROMORPHONE HCL 1 MG/ML IJ SOLN
1.0000 mg | Freq: Once | INTRAMUSCULAR | Status: AC
  Administered 2014-12-15: 1 mg via INTRAVENOUS
  Filled 2014-12-15: qty 1

## 2014-12-15 MED ORDER — IOHEXOL 300 MG/ML  SOLN
100.0000 mL | Freq: Once | INTRAMUSCULAR | Status: AC | PRN
  Administered 2014-12-15: 100 mL via INTRAVENOUS

## 2014-12-15 MED ORDER — ONDANSETRON HCL 4 MG PO TABS: 4.0000 mg | ORAL_TABLET | Freq: Three times a day (TID) | ORAL | Status: DC | PRN

## 2014-12-15 MED ORDER — SODIUM CHLORIDE 0.9 % IV SOLN
1000.0000 mL | Freq: Once | INTRAVENOUS | Status: AC
  Administered 2014-12-15: 1000 mL via INTRAVENOUS

## 2014-12-15 MED ORDER — ONDANSETRON HCL 4 MG/2ML IJ SOLN
4.0000 mg | Freq: Once | INTRAMUSCULAR | Status: AC
  Administered 2014-12-15: 4 mg via INTRAVENOUS
  Filled 2014-12-15: qty 2

## 2014-12-15 MED ORDER — IOHEXOL 300 MG/ML  SOLN
25.0000 mL | Freq: Once | INTRAMUSCULAR | Status: AC | PRN
  Administered 2014-12-15: 25 mL via ORAL

## 2014-12-15 MED ORDER — OXYCODONE-ACETAMINOPHEN 5-325 MG PO TABS: 2.0000 | ORAL_TABLET | ORAL | Status: DC | PRN

## 2014-12-15 NOTE — ED Notes (Signed)
MD at bedside. 

## 2014-12-15 NOTE — ED Notes (Signed)
Generalized abd pain with n/v since 0000. lnbm yesterday.

## 2014-12-15 NOTE — Discharge Instructions (Signed)
°Emergency Department Resource Guide °1) Find a Doctor and Pay Out of Pocket °Although you won't have to find out who is covered by your insurance plan, it is a good idea to ask around and get recommendations. You will then need to call the office and see if the doctor you have chosen will accept you as a new patient and what types of options they offer for patients who are self-pay. Some doctors offer discounts or will set up payment plans for their patients who do not have insurance, but you will need to ask so you aren't surprised when you get to your appointment. ° °2) Contact Your Local Health Department °Not all health departments have doctors that can see patients for sick visits, but many do, so it is worth a call to see if yours does. If you don't know where your local health department is, you can check in your phone book. The CDC also has a tool to help you locate your state's health department, and many state websites also have listings of all of their local health departments. ° °3) Find a Walk-in Clinic °If your illness is not likely to be very severe or complicated, you may want to try a walk in clinic. These are popping up all over the country in pharmacies, drugstores, and shopping centers. They're usually staffed by nurse practitioners or physician assistants that have been trained to treat common illnesses and complaints. They're usually fairly quick and inexpensive. However, if you have serious medical issues or chronic medical problems, these are probably not your best option. ° °No Primary Care Doctor: °- Call [facility] at  832-8000 - they can help you locate a primary care doctor that  accepts your insurance, provides certain services, etc. °- Physician Referral Service- 1-800-533-3463 ° °Chronic Pain Problems: °Organization         Address  Phone   Notes  °[facility]  (336) 297-2271 Patients need to be referred by their primary care doctor.  ° °Medication  Assistance: °Organization         Address  Phone   Notes  °[place] [facility] [address] °[place], [place] [address] (336) 641-8030 --Must be a resident of [place] °-- Must have NO insurance coverage whatsoever (no Medicaid/ Medicare, etc.) °-- The pt. MUST have a primary care doctor that directs their care regularly and follows them in the community °  °[facility]  (866) 331-1348   °[employer]  (888) 892-1162   ° °Agencies that provide inexpensive medical care: °Organization         Address  Phone   Notes  °[facility]  (336) 832-8035   °[facility]    (336) 832-7272   °[facility] [address] °[place], [place] [address] (336) 832-4777   °[facility] [address], °[place] (336) 271-4999   °[facility]    (336) 373-0678   °[facility]    (336) 272-1050   °[facility] ° [address], [place] Phone:  (336) 832-4444, Fax:  (336) 832-4440 Hours of Operation:  9 am - 6 pm, M-F.  Also accepts Medicaid/Medicare and self-pay.  °[facility] ° [address], [place] Phone: (336) 832-3150, Fax: (336) [phone]. Hours of Operation:  8:30 am - 5:30 pm, M-F.  Also accepts Medicaid and self-pay.  °[facility] [place] [address], [place] Phone: (336) 878-6027   °[facility] [address], [place], [place] (336)723-1848, Ext. 123 Mondays & Thursdays: 7-9 AM.  First 15 patients are seen on a first come, first serve basis. °  ° °Medicaid-accepting [place] Providers: ° °Organization         Address  Phone   Notes  °[facility] [address], [place] (336) 641-2100 Also accepts self-pay patients.  °[facility] [address], [place] ° (336) 856-9996   °[facility] [address], [place]  (336) 288-8857   °[facility] [address] [place] [address], [place] (336) 299-7000   °Veita Bland [address], [place]  ° (336) 373-1557 Only accepts [place] Access Medicaid patients after they have their name applied to their card.  ° °Self-Pay (no insurance) in [place]: ° °Organization         Address  Phone   Notes  °Sickle Cell Patients, [facility] [address], [place] (336) 832-1970   °[facility] [address], [place] (336) 832-4400   °[facility] ° [address], [place] (336) 992-4800   °[facility]/Dr. Osei-Bonsu ° [address] [place] [address], [place] or [address], [place] (336) [phone] Phone number for both [place] and [place] locations is the same.  °[facility] [address], [place] (336) 299-0000   °[facility] [address] [place] [address], [place] or [address] (336) 852-7530 °(336) 878-2260   °[facility] [address], [place] (336) 350-1642, phone; (336) 294-5005, fax Sees patients 1st and 3rd Saturday of every month.  Must not qualify for public or private insurance (i.e. Medicaid, Medicare, [place] Health Choice, Veterans' Benefits) • Household income should be no more than 200% of the poverty level •The clinic cannot treat you if you are pregnant or think you are pregnant • Sexually transmitted diseases are not treated at the clinic.  ° ° °Dental Care: °Organization         Address  Phone  Notes  °[place] [facility] [address], [place] (336) 641-6152 Accepts children up to age 21 who are enrolled in Medicaid or [place] Health Choice; pregnant women with a Medicaid card; and children who have applied for Medicaid or [place] Health Choice, but were declined, whose parents can pay a reduced fee at time of service.  °[place]  [facility] [place]  [address], [place] (336) 641-7733 Accepts children up to age 21 who are enrolled in Medicaid or [place] Health Choice; pregnant women with a Medicaid card; and children who have applied for Medicaid or [place] Health Choice, but were declined, whose parents can pay a reduced fee at time of service.  °[facility] ° [address], [place] (336) 641-4533 Patients are seen by appointment only. Walk-ins are not accepted. [facility] will see patients 18 years of age and older. °Monday - Tuesday (8am-5pm) °Most Wednesdays (8:30-5pm) °$30 per visit, cash only  °[facility] ° [address], [place] (336) 641-4533 Patients are seen by appointment only. Walk-ins are not accepted. [facility] will see patients 18 years of age and older. °One   Wednesday Evening (Monthly: Volunteer Based).  $30 per visit, cash only  °[facility]  (919) 537-3737 for adults; Children under age 4, call Graduate Pediatric Dentistry at (919) [phone]. Children aged 4-14, please call (919) [phone] to request a pediatric application. ° Dental services are provided in all areas of dental care including fillings, crowns and bridges, complete and partial dentures, implants, gum treatment, root canals, and extractions. Preventive care is also provided. Treatment is provided to both adults and children. °Patients are selected via a lottery and there is often a waiting list. °  °[facility] [address], °[place] ° (336) 763-8833 [email or web address] °  °[facility] [address], [place], [place] (336)723-1848, Ext. 123 Second and Fourth Thursday of each month, opens at 6:30 AM; Clinic ends at 9 AM.  Patients are seen on a first-come first-served basis, and a limited number are seen during each clinic.  ° °[facility] ° [address], [place], [place] (336) 723-7904    Eligibility Requirements °You must have lived in [place], [place], or [place] counties for at least the last three months. °  You cannot be eligible for state or federal sponsored healthcare insurance, including Veterans Administration, Medicaid, or Medicare. °  You generally cannot be eligible for healthcare insurance through your employer.  °  How to apply: °Eligibility screenings are held every Tuesday and Wednesday afternoon from 1:00 pm until 4:00 pm. You do not need an appointment for the interview!  °[facility] [address], [place], [place] 336-631-2330   °[facility]  336-342-8273   °[facility]  336-703-3100   °[facility]  336-570-6415   ° °Behavioral Health Resources in the Community: °Intensive Outpatient Programs °Organization         Address  Phone  Notes  °[facility] [address], [place], [place] 336-878-6098   °[facility] [address], [place], [place] 336-832-9800   °[facility] [address], [place], [place] ° 336-882-2125   °[facility] [address],  °[place], [place] 1-800-853-5163 or 336-641-4981   °Substance Abuse Resources °Organization         Address  Phone  Notes  °[facility]  336-882-2125   °[facility]  336-784-9470   °[facility]  336-285-9073   °[facility]  336-845-3988   °Residential & Outpatient Substance Abuse Program  1-800-659-3381   °Psychological Services °Organization         Address  Phone  Notes  °[facility]  336- 832-9600   °[facility]  336- 378-7881   °[facility] [address], [place] 1-800-853-5163 or 336-641-4981   ° °Mobile Crisis Teams °Organization         Address  Phone  Notes  °[facility], Mobile Crisis Care Unit  1-877-626-1772   °Assertive °[facility] ° [address]  [place], [place] 336-834-9664   °Sharon DeEsch [address] °[place] 336-554-5454   ° °Self-Help/Support Groups °Organization         Address  Phone             Notes  °[facility] - variety of support groups  336- 373-1402 Call for more information  °Narcotics Anonymous (NA), [facility] [address], °[place]  2 meetings at this location  ° °  Residential Treatment Programs °Organization         Address  Phone  Notes  °[facility] [address],    °[place]  1-866-801-8205   °[facility] ° [address], [place], [place] 704-293-8524   °[facility] [address], [place] 336-845-3988 Admissions: 8am-3pm M-F  °[facility] [address]    °[place], [place] 336-841-1104   °[facility] [address], [place], [place] 336-379-7146   °[facility] [address]  °[place], [place] 336-285-9073   °[facility] - Intensive Outpatient [address], [place], [place] 336-852-3033   °[facility] [address]  °[place], [place] 1-877-615-2722 or 336-784-9470   °[facility] [address] [place], [place] 336-227-7417 Accepts Medicaid  °[facility] [address]  °[place] 1-800-659-3381 Substance Abuse/Addiction Treatment  ° °[place] Behavioral Health Resources °Organization         Address  Phone  Notes  °[facility]  (888) 581-9988   °Julie Brannon, PhD [address] [place], [place]   (336) 349-5553 or (336) 951-0000   °[facility]   [address] °[place], [place] (336) 349-4454   °[facility] [address], [place], [place] (336) 342-8316 Insurance/Medicaid/sponsorship through [facility]  °[facility] [address]                                    [place], [place] (336) 342-8316 Therapy/tele-psych/case    °[facility] [address]  ° [place], [place] (336) 349-2233    °Dr. Arfeen  (336) 349-4544   °[facility] [place]  [employer] [place] [facility] 1) [address],  °2) [address], [place] °3)  [address], [place] (336) 349-3220 °(336) 342-7768 ° °(336) 342-8140   °[place] [facility] (336) 342-1394 or (336) 342-3537 (After Hours)    ° ° °

## 2014-12-15 NOTE — ED Provider Notes (Signed)
CSN: [id number]     Arrival date & time 12/15/14  1028 History  This chart was scribed for No att. providers found by Marica Otter, ED Scribe. This patient was seen in room APA15/APA15 and the patient's care was started at 11:11 AM.   Chief Complaint  Patient presents with  . Abdominal Pain   The history is provided by the patient. No language interpreter was used.   PCP: No PCP Per Patient HPI Comments: David Davies is a 34 y.o. male who presents to the Emergency Department complaining of atraumatic, sudden onset, 9/10 abd pain with associated n/v (4 episodes of vomiting this morning) onset last night.   Pt denies constipation, diarrhea, recent travels, fever, chills,EtOH use, drug use, testicular pain, or any other Sx at this time. Pt denies and medicinal allergies.   Past Medical History  Diagnosis Date  . Chronic leg pain     left  . Left leg paresthesias   . Chronic pain   . Chronic back pain    History reviewed. No pertinent past surgical history. History reviewed. No pertinent family history. Social History  Substance Use Topics  . Smoking status: Never Smoker   . Smokeless tobacco: None  . Alcohol Use: No    Review of Systems  Constitutional: Negative for fever and chills.  Gastrointestinal: Positive for nausea, vomiting and abdominal pain. Negative for diarrhea and constipation.  Genitourinary: Negative for testicular pain.  All other systems reviewed and are negative.  Allergies  Review of patient's allergies indicates no known allergies.  Home Medications   Prior to Admission medications   Medication Sig Start Date End Date Taking? Authorizing Provider  bismuth subsalicylate (PEPTO BISMOL) 262 MG/15ML suspension Take 30 mLs by mouth every 6 (six) hours as needed (stomach pain).    Yes Historical Provider, MD  ondansetron (ZOFRAN) 4 MG tablet Take 1 tablet (4 mg total) by mouth every 8 (eight) hours as needed for nausea or vomiting. 12/15/14   Marily Memos, MD   oxyCODONE-acetaminophen (PERCOCET) 5-325 MG per tablet Take 2 tablets by mouth every 4 (four) hours as needed. 12/15/14   Marily Memos, MD   Triage Vitals: BP 147/115 mmHg  Pulse 68  Temp(Src) 98.2 F (36.8 C)  Resp 18  Ht  (1.651 m)  Wt 150 lb (68.04 kg)  BMI 24.96 kg/m2  SpO2 100% Physical Exam  Constitutional: He is oriented to person, place, and time. He appears well-developed and well-nourished.  HENT:  Head: Normocephalic and atraumatic.  Eyes: EOM are normal.  Neck: Normal range of motion.  Cardiovascular: Normal rate, regular rhythm, normal heart sounds and intact distal pulses.   Pulmonary/Chest: Effort normal and breath sounds normal. No respiratory distress.  Abdominal: Soft. He exhibits no distension. There is generalized tenderness (worse epigastric and RUQ). There is guarding. There is no rigidity and no rebound.  Musculoskeletal: Normal range of motion.  Neurological: He is alert and oriented to person, place, and time.  Skin: Skin is warm and dry.  Psychiatric: He has a normal mood and affect. Judgment normal.  Nursing note and vitals reviewed.  ED Course  Procedures (including critical care time) DIAGNOSTIC STUDIES: Oxygen Saturation is 100% on RA, nl by my interpretation.    COORDINATION OF CARE: 11:15 AM: Discussed treatment plan which includes imaging with pt at bedside; patient verbalizes understanding and agrees with treatment plan.  Labs Review Labs Reviewed  CBC WITH DIFFERENTIAL/PLATELET - Abnormal; Notable for the following:    RDW 16.5 (*)  Neutrophils Relative % 81 (*)    All other components within normal limits  COMPREHENSIVE METABOLIC PANEL - Abnormal; Notable for the following:    Sodium 132 (*)    Glucose, Bld 109 (*)    Total Protein 8.9 (*)    All other components within normal limits  LIPASE, BLOOD - Abnormal; Notable for the following:    Lipase 11 (*)    All other components within normal limits  URINALYSIS, ROUTINE W REFLEX  MICROSCOPIC (NOT AT [facility]) - Abnormal; Notable for the following:    Specific Gravity, Urine <1.005 (*)    All other components within normal limits  LACTIC ACID, PLASMA    Imaging Review Ct Abdomen Pelvis W Contrast  12/15/2014   CLINICAL DATA:  34 year old male with abdominal pain and vomiting since last night. Initial encounter.  EXAM: CT ABDOMEN AND PELVIS WITH CONTRAST  TECHNIQUE: Multidetector CT imaging of the abdomen and pelvis was performed using the standard protocol following bolus administration of intravenous contrast.  CONTRAST:  25mL OMNIPAQUE IOHEXOL 300 MG/ML SOLN, OMNIPAQUE IOHEXOL 300 MG/ML SOLN  COMPARISON:  None.  FINDINGS: Negative lung bases.  No pericardial or pleural effusion.  No acute osseous abnormality identified.  No pelvic free fluid. Decompressed rectum. Unremarkable urinary bladder. Negative sigmoid colon.  Decompressed left colon. Normal transverse colon, right colon and appendix (near the midline on series 2, image 58). Negative, decompressed terminal ileum. No dilated or abnormal small bowel loops are identified. The stomach is distended with oral contrast. The duodenum is decompressed and within normal limits.  No pneumoperitoneum or free abdominal fluid. Liver, gallbladder, spleen, pancreas and adrenal glands are within normal limits. The portal venous system is patent. Major arterial structures are patent. No lymphadenopathy. Both kidneys appear normal.  IMPRESSION: Negative CT Abdomen and Pelvis.   Electronically Signed   By: Odessa Fleming M.D.   On: 12/15/2014 12:40   US Abdomen Limited  12/15/2014   CLINICAL DATA:  Five day history of abdominal pain  EXAM: US ABDOMEN LIMITED - RIGHT UPPER QUADRANT  COMPARISON:  None.  FINDINGS: Gallbladder:  No gallstones or wall thickening visualized. There is no pericholecystic fluid. No sonographic Murphy sign noted.  Common bile duct:  Diameter: 3 mm. No intrahepatic or extrahepatic biliary duct dilatation.  Liver:  No focal  lesion identified. Within normal limits in parenchymal echogenicity.  IMPRESSION: Study within normal limits.   Electronically Signed   By: [name] III M.D.   On: 12/15/2014 14:38   I have personally reviewed and evaluated these images and lab results as part of my medical decision-making.   EKG Interpretation None      MDM   Final diagnoses:  Generalized abdominal pain   34 year old male with generalized abdominal pain worse in epigastric right upper quadrant area. 3-4 episodes of vomiting since this morning. Exam as above with some guarding but no other evidence of peritonitis. Initial differential includes appendicitis versus cholecystitis versus pancreatitis versus gastroenteritis. Secondary to the large differential start with CT scanning this is negative patient will likely need an ultrasound as well. We'll get some pain medicines, make nothing by mouth, fluids and nausea medicine as well. We'll reevaluate closely to ensure isn't develop any worsening symptoms.  CT negative for any acute pathology. unsure of the cause of patient's symptoms discuss with him and return here until the 24 hours if still having abdominal pain. prescription for zofran also given. abdominal exam similar to previous with slightly less tenderness  prior to discharge. still doubt surgical abdomen at this time however discuss with him i wasn't sure that diagnosis anything or to change needed to come back immediately.  I have personally and contemperaneously reviewed labs and imaging and used in my decision making as above.   A medical screening exam was performed and I feel the patient has had an appropriate workup for their chief complaint at this time and likelihood of emergent condition existing is low. They have been counseled on decision, discharge, follow up and which symptoms necessitate immediate return to the emergency department. They or their family verbally stated understanding and agreement with  plan and discharged in stable condition.   I personally performed the services described in this documentation, which was scribed in my presence. The recorded information has been reviewed and is accurate.     Marily Memos, MD 12/15/14 [phone]

## 2014-12-15 NOTE — ED Notes (Signed)
Patient given discharge instruction, verbalized understand. IV removed, band aid applied. Patient ambulatory out of the department.  

## 2014-12-15 NOTE — ED Notes (Signed)
Pt drinking ginger ale  

## 2015-07-02 ENCOUNTER — Emergency Department: Admit: 2015-07-02 | Payer: Self-pay | Primary: Family

## 2015-07-02 ENCOUNTER — Inpatient Hospital Stay
Admit: 2015-07-02 | Discharge: 2015-07-03 | Disposition: A | Discharge status: Home / Self Care | Payer: Self-pay | Attending: Emergency Medicine

## 2015-07-02 DIAGNOSIS — G8918 Other acute postprocedural pain: Secondary | ICD-10-CM

## 2015-07-02 LAB — METABOLIC PANEL, COMPREHENSIVE
A-G Ratio: 0.7 — ABNORMAL LOW (ref 0.8–1.7)
ALT (SGPT): 45 U/L (ref 16–61)
AST (SGOT): 19 U/L (ref 15–37)
Albumin: 3.4 g/dL (ref 3.4–5.0)
Alk. phosphatase: 81 U/L (ref 45–117)
Anion gap: 8 mmol/L (ref 3.0–18)
BUN/Creatinine ratio: 18 (ref 12–20)
BUN: 14 MG/DL (ref 7.0–18)
Bilirubin, total: 0.5 MG/DL (ref 0.2–1.0)
CO2: 27 mmol/L (ref 21–32)
Calcium: 9.1 MG/DL (ref 8.5–10.1)
Chloride: 103 mmol/L (ref 100–108)
Creatinine: 0.8 MG/DL (ref 0.6–1.3)
GFR est AA: >60 mL/min/{1.73_m2} (ref >60)
GFR est non-AA: >60 mL/min/{1.73_m2} (ref >60)
Globulin: 5.2 g/dL — ABNORMAL HIGH (ref 2.0–4.0)
Glucose: 85 mg/dL (ref 74–99)
Potassium: 4 mmol/L (ref 3.5–5.5)
Protein, total: 8.6 g/dL — ABNORMAL HIGH (ref 6.4–8.2)
Sodium: 138 mmol/L (ref 136–145)

## 2015-07-02 LAB — CBC WITH AUTOMATED DIFF
ABS. BASOPHILS: 0 10*3/uL (ref 0.0–0.06)
ABS. EOSINOPHILS: 0 10*3/uL (ref 0.0–0.4)
ABS. LYMPHOCYTES: 1.8 10*3/uL (ref 0.9–3.6)
ABS. MONOCYTES: 0.5 10*3/uL (ref 0.05–1.2)
ABS. NEUTROPHILS: 4.9 10*3/uL (ref 1.8–8.0)
BASOPHILS: 0 % (ref 0–2)
EOSINOPHILS: 1 % (ref 0–5)
HCT: 37.4 % (ref 36.0–48.0)
HGB: 12.6 g/dL — ABNORMAL LOW (ref 13.0–16.0)
LYMPHOCYTES: 24 % (ref 21–52)
MCH: 27.8 PG (ref 24.0–34.0)
MCHC: 33.7 g/dL (ref 31.0–37.0)
MCV: 82.6 FL (ref 74.0–97.0)
MONOCYTES: 7 % (ref 3–10)
MPV: 11.1 FL (ref 9.2–11.8)
NEUTROPHILS: 68 % (ref 40–73)
PLATELET: 288 10*3/uL (ref 135–420)
RBC: 4.53 M/uL — ABNORMAL LOW (ref 4.70–5.50)
RDW: 14.5 % (ref 11.6–14.5)
WBC: 7.3 10*3/uL (ref 4.6–13.2)

## 2015-07-02 LAB — SED RATE (ESR): Sed rate, automated: 27 mm/hr — ABNORMAL HIGH (ref 0–15)

## 2015-07-02 MED ORDER — HYDROCODONE-ACETAMINOPHEN 5 MG-325 MG TAB
5-325 mg | ORAL | Status: AC
Start: 2015-07-02 — End: 2015-07-02
  Administered 2015-07-02: 22:00:00 via ORAL

## 2015-07-02 MED ORDER — HYDROCODONE-ACETAMINOPHEN 5 MG-325 MG TAB
5-325 mg | ORAL_TABLET | ORAL | 0 refills | Status: DC | PRN
Start: 2015-07-02 — End: 2015-11-06

## 2015-07-02 MED FILL — HYDROCODONE-ACETAMINOPHEN 5 MG-325 MG TAB: 5-325 mg | ORAL | Qty: 1

## 2015-07-02 NOTE — ED Notes (Signed)
Report to KC, RN

## 2015-07-02 NOTE — ED Notes (Signed)
I have reviewed discharge instructions with the patient.  The patient verbalized understanding.    Patient armband removed and shredded

## 2015-07-02 NOTE — ED Triage Notes (Signed)
Patient [place] the second digit [facility] [place] [facility] Dr Jonnie FinnerNaylor. Patient states he is still in pain and has had drainage, "sometimes clear, sometimes pus"

## 2015-07-02 NOTE — ED Provider Notes (Signed)
HPI Comments: 5:30 PM William Griffin is a 35 y.o. male with a history of asthma, sciatica, and chronic pain who presents to the ED for evaluation of L foot pain x 2 days. 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 He states that he has been taking an antibiotic called "diloxin or something like that," and plavix since he had the procedure. He states that he has not missed any doses. He notes that the procedure took place in [place]. There are no other concerns at this time.      The history is provided by the patient.        Past Medical History:   Diagnosis Date   ??? Asthma    ??? Chronic pain    ??? Sciatica        Past Surgical History:   Procedure Laterality Date   ??? HX ORTHOPAEDIC           Family History:   Problem Relation Age of Onset   ??? Hypertension Mother    ??? Hypertension Father    ??? Asthma Sister        Social History     Social History   ??? Marital status: SINGLE     Spouse name: N/A   ??? Number of children: N/A   ??? 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 Smoking status: Former Smoker     Packs/day: 0.50     Types: Cigarettes   ??? Smokeless tobacco: Never Used   ??? Alcohol use No      Comment: occasional "pt stated that he stop drinking"   ??? Drug use: No   ??? Sexual activity: Not on file     Other Topics Concern   ??? Not on file     Social History Narrative         ALLERGIES: Review of patient's allergies indicates no known allergies.    Review of Systems   Constitutional: Negative for chills, fatigue and fever.   HENT: Negative for congestion, rhinorrhea and sore throat.    Respiratory: Negative for cough and shortness of breath.    Cardiovascular: Negative for chest pain and palpitations.   Gastrointestinal: Negative for abdominal pain, diarrhea, nausea and vomiting.    Genitourinary: Negative for dysuria, hematuria and urgency.   Musculoskeletal: Positive for arthralgias (L foot pain). Negative for myalgias.   Skin: Positive for wound (surgical wound L foot). Negative for rash.   Neurological: Negative for dizziness and headaches.   All other systems reviewed and are negative.      Vitals:    07/02/15 1634   BP: 111/86   Pulse: 83   Resp: 16   Temp: 98.7 ??F (37.1 ??C)   SpO2: 97%   Weight: 68 kg (150 lb)   Height: 5\' 4"  (1.626 m)            Physical Exam   Constitutional:   General:  Well-developed, well-nourished, well appearing nontoxic nondiaphoretic  Head:  Normocephalic atraumatic    Eyes:  Pupils midrange extraocular movements grossly intact.    Nose:  No rhinorrhea, inspection grossly normal    Ears:  Grossly normal to inspection  Mouth:  Mucous membranes moist    Neck:  Trachea midline, no asymmetry  Chest:  Grossly normal inspection, symmetric chest rise, respirations nonlabored  Extremities:  Grossly normal to inspection  LEFT 2nd toe surgically missing, no discharge no marked pain on palpation, no erythema.  Faint dorsalis pedis pulse.  Neurologic:  Alert and oriented no appreciable focal neurologic deficit  Psychiatric:  Grossly normal mood and affect  Nursing note reviewed, vital signs reviewed.          MDM  Number of Diagnoses or Management Options  Postoperative pain:   Diagnosis management comments: ED course:  Patient presents with LEFT foot pain status post LEFT toe removal.  He is afebrile and not tachycardic saturation normal on room air.  No clinical signs of infection and is currently on doxycycline    Urged him to obtain a follow-up locally.  He was given Norco for his postoperative pain    CBC not elevated white count    No indication to change antibiotic at this time, we'll give him pain medication for home and was urged to follow up with primary care    Patient's presentation, history, physical exam and laboratory evaluations  were reviewed.  At this time patient was felt to be stable for outpatient management and follow with primary care/specialist.  Patient was instructed to return to the emergency department with any concerns.    Disposition:    Discharged home      Portions of this chart were created with Dragon medical speech to text program.   Unrecognized errors may be present.      ED Course       Procedures      Scribe Attestation:   Sallye Lat acting as a scribe for and in the presence of Dr. Despina Hick, MD July 02, 2015 at 5:30 PM       Signed by: Sallye Lat, Scribe, July 02, 2015 at 5:30 PM       Provider Attestation:   I personally performed the services described in the documentation, reviewed the documentation, as recorded by the scribe in my presence, and it accurately and completely records my words and actions.     Reviewed and signed by:  Dr. Despina Hick, MD

## 2015-07-02 NOTE — ED Notes (Signed)
I performed a brief evaluation, including history and physical, of the patient here in triage and I have determined that pt will need further treatment and evaluation from the main side ER physician.  I have placed initial orders to help in expediting patients care.     July 02, 2015 at 4:38 PM - Leafy KindleJaime S Ayerim Berquist, MD        Visit Vitals   ??? BP 111/86 (BP 1 Location: Right arm, BP Patient Position: At rest;Sitting)   ??? Pulse 83   ??? Temp 98.7 ??F (37.1 ??C)   ??? Resp 16   ??? Ht 5\' 4"  (1.626 m)   ??? Wt 68 kg (150 lb)   ??? SpO2 97%   ??? BMI 25.75 kg/m2        35 yo with recent toe amputation in [place] presents with worsening foot pain and drainage.  Stitches still in place.

## 2015-08-20 ENCOUNTER — Ambulatory Visit: Admit: 2015-08-20 | Discharge: 2015-08-20 | Attending: Family Medicine | Primary: Family

## 2015-08-20 ENCOUNTER — Encounter: Attending: Family Medicine | Primary: Family

## 2015-08-20 DIAGNOSIS — I739 Peripheral vascular disease, unspecified: Secondary | ICD-10-CM

## 2015-08-20 MED ORDER — MELOXICAM 15 MG TAB
15 mg | ORAL_TABLET | Freq: Every day | ORAL | 2 refills | Status: DC | PRN
Start: 2015-08-20 — End: 2015-11-06

## 2015-08-20 MED ORDER — DESIPRAMINE 150 MG TAB
150 mg | ORAL_TABLET | Freq: Every evening | ORAL | 1 refills | Status: DC
Start: 2015-08-20 — End: 2015-11-06

## 2015-08-20 MED ORDER — KETOROLAC TROMETHAMINE 60 MG/2 ML IM
60 mg/2 mL | Freq: Once | INTRAMUSCULAR | 0 refills | Status: AC
Start: 2015-08-20 — End: 2015-08-20

## 2015-08-20 NOTE — Progress Notes (Signed)
After obtaining consent, and per orders of Dr. Olen PelZamani, injection of 60mg  Toradol given by Corrin ParkerJennifer L Codylee Patil, LPN. Patient instructed to remain in clinic for 20 minutes afterwards, and to report any adverse reaction to me immediately.  Pt tolerated well no adverse reactions reported or noted.

## 2015-08-20 NOTE — Progress Notes (Signed)
William Griffin presents today for   Chief Complaint   Patient presents with   ??? New Patient   ??? Leg Pain     started 2 years ago off and on       Shanta L Game preferred language for health care discussion is english/other.    Is someone accompanying this pt? no    Is the patient using any DME equipment during OV? no    Depression Screening:  PHQ over the last two weeks 08/20/2015 05/04/2014   Little interest or pleasure in doing things Several days Several days   Feeling down, depressed or hopeless Several days Several days   Total Score PHQ 2 2 2        Learning Assessment:  Learning Assessment 05/04/2014   PRIMARY LEARNER Patient   PRIMARY LANGUAGE ENGLISH   LEARNER PREFERENCE PRIMARY READING     DEMONSTRATION   ANSWERED BY patient   RELATIONSHIP SELF       Abuse Screening:  No flowsheet data found.    Fall Risk  No flowsheet data found.    Health Maintenance reviewed and discussed per provider. Yes    William Griffin is due for tdap.  Please order/place referral if appropriate.      Advance Directive:  1. Do you have an advance directive in place? Patient Reply: no    2. If not, would you like material regarding how to put one in place? Patient Reply: no    Coordination of Care:  1. Have you been to the ER, urgent care clinic since your last visit?  Hospitalized since your last visit? Yes, ED 07/02/15    2. Have you seen or consulted any other health care providers outside of the [facility] since your last visit? Include any pap smears or colon screening. no

## 2015-08-20 NOTE — Progress Notes (Signed)
FAMILY MEDICINE CLINIC NOTE    S: The patient presents for establishment of care.     History of recent left second toe amputation secondary to peripheral vascular disease. This happened at the end of February 2017. His procedure performed at [facility] [place] [facility] [place]. Recently moved [place] the [facility] [place] area. He has been lost to follow up since his amputation and still has sutures in place. Pain described as sharp in the left foot due to swelling.     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 He has taken oxycodone, gabapentin 300 TID, tramadol and plavix. Tramadol and gabapentin has not helped much with his analgesia.     He has not had a vascular consultation, denies erectile dysfunction.     BP elevated today but the patient states that he is in significant pain.     No history of abnormal bleeding or easy bruising, no chest pain, dyspnea, polyuria, polydipsia, edema or palpitations.         Current Outpatient Prescriptions on File Prior to Visit   Medication Sig Dispense Refill   ??? clopidogrel (PLAVIX) 75 mg tab Take 75 mg by mouth.     ??? doxycycline (ADOXA) 100 mg tablet Take 100 mg by mouth two (2) times a day.     ??? HYDROcodone-acetaminophen (NORCO) 5-325 mg per tablet Take 1 Tab by mouth every four (4) hours as needed for Pain. Max Daily Amount: 6 Tabs. 12 Tab 0     No current facility-administered medications on file prior to visit.        Past Medical History:   Diagnosis Date   ??? Asthma    ??? Chronic pain    ??? Sciatica        Social History     Social History   ??? Marital status: SINGLE     Spouse name: N/A   ??? Number of children: N/A   ??? 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 Smoking status: Former Smoker     Packs/day: 0.50     Types: Cigarettes   ??? Smokeless tobacco: Never Used   ??? Alcohol use No      Comment: occasional "pt stated that he stop drinking"   ??? Drug use: No    ??? Sexual activity: Not on file     Other Topics Concern   ??? Not on file     Social History Narrative       Family History   Problem Relation Age of Onset   ??? Hypertension Mother    ??? Hypertension Father    ??? Asthma Sister        Review of Systems - Negative except as noted in HPI     O:  Visit Vitals   ??? BP (!) 163/119 (BP 1 Location: Right arm, BP Patient Position: Sitting)   ??? Pulse 80   ??? Temp 98 ??F (36.7 ??C) (Oral)   ??? Resp 16   ??? Ht 5\' 4"  (1.626 m)   ??? Wt 155 lb (70.3 kg)   ??? SpO2 98%   ??? BMI 26.61 kg/m2     NAD, comfortable  No LAD  RRR, no murmurs  CTABL, no wheezing/ronchi/rales  Left second toe amputation, no fluctuance or induration, no surrounding erythema, sutures still in place, mild TTP at the base.  Cold distal lower extremities, 1+ dorsalis pedis pulse bilaterally.   No edema  Normal fait    35 y.o. male      ICD-10-CM ICD-9-CM    1. Peripheral vascular disease (HCC) I73.9 443.9 ANKLE BRACHIAL INDEX      REFERRAL TO VASCULAR CENTER      LDL, DIRECT   2. History of amputation of lesser toe of left foot (HCC) Z89.422 V49.72 REFERRAL TO PODIATRY      AMB POC XRAY, FOOT; COMPLETE, 3+ VIEW  Medical Records request [facility]   3. Neuropathic pain M79.2 729.2 HEMOGLOBIN A1C W/O EAG      METABOLIC PANEL, COMPREHENSIVE      CBC WITH MANUAL DIFF      ketorolac tromethamine (TORADOL) 60 mg/2 mL soln      KETOROLAC TROMETHAMINE INJ      PR THER/PROPH/DIAG INJECTION, SUBCUT/IM      meloxicam (MOBIC) 15 mg tablet      desipramine (NOPRAMIN) 150 mg tablet      RPR  Spent over 30 minutes discussing chronic pain management options, risks of prolonged opiate use, risks and benefits of neuropathic pain medications.   Follow up in 4 weeks   4. Elevated BP without diagnosis of hypertension R03.0 796.2 Likely secondary to pain levels, will follow up within 4 weeks for repeat BP check

## 2015-08-20 NOTE — Telephone Encounter (Signed)
Called and verified with two identifiers and spoke with patient advised that I called to set up his appointment with the podiatrist and they never heard of his insurance and so that he needed to call his insurance company and see who is in network that he can go to and call me back.  Patient stated he will call me back with that information.

## 2015-08-22 NOTE — Telephone Encounter (Signed)
Called and spoke with patient verified with two identifiers advised that his appointment with the podiatrist is Aug 30, 2015 at 1:30 pm gave him the address of: [address], [place], [place] [id number].  Patient verbalized understanding.

## 2015-08-22 NOTE — Telephone Encounter (Signed)
Called left patient message (non secure line) asked William Griffin to call me back today I have his appointment information.    When patient calls he is set [facility] with [facility] for Aug 30, 2015 at 1:30 pm he needs to bring ID and insurance card with him.  He can also call daily to see if anyone cancels to get in sooner the number he can call is [phone].    I will be faxing over his last note and referral information.

## 2015-08-22 NOTE — Telephone Encounter (Signed)
Spoke with Herbert SetaHeather with [employer] verified with two patient identifiers.  Asked Herbert SetaHeather if there where any podiatrist in net work for patient she stated that I would have to call [facility] or go on line to talk with them about any in network provider for more discounts for patient that at this time he can see anyone but they may have a preferred specialist that patient can get more discounts on his visit at this time he get three visits per year at a 50.00 rate that includes seeing his PCP as well.

## 2015-08-24 ENCOUNTER — Inpatient Hospital Stay: Payer: Self-pay | Attending: Family Medicine | Primary: Family

## 2015-09-27 ENCOUNTER — Ambulatory Visit
Admit: 2015-09-27 | Discharge: 2015-09-27 | Payer: PRIVATE HEALTH INSURANCE | Attending: Family Medicine | Primary: Family

## 2015-09-27 DIAGNOSIS — IMO0001 Reserved for inherently not codable concepts without codable children: Secondary | ICD-10-CM

## 2015-09-27 NOTE — Progress Notes (Deleted)
No chief complaint on file.    1. Have you been to the ER, urgent care clinic since your last visit?  Hospitalized since your last visit?{Yes when where and reason for visit:20441}    2. Have you seen or consulted any other health care providers outside of the [facility] since your last visit? {Yes when where and reason for visit:20441}    3.   When was your last Pap smear?   When was your last Mammogram?   When was your last Colon screening?    PMH/FH/Social Hx reviewed and updated as needed      Applicable screenings reviewed and updated as needed  Medication reconciliation performed. Patient {DOES_DOES ZOX:[id number]}OT:[id number]} need medication refills.  Health Maintenance reviewed.

## 2015-09-27 NOTE — Progress Notes (Signed)
Pt left without being seen

## 2015-10-01 DIAGNOSIS — F10129 Alcohol abuse with intoxication, unspecified: Secondary | ICD-10-CM

## 2015-10-01 NOTE — ED Provider Notes (Signed)
HPI Comments: William Griffin is a 35 y.o. Male with h/o chronic pain, narcotic use, found unresponsive per roommate, brought here, thinks he may have ovedosed on medication. Pt unable to give much history at this time due to poor responsiveness    The history is provided by a friend and the patient. The history is limited by the condition of the patient.        Past Medical History:   Diagnosis Date   ??? Asthma    ??? Chronic pain    ??? Sciatica        Past Surgical History:   Procedure Laterality Date   ??? 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 HX ORTHOPAEDIC           Family History:   Problem Relation Age of Onset   ??? Hypertension Mother    ??? Hypertension Father    ??? Asthma Sister        Social History     Social History   ??? Marital status: SINGLE     Spouse name: N/A   ??? Number of children: N/A   ??? 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 Smoking status: Former Smoker     Packs/day: 0.50     Types: Cigarettes   ??? Smokeless tobacco: Never Used   ??? Alcohol use No      Comment: occasional "pt stated that he stop drinking"   ??? Drug use: No   ??? Sexual activity: Not on file     Other Topics Concern   ??? Not on file     Social History Narrative         ALLERGIES: Review of patient's allergies indicates no known allergies.    Review of Systems   Unable to perform ROS: Mental status change       Vitals:    10/02/15 0245 10/02/15 0300 10/02/15 0315 10/02/15 0330   BP: 137/79 (!) 133/96 (!) 135/99 (!) 136/99   Pulse: 93 91 89 90   Resp: _0 Temp:       SpO2: 99% 98% 98% 98%   Weight:       Height:                Physical Exam   Constitutional:  Non-toxic appearance. He does not appear ill. No distress.   Resting comfortably in room     HENT:   Head: Normocephalic and atraumatic.   Right Ear: External ear normal.   Left Ear: External ear normal.   Nose: Nose normal.   Mouth/Throat: Oropharynx is clear and moist. No oropharyngeal exudate.    Eyes: Conjunctivae are normal. Pupils are equal, round, and reactive to light.   Neck: Normal range of motion.   Cardiovascular: Regular rhythm, normal heart sounds and intact distal pulses.  Tachycardia present.    Pulmonary/Chest: Effort normal and breath sounds normal. No respiratory distress.   Abdominal: Soft. There is no tenderness.   Musculoskeletal: Normal range of motion. He exhibits no edema.   Neurological:   Will respond with normal voice after repeated physical stimulation moving all ext.      Skin: Skin is warm and dry. He is not diaphoretic.   Psychiatric: His behavior is normal.   Nursing note and vitals reviewed.  MDM  ED Course       Procedures    Vitals:  Patient Vitals for the past 12 hrs:   Temp Pulse Resp BP SpO2   10/02/15 0330 - 90 13 (!) 136/99 98 %   10/02/15 0315 - 89 13 (!) 135/99 98 %   10/02/15 0300 - 91 12 (!) 133/96 98 %   10/02/15 0245 - 93 11 137/79 99 %   10/02/15 0230 - 96 12 138/76 99 %   10/02/15 0215 - 98 13 131/82 97 %   10/02/15 0200 - 95 12 (!) 139/95 98 %   10/02/15 0145 - 94 12 (!) 138/94 98 %   10/02/15 0130 - 95 12 (!) 139/98 98 %   10/02/15 0115 - 94 13 (!) 138/95 98 %   10/02/15 0100 - 96 12 (!) 141/94 98 %   10/02/15 0045 - (!) 101 14 130/78 98 %   10/02/15 0030 - 94 14 135/74 100 %   10/02/15 0015 - 92 13 143/85 100 %   10/02/15 0001 - 93 13 (!) 142/91 100 %   10/01/15 2345 - 99 14 150/82 100 %   10/01/15 2330 - (!) 101 14 140/82 100 %   10/01/15 2315 - (!) 106 18 127/82 100 %   10/01/15 2300 - (!) 105 15 (!) 123/100 100 %   10/01/15 2245 - (!) 107 13 137/90 100 %   10/01/15 2241 - (!) 107 14 (!) 135/91 99 %   10/01/15 2236 - - - - 90 %   10/01/15 2158 98 ??F (36.7 ??C) (!) 125 17 135/79 98 %         Medications ordered:   Medications   sodium chloride 0.9 % bolus infusion 1,000 mL (not administered)   sodium chloride 0.9 % bolus infusion 1,000 mL (0 mL IntraVENous IV Completed 10/01/15 2330)    naloxone [facility]) injection 0.4 mg (0.4 mg IntraVENous Given 10/01/15 2238)         Lab findings:  Recent Results (from the past 12 hour(s))   CBC WITH AUTOMATED DIFF    Collection Time: 10/01/15 10:05 PM   Result Value Ref Range    WBC 8.2 4.6 - 13.2 K/uL    RBC 4.61 (L) 4.70 - 5.50 M/uL    HGB 12.6 (L) 13.0 - 16.0 g/dL    HCT 37.7 36.0 - 48.0 %    MCV 81.8 74.0 - 97.0 FL    MCH 27.3 24.0 - 34.0 PG    MCHC 33.4 31.0 - 37.0 g/dL    RDW 16.0 (H) 11.6 - 14.5 %    PLATELET 266 135 - 420 K/uL    MPV 10.4 9.2 - 11.8 FL    NEUTROPHILS 61 40 - 73 %    LYMPHOCYTES 31 21 - 52 %    MONOCYTES 8 3 - 10 %    EOSINOPHILS 0 0 - 5 %    BASOPHILS 0 0 - 2 %    ABS. NEUTROPHILS 5.0 1.8 - 8.0 K/UL    ABS. LYMPHOCYTES 2.6 0.9 - 3.6 K/UL    ABS. MONOCYTES 0.6 0.05 - 1.2 K/UL    ABS. EOSINOPHILS 0.0 0.0 - 0.4 K/UL    ABS. BASOPHILS 0.0 0.0 - 0.06 K/UL    DF AUTOMATED     METABOLIC PANEL, COMPREHENSIVE    Collection Time: 10/01/15 10:05 PM   Result Value Ref Range    Sodium 141 136 - 145 mmol/L  Potassium 3.4 (L) 3.5 - 5.5 mmol/L    Chloride 106 100 - 108 mmol/L    CO2 23 21 - 32 mmol/L    Anion gap 12 3.0 - 18 mmol/L    Glucose 96 74 - 99 mg/dL    BUN 13 7.0 - 18 MG/DL    Creatinine 1.14 0.6 - 1.3 MG/DL    BUN/Creatinine ratio 11 (L) 12 - 20      GFR est AA >60 >60 ml/min/1.64m    GFR est non-AA >60 >60 ml/min/1.754m   Calcium 8.8 8.5 - 10.1 MG/DL    Bilirubin, total 0.2 0.2 - 1.0 MG/DL    ALT (SGPT) 55 16 - 61 U/L    AST (SGOT) 29 15 - 37 U/L    Alk. phosphatase 64 45 - 117 U/L    Protein, total 8.2 6.4 - 8.2 g/dL    Albumin 3.1 (L) 3.4 - 5.0 g/dL    Globulin 5.1 (H) 2.0 - 4.0 g/dL    A-G Ratio 0.6 (L) 0.8 - 1.7     ETHYL ALCOHOL    Collection Time: 10/01/15 10:05 PM   Result Value Ref Range    ALCOHOL(ETHYL),SERUM 199 (H) 0 - 3 MG/DL   ACETAMINOPHEN    Collection Time: 10/01/15 10:05 PM   Result Value Ref Range    Acetaminophen level <2 (L) 10 - 30 ug/mL   SALICYLATE    Collection Time: 10/01/15 10:05 PM   Result Value Ref Range     SALICYLATE <2<3.1L) 2.8 - 20.0 MG/DL   EKG, 12 LEAD, INITIAL    Collection Time: 10/01/15 10:05 PM   Result Value Ref Range    Ventricular Rate 116 BPM    Atrial Rate 116 BPM    P-R Interval 134 ms    QRS Duration 90 ms    Q-T Interval 312 ms    QTC Calculation (Bezet) 433 ms    Calculated P Axis 53 degrees    Calculated R Axis 55 degrees    Calculated T Axis 10 degrees    Diagnosis       Sinus tachycardia  Nonspecific T wave abnormality  Abnormal ECG  When compared with ECG of 04-Aug-2013 23:02,  No significant change was found     URINALYSIS W/ RFLX MICROSCOPIC    Collection Time: 10/01/15 10:35 PM   Result Value Ref Range    Color YELLOW      Appearance CLEAR      Specific gravity 1.008 1.005 - 1.030      pH (UA) 6.5 5.0 - 8.0      Protein NEGATIVE  NEG mg/dL    Glucose NEGATIVE  NEG mg/dL    Ketone NEGATIVE  NEG mg/dL    Bilirubin NEGATIVE  NEG      Blood NEGATIVE  NEG      Urobilinogen 0.2 0.2 - 1.0 EU/dL    Nitrites NEGATIVE  NEG      Leukocyte Esterase NEGATIVE  NEG     DRUG SCREEN, URINE    Collection Time: 10/01/15 10:35 PM   Result Value Ref Range    BENZODIAZEPINE NEGATIVE  NEG      BARBITURATES NEGATIVE  NEG      THC (TH-CANNABINOL) NEGATIVE  NEG      OPIATES NEGATIVE  NEG      PCP(PHENCYCLIDINE) NEGATIVE  NEG      COCAINE NEGATIVE  NEG      AMPHETAMINE NEGATIVE  NEG  METHADONE NEGATIVE       HDSCOM (NOTE)        EKG interpretation by ED Physician:      X-Ray, CT or other radiology findings or impressions:  No orders to display       Progress notes, Consult notes or additional Procedure notes:   1:52 AM  More alert, awakes more easily with verbal and tactile stim  Suspect alcohol intoxication as etiology to ams  Denies suicide attempt, si, hi.   C/o hurting all over. Just seen recently at [facility] for same  4:10 AM  Awake, alert, talkative  Doubt need for other work up. Can be d/c home once able to get ride  I have discussed with patient and/or family/sig other the results,  interpretation of any imaging if performed, suspected diagnosis and treatment plan to include instructions regarding the diagnoses listed to which understanding was expressed with all questions answered    Reevaluation of patient:   Stable for dc    Disposition:  Diagnosis:   1. Alcohol intoxication, with unspecified complication ([place])    2. Other chronic pain        Disposition: home      Follow-up Information     Follow up With Details Comments Contact Info    Foye Clock, MD Schedule an appointment as soon as possible for a visit  [address]  [address]  [place] [address]  [phone]              Patient's Medications   Start Taking    No medications on file   Continue Taking    CLOPIDOGREL (PLAVIX) 75 MG TAB    Take 75 mg by mouth.    DESIPRAMINE (NOPRAMIN) 150 MG TABLET    Take 1 Tab by mouth nightly. For nerve pain    DOXYCYCLINE (ADOXA) 100 MG TABLET    Take 100 mg by mouth two (2) times a day.    HYDROCODONE-ACETAMINOPHEN (NORCO) 5-325 MG PER TABLET    Take 1 Tab by mouth every four (4) hours as needed for Pain. Max Daily Amount: 6 Tabs.    MELOXICAM (MOBIC) 15 MG TABLET    Take 1 Tab by mouth daily as needed for Pain.   These Medications have changed    No medications on file   Stop Taking    No medications on file

## 2015-10-01 NOTE — ED Notes (Signed)
I performed a brief evaluation, including history and physical, of the patient here in triage and I have determined that pt will need further treatment and evaluation from the main side ER physician.  I have placed initial orders to help in expediting patients care.     October 01, 2015 at 9:58 PM - Leafy KindleJaime S Conor Lata, MD        Visit Vitals   ??? BP 135/79 (BP 1 Location: Right arm, BP Patient Position: Sitting)   ??? Pulse (!) 125   ??? Temp 98 ??F (36.7 ??C)   ??? Resp 17   ??? Ht 5\' 4"  (1.626 m)   ??? Wt 72.6 kg (160 lb)   ??? SpO2 98%   ??? BMI 27.46 kg/m2          Pt brought by roommate due to decreased responsiveness, suspect possible overdose.  Pt has h/o chronic pain.  Pt arouses to sternal rub - states "hey man, get off me", PERRL.

## 2015-10-01 NOTE — ED Notes (Signed)
Unable to place patient on cardiac monitor at this time. No monitors available, patient currently waiting on a mainside bed. Placed on pulse ox to evaluate oxygenation and heart rate. Will place patient on monitor when patient is transferred to main side or when cardiac monitor becomes available.

## 2015-10-01 NOTE — ED Triage Notes (Signed)
Patient's roommate states he found patient unresponsive at home. States he has history of drug use. Patient responds to pain.

## 2015-10-01 NOTE — ED Notes (Signed)
Assuming care of patient at this time

## 2015-10-01 NOTE — ED Notes (Signed)
With sternal rub patient yelling "leave me alone" at this time otherwise still drowsy and unresponsive

## 2015-10-02 ENCOUNTER — Inpatient Hospital Stay
Admit: 2015-10-02 | Discharge: 2015-10-02 | Disposition: A | Discharge status: Home / Self Care | Payer: Self-pay | Attending: Emergency Medicine

## 2015-10-02 LAB — EKG 12-LEAD
Atrial Rate: 116 {beats}/min
P Axis: 53 degrees
P-R Interval: 134 ms
Q-T Interval: 312 ms
QRS Duration: 90 ms
QTc Calculation (Bazett): 433 ms
R Axis: 55 degrees
T Axis: 10 degrees
Ventricular Rate: 116 {beats}/min

## 2015-10-02 LAB — CBC WITH AUTOMATED DIFF
ABS. BASOPHILS: 0 10*3/uL (ref 0.0–0.06)
ABS. EOSINOPHILS: 0 10*3/uL (ref 0.0–0.4)
ABS. LYMPHOCYTES: 2.6 10*3/uL (ref 0.9–3.6)
ABS. MONOCYTES: 0.6 10*3/uL (ref 0.05–1.2)
ABS. NEUTROPHILS: 5 10*3/uL (ref 1.8–8.0)
BASOPHILS: 0 % (ref 0–2)
EOSINOPHILS: 0 % (ref 0–5)
HCT: 37.7 % (ref 36.0–48.0)
HGB: 12.6 g/dL — ABNORMAL LOW (ref 13.0–16.0)
LYMPHOCYTES: 31 % (ref 21–52)
MCH: 27.3 PG (ref 24.0–34.0)
MCHC: 33.4 g/dL (ref 31.0–37.0)
MCV: 81.8 FL (ref 74.0–97.0)
MONOCYTES: 8 % (ref 3–10)
MPV: 10.4 FL (ref 9.2–11.8)
NEUTROPHILS: 61 % (ref 40–73)
PLATELET: 266 10*3/uL (ref 135–420)
RBC: 4.61 M/uL — ABNORMAL LOW (ref 4.70–5.50)
RDW: 16 % — ABNORMAL HIGH (ref 11.6–14.5)
WBC: 8.2 10*3/uL (ref 4.6–13.2)

## 2015-10-02 LAB — METABOLIC PANEL, COMPREHENSIVE
A-G Ratio: 0.6 — ABNORMAL LOW (ref 0.8–1.7)
ALT (SGPT): 55 U/L (ref 16–61)
AST (SGOT): 29 U/L (ref 15–37)
Albumin: 3.1 g/dL — ABNORMAL LOW (ref 3.4–5.0)
Alk. phosphatase: 64 U/L (ref 45–117)
Anion gap: 12 mmol/L (ref 3.0–18)
BUN/Creatinine ratio: 11 — ABNORMAL LOW (ref 12–20)
BUN: 13 MG/DL (ref 7.0–18)
Bilirubin, total: 0.2 MG/DL (ref 0.2–1.0)
CO2: 23 mmol/L (ref 21–32)
Calcium: 8.8 MG/DL (ref 8.5–10.1)
Chloride: 106 mmol/L (ref 100–108)
Creatinine: 1.14 MG/DL (ref 0.6–1.3)
GFR est AA: >60 mL/min/{1.73_m2} (ref >60)
GFR est non-AA: >60 mL/min/{1.73_m2} (ref >60)
Globulin: 5.1 g/dL — ABNORMAL HIGH (ref 2.0–4.0)
Glucose: 96 mg/dL (ref 74–99)
Potassium: 3.4 mmol/L — ABNORMAL LOW (ref 3.5–5.5)
Protein, total: 8.2 g/dL (ref 6.4–8.2)
Sodium: 141 mmol/L (ref 136–145)

## 2015-10-02 LAB — URINALYSIS W/ RFLX MICROSCOPIC
Bilirubin: NEGATIVE
Blood: NEGATIVE
Glucose: NEGATIVE mg/dL
Ketone: NEGATIVE mg/dL
Leukocyte Esterase: NEGATIVE
Nitrites: NEGATIVE
Protein: NEGATIVE mg/dL
Specific gravity: 1.008 (ref 1.005–1.030)
Urobilinogen: 0.2 EU/dL (ref 0.2–1.0)
pH (UA): 6.5 (ref 5.0–8.0)

## 2015-10-02 LAB — ETHYL ALCOHOL: ALCOHOL(ETHYL),SERUM: 199 MG/DL — ABNORMAL HIGH (ref 0–3)

## 2015-10-02 LAB — DRUG SCREEN, URINE
AMPHETAMINES: NEGATIVE
BARBITURATES: NEGATIVE
BENZODIAZEPINES: NEGATIVE
COCAINE: NEGATIVE
METHADONE: NEGATIVE
OPIATES: NEGATIVE
PCP(PHENCYCLIDINE): NEGATIVE
THC (TH-CANNABINOL): NEGATIVE

## 2015-10-02 LAB — EKG, 12 LEAD, INITIAL
Atrial Rate: 116 {beats}/min
Calculated P Axis: 53 degrees
Calculated R Axis: 55 degrees
Calculated T Axis: 10 degrees
P-R Interval: 134 ms
Q-T Interval: 312 ms
QRS Duration: 90 ms
QTC Calculation (Bezet): 433 ms
Ventricular Rate: 116 {beats}/min

## 2015-10-02 LAB — SALICYLATE: Salicylate level: <2.8 MG/DL — ABNORMAL LOW (ref 2.8–20.0)

## 2015-10-02 LAB — ACETAMINOPHEN: Acetaminophen level: <2 ug/mL — ABNORMAL LOW (ref 10–30)

## 2015-10-02 MED ORDER — ACETAMINOPHEN 500 MG TAB
500 mg | ORAL | Status: AC
Start: 2015-10-02 — End: 2015-10-02
  Administered 2015-10-02: 12:00:00 via ORAL

## 2015-10-02 MED ORDER — NALOXONE 1 MG/ML IJ SYRG(AKA NARCAN)
1 mg/mL | INTRAMUSCULAR | Status: AC
Start: 2015-10-02 — End: 2015-10-01
  Administered 2015-10-02: 03:00:00 via INTRAVENOUS

## 2015-10-02 MED ORDER — SODIUM CHLORIDE 0.9% BOLUS IV
0.9 % | Freq: Once | INTRAVENOUS | Status: AC
Start: 2015-10-02 — End: 2015-10-02
  Administered 2015-10-02: 08:00:00 via INTRAVENOUS

## 2015-10-02 MED ORDER — SODIUM CHLORIDE 0.9% BOLUS IV
0.9 % | Freq: Once | INTRAVENOUS | Status: AC
Start: 2015-10-02 — End: 2015-10-01
  Administered 2015-10-02: 02:00:00 via INTRAVENOUS

## 2015-10-02 MED FILL — MAPAP EXTRA STRENGTH 500 MG TABLET: 500 mg | ORAL | Qty: 2

## 2015-10-02 MED FILL — NALOXONE 1 MG/ML IJ SYRG(AKA NARCAN): 1 mg/mL | INTRAMUSCULAR | Qty: 2

## 2015-10-02 MED FILL — SODIUM CHLORIDE 0.9 % IV: INTRAVENOUS | Qty: 1000

## 2015-10-02 NOTE — ED Notes (Signed)
Elevated ETHOH, no responding to painful stimuli. Can be aroused currently to painful stimuli. Currently A&Ox4.

## 2015-10-02 NOTE — ED Notes (Signed)
Bedside and Verbal shift change report given to Annaleise R (oncoming nurse) by Kerri L (offgoing nurse). Report included the following information SBAR, ED Summary, MAR and Recent Results.

## 2015-10-02 NOTE — ED Notes (Addendum)
Discharging patient for primary nurse Porcia RN. Discharge instructions given to patient, patient verbalizes understanding of instructions. Patient alert and oriented x3, denies pain or shortness of breath at this time. Patient ambulatory, gait steady.   Patient armband removed and given to patient to take home.  Patient was informed of the privacy risks if armband lost or stolen.

## 2015-11-06 ENCOUNTER — Inpatient Hospital Stay
Admit: 2015-11-06 | Discharge: 2015-11-06 | Disposition: A | Discharge status: Home / Self Care | Payer: Self-pay | Attending: Emergency Medicine

## 2015-11-06 DIAGNOSIS — M5431 Sciatica, right side: Secondary | ICD-10-CM

## 2015-11-06 MED ORDER — METHOCARBAMOL 500 MG TAB
500 mg | ORAL | Status: AC
Start: 2015-11-06 — End: 2015-11-06
  Administered 2015-11-06: 13:00:00 via ORAL

## 2015-11-06 MED ORDER — PREDNISONE 10 MG TABLETS IN A DOSE PACK
10 mg | ORAL_TABLET | ORAL | 0 refills | Status: DC
Start: 2015-11-06 — End: 2015-12-08

## 2015-11-06 MED ORDER — METHOCARBAMOL 500 MG TAB
500 mg | ORAL_TABLET | Freq: Four times a day (QID) | ORAL | 0 refills | Status: DC
Start: 2015-11-06 — End: 2016-01-08

## 2015-11-06 MED ORDER — NAPROXEN 375 MG TAB
375 mg | ORAL_TABLET | Freq: Two times a day (BID) | ORAL | 0 refills | Status: DC
Start: 2015-11-06 — End: 2016-01-08

## 2015-11-06 MED ORDER — NAPROXEN 250 MG TAB
250 mg | ORAL | Status: AC
Start: 2015-11-06 — End: 2015-11-06
  Administered 2015-11-06: 13:00:00 via ORAL

## 2015-11-06 MED ORDER — PREDNISONE 20 MG TAB
20 mg | ORAL | Status: DC
Start: 2015-11-06 — End: 2015-11-06

## 2015-11-06 MED ORDER — PREDNISONE 20 MG TAB
20 mg | ORAL | Status: AC
Start: 2015-11-06 — End: 2015-11-06
  Administered 2015-11-06: 13:00:00 via ORAL

## 2015-11-06 MED FILL — METHOCARBAMOL 500 MG TAB: 500 mg | ORAL | Qty: 1

## 2015-11-06 MED FILL — PREDNISONE 20 MG TAB: 20 mg | ORAL | Qty: 3

## 2015-11-06 MED FILL — NAPROXEN 250 MG TAB: 250 mg | ORAL | Qty: 2

## 2015-11-06 NOTE — ED Triage Notes (Signed)
Patient to ED with c/o right hip pain radiating down right thigh. Patient denies any lower back pain. Ambulatory on arrival. Rates pain 8/10. NKDA.

## 2015-11-06 NOTE — ED Provider Notes (Signed)
HPI Comments: 35yoM to ED c/o right upper buttocks pain radiating down right leg. Denies back pain, paresthesias, urinary complaints, weakness or any other symptoms or complaints. Denies injury.    Patient is a 35 y.o. male presenting with hip pain and leg pain. The history is provided by the patient.   Hip Pain      Leg Pain           Past Medical History:   Diagnosis Date   ??? Asthma    ??? Chronic pain    ??? Sciatica        Past Surgical History:   Procedure Laterality Date   ??? 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 HX ORTHOPAEDIC           Family History:   Problem Relation Age of Onset   ??? Hypertension Mother    ??? Hypertension Father    ??? Asthma Sister        Social History     Social History   ??? Marital status: SINGLE     Spouse name: N/A   ??? Number of children: N/A   ??? 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 Smoking status: Former Smoker     Packs/day: 0.50     Types: Cigarettes   ??? Smokeless tobacco: Never Used   ??? Alcohol use No      Comment: occasional "pt stated that he stop drinking"   ??? Drug use: No   ??? Sexual activity: Not on file     Other Topics Concern   ??? Not on file     Social History Narrative         ALLERGIES: Review of patient's allergies indicates no known allergies.    Review of Systems   Musculoskeletal: Positive for arthralgias and myalgias.   All other systems reviewed and are negative.      Vitals:    11/06/15 0838 11/06/15 0847   BP: (!) 146/102 144/90   Pulse: 90 88   Resp: 16 18   Temp: 99.1 ??F (37.3 ??C) 98.1 ??F (36.7 ??C)   SpO2: 97% 97%   Weight: 69.4 kg (153 lb)    Height:  (1.676 m)             Physical Exam   Constitutional: He appears well-developed and well-nourished. No distress.   HENT:   Head: Normocephalic and atraumatic.   Musculoskeletal:        Cervical back: Normal.        Thoracic back: Normal.        Lumbar back: He exhibits normal range of motion, no tenderness, no  bony tenderness, no swelling, no edema, no pain and no spasm.        Back:    Neurological: He has normal strength. No sensory deficit. Coordination and gait normal.   Skin: He is not diaphoretic.        MDM  Number of Diagnoses or Management Options  Sciatica of right side:   Diagnosis management comments: Pt c/o right upper buttocks pain radiating down right leg. Ddx: muscle pain, sciatica, spasm. No neuro deficits. Will treat with muscle relaxant, nsaids, prednisone. Ortho f/u. Pt agrees with plan. Joanna Hews, PA 9:06 AM      Risk of Complications, Morbidity, and/or Mortality  Presenting problems: low  Diagnostic procedures:  minimal  Management options: low    Patient Progress  Patient progress: improved    ED Course       Procedures

## 2015-11-06 NOTE — ED Notes (Signed)
I have reviewed discharge instructions with the patient.  The patient verbalized understanding. I have reviewed the provider's instructions with the patient, answering all questions to his satisfaction.

## 2015-12-08 ENCOUNTER — Inpatient Hospital Stay
Admit: 2015-12-08 | Discharge: 2015-12-08 | Disposition: A | Discharge status: Home / Self Care | Payer: Self-pay | Attending: Emergency Medicine

## 2015-12-08 DIAGNOSIS — M79604 Pain in right leg: Secondary | ICD-10-CM

## 2015-12-08 MED ORDER — KETOROLAC TROMETHAMINE 60 MG/2 ML IM
60 mg/2 mL | INTRAMUSCULAR | Status: AC
Start: 2015-12-08 — End: 2015-12-08
  Administered 2015-12-08: 12:00:00 via INTRAMUSCULAR

## 2015-12-08 MED ORDER — METHOCARBAMOL 500 MG TAB
500 mg | ORAL_TABLET | Freq: Four times a day (QID) | ORAL | 0 refills | Status: DC
Start: 2015-12-08 — End: 2016-01-08

## 2015-12-08 MED FILL — KETOROLAC TROMETHAMINE 60 MG/2 ML IM: 60 mg/2 mL | INTRAMUSCULAR | Qty: 2

## 2015-12-08 NOTE — ED Notes (Signed)
I have reviewed discharge instructions with the patient.  The patient verbalized understanding. Discharge medications reviewed with patient and appropriate educational materials and side effects teaching were provided.

## 2015-12-08 NOTE — ED Provider Notes (Signed)
HPI Comments: 36 yo M with h/o chronic right leg pain c/o right leg pain x several weeks. No precipitating injury. Has been evaluated for same pain here and at [facility] EDs several times over the last few months. Is prescribed tramadol and gabapentin by his PCP for same pain. Seen most recently by PCP on 11/14/15. Denies new injury or any change in pain. No other complaints.     Patient is a 35 y.o. male presenting with leg pain.   Leg Pain           Past Medical History:   Diagnosis Date   ??? Asthma    ??? Chronic pain    ??? Sciatica        Past Surgical History:   Procedure Laterality Date   ??? 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 HX ORTHOPAEDIC           Family History:   Problem Relation Age of Onset   ??? Hypertension Mother    ??? Hypertension Father    ??? Asthma Sister        Social History     Social History   ??? Marital status: SINGLE     Spouse name: N/A   ??? Number of children: N/A   ??? 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 Smoking status: Former Smoker     Packs/day: 0.50     Types: Cigarettes   ??? Smokeless tobacco: Never Used   ??? Alcohol use No      Comment: occasional "pt stated that he stop drinking"   ??? Drug use: No   ??? Sexual activity: Not on file     Other Topics Concern   ??? Not on file     Social History Narrative         ALLERGIES: Review of patient's allergies indicates no known allergies.    Review of Systems   Musculoskeletal: Positive for arthralgias.   All other systems reviewed and are negative.      Vitals:    12/08/15 0803 12/08/15 0812   BP: (!) 156/111    Pulse: 95    Resp: 17    Temp: 98.7 ??F (37.1 ??C)    SpO2: 100% 100%   Weight: 70.8 kg (156 lb)    Height: 5\' 5"  (1.651 m)             Physical Exam   Constitutional: He is oriented to person, place, and time. He appears well-developed and well-nourished. No distress.   Thin    HENT:   Head: Normocephalic and atraumatic.   Eyes: Conjunctivae are normal.    Neck: Normal range of motion. Neck supple.   Cardiovascular: Normal rate, regular rhythm and normal heart sounds.    Pulmonary/Chest: Effort normal and breath sounds normal. No respiratory distress. He has no wheezes. He has no rales.   Musculoskeletal: Normal range of motion.   TTP over right SI joint around to greater trochanter. Pain with ROM of hip. Able to bear weight unassisted. Sensation to leg intact.    Neurological: He is alert and oriented to person, place, and time.   Skin: Skin is warm and dry.   Psychiatric: He has a normal mood and affect. His behavior is normal. Judgment and thought content normal.   Nursing note and vitals reviewed.  MDM  Number of Diagnoses or Management Options  Chronic pain of right lower extremity:     ED Course       Procedures    -------------------------------------------------------------------------------------------------------------------     EKG INTERPRETATIONS:      RADIOLOGY RESULTS:   No orders to display       LABORATORY RESULTS:  No results found for this or any previous visit (from the past 12 hour(s)).        CONSULTATIONS:        PROGRESS NOTES:    8:24 AM Pt well appearing and in NAD. Review of chart shows multiple ED and other visits for same complaint. Needs ortho evaluation. Assume [facility] will be arranging this, but gave ortho f/u as well. Return precautions given.     Lengthy D/W pt regarding possible worsening of pt's condition, need for follow up and strict ED return instructions for any worsening symptoms.     DISPOSITION:  ED DIAGNOSIS & DISPOSITION INFORMATION  Diagnosis:   1. Chronic pain of right lower extremity          Disposition: home    Follow-up Information     Follow up With Details Comments Contact Info    [facility] Call To make a follow up appointment [address]  [place] [address]  [phone]    [facility]  Immediately if symptoms worsen [address]   [place] [address]  [phone]          Patient's Medications   Start Taking    METHOCARBAMOL (ROBAXIN) 500 MG TABLET    Take 1 Tab by mouth four (4) times daily.   Continue Taking    METHOCARBAMOL (ROBAXIN) 500 MG TABLET    Take 1 Tab by mouth four (4) times daily.    NAPROXEN (NAPROSYN) 375 MG TABLET    Take 1 Tab by mouth two (2) times daily (with meals).   These Medications have changed    No medications on file   Stop Taking    PREDNISONE (STERAPRED DS) 10 MG DOSE PACK    Per dose pack

## 2015-12-08 NOTE — ED Triage Notes (Signed)
Pt stated he is having chronic right leg pain which began a month ago. Pt was seen here a few weeks ago for it, and was told he had nerve damage.

## 2015-12-31 ENCOUNTER — Inpatient Hospital Stay
Admit: 2015-12-31 | Discharge: 2015-12-31 | Disposition: A | Discharge status: Home / Self Care | Payer: Self-pay | Attending: Emergency Medicine

## 2015-12-31 DIAGNOSIS — M25551 Pain in right hip: Secondary | ICD-10-CM

## 2015-12-31 MED ORDER — GABAPENTIN 300 MG CAP
300 mg | ORAL_CAPSULE | Freq: Three times a day (TID) | ORAL | 0 refills | Status: DC
Start: 2015-12-31 — End: 2015-12-31

## 2015-12-31 MED ORDER — TRAMADOL 50 MG TAB
50 mg | ORAL_TABLET | Freq: Four times a day (QID) | ORAL | 0 refills | Status: DC | PRN
Start: 2015-12-31 — End: 2016-01-08

## 2015-12-31 MED ORDER — GABAPENTIN 300 MG CAP
300 mg | ORAL_CAPSULE | Freq: Three times a day (TID) | ORAL | 0 refills | Status: DC
Start: 2015-12-31 — End: 2016-01-29

## 2015-12-31 NOTE — ED Triage Notes (Signed)
right hip and leg pain, "its my nerve. I lost my [presciption for muscle relaxer"

## 2015-12-31 NOTE — ED Provider Notes (Signed)
HPI Comments: 12:22 PM William Griffin is a 35 y.o. male with h/o chronic pain and neuropathy who presents to ED complaining of right sided hip pain that is radiating to his groin area and down to his knee. The pt states that the pain feels like a burning sensation and a sharp pain like theres a pin sticking him. The pt states he was prescribed gabapentin in the past wilt some relief, but has not been able to afford the prescription since moving to this area. Pt is working on getting a PCP. The pt denies having lymph nodes. Pt states he had no other complaints or concerns.       PCP: Eliane Decreehristopher R Zamani, MD      The history is provided by the patient. No language interpreter was used.        Past Medical History:   Diagnosis Date   ??? Asthma    ??? Chronic pain    ??? Sciatica        Past Surgical History:   Procedure Laterality Date   ??? 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 HX ORTHOPAEDIC           Family History:   Problem Relation Age of Onset   ??? Hypertension Mother    ??? Hypertension Father    ??? Asthma Sister        Social History     Social History   ??? Marital status: SINGLE     Spouse name: N/A   ??? Number of children: N/A   ??? 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 Smoking status: Former Smoker     Packs/day: 0.50     Types: Cigarettes   ??? Smokeless tobacco: Never Used   ??? Alcohol use No      Comment: occasional "pt stated that he stop drinking"   ??? Drug use: No   ??? Sexual activity: Not on file     Other Topics Concern   ??? Not on file     Social History Narrative         ALLERGIES: Review of patient's allergies indicates no known allergies.    Review of Systems   Constitutional: Negative.  Negative for chills, diaphoresis and fever.   HENT: Negative.  Negative for congestion, rhinorrhea and sore throat.    Eyes: Negative.  Negative for pain, discharge and redness.   Respiratory: Negative.  Negative for cough, chest tightness, shortness of  breath and wheezing.    Cardiovascular: Negative.  Negative for chest pain.   Gastrointestinal: Negative.  Negative for abdominal pain, constipation, diarrhea, nausea and vomiting.   Genitourinary: Negative.  Negative for dysuria, flank pain, frequency, hematuria and urgency.   Musculoskeletal: Positive for myalgias (right hip pain ). Negative for back pain and neck pain.   Skin: Negative.  Negative for rash.   Neurological: Negative.  Negative for syncope, weakness, numbness and headaches.   Psychiatric/Behavioral: Negative.    All other systems reviewed and are negative.      Vitals:    12/31/15 1217   BP: (!) 142/105   Pulse: (!) 101   Resp: 20   Temp: 98.2 ??F (36.8 ??C)   SpO2: 100%            Physical Exam   Constitutional: He appears well-developed and well-nourished.  Non-toxic appearance.  He does not have a sickly appearance. He does not appear ill. No distress.   HENT:   Head: Normocephalic and atraumatic.   Mouth/Throat: Oropharynx is clear and moist. No oropharyngeal exudate.   Eyes: Conjunctivae and EOM are normal. Pupils are equal, round, and reactive to light. No scleral icterus.   Neck: Normal range of motion. Neck supple. No hepatojugular reflux and no JVD present. No tracheal deviation present. No thyromegaly present.   Cardiovascular: Normal rate, regular rhythm, S1 normal, S2 normal, normal heart sounds, intact distal pulses and normal pulses.  Exam reveals no gallop, no S3 and no S4.    No murmur heard.  Pulses:       Radial pulses are 2+ on the right side, and 2+ on the left side.        Dorsalis pedis pulses are 2+ on the right side, and 2+ on the left side.   Pulmonary/Chest: Effort normal and breath sounds normal. No respiratory distress. He has no decreased breath sounds. He has no wheezes. He has no rhonchi. He has no rales.   Abdominal: Soft. Normal appearance and bowel sounds are normal. He exhibits no distension and no mass. There is no hepatosplenomegaly. There  is no tenderness. There is no rigidity, no rebound, no guarding, no CVA tenderness, no tenderness at McBurney's point and negative Murphy's sign.   Musculoskeletal: Normal range of motion.        Legs:  Lymphadenopathy:        Head (right side): No submental, no submandibular, no preauricular and no occipital adenopathy present.        Head (left side): No submental, no submandibular, no preauricular and no occipital adenopathy present.     He has no cervical adenopathy.        Right: No supraclavicular adenopathy present.        Left: No supraclavicular adenopathy present.   Neurological: He is alert. He has normal strength and normal reflexes. He is not disoriented. No cranial nerve deficit or sensory deficit. Coordination and gait normal. GCS eye subscore is 4. GCS verbal subscore is 5. GCS motor subscore is 6.   Skin: Skin is warm, dry and intact. No rash noted. He is not diaphoretic.   Psychiatric: He has a normal mood and affect. His speech is normal and behavior is normal. Judgment and thought content normal. Cognition and memory are normal.   Nursing note and vitals reviewed.       MDM  Number of Diagnoses or Management Options  Neuropathic pain:   Pain of right hip joint:     ED Course       Procedures      I have assessed the patient.  Patient is feeling the same.  Patient will be prescribed Gabapentin and Tramadol.  Patient was discharged in stable condition.  Patient is to return to emergency department if any new or worsening condition.      Scribe Attestation      Alfonse Alpers acting as a Neurosurgeon for and in the presence of Neal Dy, DO      December 31, 2015 at 12:22 PM       Provider Attestation:      I personally performed the services described in the documentation, reviewed the documentation, as recorded by the scribe in my presence, and it accurately and completely records my words and actions. December 31, 2015 at 12:22 PM - Helene Shoe Bobi Daudelin, DO

## 2016-01-08 ENCOUNTER — Inpatient Hospital Stay: Admit: 2016-01-08 | Primary: Family

## 2016-01-08 ENCOUNTER — Ambulatory Visit: Admit: 2016-01-08 | Payer: PRIVATE HEALTH INSURANCE | Attending: Family | Primary: Family

## 2016-01-08 DIAGNOSIS — Z7689 Persons encountering health services in other specified circumstances: Secondary | ICD-10-CM

## 2016-01-08 LAB — HIV 1/2 ANTIGEN/ANTIBODY, FOURTH GENERATION W/RFL: Interpretation: NONREACTIVE

## 2016-01-08 LAB — URINALYSIS W/ RFLX MICROSCOPIC
Bilirubin: NEGATIVE
Blood: NEGATIVE
Glucose: NEGATIVE mg/dL
Ketone: NEGATIVE mg/dL
Leukocyte Esterase: NEGATIVE
Nitrites: NEGATIVE
Protein: NEGATIVE mg/dL
Specific gravity: 1.017 (ref 1.005–1.030)
Urobilinogen: 0.2 EU/dL (ref 0.2–1.0)
pH (UA): 5 (ref 5.0–8.0)

## 2016-01-08 LAB — LIPID PANEL
CHOL/HDL Ratio: 4.2 (ref 0–5.0)
Cholesterol, total: 162 MG/DL (ref <200)
HDL Cholesterol: 39 MG/DL — ABNORMAL LOW (ref 40–60)
LDL, calculated: 109.4 MG/DL — ABNORMAL HIGH (ref 0–100)
Triglyceride: 68 MG/DL (ref <150)
VLDL, calculated: 13.6 MG/DL

## 2016-01-08 LAB — METABOLIC PANEL, COMPREHENSIVE
A-G Ratio: 0.7 — ABNORMAL LOW (ref 0.8–1.7)
ALT (SGPT): 28 U/L (ref 16–61)
AST (SGOT): 15 U/L (ref 15–37)
Albumin: 3.5 g/dL (ref 3.4–5.0)
Alk. phosphatase: 74 U/L (ref 45–117)
Anion gap: 8 mmol/L (ref 3.0–18)
BUN/Creatinine ratio: 12 (ref 12–20)
BUN: 10 MG/DL (ref 7.0–18)
Bilirubin, total: 0.3 MG/DL (ref 0.2–1.0)
CO2: 24 mmol/L (ref 21–32)
Calcium: 9.2 MG/DL (ref 8.5–10.1)
Chloride: 109 mmol/L — ABNORMAL HIGH (ref 100–108)
Creatinine: 0.82 MG/DL (ref 0.6–1.3)
GFR est AA: >60 mL/min/{1.73_m2} (ref >60)
GFR est non-AA: >60 mL/min/{1.73_m2} (ref >60)
Globulin: 4.8 g/dL — ABNORMAL HIGH (ref 2.0–4.0)
Glucose: 100 mg/dL — ABNORMAL HIGH (ref 74–99)
Potassium: 4.6 mmol/L (ref 3.5–5.5)
Protein, total: 8.3 g/dL — ABNORMAL HIGH (ref 6.4–8.2)
Sodium: 141 mmol/L (ref 136–145)

## 2016-01-08 LAB — CBC WITH AUTOMATED DIFF
ABS. BASOPHILS: 0 10*3/uL (ref 0.0–0.06)
ABS. EOSINOPHILS: 0.1 10*3/uL (ref 0.0–0.4)
ABS. LYMPHOCYTES: 1.6 10*3/uL (ref 0.9–3.6)
ABS. MONOCYTES: 0.4 10*3/uL (ref 0.05–1.2)
ABS. NEUTROPHILS: 2 10*3/uL (ref 1.8–8.0)
BASOPHILS: 0 % (ref 0–2)
EOSINOPHILS: 1 % (ref 0–5)
HCT: 40.1 % (ref 36.0–48.0)
HGB: 13.2 g/dL (ref 13.0–16.0)
LYMPHOCYTES: 38 % (ref 21–52)
MCH: 27.6 PG (ref 24.0–34.0)
MCHC: 32.9 g/dL (ref 31.0–37.0)
MCV: 83.7 FL (ref 74.0–97.0)
MONOCYTES: 11 % — ABNORMAL HIGH (ref 3–10)
MPV: 10.9 FL (ref 9.2–11.8)
NEUTROPHILS: 50 % (ref 40–73)
PLATELET: 302 10*3/uL (ref 135–420)
RBC: 4.79 M/uL (ref 4.70–5.50)
RDW: 14.6 % — ABNORMAL HIGH (ref 11.6–14.5)
WBC: 4.1 10*3/uL — ABNORMAL LOW (ref 4.6–13.2)

## 2016-01-08 LAB — HIV 1/2 AG/AB, 4TH GENERATION,W RFLX CONFIRM: HIV 1/2 Interpretation: NONREACTIVE

## 2016-01-08 LAB — HEPATITIS PANEL, ACUTE
Hep B surface Ag Interp.: NEGATIVE
Hep C virus Ab Interp.: NEGATIVE
Hepatitis A, IgM: NEGATIVE
Hepatitis B core, IgM: NEGATIVE
Hepatitis B surface Ag: <0.1 Index (ref <1.00)
Hepatitis C virus Ab: 0.12 Index (ref <0.80)

## 2016-01-08 LAB — HEMOGLOBIN A1C WITH EAG
Est. average glucose: 131 mg/dL
Hemoglobin A1c: 6.2 % — ABNORMAL HIGH (ref 4.2–5.6)

## 2016-01-08 LAB — DRUG SCREEN, URINE
AMPHETAMINES: NEGATIVE
BARBITURATES: NEGATIVE
BENZODIAZEPINES: NEGATIVE
COCAINE: NEGATIVE
METHADONE: NEGATIVE
OPIATES: NEGATIVE
PCP(PHENCYCLIDINE): NEGATIVE
THC (TH-CANNABINOL): NEGATIVE

## 2016-01-08 LAB — VITAMIN B12: Vitamin B12: 309 pg/mL (ref 211–911)

## 2016-01-08 LAB — MAGNESIUM: Magnesium: 2 mg/dL (ref 1.6–2.6)

## 2016-01-08 LAB — VITAMIN D, 25 HYDROXY: Vitamin D 25-Hydroxy: 28.5 ng/mL — ABNORMAL LOW (ref 30–100)

## 2016-01-08 LAB — T4, FREE: T4, Free: 0.9 NG/DL (ref 0.7–1.5)

## 2016-01-08 LAB — ETHYL ALCOHOL: ALCOHOL(ETHYL),SERUM: <3 MG/DL (ref 0–3)

## 2016-01-08 LAB — TSH 3RD GENERATION: TSH: 2.25 u[IU]/mL (ref 0.36–3.74)

## 2016-01-08 MED ORDER — NAPROXEN 500 MG TAB
500 mg | ORAL_TABLET | Freq: Two times a day (BID) | ORAL | 1 refills | Status: DC
Start: 2016-01-08 — End: 2016-02-11

## 2016-01-08 MED ORDER — TRAMADOL 50 MG TAB
50 mg | ORAL_TABLET | Freq: Four times a day (QID) | ORAL | 0 refills | Status: DC | PRN
Start: 2016-01-08 — End: 2016-01-22

## 2016-01-08 NOTE — Progress Notes (Signed)
Patient name, date of birth and orders verified before specimen collection. Rt  arm is dry and intact. 21g butterfly  was utilized to collect specimen. Pt tolerated procedure well.

## 2016-01-08 NOTE — Progress Notes (Signed)
[facility]  [address]  [place], [place]  [address]  [phone] office/[phone] fax      01/08/2016    Reason for visit:   Chief Complaint   Patient presents with   ??? Establish Care   ??? Hip Pain     Rt hip pain that radiate to groin. Pt says this is a ongoing pain for about 3 months       Patient: William Griffin, Dec 11, 1980, [id number]       Primary MD: Manfred Shirts, NP    Subjective:   William Griffin, a 35 y.o. male, who is left handed presents for Establish Care and Hip Pain (Rt hip pain that radiate to groin. Pt says this is a ongoing pain for about 3 months)      HPI  The patient with pmhx of sciatica, asthma, and chronic neuropathic pain presents today for establishment of care. He was previously being followed by [facility] for pain. The patient states that symptoms of left leg pain began 3 years ago. He previously worked as a Psychologist, occupational in the ship[employer] and pain was beklieved to be related to his line of work. The patient has had extensive workup for left leg pain with muscle weakness. Previous PVL artery revealed moderate atherosclerosis. 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 He had been followed by Neurology. No previous vascular consult. Has had EMG studies showing nerve damage and MRI head, MRI Spine, and PVL venous LE which were benign. States that he has tried amitrypitiline in the past with no relief. Has not tried cymbalta. States tramadol and gabapentin work for his pain. Reports he has not filled recent gabapentin rx due to money constraint but he will.      Pt also reports right groin with sharp shooting pain intermittently down right leg. Denies Back pain, loss of bowel.bladder, falls, previous trauma or surgery. He denies fevers and myalgias.     ADV DIR:  None      HM:   Previous PCP: Was going to [facility] but was referred here when he moved.  Tetanus Vaccine: ~ 2009, for a job   Flu Vaccine- refuses   Pna- n/a    Eye exam: unknown   Dental exam: unknown     Work status: unemployed     Past Medical History:   Diagnosis Date   ??? Asthma    ??? Chronic pain    ??? Sciatica        Past Surgical History:   Procedure Laterality Date   ??? 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 HX ORTHOPAEDIC         Social History     Social History   ??? Marital status: SINGLE     Spouse name: N/A   ??? Number of children: N/A   ??? 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 Smoking status: Former Smoker     Packs/day: 0.50     Types: Cigarettes   ??? Smokeless tobacco: Never Used      Comment: quit about 5 months ago   ??? Alcohol use No      Comment: occasional "pt stated that he stop drinking"   ??? Drug use: No   ??? Sexual activity: Not on file  Other Topics Concern   ??? Financial plannerMilitary Service Yes     [employer] 6 years   ??? Blood Transfusions No   ??? Caffeine Concern No   ??? Occupational Exposure No   ??? Hobby Hazards No   ??? Sleep Concern No   ??? Stress Concern No   ??? Weight Concern No   ??? Special Diet No   ??? Back Care No   ??? Exercise Yes   ??? Bike Helmet No   ??? Seat Belt Yes     Social History Narrative       No Known Allergies    Current Outpatient Prescriptions on File Prior to Visit   Medication Sig Dispense Refill   ??? gabapentin (NEURONTIN) 300 mg capsule Take 1 Cap by mouth three (3) times daily. 30 Cap 0     No current facility-administered medications on file prior to visit.         Review of Systems   Constitutional: Negative.    HENT: Negative.    Eyes: Negative.    Respiratory: Negative.    Cardiovascular: Negative.    Gastrointestinal: Negative.    Genitourinary: Negative.    Musculoskeletal: Negative for back pain, falls, joint pain, myalgias and neck pain.   Skin: Negative.    Neurological: Positive for tingling. Negative for dizziness and headaches.        Right groin with sharp shooting pain   Endo/Heme/Allergies: Negative.        Objective:   Visit Vitals   ??? BP 136/86   ??? Pulse 81    ??? Temp 98.2 ??F (36.8 ??C)   ??? Resp 20   ??? Ht 5\' 5"  (1.651 m)   ??? Wt 154 lb (69.9 kg)   ??? SpO2 97%   ??? BMI 25.63 kg/m2      Wt Readings from Last 3 Encounters:   01/08/16 154 lb (69.9 kg)   12/08/15 156 lb (70.8 kg)   11/06/15 153 lb (69.4 kg)     Lab Results   Component Value Date/Time    Glucose 96 10/01/2015 10:05 PM         Physical Exam   Constitutional: He is oriented to person, place, and time. He appears well-developed and well-nourished.   HENT:   Head: Normocephalic.   Eyes: Pupils are equal, round, and reactive to light.   Neck: Normal range of motion. Neck supple. No JVD present. Carotid bruit is not present. No thyromegaly present.   Cardiovascular: Normal rate and regular rhythm.    No murmur heard.  Pulmonary/Chest: Effort normal and breath sounds normal. No respiratory distress. He has no wheezes.   Abdominal: Soft. Bowel sounds are normal. He exhibits no distension. There is no tenderness.   Musculoskeletal: He exhibits no edema.        Right hip: Normal. He exhibits normal range of motion and normal strength.        Lumbar back: He exhibits normal range of motion, no tenderness, no pain and no spasm.        Right foot: Normal.        Left foot: There is decreased range of motion, decreased capillary refill and deformity (Left foot 2nd digit missing, atrophy ). There is no tenderness, no bony tenderness and no swelling.   Neurological: He is alert and oriented to person, place, and time. He has normal reflexes. He displays atrophy (Left leg ). He displays no tremor. A sensory deficit (Left foot with diminished sensory) is  present. No cranial nerve deficit. He exhibits abnormal muscle tone (Left ankle/foot). He displays a negative Romberg sign. He displays no seizure activity. Coordination and gait normal. GCS eye subscore is 4. GCS verbal subscore is 5. GCS motor subscore is 6.   Skin: Skin is warm and dry.   Psychiatric: He has a normal mood and affect. His behavior is normal.  Judgment and thought content normal.   Vitals reviewed.      Radiology Reports:  PVL ARTERIAL EXAM LE8/16/2017  [facility]  Result Narrative   Bilateral: A bilateral lower extremity arterial exam was performed.????Continuous wave Doppler tracings were recorded from the common femoral, popliteal, posterior tibial and dorsalis pedis arteries bilaterally, and resting ankle and brachial pressures were measured.????The bilateral lower extremity digital photoplethysmography pressure and waveform study was performed.  Right: The lower extremity ankle brachial index is 1.18 on the right. The right lower extremity waveforms are triphasic in the femoral, popliteal and dorsalis pedis arteries and absent in the right posterior tibial artery. Digital waveforms are pulsatile with a digit to brachial index greater than 0.60. Digital pressures 2-5 were not obtained due to cuff size.  Left: Ankle pressures appear artificially elevated secondary to poorly compressible vessels. The left lower extremity waveforms are triphasic in the femoral and popliteal arteries and monophasic in the posterior tibial and dorsalis pedis arteries. Digital waveforms in the left (1 and 3-5) digits are dampened with a decreased digit to brachial index of 0.32 in the first digit. Digital pressures 3-5 were not obtained due to cuff size. The 2nd lower extremity digit has been previously amputated.  Conclusions: Left ankle pressures appear artificially elevated secondary to poorly compressible vessels.  Moderate left lower extremity arterial insufficiency at the level of the tibial-peroneal arteries by waveform analysis.  Dampened left lower extremity digital waveforms consistent with known proximal arterial insufficiency with a digital brachial index of 0.32.  Normal right lower extremity arterial examination at rest.  Normal right lower extremity digital waveforms and pressures.     ED PVL VENOUS EXAM LE RIGHT6/14/2017  [facility]   Result Narrative   Right: The deep venous system of the right lower extremity was examined using duplex ultrasound.????B-mode imaging demonstrates normal compressibility of the common femoral, femoral, deep femoral, popliteal, posterior tibial, and peroneal veins. There is no thrombus identified in the lower extremity.????Doppler flow signals are spontaneous and phasic at all levels. There is no evidence of venous reflux in the deep veins or in the great saphenous vein at the saphenofemoral junction. The contralateral common femoral vein is patent with normal hemodynamics.  Conclusions: No evidence of deep venous thrombosis in the right lower extremity.  No evidence of venous reflux.   No significant change from the previous exam on 06/16/13.     LUMBAR SPINE COMPLETE6/12/2015  [facility]  Result Impression   Impression:    1.????Radiographically normal lumbar spine.   Result Narrative   Comparison is made with an MRI of the lumbar spine from 06/14/13.    Indication: Low back and right leg pain.????No history of recent injury.    The bowel gas pattern is nonobstructive.????Sacroiliac joints have a normal appearance.????5 lumbar type vertebral bodies are present without evidence of an acute compression fracture or abnormal subluxation.????The disc spaces are normal in appearance.     X-RAY EXAM HIP UNILAT, 2-3 VIEWS, RT5/31/2017  [facility]  Result Impression   Impression:  1.????No fracture or dislocation of the right hip is present.????If symptoms persist despite conservative  treatment, MRI may be helpful.????   Result Narrative   Indication: PAIN.     Comparison: None available.    Findings:  AP pelvis and frogleg lateral view of the right hip were provided. The bone mineralization is normal.????There is no fracture or dislocation.    Subtle sclerosis along the margin of the anatomic neck is likely within normal limits.     Result Narrative   June Leap, MD???????? 06/17/2013????4:53 PM   Extensive electrodiagnostic examination of the left lower   extremity with a more limited examination of the right lower   extremity and of the left upper extremity reveals evidence of a   generalized sensorimotor peripheral polyneuropathy, axon loss in   type, with active on top of chronic motor axon loss, severe in   degree electrically, extending proximally to the mid-thigh, but   sparing the upper extremities and slightly asymmetrical, left   worse than right.  Due to the extent of the above findings, a superimposed left   lumbosacral radiculopathy affecting the L5 and S1 roots cannot be   excluded, which would be an alternative explanation for the   observed asymmetry.????    Josefine Class Ch??mali, MD  Autonomic and Neuromuscular Neurologist         Assessment:    Seydina Holliman Schill who has risk factors including (see above previous medical hx) and:       ICD-10-CM ICD-9-CM    1. Encounter to establish care Z76.89 V65.8 TSH 3RD GENERATION      VITAMIN D, 25 HYDROXY      DRUG SCREEN, URINE      HEMOGLOBIN A1C WITH EAG      LIPID PANEL      HEPATITIS PANEL, ACUTE      CBC WITH AUTOMATED DIFF      METABOLIC PANEL, COMPREHENSIVE      MAGNESIUM      HIV 1/2 AG/AB, 4TH GENERATION,W RFLX CONFIRM      ETHYL ALCOHOL      URINALYSIS W/ RFLX MICROSCOPIC      VITAMIN B12      T4, FREE   2. Neuropathic pain M79.2 729.2 VITAMIN B12      REFERRAL TO VASCULAR SURGERY   3. PAD (peripheral artery disease) (HCC) I73.9 443.9 REFERRAL TO VASCULAR SURGERY   4. Neuropathy (HCC) G62.9 355.9 REFERRAL TO VASCULAR SURGERY      REFERRAL TO NEUROLOGY      naproxen (NAPROSYN) 500 mg tablet   5. Left leg weakness R29.898 729.89 REFERRAL TO VASCULAR SURGERY      REFERRAL TO NEUROLOGY   6. Sciatica of right side M54.31 724.3 naproxen (NAPROSYN) 500 mg tablet   7. Influenza vaccination declined Z28.21 V64.06      1. Encounter to establish care  - TSH 3RD GENERATION; Future  - VITAMIN D, 25 HYDROXY; Future  - DRUG SCREEN, URINE; Future   - HEMOGLOBIN A1C WITH EAG; Future  - LIPID PANEL; Future  - HEPATITIS PANEL, ACUTE; Future  - CBC WITH AUTOMATED DIFF; Future  - METABOLIC PANEL, COMPREHENSIVE; Future  - MAGNESIUM; Future  - HIV 1/2 AG/AB, 4TH GENERATION,W RFLX CONFIRM; Future  - ETHYL ALCOHOL; Future  - URINALYSIS W/ RFLX MICROSCOPIC; Future  - VITAMIN B12; Future  - T4, FREE; Future    2. Neuropathic pain  - VITAMIN B12; Future  - REFERRAL TO VASCULAR SURGERY    3. PAD (peripheral artery disease) (HCC)  - REFERRAL TO VASCULAR SURGERY    4.  Neuropathy (HCC)  - REFERRAL TO VASCULAR SURGERY  - REFERRAL TO NEUROLOGY  - naproxen (NAPROSYN) 500 mg tablet; Take 1 Tab by mouth two (2) times daily (with meals). Indications: Pain  Dispense: 60 Tab; Refill: 1    5. Left leg weakness    - REFERRAL TO VASCULAR SURGERY  - REFERRAL TO NEUROLOGY    6. Sciatica of right side  - naproxen (NAPROSYN) 500 mg tablet; Take 1 Tab by mouth two (2) times daily (with meals). Indications: Pain  Dispense: 60 Tab; Refill: 1    7. Influenza vaccination declined      Written instructions followed our verbal discussion of all information discussed above, pending tests ordered and future goals/plans. Patient expressed understanding of current diagnosis, planned testing, follow up and if needed to contact the office for any questions or concerns prior to the next visit.     Plan:   Pt with PAD, this may be the major source of his neuropathy considering all other testing besides arterial PVLs where benign. Will check A1C and b12 level as well. Will continue gabapentin and tramadol for pain as this is helpful to the patient. Discussed with patient that long term he may need to go to pain management, however we should focus on underlying problem in the meantime. The patient no longer smokes. Will check lipid panel to see if he has underlying cholesterol problems as well.     Reviewed medication and completed the medication reconciliation with the  patient. Reviewed side effects of medications with the patient. Questions were answered and patient verb understanding.    Pt is a 35 yo AA male. See Med hx for details. Pt in the office today to establish care, medication reconciliation .       Labs obtained to establish baseline, evaluate metabolic health, nutritional status, vitamin deficiencies and screening for at risk items based on the demographics of the patient, previous medical history and current social practices.?? Will contact the patient in when all labs are resulted by phone to review and make lifestyle and medication recommendations. Follow up labs will be completed to monitor improvement prior to their next visit.      Orders Placed This Encounter   ??? TSH 3RD GENERATION     Standing Status:   Future     Standing Expiration Date:   07/09/2016   ??? VITAMIN D, 25 HYDROXY     Standing Status:   Future     Standing Expiration Date:   01/08/2017   ??? DRUG SCREEN, URINE     Standing Status:   Future     Standing Expiration Date:   07/09/2016   ??? HEMOGLOBIN A1C WITH EAG     Standing Status:   Future     Standing Expiration Date:   01/08/2017   ??? LIPID PANEL     Standing Status:   Future     Standing Expiration Date:   01/08/2017   ??? HEPATITIS PANEL, ACUTE     Standing Status:   Future     Standing Expiration Date:   07/09/2016   ??? CBC WITH AUTOMATED DIFF     Standing Status:   Future     Standing Expiration Date:   06/06/2016   ??? METABOLIC PANEL, COMPREHENSIVE     Standing Status:   Future     Standing Expiration Date:   07/09/2016   ??? MAGNESIUM     Standing Status:   Future     Standing Expiration  Date:   07/07/2016   ??? HIV 1/2 AG/AB, 4TH GENERATION,W RFLX CONFIRM     Standing Status:   Future     Standing Expiration Date:   07/09/2016   ??? ETHYL ALCOHOL     Standing Status:   Future     Standing Expiration Date:   07/09/2016   ??? URINALYSIS W/ RFLX MICROSCOPIC     Standing Status:   Future     Standing Expiration Date:   07/07/2016   ??? VITAMIN B12      Standing Status:   Future     Standing Expiration Date:   07/07/2016   ??? T4, FREE     Standing Status:   Future     Standing Expiration Date:   06/06/2016   ??? REFERRAL TO VASCULAR SURGERY     Referral Priority:   Routine     Referral Type:   Consultation     Referral Reason:   Specialty Services Required   ??? REFERRAL TO NEUROLOGY     Referral Priority:   Routine     Referral Type:   Consultation     Referral Reason:   Specialty Services Required   ??? traMADol (ULTRAM) 50 mg tablet     Sig: Take 1 Tab by mouth every six (6) hours as needed for Pain. Max Daily Amount: 200 mg.     Dispense:  20 Tab     Refill:  0   ??? naproxen (NAPROSYN) 500 mg tablet     Sig: Take 1 Tab by mouth two (2) times daily (with meals). Indications: Pain     Dispense:  60 Tab     Refill:  1     Current Outpatient Prescriptions   Medication Sig Dispense Refill   ??? traMADol (ULTRAM) 50 mg tablet Take 1 Tab by mouth every six (6) hours as needed for Pain. Max Daily Amount: 200 mg. 20 Tab 0   ??? naproxen (NAPROSYN) 500 mg tablet Take 1 Tab by mouth two (2) times daily (with meals). Indications: Pain 60 Tab 1   ??? gabapentin (NEURONTIN) 300 mg capsule Take 1 Cap by mouth three (3) times daily. 30 Cap 0       Follow-up Disposition: Not on File  See APA for financial assistance  Labs needed for follow-up appt     "No Show policy was reviewed with the patient. The services affected are the nurse navigator and the provider. No show appointments include missing labs for a future scheduled appointment, Pap/pelvics, arriving to appointment more than 10 minutes late, and calling to cancel appointment less than 24 hours in advance. After the 3rd No Show, the patient will be removed from the Foundation to include medications for 6 months. The patient will be referred to the [facility] for their primary care needs."     Hedda Slade, MSN, RN, FNP-C     [facility]     I spent 35 minutes with the patient in face-to-face consultation, of which greater than 50% was spent in counseling and coordination of care as described above.

## 2016-01-08 NOTE — Addendum Note (Signed)
Addended byHelaine Chess: Nathaniel Yaden on: 01/08/2016 10:01 AM      Modules accepted: Orders

## 2016-01-08 NOTE — Patient Instructions (Signed)
Learning About Peripheral Arterial Disease of the Legs  What is peripheral arterial disease?    Peripheral arterial disease (PAD) is narrowing or blockage of arteries in your arms and legs.  The most common cause of PAD is the buildup of plaque on the inside of arteries. Plaque is made of extra cholesterol, calcium, and other material in your blood. Over time, plaque builds up in the walls of the arteries, including those that supply blood to your legs. This buildup leads to poor blood flow. When you have PAD, you have a risk of having plaque in other arteries in your body. This raises your risk of a heart attack and stroke.  This information focuses on peripheral arterial disease of the legs, the area where it is most common.  When you have PAD of the legs and you walk or exercise, your leg muscles do not get enough blood, and you can get painful cramps. The cramps are called intermittent claudication.  Peripheral arterial disease is also called peripheral vascular disease.  What are the symptoms?  Many people who have PAD do not have any symptoms. But if you have symptoms, you may have a tight, aching, or squeezing pain in the calf, thigh, or buttock. This pain usually happens after you have walked a certain distance. The pain goes away if you stop walking.  As PAD gets worse, you may have pain in your foot or toe when you are not walking. You also may have symptoms that you can see, such as:  ?? Feet and toes that become pale from exercise or when elevated.  ?? Loss of hair on the feet and toes.  ?? Feet that turn red when dangled.  ?? Blue or purple marks on the legs, feet, or toes.  ?? Sores on the feet or toes.  ?? Discolored or black skin on the legs or feet. This is a sign of gangrene (death of tissue).  How can you prevent PAD?  ?? Quit smoking. Quitting smoking is one of the best things you can do to help prevent PAD. If you need help quitting, talk to your doctor about  stop-smoking programs and medicines. These can increase your chances of quitting for good.  ?? Stay at a healthy weight.  ?? Manage other health problems, including diabetes, high blood pressure, and high cholesterol.  ?? Be physically active. Get at least 30 minutes of exercise on most days of the week. Walking is a good choice. You also may want to do other activities, such as running, swimming, cycling, or playing tennis or team sports.  ?? Eat a variety of heart-healthy foods.  ?? Eat fruits, vegetables, whole grains (like oatmeal), dried beans and peas, nuts and seeds, soy products (like tofu), and fat-free or low-fat dairy products.  ?? Replace butter, margarine, and hydrogenated or partially hydrogenated oils with olive and canola oils. (Canola oil margarine without trans fat is fine.)  ?? Replace red meat with fish, poultry, and soy protein (like tofu).  ?? Limit processed and packaged foods like chips, crackers, and cookies.  How is PAD treated?  Your doctor may suggest ways to relieve symptoms and lower your risk of heart attack and stroke. These may include:  ?? Quitting smoking. It's one of the most important things you can do. If you need help quitting, talk to your doctor about stop-smoking programs and medicines. These can increase your chances of quitting for good.  ?? Eating heart-healthy foods.  ?? Staying at a   healthy weight. Lose weight if you need to.  ?? Regular exercise. Your doctor may suggest a program that includes walking and weight training exercises. You might try a supervised program or try it at home. Your goal is to be able to walk farther without pain.  ?? Medicines that help manage other problems such as high blood pressure and high cholesterol.  ?? Medicine, such as aspirin, that prevents blood clots which could cause a heart attack or stroke.  ?? Procedures, such as opening narrowed or blocked arteries (angioplasty) or using healthy blood vessels to create detours around narrowed or  blocked arteries (bypass surgery).  Follow-up care is a key part of your treatment and safety. Be sure to make and go to all appointments, and call your doctor if you are having problems. It's also a good idea to know your test results and keep a list of the medicines you take.  Where can you learn more?  Go to [employer].ca.  Enter [phone] in the search box to learn more about "Learning About Peripheral Arterial Disease of the Legs."  Current as of: September 16, 2014  Content Version: 11.3  ?? 2006-2017 [employer]. Care instructions adapted under license by [employer] (which disclaims liability or warranty for this information). If you have questions about a medical condition or this instruction, always ask your healthcare professional. Health[employer], Incorporated disclaims any warranty or liability for your use of this information.    The Foundation reminders!    Foundation Operating Hours:  These may change without notice.  Mon- Wed 7am to 5pm. Closed for lunch 12-1pm  Thurs 7am to 12pm  Fridays closed     NO SHOW POLICY ~ If a patient has 3 no shows for an appointment with the Provider, Mental Health Provider, or the Nurse Navigator in 6 months, they will be discharged from the practice for 6 months. Medication ordering will also be suspended. If the patient is discharged from the [facility], they can go to the [facility] where they can be seen for the primary needs plus obtain the same types of medications as they receive at the [facility].To avoid being discharged, the patient must call the office at [phone] 24 hours prior to their appointment if they need to cancel, arrive to their appointments on time and come to all scheduled appointments. If the patient is discharged from the practice, they can apply to be re-established after 12 months.     Lab work:  Unless you are instructed differently, please return to the  office between the hours of 7 am and 10:30 am Monday through Thursday to have your labs drawn one week before your next scheduled PROVIDER visit.  If you do not have an appointment to follow up on these results, please make one or plan to call the office if you do not hear from [place] to get the results.  No news does not mean good news.      Medications:  If your medications are new or have changed, and you get your medications from the clinic pharmacy ([facility]), you MUST talk to the pharmacy staff to sign the new prescription applications.  If you don't sign the applications we cannot get the medications for you. It usually takes 6-8 weeks for your medications to arrive.     The Pharmacy staff will call you when your medications are available. You will have 30 days to come in and pick up your medications. If you don't  pick up your medicines within those 30 days, those medicines will be placed on the self as samples and you will have to start all over again by completing the applications and waiting the 6-8 weeks for your medicines to arrive. This is firm and there will be no exceptions!!    The [facility] or [facility] will assist you with your medications if available but not all medications are available so some may be obtained through local pharmacies such as [facility] that has a large $4 list and [facility] that has many drugs for free or also for $4.  We will work hard to get the best medications for you that you can also afford.      [facility] Medication pick-up times:  Monday-Tuesday 1:00 -5:00 PM  Wednesday- Thursday 8:00 -11:30 AM      Feet Care: Local ministry through [facility]  Every second Tuesday of the month (except for holidays and election days) from 9am to 1 pm. The services provided by these ministry volunteers are free of charge with the option to donate. They will inspect your feet thoroughly, soak them for 10 minutes, cut and file your nails. They care  for diabetics as well. Keep in mind this service is free and will be on a first come first serve basis.    Bad teeth?  Ask about the Dental Bus to get you in front of a local dentist. The bus leaves every other Wednesday for those on the list. (Ask about availability as these appointments are limited)    Eye exams for Diabetics.  Please let us know so we can add you to the list to see the eye doctor at [facility]. You will receive a free eye exam and free glasses if needed. Unfortunately, if you are not a diabetic, we do not have a free service for eye exams for you (yet!).  We do have information on where to go to get a huge discount on eye exams and glasses.    Sick visits:  If you are sick and it is not an emergency call the office to see if you can schedule an appointment.     Charges and cost items from the clinic:  Most of our orders are covered by [facility] but there ARE SOME CHARGES for items such as radiology interpretations and anesthesiology during procedures and surgeries.  Please make sure you have contacted the Advanced Patient Advocacy (APA) group to check on your payment options: [email or web address] or come in and talk to them in person: On  Tuesdays, we have Advanced Patient Advocacy is available Mon - Fri 8-4pm at [facility] on the first floor by the information desk. Their number is [phone]. It is important that you are screened in order to qualify for assistance and to avoid huge medical charges. The Foundation is not responsible for ANY charges you may accrue regardless of who ordered the medication, procedure, treatment or test. If you go to the Emergency Room, you WILL be charged!    Behavior and emotional issues!  It is stressful to be sick, have an illness, take medications, not have a job, not have medical insurance, have family issues or just getting older!  Schedule an appointment with our mental health provider. She is in the office Mondays and Wednesdays  from 8am to 4pm.      You can also contact the following:    The national suicide hotline (1-800-273-TALK or 2708162360)  [facility]  [address]  [place], [place]   [phone]    Walk- in hours Monday -Wednesday   8:30 am -11:15 AM  2:15pm -4:15 pm    [facility] [place]  [address]   [place], [place] [id number]  [phone]        The [place] Department for Aging and [employer], in collaboration with community partners, provides and advocates for resources and services to improve the employment, quality of life, security, and independence of older [place], [place] with disabilities, and their families.    Department for Aging and Rehabilitative Services  [address] Location: [address]  [place], [place] [id number]   Voice: [phone]  Toll Free Number:[phone]  Toll Free TTY: [phone]  E-mail: [email or web address]      Immunizations/Vaccination  Routine Immunizations are provided for infants, children, teens, and adults at the [facility]. Please bring all immunization records with you and present them at the time of registration.  Phone [phone] extension [phone]  Hours (Walk in)  Monday, Tuesday, Wednesday and Friday  8:00 AM to 11:00 AM  12:30 PM to 3:00 PM    Want to Quit Smoking???  Continued tobacco use increases your risks of elevated blood pressure, vascular irritation with increased incidence of cardiovascular with stroke, heart attack, and/or peripheral vascular disease causing claudication. Smoking may also cause lung damage that could lead to COPD, cancer, and death.  We encourage you to find a few healthy habits, write them down then plan to decrease your cigarette use by one each week till they are gone all together. Plan for ways to cope with stress for stress may cause you to want to restart. It is imperative to develop new coping mechanisms in order to be successful.     1-800-QUIT-NOW provided for counseling        Drug and Alcohol Addiction Issues! It is hard to stop a poor habit but there is help out there. Please feel free to attend any other the following support groups to help you kick the habit or go to [facility] to be evaluated by the psychiatric team. Never give up!!    AlAnon meetings: [place], [place], [place]    [place] MONDAY 7:30 PM JUST FOR TODAY [employer] Al-[employer]on [facility] [address]     [place] WEDNESDAY 10:30 AM NEW [place]GINN[place] [employer] Al-Anon [employer], RO[address]     [place] WEDNESDAY 8:00 PM [place] [employer] Al-[facility] [address]     [place] THURSDAY 8:00 PM KEEP IT SIMPLE [employer] Al-Anon [facility] [address]     [place] THURSDAY 8:00 PM LET IT BEGIN WITH ME [employer] Al-[facility]on [facility] [address]     [place] FRIDAY 6;30 PM [place] PARENTS [employer] Parents [facility] [address]     [place] MONDAY 10:30 AM [place] [employer] Al-[facility]on [facility] [address]     [place] SATURDAY 8:00 PM [address] SATURDAY NIGHT [employer] Al-[facility]on [facility] [address]     [place] MONDAY 7:00 PM MONDAY NIGHT [employer] Al-[facility]on [facility] [address]     [place] WEDNESDAY 8:00 PM [employer] Al-[facility]on [facility] [address]

## 2016-01-08 NOTE — Progress Notes (Signed)
Unable to reach patient. Letter sent with results and request to call me when he gets letter so we may discuss.

## 2016-01-08 NOTE — Progress Notes (Signed)
LM with church member to call me.

## 2016-01-09 NOTE — Progress Notes (Signed)
Messages left at both numbers on medical record to call me.

## 2016-01-22 ENCOUNTER — Encounter

## 2016-01-22 MED ORDER — METFORMIN SR 500 MG 24 HR TABLET
500 mg | ORAL_TABLET | Freq: Every day | ORAL | 3 refills | Status: DC
Start: 2016-01-22 — End: 2016-01-22

## 2016-01-22 MED ORDER — METFORMIN SR 500 MG 24 HR TABLET
500 mg | ORAL_TABLET | Freq: Every day | ORAL | 3 refills | Status: DC
Start: 2016-01-22 — End: 2016-03-08

## 2016-01-22 MED ORDER — TRAMADOL 50 MG TAB
50 mg | ORAL_TABLET | Freq: Four times a day (QID) | ORAL | 0 refills | Status: DC | PRN
Start: 2016-01-22 — End: 2016-01-29

## 2016-01-22 NOTE — Telephone Encounter (Signed)
Pt requesting refill of tramadol until he sees Vascular surgery and neurology. The patient is pending pain management as well. Will only give 1 more refill. UDS has been consistent, PMP reviewed.

## 2016-01-22 NOTE — Progress Notes (Signed)
Patient has returned call and he is given lab results at this time. [facility] [address]  He is also requesting a refill of his Ultram. He does not see neuro for 2 weeks and pain management until Novmember.

## 2016-01-22 NOTE — Addendum Note (Signed)
Addended by: Hedda SladeSPALDING, Jabin Tapp D on: 01/22/2016 04:30 PM      Modules accepted: Orders

## 2016-01-29 ENCOUNTER — Ambulatory Visit
Admit: 2016-01-29 | Discharge: 2016-01-29 | Payer: PRIVATE HEALTH INSURANCE | Attending: Sleep Medicine | Primary: Family

## 2016-01-29 DIAGNOSIS — M792 Neuralgia and neuritis, unspecified: Secondary | ICD-10-CM

## 2016-01-29 MED ORDER — GABAPENTIN 300 MG CAP
300 mg | ORAL_CAPSULE | Freq: Three times a day (TID) | ORAL | 1 refills | Status: DC
Start: 2016-01-29 — End: 2016-03-08

## 2016-01-29 MED ORDER — TRAMADOL 50 MG TAB
50 mg | ORAL_TABLET | Freq: Three times a day (TID) | ORAL | 1 refills | Status: DC | PRN
Start: 2016-01-29 — End: 2016-02-11

## 2016-01-29 MED ORDER — PREGABALIN 100 MG CAP
100 mg | ORAL_CAPSULE | ORAL | 1 refills | Status: DC
Start: 2016-01-29 — End: 2016-02-04

## 2016-01-29 NOTE — Patient Instructions (Signed)
Take gapapentin until able to obtain Lyrica  Do not take both at same time

## 2016-01-29 NOTE — Progress Notes (Signed)
[facility]             [address], [address]               [place], [place]  [id number]    01/29/2016    HPI:  William BangBrent L Griffin is a 35 y.o., right handed,   male, who presents with neuropathic pain.  Patient reports she had sudden onset of leg pain and weakness on the left side approximately 2015 he [facility] including EMG nerve conduction studies demonstrating neuropathy.  He reports being on both Ultram and Neurontin together for adequate pain control whereas Neurontin alone is ineffective.  Pain ranges from 3-8 the left leg primarily now is that sharp shooting pain from the knees down.  He also has occasional shooting pains on the right side.  He does not recall other medicines tried other than the amitriptyline and Neurontin.    Current Outpatient Prescriptions   Medication Sig Dispense Refill   ??? traMADol (ULTRAM) 50 mg tablet Take 1 Tab by mouth every eight (8) hours as needed for Pain. Max Daily Amount: 150 mg. 90 Tab 1   ??? gabapentin (NEURONTIN) 300 mg capsule Take 1 Cap by mouth three (3) times daily. 90 Cap 1   ??? pregabalin (LYRICA) 100 mg capsule 1 cap twice a day 60 Cap 1   ??? metFORMIN ER (GLUCOPHAGE XR) 500 mg tablet Take 1 Tab by mouth daily (with dinner). Indications: PREVENTION OF TYPE 2 DIABETES MELLITUS 30 Tab 3   ??? naproxen (NAPROSYN) 500 mg tablet Take 1 Tab by mouth two (2) times daily (with meals). Indications: Pain 60 Tab 1       Past Medical History:   Diagnosis Date   ??? Asthma    ??? Chronic pain    ??? 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 Neuropathy    ??? Pre-diabetes 01/08/2016    A1C 6.2   ??? Sciatica    ??? Vitamin D deficiency        Past Surgical History:   Procedure Laterality Date   ??? 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 HX ORTHOPAEDIC       Family History   Problem Relation Age of Onset   ??? Hypertension Mother    ??? Stroke Mother    ??? Hypertension Father    ??? Asthma Sister       No Known Allergies    Review of Systems:   Review of Systems - History obtained from the patient  General ROS: negative  Psychological ROS: negative  ENT ROS: negative  Hematological and Lymphatic ROS: negative  Endocrine ROS: negative  Respiratory ROS: no cough, shortness of breath, or wheezing  Cardiovascular ROS: no chest pain or dyspnea on exertion  Gastrointestinal ROS: no abdominal pain, change in bowel habits, or black or bloody stools  Genito-Urinary ROS: no dysuria, trouble voiding, or hematuria  Musculoskeletal ROS: positive for - gait disturbance, joint pain, muscle pain, muscular weakness and pain in leg - bilateral  Neurological ROS: positive for - gait disturbance, numbness/tingling and weakness  Dermatological ROS: negative    PHYSICAL EXAMINATION:    Visit Vitals   ??? BP 128/88 (BP 1 Location: Left arm, BP Patient Position: Sitting)   ??? Pulse 74   ??? Temp 98.5 ??F (36.9 ??C) (Oral)   ??? Resp 12   ??? Ht 5\' 5"  (1.651  m)   ??? Wt 73 kg (161 lb)   ??? SpO2 98%   ??? BMI 26.79 kg/m2     General:  Well defined, nourished, and groomed individual in no acute distress.    Neck: Supple, nontender, thyroid within normal limits, no JVD, no bruits, no pain with resistance to active range of motion.    Heart: Regular rate and rhythm, no murmurs, rub, or gallop.  Normal S1S2.  Lungs:  Clear to auscultation bilaterally with equal chest expansion, no cough, no wheeze  Musculoskeletal:  Extremities revealed no edema and had full range of motion of joints.    Psych:  Good mood and normal affect    NEUROLOGICAL EXAMINATION:     Mental Status:   Alert and oriented to person, place, and time with recent and remote memory intact.  Attention span and concentration are normal. Speech is fluent with a full fund of knowledge.      Cranial Nerves:    II, III, IV, VI:  Visual acuity grossly intact. Visual fields are normal.    Pupils are equal, round, and reactive to light and accommodation.     Extra-ocular movements are full and fluid.  Fundoscopic exam was benign, no ptosis or nystagmus.   V-XII: Hearing is grossly intact.  Facial features are symmetric, with normal sensation and strength.  The palate rises symmetrically and the tongue protrudes midline.  Sternocleidomastoids 5/5.      Motor Examination: Normal tone, bulk, and strength, 5/5 muscle strength throughout bue, decreased strength 4/5 worse distally lle 5-/5 rle.  No cogwheel rigidity or clonus present.      Sensory exam:  Normal throughout to pinprick, temperature, and vibration sense except decreased pinprick lle stocking distribution.  Normal proprioception.      Coordination:  Heel-to-shin wassymmetrical bilaterally.  Finger to nose and rapid arm movement testing was normal.   No resting or intention tremor    Gait and Station:  Antalgic  while walking,unable to walk  on toes, heels, and with tandem walking.  Normal arm swing.  No Romberg or pronator drift.   No muscle wasting or fasiculations noted.      Reflexes:  DTRs 2+ throughout except absent lle.  Toes downgoing.      Assessment and Plan:   [name] is a 35 y.o. left handed male whose history and physical are consistent with neuropathic pain.  Robbie Lis Levan who has risk factors including history of generalized neuropathy     Diagnoses and all orders for this visit:    1. Neuropathic pain  -     traMADol (ULTRAM) 50 mg tablet; Take 1 Tab by mouth every eight (8) hours as needed for Pain. Max Daily Amount: 150 mg.    2. Sciatica of left side    3. Peripheral vascular disease (HCC)    4. Encounter to establish care  -     traMADol (ULTRAM) 50 mg tablet; Take 1 Tab by mouth every eight (8) hours as needed for Pain. Max Daily Amount: 150 mg.    5. PAD (peripheral artery disease) (HCC)  -     traMADol (ULTRAM) 50 mg tablet; Take 1 Tab by mouth every eight (8) hours as needed for Pain. Max Daily Amount: 150 mg.    6. Neuropathy   -     traMADol (ULTRAM) 50 mg tablet; Take 1 Tab by mouth every eight (8) hours as needed for Pain. Max Daily Amount: 150 mg.  7. Left leg weakness  -     traMADol (ULTRAM) 50 mg tablet; Take 1 Tab by mouth every eight (8) hours as needed for Pain. Max Daily Amount: 150 mg.    8. Sciatica of right side  -     traMADol (ULTRAM) 50 mg tablet; Take 1 Tab by mouth every eight (8) hours as needed for Pain. Max Daily Amount: 150 mg.    Other orders  -     gabapentin (NEURONTIN) 300 mg capsule; Take 1 Cap by mouth three (3) times daily.  -     pregabalin (LYRICA) 100 mg capsule; 1 cap twice a day      Follow-up Disposition:  Return in about 1 month (around 02/29/2016).   Reviewed notes in chart,need for vit b12 supplementation,risks and benefits of medication    I spent 60 minutes with the patient in face-to-face consultation, of which greater than 50% was spent in counseling and coordination of care as described above.

## 2016-02-04 ENCOUNTER — Ambulatory Visit: Admit: 2016-02-04 | Payer: PRIVATE HEALTH INSURANCE | Attending: Family | Primary: Family

## 2016-02-04 DIAGNOSIS — I739 Peripheral vascular disease, unspecified: Secondary | ICD-10-CM

## 2016-02-04 MED ORDER — CYANOCOBALAMIN 500 MCG TAB
500 mcg | ORAL_TABLET | Freq: Every day | ORAL | 11 refills | Status: DC
Start: 2016-02-04 — End: 2016-05-08

## 2016-02-04 MED ORDER — PREGABALIN 100 MG CAP
100 mg | ORAL_CAPSULE | Freq: Two times a day (BID) | ORAL | 1 refills | Status: DC
Start: 2016-02-04 — End: 2016-05-06

## 2016-02-04 NOTE — Progress Notes (Signed)
Chief Complaint   Patient presents with   ??? Follow Up Chronic Condition      1 month follow-up. Patient states has not filled his medications.     HISTORY OF PRESENT ILLNESS  William Griffin is a 35 y.o. male with pmhx of PAD, New dx Pre-Diabtic, vascular insufficiency s/p toe amputation, and chronic neuropathic pain her for new patient f/u visit. The patient was refererred to Neurology and Vascular surgery at last visit. He was seen by Dr. Garner NashMorewitz with neurology and was switched to Lyrica via patient assistance program as a replacement to the gabapentin. He has not picked up his tramadol yet due to him not working and having steady income, but states he will be picking it up later. He reports continued left leg sharp shooting pains that is controlled by duel therapy of ultram and neurontin. He is pending an appt with vascular surgery 10/25.   HPI  Patient Active Problem List    Diagnosis Date Noted   ??? Influenza vaccination declined 01/08/2016   ??? Pre-diabetes 01/08/2016   ??? Peripheral vascular disease (HCC) 08/20/2015   ??? History of amputation of lesser toe of left foot (HCC) 08/20/2015   ??? Neuropathic pain 08/20/2015   ??? Elevated BP without diagnosis of hypertension 08/20/2015   ??? Therapeutic drug monitoring 02/06/2014   ??? Sciatica 04/04/2013     Current Outpatient Prescriptions   Medication Sig Dispense Refill   ??? cyanocobalamin (VITAMIN B12) 500 mcg tablet Take 1 Tab by mouth daily. 30 Tab 11   ??? pregabalin (LYRICA) 100 mg capsule Take 1 Cap by mouth two (2) times a day. Max Daily Amount: 200 mg. Indications: neuropathic pain 60 Cap 1   ??? naproxen (NAPROSYN) 500 mg tablet Take 1 Tab by mouth two (2) times daily (with meals). Indications: Pain 60 Tab 1   ??? traMADol (ULTRAM) 50 mg tablet Take 1 Tab by mouth every eight (8) hours as needed for Pain. Max Daily Amount: 150 mg. 90 Tab 1   ??? gabapentin (NEURONTIN) 300 mg capsule Take 1 Cap by mouth three (3) times daily. 90 Cap 1    ??? metFORMIN ER (GLUCOPHAGE XR) 500 mg tablet Take 1 Tab by mouth daily (with dinner). Indications: PREVENTION OF TYPE 2 DIABETES MELLITUS 30 Tab 3     No Known Allergies  Past Medical History:   Diagnosis Date   ??? Asthma    ??? Chronic pain    ??? Insufficiency, arterial, peripheral (HCC) 11/28/2015    [facility]   ??? Neuropathy    ??? Pre-diabetes 01/08/2016    A1C 6.2   ??? Sciatica    ??? Vitamin D deficiency      Past Surgical History:   Procedure Laterality Date   ??? 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 HX ORTHOPAEDIC       Family History   Problem Relation Age of Onset   ??? Hypertension Mother    ??? Stroke Mother    ??? Hypertension Father    ??? Asthma Sister      Social History   Substance Use Topics   ??? Smoking status: Former Smoker     Packs/day: 0.50     Types: Cigarettes   ??? Smokeless tobacco: Never Used      Comment: quit about 5 months ago   ??? Alcohol use No      Comment: occasional "pt stated that he stop drinking"  Review of Systems   Neurological: Positive for tingling (Left leg pain ).       Physical Exam   Constitutional: He appears well-developed and well-nourished. No distress.   HENT:   Head: Normocephalic.   Eyes: Pupils are equal, round, and reactive to light.   Cardiovascular: Normal rate.    Pulmonary/Chest: Effort normal.   Musculoskeletal:   Left leg muscle atrophy    Vitals reviewed.      ASSESSMENT and PLAN    ICD-10-CM ICD-9-CM    1. Peripheral vascular disease (HCC) I73.9 443.9    2. Neuropathic pain M79.2 729.2    3. Pre-diabetes R73.03 790.29      Diagnoses and all orders for this visit:    1. Peripheral vascular disease (HCC)    2. Neuropathic pain    3. Pre-diabetes    Other orders  -     cyanocobalamin (VITAMIN B12) 500 mcg tablet; Take 1 Tab by mouth daily.  -     pregabalin (LYRICA) 100 mg capsule; Take 1 Cap by mouth two (2) times a day. Max Daily Amount: 200 mg. Indications: neuropathic pain    Continue current plan. Advised the patient to start B12 supplements as  well.  F/u with Vascular surgery 10/25    Follow-up Disposition:  Return in about 3 months (around 05/06/2016), or if symptoms worsen or fail to improve.  reviewed medications and side effects in detail

## 2016-02-04 NOTE — Patient Instructions (Signed)
Pregabalin (By mouth)   Pregabalin (pre-GA-ba-lin)  Treats nerve and muscle pain, including fibromyalgia. Also treats seizures.   Brand Name(s): Lyrica   There may be other brand names for this medicine.  When This Medicine Should Not Be Used:   This medicine is not right for everyone. Do not use it if you had an allergic reaction to pregabalin.  How to Use This Medicine:   Capsule, Liquid  ?? Take your medicine as directed. Your dose may need to be changed several times to find what works best for you.  ?? Measure the oral liquid medicine with a marked measuring spoon, oral syringe, or medicine cup.  ?? This medicine should come with a Medication Guide. Ask your pharmacist for a copy if you do not have one.  ?? Missed dose: Take a dose as soon as you remember. If it is almost time for your next dose, wait until then and take a regular dose. Do not take extra medicine to make up for a missed dose.  ?? Store the medicine in a closed container at room temperature, away from heat, moisture, and direct light.  Drugs and Foods to Avoid:   Ask your doctor or pharmacist before using any other medicine, including over-the-counter medicines, vitamins, and herbal products.  ?? Some medicines can affect how pregabalin works. Tell your doctor if you are using any of the following:   ?? Diabetes medicine that you take by mouth (pioglitazone, rosiglitazone)  ?? An ACE inhibitor (benazepril, enalapril, lisinopril, quinapril, ramipril)  ?? Tell your doctor if you use anything else that makes you sleepy. Some examples are allergy medicine, narcotic pain medicine, and alcohol.  Warnings While Using This Medicine:   ?? Tell your doctor if you are pregnant or breastfeeding, or if you have kidney disease, heart failure, heart rhythm problems, angioedema, a bleeding disorder, or a low blood platelet count. Tell your doctor if you have a history of alcohol or drug abuse, depression, or other mood problems.   ?? This medicine may cause the following problems:   ?? Serious allergic reactions  ?? Suicidal thoughts  ?? This medicine may make you dizzy or drowsy. It may also cause blurry or double vision. Do not drive or do anything that could be dangerous until you know how this medicine affects you.  ?? Do not stop using this medicine suddenly. Your doctor will need to slowly decrease your dose before you stop it completely.  ?? Your doctor will check your progress and the effects of this medicine at regular visits. Keep all appointments.  ?? Keep all medicine out of the reach of children. Never share your medicine with anyone.  Possible Side Effects While Using This Medicine:   Call your doctor right away if you notice any of these side effects:  ?? Allergic reaction: Itching or hives, swelling in your face or hands, swelling or tingling in your mouth or throat, chest tightness, trouble breathing  ?? Blistering, peeling, red skin rash  ?? Blurry or double vision  ?? Fever, chills, cough, sore throat, and body aches  ?? Muscle pain, tenderness, or weakness, fever, or general ill feeling  ?? Rapid weight gain, swelling in your hands, ankles, or feet  ?? Severe dizziness or drowsiness  ?? Sudden mood changes, unusual thoughts or behavior such as extreme happiness or depression  ?? Suicidal thoughts  ?? Swelling in your throat, head, or neck  ?? Uneven heartbeat  ?? Unusual bleeding, bruising, or weakness    If you notice these less serious side effects, talk with your doctor:   ?? Confusion, trouble concentrating  ?? Constipation  ?? Dry mouth  If you notice other side effects that you think are caused by this medicine, tell your doctor.   Call your doctor for medical advice about side effects. You may report side effects to FDA at 1-800-FDA-1088  ?? 2017 [employer] Information is for End User's use only and may not be sold, redistributed or otherwise used for commercial purposes.   The above information is an educational aid only. It is not intended as medical advice for individual conditions or treatments. Talk to your doctor, nurse or pharmacist before following any medical regimen to see if it is safe and effective for you.

## 2016-02-06 ENCOUNTER — Ambulatory Visit
Admit: 2016-02-06 | Discharge: 2016-02-06 | Payer: PRIVATE HEALTH INSURANCE | Attending: Physician Assistant | Primary: Family

## 2016-02-06 DIAGNOSIS — I70212 Atherosclerosis of native arteries of extremities with intermittent claudication, left leg: Secondary | ICD-10-CM

## 2016-02-06 NOTE — Progress Notes (Signed)
William Griffin    Chief Complaint   Patient presents with   ??? New Patient   ??? Leg Pain       HPI    William Griffin is a 35 y.o. male who presents to the office today with complaint of left leg and foot pain.  He states that for about the past 3 years he has been suffering from neuropathy in his left leg.  He also states that he had left foot drop and had to ambulate with a brace.  He states that about a year ago his foot drop seem to be getting better and he stopped wearing the brace.  He states that now however his foot pain is returning.  He states that he feels he is unable to pick up his foot to walk properly.  He also reports pain with standing for any period of time.  He has history of left second toe amputation from a toe infection.  He states that he was recently diagnosed with prediabetes.  He did also have arterial studies done which showed moderate left lower extremity arterial insufficiency at the level of the tibial-peroneal arteries by waveform analysis. Dampened left lower extremity digital waveforms consistent with known proximal arterial insufficiency with a digital brachial index of 0.32. Normal right lower extremity arterial examination at rest. Normal right lower extremity digital waveforms and pressures.  He also had a CTA scan of the left leg in February of this year which showed internal and external iliac arteries as well as the common femoral arteries are normal in size and patent.  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  The left posterior tibial artery is occluded shortly after takeoff but reconstitutes by the peroneal artery at the level of the ankle.  The anterior tibial artery as well as the dorsalis pedis and distal posterior tibial artery are patent.  On the right posterior tibial and peroneal arteries are occluded shortly after takeoff.  The anterior tibial artery on the right is patent to the level  of the foot and gives rise to patent dorsalis pedis artery.  Imaging is unable to be reviewed as I only have the written report of his CTA.  He denies any skin changes or further tissue loss.  He does not describe any rest pain.  He is a former smoker.    Past Medical History:   Diagnosis Date   ??? Asthma    ??? Chronic pain    ??? Insufficiency, arterial, peripheral (HCC) 11/28/2015    [facility]   ??? Neuropathy    ??? Pre-diabetes 01/08/2016    A1C 6.2   ??? Sciatica    ??? Vitamin D deficiency      Patient Active Problem List   Diagnosis Code   ??? Sciatica M54.30   ??? Therapeutic drug monitoring Z51.81   ??? Peripheral vascular disease (HCC) I73.9   ??? 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 Neuropathic pain M79.2   ??? Elevated BP without diagnosis of hypertension R03.0   ??? Influenza vaccination declined Z28.21   ??? Pre-diabetes R73.03     Past Surgical History:   Procedure Laterality Date   ??? 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 HX ORTHOPAEDIC       Current Outpatient Prescriptions   Medication Sig Dispense Refill   ??? cyanocobalamin (VITAMIN B12) 500 mcg tablet Take 1  Tab by mouth daily. 30 Tab 11   ??? pregabalin (LYRICA) 100 mg capsule Take 1 Cap by mouth two (2) times a day. Max Daily Amount: 200 mg. Indications: neuropathic pain 60 Cap 1   ??? traMADol (ULTRAM) 50 mg tablet Take 1 Tab by mouth every eight (8) hours as needed for Pain. Max Daily Amount: 150 mg. 90 Tab 1   ??? gabapentin (NEURONTIN) 300 mg capsule Take 1 Cap by mouth three (3) times daily. 90 Cap 1   ??? metFORMIN ER (GLUCOPHAGE XR) 500 mg tablet Take 1 Tab by mouth daily (with dinner). Indications: PREVENTION OF TYPE 2 DIABETES MELLITUS 30 Tab 3   ??? naproxen (NAPROSYN) 500 mg tablet Take 1 Tab by mouth two (2) times daily (with meals). Indications: Pain 60 Tab 1     No Known Allergies  Social History     Social History   ??? Marital status: SINGLE     Spouse name: N/A   ??? Number of children: N/A   ??? 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 Smoking status: Former Smoker     Packs/day: 0.50     Types: Cigarettes   ??? Smokeless tobacco: Never Used      Comment: quit about 5 months ago   ??? Alcohol use No      Comment: occasional "pt stated that he stop drinking"   ??? Drug use: No   ??? Sexual activity: Not on file     Other Topics Concern   ??? 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 Blood Transfusions No   ??? Caffeine Concern No   ??? Occupational Exposure No   ??? Hobby Hazards No   ??? Sleep Concern No   ??? Stress Concern No   ??? Weight Concern No   ??? Special Diet No   ??? Back Care No   ??? Exercise Yes   ??? Bike Helmet No   ??? Seat Belt Yes     Social History Narrative      Family History   Problem Relation Age of Onset   ??? Hypertension Mother    ??? Stroke Mother    ??? Hypertension Father    ??? Asthma Sister        Review of Systems    Constitutional: negative   HEENT: negative   Respiratory: negative   Cardiovascular: negative   Gastrointestinal: negative   Genitourinary:negative   Hematologic/lymphatic: negative   Musculoskeletal: Positive for left leg and foot pain  Neurological: Positive for left leg neuropathy and foot drop  Behavioral/Psych: negative   Endocrine: negative   Allergic/Immunologic: negative      Physical Exam:    Visit Vitals   ??? BP 130/82 (BP 1 Location: Left arm, BP Patient Position: Sitting)   ??? Pulse 74   ??? Resp 22   ??? Ht 5\' 5"  (1.651 m)   ??? Wt 157 lb (71.2 kg)   ??? BMI 26.13 kg/m2      General: Well-appearing male in no acute distress   HEENT: EOMI, no scleral icterus is noted.   Cardiovascular: RRR  Pulmonary: No increased work or breathing is noted.   Abdomen:  nondistended.   Extremities: Warm and well perfused bilaterally. Pt has no BLE edema noted.  He is status post amputation of left second toe.  He has palpable  pedal pulses and Doppler signals are at least biphasic throughout.  There is no tissue loss or ulcers.  Neuro: Cranial nerves II through XII are grossly intact    Integument: No ulcerations are identified visibly      Impression and Plan:  William Griffin is a 35 y.o. male with left leg and foot pain.  He has a long history of neuropathy of unknown etiology in his left leg as well as foot drop.  He does also have evidence of peripheral arterial disease as described above.  His imaging was reviewed with him in the office today.  Discussed that although he does have tibial disease I am not convinced that this is the cause of his leg pain.  Discussed that I would recommend he get back in his foot brace and will refer him to podiatry for further evaluation.  I did discuss possible intervention with angiogram but discussed that the likelihood of finding something treatable that will resolve his leg pain is quite low.  Would recommend evaluation by podiatry and a trial back in his brace prior to undergoing any type of invasive intervention for his peripheral arterial disease.  He does follow with neurology for his neuropathy treatment.  Patient expresses understanding and agrees to this plan.  Will follow-up after podiatry evaluation for further surgical discussion. Plan was discussed. Patient expresses understanding and agrees.    William Griffin, [place]        PLEASE NOTE:  This document has been produced using Chemical engineer. Unrecognized errors in transcription may be present.

## 2016-02-11 ENCOUNTER — Inpatient Hospital Stay
Admit: 2016-02-11 | Discharge: 2016-02-11 | Disposition: A | Discharge status: Home / Self Care | Payer: Self-pay | Attending: Emergency Medicine

## 2016-02-11 DIAGNOSIS — M5441 Lumbago with sciatica, right side: Secondary | ICD-10-CM

## 2016-02-11 MED ORDER — CYCLOBENZAPRINE 10 MG TAB
10 mg | ORAL_TABLET | Freq: Three times a day (TID) | ORAL | 0 refills | Status: DC | PRN
Start: 2016-02-11 — End: 2016-03-08

## 2016-02-11 MED ORDER — IBUPROFEN 600 MG TAB
600 mg | ORAL_TABLET | Freq: Four times a day (QID) | ORAL | 0 refills | Status: DC | PRN
Start: 2016-02-11 — End: 2016-03-05

## 2016-02-11 MED ORDER — CYCLOBENZAPRINE 10 MG TAB
10 mg | ORAL | Status: AC
Start: 2016-02-11 — End: 2016-02-11
  Administered 2016-02-11: 14:00:00 via ORAL

## 2016-02-11 MED ORDER — IBUPROFEN 600 MG TAB
600 mg | ORAL | Status: AC
Start: 2016-02-11 — End: 2016-02-11
  Administered 2016-02-11: 14:00:00 via ORAL

## 2016-02-11 MED FILL — CYCLOBENZAPRINE 10 MG TAB: 10 mg | ORAL | Qty: 1

## 2016-02-11 MED FILL — IBUPROFEN 600 MG TAB: 600 mg | ORAL | Qty: 1

## 2016-02-11 NOTE — ED Provider Notes (Addendum)
HPI Comments: 35 yr old male, hx asthma, sciatica, neuropathy, and vitamin D deficiency presents to the ED complaining of increased sciatic nerve pain from the R low back down the R leg after doing a lot of walking over the past few days. 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 Pt states this has worsened his sciatica. Pt denies numbness/tingling, injury, and loss of bladder/bowel control. Pt states in the past he has taken motrin and flexeril which alleviated his sciatic pain, but states he is out of this medication. Pt denies any hx of IV drug use. No other complaints.     Patient is a 35 y.o. male presenting with leg pain.   Leg Pain    Associated symptoms include back pain. Pertinent negatives include no neck pain.        Past Medical History:   Diagnosis Date   ??? Asthma    ??? Chronic pain    ??? Insufficiency, arterial, peripheral (HCC) 11/28/2015    [facility]   ??? Neuropathy    ??? Pre-diabetes 01/08/2016    A1C 6.2   ??? Sciatica    ??? Vitamin D deficiency        Past Surgical History:   Procedure Laterality Date   ??? 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 HX ORTHOPAEDIC           Family History:   Problem Relation Age of Onset   ??? Hypertension Mother    ??? Stroke Mother    ??? Hypertension Father    ??? Asthma Sister        Social History     Social History   ??? Marital status: SINGLE     Spouse name: N/A   ??? Number of children: N/A   ??? 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 Smoking status: Former Smoker     Packs/day: 0.50     Types: Cigarettes   ??? Smokeless tobacco: Never Used      Comment: quit about 5 months ago   ??? Alcohol use No      Comment: occasional "pt stated that he stop drinking"   ??? Drug use: No   ??? Sexual activity: Not on file     Other Topics Concern   ??? Financial plannerMilitary Service Yes     [employer] 6 years   ??? Blood Transfusions No   ??? Caffeine Concern No    ??? Occupational Exposure No   ??? Hobby Hazards No   ??? Sleep Concern No   ??? Stress Concern No   ??? Weight Concern No   ??? Special Diet No   ??? Back Care No   ??? Exercise Yes   ??? Bike Helmet No   ??? Seat Belt Yes     Social History Narrative         ALLERGIES: Review of patient's allergies indicates no known allergies.    Review of Systems   Constitutional: Negative.  Negative for chills, diaphoresis, fatigue and fever.   HENT: Negative.  Negative for congestion, ear pain, rhinorrhea and sore throat.    Eyes: Negative.  Negative for pain and redness.   Respiratory: Negative.  Negative for cough, shortness of breath, wheezing and stridor.    Cardiovascular: Negative.  Negative  for chest pain, palpitations and leg swelling.   Gastrointestinal: Negative.  Negative for abdominal pain, constipation, diarrhea, nausea and vomiting.   Endocrine: Negative.    Genitourinary: Negative.  Negative for dysuria, flank pain, frequency and hematuria.   Musculoskeletal: Positive for back pain. Negative for myalgias, neck pain and neck stiffness.        Sciatica    Skin: Negative.  Negative for rash and wound.   Allergic/Immunologic: Negative.    Neurological: Negative.  Negative for dizziness, seizures, syncope and headaches.   Hematological: Negative.    Psychiatric/Behavioral: Negative.    All other systems reviewed and are negative.      Vitals:    02/11/16 0820   BP: (!) 157/100   Pulse: 79   Resp: 14   Temp: 97.9 ??F (36.6 ??C)   SpO2: 97%   Weight: 68 kg (150 lb)            Physical Exam   Constitutional: He is oriented to person, place, and time. He appears well-developed and well-nourished. No distress.   HENT:   Head: Normocephalic.   Neck: Normal range of motion. Neck supple.   Cardiovascular: Normal rate, regular rhythm and normal heart sounds.  Exam reveals no gallop and no friction rub.    No murmur heard.  Pulmonary/Chest: Effort normal and breath sounds normal. No stridor. No  respiratory distress. He has no wheezes. He has no rales.   Musculoskeletal: Normal range of motion. He exhibits tenderness. He exhibits no edema or deformity.   TTP noted to the R lumbar erector spinae, radicular pain into the R leg noted on deep palpation, no midline TTP noted along the spine, ROM full and intact. Good sensation/strength.    Neurological: He is alert and oriented to person, place, and time. Coordination normal.   Gait is steady. Able to ambulate without difficulty.     Skin: Skin is warm and dry. No rash noted. He is not diaphoretic. No erythema.   Psychiatric: He has a normal mood and affect. His behavior is normal. Thought content normal.   Nursing note and vitals reviewed.       MDM  Number of Diagnoses or Management Options  Acute right-sided low back pain with right-sided sciatica:   Diagnosis management comments: Impression:  Low back pain with sciatica, elevated blood pressure     Patient is stable for discharge at this time. Rx for flexeril/motrin given. Rest and follow-up with PCP this week. Return to the ED immediately for any new or worsening sx.  Theodore Rahrig J Shealyn Sean, PA-C 9:41 AM     Risk of Complications, Morbidity, and/or Mortality  Presenting problems: low  Diagnostic procedures: low  Management options: low    Patient Progress  Patient progress: stable    ED Course       Procedures

## 2016-02-11 NOTE — ED Triage Notes (Signed)
"  I have nerve pain in my right leg. It came really tough on Friday. 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 I told them I can't be walking around on my leg but I don't think they understand. The muscle in my leg is really tight and I can barely walk or stand up."

## 2016-02-26 NOTE — Telephone Encounter (Signed)
DONE

## 2016-02-26 NOTE — Telephone Encounter (Signed)
Spoke with an associate at [facility]. Patient gave his Tramadol RX to one [facility] and wants it filled at another one (it's closer to home). Due to transportation issues, he is unable to go to the other [facility] to get the paper script. [facility] has sent, via fax, a form for refilling controlled substances to us. Requesting that Dr. Garner NashMorewitz send in new RX to new [facility]. Form placed in Dr. Jess BartersMorewitz's folder for review.

## 2016-03-05 ENCOUNTER — Emergency Department: Admit: 2016-03-05 | Payer: Self-pay | Primary: Family

## 2016-03-05 ENCOUNTER — Inpatient Hospital Stay
Admit: 2016-03-05 | Discharge: 2016-03-08 | Disposition: A | Discharge status: Home Health | Payer: Self-pay | Attending: Emergency Medicine | Admitting: Emergency Medicine

## 2016-03-05 DIAGNOSIS — I214 Non-ST elevation (NSTEMI) myocardial infarction: Principal | ICD-10-CM

## 2016-03-05 LAB — ECHOCARDIOGRAM COMPLETE 2D W DOPPLER W COLOR: Left Ventricular Ejection Fraction: 60

## 2016-03-05 LAB — EKG 12-LEAD
Atrial Rate: 72 {beats}/min
Atrial Rate: 72 {beats}/min
Atrial Rate: 77 {beats}/min
Atrial Rate: 77 {beats}/min
Diagnosis: NORMAL
Diagnosis: NORMAL
Diagnosis: NORMAL
P Axis: 56 degrees
P Axis: 56 degrees
P Axis: 57 degrees
P Axis: 57 degrees
P-R Interval: 140 ms
P-R Interval: 140 ms
P-R Interval: 142 ms
P-R Interval: 144 ms
Q-T Interval: 352 ms
Q-T Interval: 364 ms
Q-T Interval: 368 ms
Q-T Interval: 386 ms
QRS Duration: 82 ms
QRS Duration: 88 ms
QRS Duration: 90 ms
QRS Duration: 90 ms
QTc Calculation (Bazett): 385 ms
QTc Calculation (Bazett): 411 ms
QTc Calculation (Bazett): 416 ms
QTc Calculation (Bazett): 422 ms
R Axis: 37 degrees
R Axis: 53 degrees
R Axis: 59 degrees
R Axis: 59 degrees
T Axis: 37 degrees
T Axis: 57 degrees
T Axis: 58 degrees
T Axis: 59 degrees
Ventricular Rate: 72 {beats}/min
Ventricular Rate: 72 {beats}/min
Ventricular Rate: 77 {beats}/min
Ventricular Rate: 77 {beats}/min

## 2016-03-05 LAB — D-DIMER, QUANTITATIVE: D-Dimer, Quant: 0.91 ug/ml(FEU) — ABNORMAL HIGH (ref <0.46)

## 2016-03-05 LAB — METABOLIC PANEL, COMPREHENSIVE
A-G Ratio: 0.7 — ABNORMAL LOW (ref 0.8–1.7)
ALT (SGPT): 30 U/L (ref 16–61)
AST (SGOT): 17 U/L (ref 15–37)
Albumin: 3.5 g/dL (ref 3.4–5.0)
Alk. phosphatase: 96 U/L (ref 45–117)
Anion gap: 4 mmol/L (ref 3.0–18)
BUN/Creatinine ratio: 8 — ABNORMAL LOW (ref 12–20)
BUN: 7 MG/DL (ref 7.0–18)
Bilirubin, total: 0.3 MG/DL (ref 0.2–1.0)
CO2: 30 mmol/L (ref 21–32)
Calcium: 9.1 MG/DL (ref 8.5–10.1)
Chloride: 101 mmol/L (ref 100–108)
Creatinine: 0.88 MG/DL (ref 0.6–1.3)
GFR est AA: >60 mL/min/{1.73_m2} (ref >60)
GFR est non-AA: >60 mL/min/{1.73_m2} (ref >60)
Globulin: 5.3 g/dL — ABNORMAL HIGH (ref 2.0–4.0)
Glucose: 94 mg/dL (ref 74–99)
Potassium: 4.3 mmol/L (ref 3.5–5.5)
Protein, total: 8.8 g/dL — ABNORMAL HIGH (ref 6.4–8.2)
Sodium: 135 mmol/L — ABNORMAL LOW (ref 136–145)

## 2016-03-05 LAB — CBC WITH AUTOMATED DIFF
ABS. BASOPHILS: 0 10*3/uL (ref 0.0–0.06)
ABS. EOSINOPHILS: 0.1 10*3/uL (ref 0.0–0.4)
ABS. LYMPHOCYTES: 2.5 10*3/uL (ref 0.9–3.6)
ABS. MONOCYTES: 0.6 10*3/uL (ref 0.05–1.2)
ABS. NEUTROPHILS: 1.9 10*3/uL (ref 1.8–8.0)
BASOPHILS: 0 % (ref 0–2)
EOSINOPHILS: 2 % (ref 0–5)
HCT: 39.2 % (ref 36.0–48.0)
HGB: 13.2 g/dL (ref 13.0–16.0)
LYMPHOCYTES: 48 % (ref 21–52)
MCH: 27.9 PG (ref 24.0–34.0)
MCHC: 33.7 g/dL (ref 31.0–37.0)
MCV: 82.9 FL (ref 74.0–97.0)
MONOCYTES: 12 % — ABNORMAL HIGH (ref 3–10)
MPV: 10.4 FL (ref 9.2–11.8)
NEUTROPHILS: 38 % — ABNORMAL LOW (ref 40–73)
PLATELET: 242 10*3/uL (ref 135–420)
RBC: 4.73 M/uL (ref 4.70–5.50)
RDW: 13.9 % (ref 11.6–14.5)
WBC: 5 10*3/uL (ref 4.6–13.2)

## 2016-03-05 LAB — CARDIAC PANEL,(CK, CKMB & TROPONIN)
CK - MB: 1.6 ng/ml (ref <3.6)
CK - MB: 1.8 ng/ml (ref <3.6)
CK - MB: 12.5 ng/ml — ABNORMAL HIGH (ref <3.6)
CK-MB Index: 1.1 % (ref 0.0–4.0)
CK-MB Index: 1.1 % (ref 0.0–4.0)
CK-MB Index: 3.8 % (ref 0.0–4.0)
CK: 148 U/L (ref 39–308)
CK: 168 U/L (ref 39–308)
CK: 326 U/L — ABNORMAL HIGH (ref 39–308)
Troponin-I, QT: 0.49 NG/ML — ABNORMAL HIGH (ref 0.0–0.045)
Troponin-I, QT: 0.5 NG/ML — ABNORMAL HIGH (ref 0.0–0.045)
Troponin-I, QT: 2.48 NG/ML — CR (ref 0.0–0.045)

## 2016-03-05 LAB — EKG, 12 LEAD, INITIAL
Atrial Rate: 72 {beats}/min
Atrial Rate: 77 {beats}/min
Calculated P Axis: 56 degrees
Calculated P Axis: 57 degrees
Calculated R Axis: 37 degrees
Calculated R Axis: 59 degrees
Calculated T Axis: 37 degrees
Calculated T Axis: 58 degrees
Diagnosis: NORMAL
Diagnosis: NORMAL
P-R Interval: 140 ms
P-R Interval: 144 ms
Q-T Interval: 352 ms
Q-T Interval: 368 ms
QRS Duration: 88 ms
QRS Duration: 90 ms
QTC Calculation (Bezet): 385 ms
QTC Calculation (Bezet): 416 ms
Ventricular Rate: 72 {beats}/min
Ventricular Rate: 77 {beats}/min

## 2016-03-05 LAB — EKG, 12 LEAD, SUBSEQUENT
Atrial Rate: 72 {beats}/min
Atrial Rate: 77 {beats}/min
Calculated P Axis: 56 degrees
Calculated P Axis: 57 degrees
Calculated R Axis: 53 degrees
Calculated R Axis: 59 degrees
Calculated T Axis: 57 degrees
Calculated T Axis: 59 degrees
Diagnosis: NORMAL
P-R Interval: 140 ms
P-R Interval: 142 ms
Q-T Interval: 364 ms
Q-T Interval: 386 ms
QRS Duration: 82 ms
QRS Duration: 90 ms
QTC Calculation (Bezet): 411 ms
QTC Calculation (Bezet): 422 ms
Ventricular Rate: 72 {beats}/min
Ventricular Rate: 77 {beats}/min

## 2016-03-05 LAB — PROTHROMBIN TIME + INR
INR: 0.9 (ref 0.8–1.2)
Prothrombin time: 12 s (ref 11.5–15.2)

## 2016-03-05 LAB — D DIMER: D DIMER: 0.91 ug/ml(FEU) — ABNORMAL HIGH (ref <0.46)

## 2016-03-05 LAB — SED RATE (ESR): Sed rate, automated: 28 mm/hr — ABNORMAL HIGH (ref 0–15)

## 2016-03-05 LAB — PTT: aPTT: 31.5 s (ref 23.0–36.4)

## 2016-03-05 MED ORDER — METOPROLOL TARTRATE 5 MG/5 ML IV SOLN
5 mg/ mL | INTRAVENOUS | Status: AC
Start: 2016-03-05 — End: 2016-03-05
  Administered 2016-03-06: via INTRAVENOUS

## 2016-03-05 MED ORDER — HYDROMORPHONE 1 MG/ML (1 ML) IN 0.9 % NACL IV SYRINGE
1 mg/mL ( mL) | INTRAVENOUS | Status: AC
Start: 2016-03-05 — End: 2016-03-05
  Administered 2016-03-05: via INTRAVENOUS

## 2016-03-05 MED ORDER — MORPHINE 4 MG/ML SYRINGE
4 mg/mL | INTRAMUSCULAR | Status: AC
Start: 2016-03-05 — End: 2016-03-05
  Administered 2016-03-05: 18:00:00 via INTRAVENOUS

## 2016-03-05 MED ORDER — HEPARIN (PORCINE) IN D5W 25,000 UNIT/250 ML IV
25000 unit/250 mL(100 unit/mL) | INTRAVENOUS | Status: AC
Start: 2016-03-05 — End: 2016-03-06
  Administered 2016-03-05 – 2016-03-06 (×3): via INTRAVENOUS

## 2016-03-05 MED ORDER — ESMOLOL IN SALINE (ISO-OSMOTIC) 10 MG/ML IV
2500 mg/250 mL (10 mg/mL) | INTRAVENOUS | Status: DC
Start: 2016-03-05 — End: 2016-03-05
  Administered 2016-03-06 (×2): via INTRAVENOUS

## 2016-03-05 MED ORDER — NITROGLYCERIN 0.4 MG SUBLINGUAL TAB
0.4 mg | SUBLINGUAL | Status: AC
Start: 2016-03-05 — End: 2016-03-05
  Administered 2016-03-05: 17:00:00 via SUBLINGUAL

## 2016-03-05 MED ORDER — ASPIRIN 325 MG TAB
325 mg | ORAL | Status: AC
Start: 2016-03-05 — End: 2016-03-05
  Administered 2016-03-05: 17:00:00 via ORAL

## 2016-03-05 MED ORDER — NITROGLYCERIN IN D5W 100 MG/250 ML (0.4 MG/ML) IV
100 mg/250 mL (400 mcg/mL) | INTRAVENOUS | Status: DC
Start: 2016-03-05 — End: 2016-03-07
  Administered 2016-03-05 – 2016-03-07 (×7): via INTRAVENOUS

## 2016-03-05 MED ORDER — HEPARIN (PORCINE) 1,000 UNIT/ML IJ SOLN
1000 unit/mL | Freq: Once | INTRAMUSCULAR | Status: DC
Start: 2016-03-05 — End: 2016-03-05
  Administered 2016-03-05: 17:00:00 via INTRAVENOUS

## 2016-03-05 MED ORDER — MORPHINE 4 MG/ML SYRINGE
4 mg/mL | INTRAMUSCULAR | Status: AC
Start: 2016-03-05 — End: 2016-03-05
  Administered 2016-03-05: 22:00:00 via INTRAVENOUS

## 2016-03-05 MED ORDER — HEPARIN (PORCINE) IN D5W 25,000 UNIT/250 ML IV
25000 unit/250 mL(100 unit/mL) | INTRAVENOUS | Status: DC
Start: 2016-03-05 — End: 2016-03-05

## 2016-03-05 MED ORDER — IOPAMIDOL 61 % IV SOLN
300 mg iodine /mL (61 %) | Freq: Once | INTRAVENOUS | Status: AC
Start: 2016-03-05 — End: 2016-03-05
  Administered 2016-03-05: 21:00:00 via INTRAVENOUS

## 2016-03-05 MED ORDER — HEPARIN (PORCINE) 1,000 UNIT/ML IJ SOLN
1000 unit/mL | Freq: Once | INTRAMUSCULAR | Status: AC
Start: 2016-03-05 — End: 2016-03-05
  Administered 2016-03-05: 18:00:00 via INTRAVENOUS

## 2016-03-05 MED FILL — ISOVUE-300  61 % INTRAVENOUS SOLUTION: 300 mg iodine /mL (61 %) | INTRAVENOUS | Qty: 100

## 2016-03-05 MED FILL — NITROGLYCERIN IN D5W 100 MG/250 ML (0.4 MG/ML) IV: 100 mg/250 mL (400 mcg/mL) | INTRAVENOUS | Qty: 250

## 2016-03-05 MED FILL — HEPARIN (PORCINE) 1,000 UNIT/ML IJ SOLN: 1000 unit/mL | INTRAMUSCULAR | Qty: 10

## 2016-03-05 MED FILL — ASPIRIN 325 MG TAB: 325 mg | ORAL | Qty: 1

## 2016-03-05 MED FILL — MORPHINE 4 MG/ML SYRINGE: 4 mg/mL | INTRAMUSCULAR | Qty: 1

## 2016-03-05 MED FILL — HYDROMORPHONE 1 MG/ML (1 ML) IN 0.9 % NACL IV SYRINGE: 1 mg/mL ( mL) | INTRAVENOUS | Qty: 1

## 2016-03-05 MED FILL — HEPARIN (PORCINE) IN D5W 25,000 UNIT/250 ML IV: 25000 unit/250 mL(100 unit/mL) | INTRAVENOUS | Qty: 250

## 2016-03-05 MED FILL — NITROSTAT 0.4 MG SUBLINGUAL TABLET: 0.4 mg | SUBLINGUAL | Qty: 1

## 2016-03-05 NOTE — Other (Signed)
TRANSFER - IN REPORT:    Verbal report received from ED RN (name) on William Griffin  being received from ED (unit) for routine progression of care      Report consisted of patient???s Situation, Background, Assessment and   Recommendations(SBAR).     Information from the following report(s) SBAR, Kardex, Procedure Summary and MAR was reviewed with the receiving nurse.    Opportunity for questions and clarification was provided.      Assessment completed upon patient???s arrival to unit and care assumed.

## 2016-03-05 NOTE — ED Notes (Signed)
Pt has complaints of chest pain and headache. This nurse states will increase pts nitro drip pt states ' No dont touch it, I feel like since I been here I been feeling worst, I got this headache man" Pt refuses increase in nitro drip, Dr Charlett NoseStull made aware of pts c/o pain verbal orders received. Pt remains stable

## 2016-03-05 NOTE — ED Notes (Signed)
7:00 PM :Pt care assumed from Dr. Charlett NoseStull , ED provider. Pt complaint(s), current treatment plan, progression and available diagnostic results have been discussed thoroughly.  Rounding occurred: yes  Intended Disposition: TBD   Pending diagnostic reports and/or labs (please list):     7:38 PM  Re-eval: Pt continues to have pain, will adjust medications.    Consult:  Discussed care with Dr patel, Specialty: Cardiology  Standard discussion; including history of patient???s chief complaint, available diagnostic results, and treatment course. Requested activation of cath lab will see patient.  7:53 PM, 03/05/2016       Scribe Attestation     Ann HeldJason A Ashford acting as a Neurosurgeonscribe for and in the presence of Charleston RopesVictor Daisy Lites, MD      March 05, 2016 at 7:00 PM       Provider Attestation:      I personally performed the services described in the documentation, reviewed the documentation, as recorded by the scribe in my presence, and it accurately and completely records my words and actions. March 05, 2016 at 7:00 PM - Charleston RopesVictor Jermall Isaacson, MD

## 2016-03-05 NOTE — ED Triage Notes (Signed)
Patient complains of chest pain and left arm pain that started this morning. Patient denies any cardiac hx at this time.

## 2016-03-05 NOTE — ED Provider Notes (Addendum)
HPI Comments: 11:36 AM  Is a 35 year old male with a past medical history significant for chronic pain and neuropathy prediabetes asthma and sciatica who presents with a chest pain.  Patient states his left arm and left jaw no nausea, vomiting or diaphoresis.  No history of similar.   No agg/all factors. Not worse with movement . Patient has no significant family history for coronary disease. Social history significant for history of smoking. No significant surgical history.  No other aggravating or alleviating factors. No other associated symptoms.  This was created with voice recognition software and transcription errors may be present.     Patient is a 35 y.o. male presenting with chest pain and arm pain.   Chest Pain (Angina)    Pertinent negatives include no diaphoresis.   Arm Pain           Past Medical History:   Diagnosis Date   ??? Asthma    ??? Chronic pain    ??? 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 Neuropathy    ??? Pre-diabetes 01/08/2016    A1C 6.2   ??? Sciatica    ??? Vitamin D deficiency        Past Surgical History:   Procedure Laterality Date   ??? 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 HX ORTHOPAEDIC           Family History:   Problem Relation Age of Onset   ??? Hypertension Mother    ??? Stroke Mother    ??? Hypertension Father    ??? Asthma Sister        Social History     Social History   ??? Marital status: SINGLE     Spouse name: N/A   ??? Number of children: N/A   ??? 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 Smoking status: Former Smoker     Packs/day: 0.50     Types: Cigarettes   ??? Smokeless tobacco: Never Used      Comment: quit about 5 months ago   ??? Alcohol use No      Comment: occasional "pt stated that he stop drinking"   ??? Drug use: No   ??? Sexual activity: Not on file     Other Topics Concern   ??? Financial plannerMilitary Service Yes     [employer] 6 years   ??? Blood Transfusions No   ??? Caffeine Concern No    ??? Occupational Exposure No   ??? Hobby Hazards No   ??? Sleep Concern No   ??? Stress Concern No   ??? Weight Concern No   ??? Special Diet No   ??? Back Care No   ??? Exercise Yes   ??? Bike Helmet No   ??? Seat Belt Yes     Social History Narrative         ALLERGIES: Review of patient's allergies indicates no known allergies.    Review of Systems   Constitutional: Negative for diaphoresis.   Cardiovascular: Positive for chest pain.   All other systems reviewed and are negative.      Vitals:    03/05/16 1055   BP: (!) 187/133   Pulse: 74   Resp: 18   Temp: 98.1 ??F (36.7 ??C)   SpO2: 100%   Weight: 68 kg (150 lb)  Physical Exam   Constitutional: He is oriented to person, place, and time. He appears well-developed.   HENT:   Head: Normocephalic and atraumatic.   Eyes: EOM are normal. Pupils are equal, round, and reactive to light.   Neck: Normal range of motion. Neck supple.   Cardiovascular: Normal rate and regular rhythm.  Exam reveals no friction rub.    No murmur heard.  Pulmonary/Chest: Effort normal and breath sounds normal. No respiratory distress. He has no wheezes.   Abdominal: Soft. He exhibits no distension. There is no tenderness. There is no rebound and no guarding.   Musculoskeletal: Normal range of motion.   Neurological: He is alert and oriented to person, place, and time.   Skin: Skin is warm and dry.   Psychiatric: He has a normal mood and affect. His behavior is normal. Thought content normal.        MDM  Number of Diagnoses or Management Options  Diagnosis management comments: BP (!) 187/133   Pulse 74   Temp 98.1 ??F (36.7 ??C)   Resp 18   Wt 68 kg (150 lb)   SpO2 100%   BMI 24.96 kg/m2   Vitals signs, past medical history, social history, and family history reviewed.        EKG shows a normal sinus rhythm at a rate of 72 with normal axis normal intervals and no ST elevation or depression.  EKG #2 shows EKG with a rate of 77, normal axis and intervals no ST  elevation question slight  Depression in the inferior leads.  Also potentially early repolarization based on a notching of the J point lateral leads.      #1 chest pain no significant family history patient is noted to be hypertensive.  Presently having pain will initiate nitroglycerin and aspirin and perform serial EKGs    11:47 AM pain started around 9a; worsened 1 hour PTA. Trop noted.     12:59 PM Still in pain; cardilogy consulted PA Barco.   1:27 PM EKG #4 no change ; cardiology bedside, will get echo. Trop no change. #2.     6:05 PM pain fairly constant cta/pe study neg.   ekg #5 no change.     Pt with ongoing cp, trop elevatin and MB elevation.  Neg PE study; I think pt need to go to cath. Will d/w cardiolgy     6:54 PM d/w cardiolgy rec HR of 55; bp 110 systolic; increase ntg.    D;w PA Marchelle FolksAmanda who spoke with Dr Allena KatzPatel. Recommends max medical therapy to see if pt can become pain free.     7p turned over to dr Bertram Galaanglin; recheck pain 30 min; if still in pain; d/w Dr Eliane DecreeS. Patel who will also recheck in 30 min. +/- cath .    ED Course       Procedures

## 2016-03-05 NOTE — ED Notes (Signed)
Pt reports increased chest pain,  Pt provided ordered medication

## 2016-03-05 NOTE — Procedures (Signed)
cardiac cath report.      PreOpDiagnosis:   Acute coronary syndrome, NSTEMI  HTN    Findings/PostOp Diagnosis:  1. Distal 100% occlusion of tortuous OM2 branch, medical management    Plan:  1. ASA, Plavix, IV heparin , BB and statin    Procedures:  * LHC   * LV angiography  * Selective Bilateral Coronary Angiography  * Conscious sedation using versad and fentanyl    Procedure detail:  Risks, benefits, and alternatives were explained to the patient and appropriate informed consent was obtained prior to the procedure. The patient was brought to the cath lab and she was prepped and draped in the usual sterile manner.?? Moderate sedation was achieved with the appropriate medications. Lidocaine was used to secure local anesthesia.   The right radial artery was cannulated using a modified Seldinger technique and a 5 French sheath was placed.??  LHC was performed using 7F tiger catheter  All catheter was exchanged and advance over 0.035 inch J tip guide wire.  No immediate complication noted.  No major hamatoma or bleeding immediately.  Closure: TR band  Estimated Blood Loss: Minimal  Specimens: None  Assistants: Per [facility]  Complications: None    Conscious sedation : Using versad 1 mg and Fentanyl 25 mcg. Total time ~ 30 mins    LVGram:    EF:  55% without significant wall motion abnormalities    Mitral Regurgitation: No significant    No significant gradient across the aortic valve    LVEDP ~ 12 mm hg    Coronary angiography:  Dominance: Left  LM: Angiographically normal  LAD: Minimal 10% luminal otherwise normal, Large vessel wraps around apex  Diagonal: Large and no obvious obstructive disease  LCX: Large, dominant. Mid 30% plaque  OM1: large, normal  OM2: Medium caliber, tortuous vessel, distal 100% occlusion ( ~ 1.75/2.0 mm vessel at that level)  OM3 and LPDA: Normal  RCA: Small, non-dominant, no obstructive disease  NSTEMI from OM2 distal occlusion but plan is to continue medical  management. Because of tortuosity and distal vessel being no more than 2 mm vessel, plan to continue medical management. Risk/benefit ratio favour medical management in my opinion.       Tretha SciaraSaumil Tomi Paddock V, MD  03/05/2016, 9:44 PM

## 2016-03-05 NOTE — Consults (Addendum)
[facility] - Consult Note    Consultation request by No admitting provider for patient encounter. for advice/opinion related to evaluating There are no admission diagnoses documented for this encounter.     CP    Date of  Admission: 03/05/2016 11:08 AM   Primary Care Physician:  Manfred ShirtsKristel D Spalding, NP  Patient seen and examined independently. CP unrelenting for several hours with only minimal troponin elevation and unremarkable ECG. Personally reviewed echocardiographic images which demonstrate full movement and contraction of all walls without a significant pericardial effusion. There is a suggestion of a pleural effusion although not on CXR. Will continue anticoagulation and trend out cardiac enzymes. Agree with assessment and plan as noted below. Dondra PraderW Krishan Mcbreen MD   Assessment:     Patient Active Problem List   Diagnosis Code   ??? Sciatica M54.30   ??? Therapeutic drug monitoring Z51.81   ??? Peripheral vascular disease (HCC) I73.9   ??? 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 Neuropathic pain M79.2   ??? Elevated BP without diagnosis of hypertension R03.0   ??? Influenza vaccination declined Z28.21   ??? Pre-diabetes R73.03       -Chest pain, atypical constant since 9 AM. Troponin remains indeterminate despite 4-5 hours of chest pain. ECG likely repolarization abnormalities.  -PAD, moderate left leg, recently evaluated by vascular team, no plans for angiography at this time.  -Pre-diabetes, not on medications, although metformin on his home medication list.  -Chronic pain with h/o mainly left leg neuropathy with foot drop.  -H/o elevated blood pressure, not yet started on medications, elevated on admission.  -H/o borderline cholesterol, not yet started on medications.  -H/o left second toe amputation following infection several months ago.  -H/o tobacco use.    No previous cardiologist. [facility]       Plan:     Will get stat bedside echo.   Will repeat troponin and ECG.  Will continue to titrate up nitroglycerine drip.  Continue heparin infusion.  Recommend empiric ASA, statin, BB.  Will need ischemic evaluation pending course, will make further recommendations pending symptom response and echocardiogram results.     History of Present Illness:     This is a 35 y.o. male admitted for There are no admission diagnoses documented for this encounter..      Patient complains of:  CP. Patient is a 35 year old male with history of borderline HTN/DM/LIPIDS (not on medications) and PAD, remote tobacco abuse. [facility] Denies previous cardiology evaluation. Patient reports yesterday morning he had episode of chest fullness/pressure, felt like gas and went away after taking tramadol. Patient reports this morning around 9 am he developed chest pressure again with radiation to left arm. He denies any alleviating or exacerbating factors. However he reports it feels a little better when lying flat on exam. He has been started on heparin and nitro infusion.      Cardiac risk factors:  H/o borderline HTN/lipids/DM    Review of Symptoms:   Constitutional: negative for fevers  Eyes: negative for visual disturbance  Ears, nose, mouth, throat, and face: negative  Respiratory: negative for cough  Cardiovascular: positive for chest pain  Gastrointestinal: negative for vomiting  Genitourinary:negative for dysuria  Hematologic/lymphatic: negative for bleeding  Musculoskeletal:negative for muscle weakness  Neurological: negative for dizziness     Past Medical History:     Past Medical History:   Diagnosis Date   ???  Asthma    ??? Chronic pain    ??? 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 Neuropathy    ??? Pre-diabetes 01/08/2016    A1C 6.2   ??? Sciatica    ??? Vitamin D deficiency          Social History:     Social History     Social History   ??? Marital status: SINGLE     Spouse name: N/A   ??? Number of children: N/A    ??? 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 Smoking status: Former Smoker     Packs/day: 0.50     Types: Cigarettes   ??? Smokeless tobacco: Never Used      Comment: quit about 5 months ago   ??? Alcohol use No      Comment: occasional "pt stated that he stop drinking"   ??? Drug use: No   ??? Sexual activity: Not on file     Other Topics Concern   ??? Financial plannerMilitary Service Yes     [employer] 6 years   ??? Blood Transfusions No   ??? Caffeine Concern No   ??? Occupational Exposure No   ??? Hobby Hazards No   ??? Sleep Concern No   ??? Stress Concern No   ??? Weight Concern No   ??? Special Diet No   ??? Back Care No   ??? Exercise Yes   ??? Bike Helmet No   ??? Seat Belt Yes     Social History Narrative        Family History:     Family History   Problem Relation Age of Onset   ??? Hypertension Mother    ??? Stroke Mother    ??? Hypertension Father    ??? Asthma Sister         Medications:   No Known Allergies     Current Facility-Administered Medications   Medication Dose Route Frequency   ??? nitroglycerin (TRIDIL) 400 mcg/ml infusion  0-200 mcg/min IntraVENous TITRATE   ??? heparin 25,000 units in D5W 250 ml infusion  14-36 Units/kg/hr IntraVENous TITRATE     Current Outpatient Prescriptions   Medication Sig   ??? cyclobenzaprine (FLEXERIL) 10 mg tablet Take 1 Tab by mouth three (3) times daily as needed for Muscle Spasm(s).   ??? ibuprofen (MOTRIN) 600 mg tablet Take 1 Tab by mouth every six (6) hours as needed for Pain.   ??? cyanocobalamin (VITAMIN B12) 500 mcg tablet Take 1 Tab by mouth daily.   ??? pregabalin (LYRICA) 100 mg capsule Take 1 Cap by mouth two (2) times a day. Max Daily Amount: 200 mg. Indications: neuropathic pain   ??? gabapentin (NEURONTIN) 300 mg capsule Take 1 Cap by mouth three (3) times daily.   ??? metFORMIN ER (GLUCOPHAGE XR) 500 mg tablet Take 1 Tab by mouth daily (with dinner). Indications: PREVENTION OF TYPE 2 DIABETES MELLITUS         Physical Exam:     Visit Vitals   ??? BP (!) 149/101    ??? Pulse 84   ??? Temp 98.1 ??F (36.7 ??C)   ??? Resp 14   ??? Wt 68 kg (150 lb)   ??? SpO2 100%   ??? BMI 24.96 kg/m2     BP Readings from Last 3 Encounters:   03/05/16 Marland Kitchen(!)  149/101   02/11/16 (!) 157/100   02/06/16 130/82     Pulse Readings from Last 3 Encounters:   03/05/16 84   02/11/16 79   02/06/16 74     Wt Readings from Last 3 Encounters:   03/05/16 68 kg (150 lb)   02/11/16 68 kg (150 lb)   02/06/16 71.2 kg (157 lb)       General:  alert, cooperative, no distress, appears stated age  Neck:  no JVD  Lungs:  clear to auscultation bilaterally  Heart:  regular rate and rhythm, S1, S2 normal, no murmur, click, rub or gallop  Abdomen:  abdomen is soft without significant tenderness, masses, organomegaly or guarding  Extremities:  extremities normal, atraumatic, no cyanosis or edema  Skin: Warm and dry. no hyperpigmentation, vitiligo, or suspicious lesions  Neuro: alert, oriented x3, affect appropriate  Psych: non focal     Data Review:     Recent Labs      03/05/16   1102   WBC  5.0   HGB  13.2   HCT  39.2   PLT  242     Recent Labs      03/05/16   1102   NA  135*   K  4.3   CL  101   CO2  30   GLU  94   BUN  7   CREA  0.88   CA  9.1   ALB  3.5   SGOT  17   ALT  30   INR  0.9       Results for orders placed or performed during the hospital encounter of 03/05/16   EKG, 12 LEAD, INITIAL   Result Value Ref Range    Ventricular Rate 77 BPM    Atrial Rate 77 BPM    P-R Interval 140 ms    QRS Duration 90 ms    Q-T Interval 368 ms    QTC Calculation (Bezet) 416 ms    Calculated P Axis 57 degrees    Calculated R Axis 59 degrees    Calculated T Axis 58 degrees    Diagnosis       Normal sinus rhythm with sinus arrhythmia  Early repolarization  Normal ECG  When compared with ECG of 05-Mar-2016 10:58,  No significant change was found         All Cardiac Markers in the last 24 hours:    Lab Results   Component Value Date/Time    CPK 168 03/05/2016 12:35 PM    CPK 148 03/05/2016 11:02 AM    CKMB 1.8 03/05/2016 12:35 PM     CKMB 1.6 03/05/2016 11:02 AM    CKND1 1.1 03/05/2016 12:35 PM    CKND1 1.1 03/05/2016 11:02 AM    TROIQ 0.50 (H) 03/05/2016 12:35 PM    TROIQ 0.49 (H) 03/05/2016 11:02 AM       Last Lipid:    Lab Results   Component Value Date/Time    Cholesterol, total 162 01/08/2016 07:59 AM    HDL Cholesterol 39 01/08/2016 07:59 AM    LDL, calculated 109.4 01/08/2016 07:59 AM    Triglyceride 68 01/08/2016 07:59 AM    CHOL/HDL Ratio 4.2 01/08/2016 07:59 AM       Signed By: Tiney Rouge, PA     March 05, 2016

## 2016-03-05 NOTE — Other (Signed)
TRANSFER - OUT REPORT:    Verbal report given to Cathy, RN(name) on [facility]  being transferred to CVT stepdown(unit) for routine post - op       Report consisted of patient???s Situation, Background, Assessment and   Recommendations(SBAR).     Information from the following report(s) SBAR, Procedure Summary, Intake/Output, MAR and Recent Results was reviewed with the receiving nurse.    Lines:   Peripheral IV 03/05/16 Left Hand (Active)   Site Assessment Clean, dry, & intact 03/05/2016 11:56 AM   Phlebitis Assessment 0 03/05/2016 11:56 AM   Infiltration Assessment 0 03/05/2016 11:56 AM   Dressing Status Clean, dry, & intact 03/05/2016 11:56 AM       Peripheral IV 03/05/16 Right Hand (Active)   Site Assessment Clean, dry, & intact 03/05/2016 12:35 PM   Phlebitis Assessment 0 03/05/2016 12:35 PM   Infiltration Assessment 0 03/05/2016 12:35 PM   Dressing Status Clean, dry, & intact 03/05/2016 12:35 PM   Dressing Type Tape;Transparent 03/05/2016 12:35 PM   Hub Color/Line Status Pink;Flushed;Patent 03/05/2016 12:35 PM       Peripheral IV 03/05/16 Right;Upper Arm (Active)   Site Assessment Clean, dry, & intact 03/05/2016  3:45 PM   Phlebitis Assessment 0 03/05/2016  3:45 PM   Infiltration Assessment 0 03/05/2016  3:45 PM   Dressing Status Clean, dry, & intact 03/05/2016  3:45 PM   Dressing Type Tape;Transparent 03/05/2016  3:45 PM   Hub Color/Line Status Pink;Flushed;Patent 03/05/2016  3:45 PM       Peripheral IV 03/05/16 Antecubital (Active)       Peripheral IV 03/05/16 Left Arm (Active)   Site Assessment Clean, dry, & intact 03/05/2016  7:55 PM   Phlebitis Assessment 0 03/05/2016  7:55 PM   Infiltration Assessment 0 03/05/2016  7:55 PM   Dressing Status Clean, dry, & intact 03/05/2016  7:55 PM   Dressing Type Tape;Transparent 03/05/2016  7:55 PM   Hub Color/Line Status Pink;Flushed;Patent 03/05/2016  7:55 PM        Opportunity for questions and clarification was provided.      Patient transported with:   [employer]

## 2016-03-05 NOTE — Consults (Addendum)
Came to see patient again as patient continues to have pain  Patient seen earlier by Dr.Callaghan for chest pain but troponin was 0.5 despite hours of pain and normal ECHO '  Patient continues to have CP despite IV NTG started by ED  Tried to controll BP with IV BB and IV NTG but pain persistent  All EKGs, ECHO reviewed.  Labs and enzymes reviewed  Patient examined  BP ~ 150s  No significant rales  No edema  RRR, S1S2+  Impression: D/D ACS / Myopericarditis . PE ruled out by negative marker. CXR without widened mediastinum  DW patient regarding management strategy which includes medical management vs. Ischemia evaluation ( non-invasive vs. Invasive). Risk, benefit and alternatives of each strategy discussed in detail.  Risk, benefit, complication of LHC and possible PCI ( including but not limited to bleeding, vascular trauma requiring surgery,  infection, heart failure, stroke, MI, emergent bypass surgery, severe allergic reactions, kidney failure, dialysis and death ) were discussed with patient and willing to proceed with procedure. Will be using moderate sedation    Total critical care time spent in reviewing the case, multiple EMR databases, physician notes, procedure notes, examining the patient, and documentation, is > 45 minutes.   Discussed in details with patient at bedside. All questions were answered to their full satisfaction. Risk, benefit and alternatives were discussed. >50% time spent in counseling and coordination of care. He talked to his sister about procedure and does not want me to call her. She is next of kin.

## 2016-03-05 NOTE — H&P (Signed)
History and Physical    Patient: William Griffin MRN: [id number]  SSN: [id number]    Date of Birth: [birth year]  Age: 35 y.o.  Sex: male      Subjective:      William Griffin is a 35 y.o. male with PMH of PAD and prediabetes, non compliant to meds who presents to ER with continued Lt sided Chest pain that radiated to his Lt arm and Jaw without sweating and or SOB.   Pt described his chest pain as heavy pressure on his chest but non radiating 9/10 in severity. Pt denied any drug abuse recently  On admission, Pt was found to have elevated troponin and was seen by Cardiology and started on Heparin drip.  Pt was then rushed to cath lab where it showed NSTEMI from OM2 distal occlusion  Without stent placement.  Pt is seen and examined after cath. States that pain still present in middle of his chest with low severity 2-3/10. Pt just had his dinner    Currently denies SOB, abdominal sensation and alert oriented x 3. Cath site is clean w/out swelling    Pt denies any family Hx of CAD. Was Dx recently with prediabetes? But not compliant with his Metformin    Past Medical History:   Diagnosis Date   ??? Asthma    ??? Chronic pain    ??? Insufficiency, arterial, peripheral (HCC) 11/28/2015    See [facility]L from [facility]   ??? Neuropathy    ??? NSTEMI (non-ST elevated myocardial infarction) (HCC) 02/2016   ??? Pre-diabetes 01/08/2016    A1C 6.2   ??? Sciatica    ??? Vitamin D deficiency      Past Surgical History:   Procedure Laterality Date   ??? 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 HX ORTHOPAEDIC        Family History   Problem Relation Age of Onset   ??? Hypertension Mother    ??? Stroke Mother    ??? Hypertension Father    ??? Asthma Sister      Social History   Substance Use Topics   ??? Smoking status: Former Smoker     Packs/day: 0.50     Types: Cigarettes   ??? Smokeless tobacco: Never Used      Comment: quit about 5 months ago   ??? Alcohol use No      Comment: occasional "pt stated that he stop drinking"      Prior to Admission medications     Medication Sig Start Date End Date Taking? Authorizing Provider   cyclobenzaprine (FLEXERIL) 10 mg tablet Take 1 Tab by mouth three (3) times daily as needed for Muscle Spasm(s). 02/11/16  Yes April J Harvill, PA-C   gabapentin (NEURONTIN) 300 mg capsule Take 1 Cap by mouth three (3) times daily. 01/29/16  Yes Rusty Aus, MD   metFORMIN ER (GLUCOPHAGE XR) 500 mg tablet Take 1 Tab by mouth daily (with dinner). Indications: PREVENTION OF TYPE 2 DIABETES MELLITUS 01/22/16  Yes Manfred Shirts, NP   cyanocobalamin (VITAMIN B12) 500 mcg tablet Take 1 Tab by mouth daily. 02/04/16   Manfred Shirts, NP   pregabalin (LYRICA) 100 mg capsule Take 1 Cap by mouth two (2) times a day. Max Daily Amount: 200 mg. Indications: neuropathic pain 02/04/16   Manfred Shirts, NP        No Known Allergies    Review of Systems:  Review of Systems  Constitutional: Positive for malaise/fatigue. Negative for chills, fever and weight loss.   HENT: Negative for congestion, hearing loss, sore throat and tinnitus.    Eyes: Negative for blurred vision, double vision and photophobia.   Respiratory: Negative for cough, hemoptysis, sputum production, shortness of breath and stridor.    Cardiovascular: Positive for chest pain. Negative for palpitations, orthopnea and leg swelling.   Gastrointestinal: Positive for heartburn. Negative for abdominal pain, diarrhea, nausea and vomiting.   Genitourinary: Negative for dysuria, frequency and urgency.   Musculoskeletal: Positive for myalgias. Negative for back pain, falls and neck pain.   Skin: Negative for itching and rash.   Neurological: Positive for tingling, sensory change and headaches. Negative for dizziness, tremors, speech change, focal weakness and seizures.   Psychiatric/Behavioral: Negative for depression, substance abuse and suicidal ideas.         Objective:     Vitals:    03/05/16 2000 03/05/16 2029 03/05/16 2210 03/05/16 2346   BP: (!) 134/91 (!) 139/97 123/84 (!) 169/114    Pulse: 89 67 74 82   Resp: 20  18 20    Temp:   97.4 ??F (36.3 ??C) 97.4 ??F (36.3 ??C)   SpO2: 100%  98% 100%   Weight:            Physical Exam:  Physical Exam   Constitutional: He is oriented to person, place, and time. He appears well-developed.   HENT:   Head: Normocephalic and atraumatic.   Eyes: Conjunctivae are normal. Pupils are equal, round, and reactive to light.   Neck: Normal range of motion. Neck supple. No JVD present. No thyromegaly present.   Cardiovascular: Normal rate, regular rhythm and normal heart sounds.    No murmur heard.  Pulmonary/Chest: Effort normal and breath sounds normal. No respiratory distress. He has no wheezes.   Abdominal: Soft. He exhibits no distension.   Musculoskeletal: Normal range of motion.   Rt hand splint   Neurological: He is alert and oriented to person, place, and time. He has normal reflexes. No cranial nerve deficit.   Skin: Skin is warm and dry. He is not diaphoretic.   Cath site clean, no swelling   Psychiatric: He has a normal mood and affect. His behavior is normal.     Assessment:     Hospital Problems  Date Reviewed: 02/07/2016          Codes Class Noted POA    GERD (gastroesophageal reflux disease) ICD-10-CM: K21.9  ICD-9-CM: 530.81  03/06/2016 Yes        * (Principal)NSTEMI (non-ST elevated myocardial infarction) (HCC) ICD-10-CM: I21.4  ICD-9-CM: 410.70  03/05/2016 Yes        Pre-diabetes ICD-10-CM: R73.03  ICD-9-CM: 790.29  01/08/2016 Yes    Overview Signed 01/22/2016  4:27 PM by Manfred ShirtsKristel D Spalding, NP     A1C 6.2             Elevated BP without diagnosis of hypertension ICD-10-CM: R03.0  ICD-9-CM: 796.2  08/20/2015 Yes              Plan:     [name] is a 35 y.o. male with pmhx of PAD, New dx Pre-Diabtic, vascular insufficiency s/p toe amputation is admitted for NSTEMI s/p angioplasty w/out stent placemat 2 ry to  OM2 distal occlusion     Case D/W Cardiology Dr Allena KatzPatel    NSTEMI >> continue ASA , plavix and Heparin drip, Imdur   Pt is also hypertensive >> continue BB  Can add small dose of Lisinopril if not controlled  Normal EF on Echo w/out wall abnormalities  Continue statin     Continue Ranexa for anginal pain as per cardiology    Prediabetes >> A1C 6.2 >> will repeat A1C  Holding Metformin for now and start SSI    Peripheral Neuropathy >> continue Lyrica and holding Gabapentin    Reflux like Sx at night >> will start Protonix and monitor    Can be d/c in the afternoon if continue to be stable         Signed By: Roe CoombsGamal S Kayra Crowell, MD     March 06, 2016

## 2016-03-05 NOTE — ED Notes (Signed)
Pt observed resting on stretcher in stable condition

## 2016-03-05 NOTE — Progress Notes (Signed)
STAT Echocardiogram completed at bedside in ED. Report to follow. Dr Duanne Moronallaghan in room for exam.

## 2016-03-05 NOTE — ED Notes (Signed)
Pt provided morphine IV for complaints of chest pain

## 2016-03-05 NOTE — ED Notes (Signed)
Pt made aware of plan for cardiac cardia catherization pt made aware

## 2016-03-05 NOTE — ED Notes (Signed)
Pt taken to cardiac cath lab at this time, left ED in stable condition with this nurse, Dr Allena KatzPatel and cath lab RN. Pt transferred on cardiac monitor

## 2016-03-05 NOTE — Consults (Signed)
[facility] - Consult Note    Consultation request by No admitting provider for patient encounter. for advice/opinion related to evaluating There are no admission diagnoses documented for this encounter.     CP    Date of  Admission: 03/05/2016 11:08 AM   Primary Care Physician:  Manfred ShirtsKristel D Spalding, NP  Patient seen and examined independently. CP unrelenting for several hours with only minimal troponin elevation and unremarkable ECG. Personally reviewed echocardiographic images which demonstrate full movement and contraction of all walls without a significant pericardial effusion. There is a suggestion of a pleural effusion although not on CXR. Will continue anticoagulation and trend out cardiac enzymes. Agree with assessment and plan as noted below. Dondra PraderW Tahirah Sara MD   Assessment:     Patient Active Problem List   Diagnosis Code   ??? Sciatica M54.30   ??? Therapeutic drug monitoring Z51.81   ??? Peripheral vascular disease (HCC) I73.9   ??? 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 Neuropathic pain M79.2   ??? Elevated BP without diagnosis of hypertension R03.0   ??? Influenza vaccination declined Z28.21   ??? Pre-diabetes R73.03       -Chest pain, atypical constant since 9 AM. Troponin remains indeterminate despite 4-5 hours of chest pain. ECG likely repolarization abnormalities.  -PAD, moderate left leg, recently evaluated by vascular team, no plans for angiography at this time.  -Pre-diabetes, not on medications, although metformin on his home medication list.  -Chronic pain with h/o mainly left leg neuropathy with foot drop.  -H/o elevated blood pressure, not yet started on medications, elevated on admission.  -H/o borderline cholesterol, not yet started on medications.  -H/o left second toe amputation following infection several months ago.  -H/o tobacco use.    No previous cardiologist[facility]       Plan:     Will get stat bedside echo.  Will repeat troponin and  ECG.  Will continue to titrate up nitroglycerine drip.  Continue heparin infusion.  Recommend empiric ASA, statin, BB.  Will need ischemic evaluation pending course, will make further recommendations pending symptom response and echocardiogram results.     History of Present Illness:     This is a 35 y.o. male admitted for There are no admission diagnoses documented for this encounter..      Patient complains of:  CP. Patient is a 35 year old male with history of borderline HTN/DM/LIPIDS (not on medications) and PAD, remote tobacco abuse. [facility] Denies previous cardiology evaluation. Patient reports yesterday morning he had episode of chest fullness/pressure, felt like gas and went away after taking tramadol. Patient reports this morning around 9 am he developed chest pressure again with radiation to left arm. He denies any alleviating or exacerbating factors. However he reports it feels a little better when lying flat on exam. He has been started on heparin and nitro infusion.      Cardiac risk factors:  H/o borderline HTN/lipids/DM    Review of Symptoms:   Constitutional: negative for fevers  Eyes: negative for visual disturbance  Ears, nose, mouth, throat, and face: negative  Respiratory: negative for cough  Cardiovascular: positive for chest pain  Gastrointestinal: negative for vomiting  Genitourinary:negative for dysuria  Hematologic/lymphatic: negative for bleeding  Musculoskeletal:negative for muscle weakness  Neurological: negative for dizziness     Past Medical History:     Past Medical History:   Diagnosis Date   ???  Asthma    ??? Chronic pain    ??? Insufficiency, arterial, peripheral (HCC) 11/28/2015    [facility]   ??? Neuropathy    ??? Pre-diabetes 01/08/2016    A1C 6.2   ??? Sciatica    ??? Vitamin D deficiency          Social History:     Social History     Social History   ??? Marital status: SINGLE     Spouse name: N/A   ??? Number of children: N/A   ??? 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 Smoking status: Former Smoker     Packs/day: 0.50     Types: Cigarettes   ??? Smokeless tobacco: Never Used      Comment: quit about 5 months ago   ??? Alcohol use No      Comment: occasional "pt stated that he stop drinking"   ??? Drug use: No   ??? Sexual activity: Not on file     Other Topics Concern   ??? Financial plannerMilitary Service Yes     [employer] 6 years   ??? Blood Transfusions No   ??? Caffeine Concern No   ??? Occupational Exposure No   ??? Hobby Hazards No   ??? Sleep Concern No   ??? Stress Concern No   ??? Weight Concern No   ??? Special Diet No   ??? Back Care No   ??? Exercise Yes   ??? Bike Helmet No   ??? Seat Belt Yes     Social History Narrative        Family History:     Family History   Problem Relation Age of Onset   ??? Hypertension Mother    ??? Stroke Mother    ??? Hypertension Father    ??? Asthma Sister         Medications:   No Known Allergies     Current Facility-Administered Medications   Medication Dose Route Frequency   ??? nitroglycerin (TRIDIL) 400 mcg/ml infusion  0-200 mcg/min IntraVENous TITRATE   ??? heparin 25,000 units in D5W 250 ml infusion  14-36 Units/kg/hr IntraVENous TITRATE     Current Outpatient Prescriptions   Medication Sig   ??? cyclobenzaprine (FLEXERIL) 10 mg tablet Take 1 Tab by mouth three (3) times daily as needed for Muscle Spasm(s).   ??? ibuprofen (MOTRIN) 600 mg tablet Take 1 Tab by mouth every six (6) hours as needed for Pain.   ??? cyanocobalamin (VITAMIN B12) 500 mcg tablet Take 1 Tab by mouth daily.   ??? pregabalin (LYRICA) 100 mg capsule Take 1 Cap by mouth two (2) times a day. Max Daily Amount: 200 mg. Indications: neuropathic pain   ??? gabapentin (NEURONTIN) 300 mg capsule Take 1 Cap by mouth three (3) times daily.   ??? metFORMIN ER (GLUCOPHAGE XR) 500 mg tablet Take 1 Tab by mouth daily (with dinner). Indications: PREVENTION OF TYPE 2 DIABETES MELLITUS         Physical Exam:     Visit Vitals   ??? BP (!) 149/101   ??? Pulse 84   ??? Temp 98.1 ??F (36.7  ??C)   ??? Resp 14   ??? Wt 68 kg (150 lb)   ??? SpO2 100%   ??? BMI 24.96 kg/m2     BP Readings from Last 3 Encounters:   03/05/16 Marland Kitchen(!)  149/101   02/11/16 (!) 157/100   02/06/16 130/82     Pulse Readings from Last 3 Encounters:   03/05/16 84   02/11/16 79   02/06/16 74     Wt Readings from Last 3 Encounters:   03/05/16 68 kg (150 lb)   02/11/16 68 kg (150 lb)   02/06/16 71.2 kg (157 lb)       General:  alert, cooperative, no distress, appears stated age  Neck:  no JVD  Lungs:  clear to auscultation bilaterally  Heart:  regular rate and rhythm, S1, S2 normal, no murmur, click, rub or gallop  Abdomen:  abdomen is soft without significant tenderness, masses, organomegaly or guarding  Extremities:  extremities normal, atraumatic, no cyanosis or edema  Skin: Warm and dry. no hyperpigmentation, vitiligo, or suspicious lesions  Neuro: alert, oriented x3, affect appropriate  Psych: non focal     Data Review:     Recent Labs      03/05/16   1102   WBC  5.0   HGB  13.2   HCT  39.2   PLT  242     Recent Labs      03/05/16   1102   NA  135*   K  4.3   CL  101   CO2  30   GLU  94   BUN  7   CREA  0.88   CA  9.1   ALB  3.5   SGOT  17   ALT  30   INR  0.9       Results for orders placed or performed during the hospital encounter of 03/05/16   EKG, 12 LEAD, INITIAL   Result Value Ref Range    Ventricular Rate 77 BPM    Atrial Rate 77 BPM    P-R Interval 140 ms    QRS Duration 90 ms    Q-T Interval 368 ms    QTC Calculation (Bezet) 416 ms    Calculated P Axis 57 degrees    Calculated R Axis 59 degrees    Calculated T Axis 58 degrees    Diagnosis       Normal sinus rhythm with sinus arrhythmia  Early repolarization  Normal ECG  When compared with ECG of 05-Mar-2016 10:58,  No significant change was found         All Cardiac Markers in the last 24 hours:    Lab Results   Component Value Date/Time    CPK 168 03/05/2016 12:35 PM    CPK 148 03/05/2016 11:02 AM    CKMB 1.8 03/05/2016 12:35 PM    CKMB 1.6 03/05/2016 11:02 AM    CKND1 1.1 03/05/2016  12:35 PM    CKND1 1.1 03/05/2016 11:02 AM    TROIQ 0.50 (H) 03/05/2016 12:35 PM    TROIQ 0.49 (H) 03/05/2016 11:02 AM       Last Lipid:    Lab Results   Component Value Date/Time    Cholesterol, total 162 01/08/2016 07:59 AM    HDL Cholesterol 39 01/08/2016 07:59 AM    LDL, calculated 109.4 01/08/2016 07:59 AM    Triglyceride 68 01/08/2016 07:59 AM    CHOL/HDL Ratio 4.2 01/08/2016 07:59 AM       Signed By: Tiney Rouge, PA     March 05, 2016

## 2016-03-05 NOTE — Procedures (Signed)
Procedures  by Tretha SciaraPatel, Peter Daquila V, MD at 03/05/16 2144                Author: Tretha SciaraPatel, Nam Vossler V, MD  Service: Cardiology  Author Type: Physician       Filed: 03/05/16 2152  Date of Service: 03/05/16 2144  Status: Signed          Editor: Tretha SciaraPatel, Zebulan Hinshaw V, MD (Physician)                                          cardiac cath report.         PreOpDiagnosis:    Acute coronary syndrome, NSTEMI   HTN      Findings/PostOp Diagnosis:   1. Distal 100% occlusion of tortuous OM2 branch, medical management      Plan:   1. ASA, Plavix, IV heparin , BB and statin      Procedures:   * LHC    * LV angiography   * Selective Bilateral Coronary Angiography   * Conscious sedation using versad and fentanyl      Procedure detail:   Risks, benefits, and alternatives were explained to the patient and appropriate informed consent was obtained prior to the procedure. The patient was brought to the cath lab and she was prepped and draped in the usual sterile manner.?? Moderate sedation  was achieved with the appropriate medications. Lidocaine was used to secure local anesthesia.    The right radial artery was cannulated using a modified Seldinger technique and a 5 French sheath was placed.??   LHC was performed using 85F tiger catheter   All catheter was exchanged and advance over 0.035 inch J tip guide wire.   No immediate complication noted.   No major hamatoma or bleeding immediately.   Closure: TR band   Estimated Blood Loss: Minimal   Specimens: None   Assistants: Per [facility]   Complications: None      Conscious sedation : Using versad 1 mg and Fentanyl 25 mcg. Total time ~ 30 mins      LVGram:     EF:  55% without significant wall motion abnormalities     Mitral Regurgitation: No significant     No significant gradient across the aortic valve     LVEDP ~ 12 mm hg      Coronary angiography:   Dominance: Left   LM: Angiographically normal   LAD: Minimal 10% luminal otherwise normal, Large vessel wraps around apex   Diagonal: Large and no  obvious obstructive disease   LCX: Large, dominant. Mid 30% plaque   OM1: large, normal   OM2: Medium caliber, tortuous vessel, distal 100% occlusion ( ~ 1.75/2.0 mm vessel at that level)   OM3 and LPDA: Normal   RCA: Small, non-dominant, no obstructive disease   NSTEMI from OM2 distal occlusion but plan is to continue medical management. Because of tortuosity and distal vessel being no more than 2 mm vessel, plan to continue medical management. Risk/benefit ratio favour medical management in my opinion.          Tretha SciaraSaumil Mako Pelfrey V, MD   03/05/2016, 9:44 PM

## 2016-03-05 NOTE — Consults (Signed)
Came to see patient again as patient continues to have pain  Patient seen earlier by Dr.Callaghan for chest pain but troponin was 0.5 despite hours of pain and normal ECHO '  Patient continues to have CP despite IV NTG started by ED  Tried to controll BP with IV BB and IV NTG but pain persistent  All EKGs, ECHO reviewed.  Labs and enzymes reviewed  Patient examined  BP ~ 150s  No significant rales  No edema  RRR, S1S2+  Impression: D/D ACS / Myopericarditis . PE ruled out by negative marker. CXR without widened mediastinum  DW patient regarding management strategy which includes medical management vs. Ischemia evaluation ( non-invasive vs. Invasive). Risk, benefit and alternatives of each strategy discussed in detail.  Risk, benefit, complication of LHC and possible PCI ( including but not limited to bleeding, vascular trauma requiring surgery,  infection, heart failure, stroke, MI, emergent bypass surgery, severe allergic reactions, kidney failure, dialysis and death ) were discussed with patient and willing to proceed with procedure. Will be using moderate sedation    Total critical care time spent in reviewing the case, multiple EMR databases, physician notes, procedure notes, examining the patient, and documentation, is > 45 minutes.   Discussed in details with patient at bedside. All questions were answered to their full satisfaction. Risk, benefit and alternatives were discussed. >50% time spent in counseling and coordination of care. He talked to his sister about procedure and does not want me to call her. She is next of kin.

## 2016-03-06 LAB — DRUG SCREEN, URINE
AMPHETAMINES: NEGATIVE
BARBITURATES: NEGATIVE
BENZODIAZEPINES: NEGATIVE
COCAINE: NEGATIVE
METHADONE: NEGATIVE
OPIATES: POSITIVE — AB
PCP(PHENCYCLIDINE): NEGATIVE
THC (TH-CANNABINOL): NEGATIVE

## 2016-03-06 LAB — CARDIAC PANEL,(CK, CKMB & TROPONIN)
CK - MB: 35.8 ng/ml — ABNORMAL HIGH (ref <3.6)
CK-MB Index: 5.7 % — ABNORMAL HIGH (ref 0.0–4.0)
CK: 632 U/L — ABNORMAL HIGH (ref 39–308)
Troponin-I, QT: 21.7 NG/ML — CR (ref 0.0–0.045)

## 2016-03-06 LAB — PTT
aPTT: 144.9 s — ABNORMAL HIGH (ref 23.0–36.4)
aPTT: 64 s — ABNORMAL HIGH (ref 23.0–36.4)
aPTT: 88.8 s — ABNORMAL HIGH (ref 23.0–36.4)
aPTT: 89.6 s — ABNORMAL HIGH (ref 23.0–36.4)

## 2016-03-06 LAB — LIPID PANEL
CHOL/HDL Ratio: 3 (ref 0–5.0)
Cholesterol, total: 167 MG/DL (ref <200)
HDL Cholesterol: 56 MG/DL (ref 40–60)
LDL, calculated: 91.4 MG/DL (ref 0–100)
Triglyceride: 98 MG/DL (ref <150)
VLDL, calculated: 19.6 MG/DL

## 2016-03-06 LAB — HEMOGLOBIN A1C W/O EAG: Hemoglobin A1c: 5.9 % — ABNORMAL HIGH (ref 4.2–5.6)

## 2016-03-06 LAB — GLUCOSE, POC
Glucose (POC): 103 mg/dL (ref 70–110)
Glucose (POC): 111 mg/dL — ABNORMAL HIGH (ref 70–110)

## 2016-03-06 MED ORDER — HEPARIN (PORCINE) IN NS (PF) 1,000 UNIT/500 ML IV
1000 unit/500 mL | Freq: Once | INTRAVENOUS | Status: AC
Start: 2016-03-06 — End: 2016-03-05
  Administered 2016-03-06: 02:00:00

## 2016-03-06 MED ORDER — VERAPAMIL 2.5 MG/ML IV
2.5 mg/mL | Freq: Once | INTRAVENOUS | Status: AC
Start: 2016-03-06 — End: 2016-03-05
  Administered 2016-03-06: 02:00:00 via INTRAVENOUS

## 2016-03-06 MED ORDER — GLUCAGON 1 MG INJECTION
1 mg | INTRAMUSCULAR | Status: DC | PRN
Start: 2016-03-06 — End: 2016-03-08

## 2016-03-06 MED ORDER — INSULIN LISPRO 100 UNIT/ML INJECTION
100 unit/mL | Freq: Three times a day (TID) | SUBCUTANEOUS | Status: DC
Start: 2016-03-06 — End: 2016-03-08

## 2016-03-06 MED ORDER — FENTANYL CITRATE (PF) 50 MCG/ML IJ SOLN
50 mcg/mL | INTRAMUSCULAR | Status: DC | PRN
Start: 2016-03-06 — End: 2016-03-05
  Administered 2016-03-06: 02:00:00 via INTRAVENOUS

## 2016-03-06 MED ORDER — SODIUM CHLORIDE 0.9 % IV
INTRAVENOUS | Status: AC
Start: 2016-03-06 — End: 2016-03-06
  Administered 2016-03-06: 04:00:00 via INTRAVENOUS

## 2016-03-06 MED ORDER — MORPHINE 2 MG/ML INJECTION
2 mg/mL | INTRAMUSCULAR | Status: DC | PRN
Start: 2016-03-06 — End: 2016-03-05

## 2016-03-06 MED ORDER — ATORVASTATIN 40 MG TAB
40 mg | Freq: Every evening | ORAL | Status: DC
Start: 2016-03-06 — End: 2016-03-08
  Administered 2016-03-06 – 2016-03-08 (×3): via ORAL

## 2016-03-06 MED ORDER — ONDANSETRON (PF) 4 MG/2 ML INJECTION
4 mg/2 mL | INTRAMUSCULAR | Status: AC
Start: 2016-03-06 — End: 2016-03-05
  Administered 2016-03-06: 02:00:00

## 2016-03-06 MED ORDER — METOPROLOL TARTRATE 50 MG TAB
50 mg | Freq: Three times a day (TID) | ORAL | Status: DC
Start: 2016-03-06 — End: 2016-03-06

## 2016-03-06 MED ORDER — SODIUM CHLORIDE 0.9 % INJECTION
40 mg | Freq: Once | INTRAMUSCULAR | Status: AC
Start: 2016-03-06 — End: 2016-03-06
  Administered 2016-03-06: 08:00:00 via INTRAVENOUS

## 2016-03-06 MED ORDER — METOPROLOL TARTRATE 5 MG/5 ML IV SOLN
5 mg/ mL | Freq: Once | INTRAVENOUS | Status: AC
Start: 2016-03-06 — End: 2016-03-06
  Administered 2016-03-06: 15:00:00 via INTRAVENOUS

## 2016-03-06 MED ORDER — METOPROLOL TARTRATE 5 MG/5 ML IV SOLN
5 mg/ mL | Freq: Once | INTRAVENOUS | Status: AC
Start: 2016-03-06 — End: 2016-03-06
  Administered 2016-03-06: 16:00:00 via INTRAVENOUS

## 2016-03-06 MED ORDER — NITROGLYCERIN 2 MG/5 ML (400 MCG/ML) COMPOUNDED INJ HR
400 mcg/mL | Status: DC | PRN
Start: 2016-03-06 — End: 2016-03-05
  Administered 2016-03-06: 02:00:00 via INTRACORONARY

## 2016-03-06 MED ORDER — MIDAZOLAM 1 MG/ML IJ SOLN
1 mg/mL | INTRAMUSCULAR | Status: DC | PRN
Start: 2016-03-06 — End: 2016-03-05
  Administered 2016-03-06: 02:00:00 via INTRAVENOUS

## 2016-03-06 MED ORDER — LIDOCAINE (PF) 10 MG/ML (1 %) IJ SOLN
10 mg/mL (1 %) | INTRAMUSCULAR | Status: DC | PRN
Start: 2016-03-06 — End: 2016-03-05
  Administered 2016-03-06: 02:00:00 via SUBCUTANEOUS

## 2016-03-06 MED ORDER — CLOPIDOGREL 75 MG TAB
75 mg | Freq: Every day | ORAL | Status: DC
Start: 2016-03-06 — End: 2016-03-08
  Administered 2016-03-06 – 2016-03-08 (×3): via ORAL

## 2016-03-06 MED ORDER — AMLODIPINE 5 MG TAB
5 mg | Freq: Every day | ORAL | Status: DC
Start: 2016-03-06 — End: 2016-03-08
  Administered 2016-03-06 – 2016-03-08 (×3): via ORAL

## 2016-03-06 MED ORDER — METOPROLOL TARTRATE 25 MG TAB
25 mg | Freq: Two times a day (BID) | ORAL | Status: DC
Start: 2016-03-06 — End: 2016-03-05

## 2016-03-06 MED ORDER — ASPIRIN 81 MG CHEWABLE TAB
81 mg | Freq: Every day | ORAL | Status: DC
Start: 2016-03-06 — End: 2016-03-08
  Administered 2016-03-06 – 2016-03-08 (×3): via ORAL

## 2016-03-06 MED ORDER — METOPROLOL TARTRATE 5 MG/5 ML IV SOLN
5 mg/ mL | INTRAVENOUS | Status: AC
Start: 2016-03-06 — End: 2016-03-06
  Administered 2016-03-06: 16:00:00

## 2016-03-06 MED ORDER — METOPROLOL TARTRATE 5 MG/5 ML IV SOLN
5 mg/ mL | INTRAVENOUS | Status: AC
Start: 2016-03-06 — End: 2016-03-05
  Administered 2016-03-06: 01:00:00 via INTRAVENOUS

## 2016-03-06 MED ORDER — HYDRALAZINE 20 MG/ML IJ SOLN
20 mg/mL | Freq: Four times a day (QID) | INTRAMUSCULAR | Status: DC | PRN
Start: 2016-03-06 — End: 2016-03-08

## 2016-03-06 MED ORDER — OXYCODONE-ACETAMINOPHEN 5 MG-325 MG TAB
5-325 mg | Freq: Four times a day (QID) | ORAL | Status: DC | PRN
Start: 2016-03-06 — End: 2016-03-08
  Administered 2016-03-06 – 2016-03-08 (×4): via ORAL

## 2016-03-06 MED ORDER — ISOSORBIDE DINITRATE 5 MG TAB
5 mg | Freq: Three times a day (TID) | ORAL | Status: DC
Start: 2016-03-06 — End: 2016-03-06
  Administered 2016-03-06 (×2): via ORAL

## 2016-03-06 MED ORDER — METOPROLOL TARTRATE 25 MG TAB
25 mg | Freq: Three times a day (TID) | ORAL | Status: DC
Start: 2016-03-06 — End: 2016-03-06
  Administered 2016-03-06 (×2): via ORAL

## 2016-03-06 MED ORDER — IOPAMIDOL 61 % IV SOLN
300 mg iodine /mL (61 %) | INTRAVENOUS | Status: DC | PRN
Start: 2016-03-06 — End: 2016-03-05
  Administered 2016-03-06: 03:00:00 via INTRAVENOUS

## 2016-03-06 MED ORDER — HEPARIN (PORCINE) IN NS (PF) 1,000 UNIT/500 ML IV
1000 unit/500 mL | Freq: Once | INTRAVENOUS | Status: DC
Start: 2016-03-06 — End: 2016-03-05

## 2016-03-06 MED ORDER — GLUCOSE 4 GRAM CHEWABLE TAB
4 gram | ORAL | Status: DC | PRN
Start: 2016-03-06 — End: 2016-03-08

## 2016-03-06 MED ORDER — HYDROMORPHONE 0.5 MG/0.5 ML SYRINGE
0.5 mg/ mL | INTRAMUSCULAR | Status: AC | PRN
Start: 2016-03-06 — End: 2016-03-07
  Administered 2016-03-06 – 2016-03-07 (×6): via INTRAVENOUS

## 2016-03-06 MED ORDER — DEXTROSE 50% IN WATER (D50W) IV SYRG
INTRAVENOUS | Status: DC | PRN
Start: 2016-03-06 — End: 2016-03-08

## 2016-03-06 MED ORDER — HEPARIN (PORCINE) 1,000 UNIT/ML IJ SOLN
1000 unit/mL | INTRAMUSCULAR | Status: DC | PRN
Start: 2016-03-06 — End: 2016-03-05

## 2016-03-06 MED ORDER — METOPROLOL TARTRATE 50 MG TAB
50 mg | Freq: Three times a day (TID) | ORAL | Status: DC
Start: 2016-03-06 — End: 2016-03-08
  Administered 2016-03-06 – 2016-03-08 (×7): via ORAL

## 2016-03-06 MED ORDER — ACETAMINOPHEN 325 MG TABLET
325 mg | Freq: Four times a day (QID) | ORAL | Status: DC | PRN
Start: 2016-03-06 — End: 2016-03-06

## 2016-03-06 MED ORDER — PREGABALIN 50 MG CAP
50 mg | Freq: Two times a day (BID) | ORAL | Status: DC
Start: 2016-03-06 — End: 2016-03-08
  Administered 2016-03-06 – 2016-03-08 (×5): via ORAL

## 2016-03-06 MED FILL — HYDROMORPHONE 0.5 MG/0.5 ML SYRINGE: 0.5 mg/ mL | INTRAMUSCULAR | Qty: 0.5

## 2016-03-06 MED FILL — METOPROLOL TARTRATE 25 MG TAB: 25 mg | ORAL | Qty: 1

## 2016-03-06 MED FILL — CLOPIDOGREL 75 MG TAB: 75 mg | ORAL | Qty: 1

## 2016-03-06 MED FILL — NITROGLYCERIN 2 MG/5 ML (400 MCG/ML) COMPOUNDED INJ HR: 400 mcg/mL | Qty: 5

## 2016-03-06 MED FILL — METOPROLOL TARTRATE 5 MG/5 ML IV SOLN: 5 mg/ mL | INTRAVENOUS | Qty: 5

## 2016-03-06 MED FILL — SODIUM CHLORIDE 0.9 % IV: INTRAVENOUS | Qty: 1000

## 2016-03-06 MED FILL — ISOSORBIDE DINITRATE 5 MG TAB: 5 mg | ORAL | Qty: 1

## 2016-03-06 MED FILL — METOPROLOL TARTRATE 50 MG TAB: 50 mg | ORAL | Qty: 1

## 2016-03-06 MED FILL — PROTONIX 40 MG INTRAVENOUS SOLUTION: 40 mg | INTRAVENOUS | Qty: 40

## 2016-03-06 MED FILL — NITROGLYCERIN IN D5W 100 MG/250 ML (0.4 MG/ML) IV: 100 mg/250 mL (400 mcg/mL) | INTRAVENOUS | Qty: 250

## 2016-03-06 MED FILL — ASPIRIN 81 MG CHEWABLE TAB: 81 mg | ORAL | Qty: 1

## 2016-03-06 MED FILL — FENTANYL CITRATE (PF) 50 MCG/ML IJ SOLN: 50 mcg/mL | INTRAMUSCULAR | Qty: 2

## 2016-03-06 MED FILL — AMLODIPINE 5 MG TAB: 5 mg | ORAL | Qty: 1

## 2016-03-06 MED FILL — OXYCODONE-ACETAMINOPHEN 5 MG-325 MG TAB: 5-325 mg | ORAL | Qty: 1

## 2016-03-06 MED FILL — ATORVASTATIN 40 MG TAB: 40 mg | ORAL | Qty: 1

## 2016-03-06 MED FILL — VERAPAMIL 2.5 MG/ML IV: 2.5 mg/mL | INTRAVENOUS | Qty: 2

## 2016-03-06 MED FILL — LYRICA 50 MG CAPSULE: 50 mg | ORAL | Qty: 2

## 2016-03-06 MED FILL — MIDAZOLAM 1 MG/ML IJ SOLN: 1 mg/mL | INTRAMUSCULAR | Qty: 2

## 2016-03-06 MED FILL — ONDANSETRON (PF) 4 MG/2 ML INJECTION: 4 mg/2 mL | INTRAMUSCULAR | Qty: 2

## 2016-03-06 MED FILL — BREVIBLOC 2,500 MG/250 ML (10 MG/ML) IN SODIUM CHLORIDE (ISO-OSM) IV: 2500 mg/250 mL (10 mg/mL) | INTRAVENOUS | Qty: 250

## 2016-03-06 MED FILL — HEPARIN (PORCINE) 1,000 UNIT/500 ML (2 UNIT/ML) IN 0.9 % NACL IV SOLN: 1000 unit/500 mL (2 unit/mL) | INTRAVENOUS | Qty: 500

## 2016-03-06 NOTE — Progress Notes (Signed)
[facility] Hospitalist Group  Progress Note    Patient: William Griffin Age: 35 y.o. DOB: 1980/10/21 MR#: [id number] SSN: [id number]  Date: 03/06/2016     Subjective:     Pt reports chest pain radiating to his left shoulder. Denies any SOB, reflux, heartburn, N/V/D/C, weakness, dizziness, or headache.     Assessment/Plan:   1. NSTEMI s/p LHC with OM2 distal occlusion: continue medical management per cards. Continue  ASA, Lipitor, Plavix, Isordil, metoprolol. Metoprolol IV per cards. Repeat cardiac panel ordered. On Heparin ggt. Nitro drip will be re-started per cards recs. Repeat EKG.   2. Hypertension: continue BP meds  3. Prediabetes: Hgb A1C 6.2 in sept. Repeat pending. On SSI, pt was non-compliant with metformin outpatient.   4. Peripheral Neuropathy: continue Lyrica      Additional Notes:      Case discussed with:  Patient  Family  Nursing  Case Management  DVT Prophylaxis:  Lovenox  Hep SQ  SCDs  Coumadin   On Heparin gtt    Objective:   VS:   Visit Vitals   ??? BP 156/89 (BP 1 Location: Left arm, BP Patient Position: At rest)   ??? Pulse 82   ??? Temp 97.4 ??F (36.3 ??C)   ??? Resp 22   ??? Wt 70.2 kg (154 lb 11.2 oz)   ??? SpO2 95%   ??? BMI 25.74 kg/m2      Tmax/24hrs: Temp (24hrs), Avg:97.6 ??F (36.4 ??C), Min:97.4 ??F (36.3 ??C), Max:98.1 ??F (36.7 ??C)    Intake/Output Summary (Last 24 hours) at 03/06/16 1004  Last data filed at 03/06/16 0437   Gross per 24 hour   Intake              315 ml   Output             1600 ml   Net            -1285 ml       General:  Awake, alert, NAD  Cardiovascular:  RRR  Pulmonary:  CTA  GI:  NT, normal bowel sounds  Extremities:  No edema or cyanosis  Additional:      Labs:    Recent Results (from the past 24 hour(s))   EKG, 12 LEAD, INITIAL    Collection Time: 03/05/16 10:58 AM   Result Value Ref Range    Ventricular Rate 72 BPM    Atrial Rate 72 BPM    P-R Interval 144 ms    QRS Duration 88 ms    Q-T Interval 352 ms    QTC Calculation (Bezet) 385 ms     Calculated P Axis 56 degrees    Calculated R Axis 37 degrees    Calculated T Axis 37 degrees    Diagnosis       Normal sinus rhythm  Normal ECG  When compared with ECG of 01-Oct-2015 22:05,  Vent. rate has decreased BY  44 BPM  Nonspecific T wave abnormality no longer evident in Inferior leads  Nonspecific T wave abnormality no longer evident in Lateral leads  Confirmed by Lutricia Feil, MD, Marc [phone]) on 03/05/2016 4:17:40 PM     CBC WITH AUTOMATED DIFF    Collection Time: 03/05/16 11:02 AM   Result Value Ref Range    WBC 5.0 4.6 - 13.2 K/uL    RBC 4.73 4.70 - 5.50 M/uL    HGB 13.2 13.0 - 16.0 g/dL    HCT 39.2 36.0 - 48.0 %  MCV 82.9 74.0 - 97.0 FL    MCH 27.9 24.0 - 34.0 PG    MCHC 33.7 31.0 - 37.0 g/dL    RDW 13.9 11.6 - 14.5 %    PLATELET 242 135 - 420 K/uL    MPV 10.4 9.2 - 11.8 FL    NEUTROPHILS 38 (L) 40 - 73 %    LYMPHOCYTES 48 21 - 52 %    MONOCYTES 12 (H) 3 - 10 %    EOSINOPHILS 2 0 - 5 %    BASOPHILS 0 0 - 2 %    ABS. NEUTROPHILS 1.9 1.8 - 8.0 K/UL    ABS. LYMPHOCYTES 2.5 0.9 - 3.6 K/UL    ABS. MONOCYTES 0.6 0.05 - 1.2 K/UL    ABS. EOSINOPHILS 0.1 0.0 - 0.4 K/UL    ABS. BASOPHILS 0.0 0.0 - 0.06 K/UL    DF AUTOMATED     METABOLIC PANEL, COMPREHENSIVE    Collection Time: 03/05/16 11:02 AM   Result Value Ref Range    Sodium 135 (L) 136 - 145 mmol/L    Potassium 4.3 3.5 - 5.5 mmol/L    Chloride 101 100 - 108 mmol/L    CO2 30 21 - 32 mmol/L    Anion gap 4 3.0 - 18 mmol/L    Glucose 94 74 - 99 mg/dL    BUN 7 7.0 - 18 MG/DL    Creatinine 0.88 0.6 - 1.3 MG/DL    BUN/Creatinine ratio 8 (L) 12 - 20      GFR est AA >60 >60 ml/min/1.37m    GFR est non-AA >60 >60 ml/min/1.720m   Calcium 9.1 8.5 - 10.1 MG/DL    Bilirubin, total 0.3 0.2 - 1.0 MG/DL    ALT (SGPT) 30 16 - 61 U/L    AST (SGOT) 17 15 - 37 U/L    Alk. phosphatase 96 45 - 117 U/L    Protein, total 8.8 (H) 6.4 - 8.2 g/dL    Albumin 3.5 3.4 - 5.0 g/dL    Globulin 5.3 (H) 2.0 - 4.0 g/dL    A-G Ratio 0.7 (L) 0.8 - 1.7     PROTHROMBIN TIME + INR     Collection Time: 03/05/16 11:02 AM   Result Value Ref Range    Prothrombin time 12.0 11.5 - 15.2 sec    INR 0.9 0.8 - 1.2     CARDIAC PANEL,(CK, CKMB & TROPONIN)    Collection Time: 03/05/16 11:02 AM   Result Value Ref Range    CK 148 39 - 308 U/L    CK - MB 1.6 <3.6 ng/ml    CK-MB Index 1.1 0.0 - 4.0 %    Troponin-I, Qt. 0.49 (H) 0.0 - 0.045 NG/ML   D DIMER    Collection Time: 03/05/16 11:02 AM   Result Value Ref Range    D DIMER 0.91 (H) <0.46 ug/ml(FEU)   CBC WITH AUTOMATED DIFF    Collection Time: 03/05/16 11:02 AM   Result Value Ref Range    WBC DUPLICATE REQUEST K/uL    RBC DUPLICATE REQUEST M/uL    HGB DUPLICATE REQUEST g/dL    HCT DUPLICATE REQUEST %    MCV DUPLICATE REQUEST FL    MCH DUPLICATE REQUEST PG    MCHC DUPLICATE REQUEST g/dL    RDW DUPLICATE REQUEST %    PLATELET DUPLICATE REQUEST K/uL    MPV DUPLICATE REQUEST FL    NEUTROPHILS PENDING %    LYMPHOCYTES PENDING %  MONOCYTES PENDING %    EOSINOPHILS PENDING %    BASOPHILS PENDING %    ABS. NEUTROPHILS PENDING K/UL    ABS. LYMPHOCYTES PENDING K/UL    ABS. MONOCYTES PENDING K/UL    ABS. EOSINOPHILS PENDING K/UL    ABS. BASOPHILS PENDING K/UL    DF PENDING    PTT    Collection Time: 03/05/16 11:02 AM   Result Value Ref Range    aPTT 31.5 23.0 - 36.4 SEC   EKG, 12 LEAD, INITIAL    Collection Time: 03/05/16 11:30 AM   Result Value Ref Range    Ventricular Rate 77 BPM    Atrial Rate 77 BPM    P-R Interval 140 ms    QRS Duration 90 ms    Q-T Interval 368 ms    QTC Calculation (Bezet) 416 ms    Calculated P Axis 57 degrees    Calculated R Axis 59 degrees    Calculated T Axis 58 degrees    Diagnosis       Normal sinus rhythm with sinus arrhythmia  Early repolarization  Normal ECG  When compared with ECG of 05-Mar-2016 10:58,  No significant change was found  Confirmed by Lutricia Feil, MD, Altamese Dilling [phone]) on 03/05/2016 4:17:47 PM     EKG, 12 LEAD, SUBSEQUENT    Collection Time: 03/05/16 11:51 AM   Result Value Ref Range    Ventricular Rate 77 BPM     Atrial Rate 77 BPM    P-R Interval 142 ms    QRS Duration 82 ms    Q-T Interval 364 ms    QTC Calculation (Bezet) 411 ms    Calculated P Axis 56 degrees    Calculated R Axis 59 degrees    Calculated T Axis 59 degrees    Diagnosis       Normal sinus rhythm  Early repolarization  Normal ECG  When compared with ECG of 05-Mar-2016 11:30,  No significant change was found  Confirmed by Lutricia Feil, MD, Altamese Dilling [phone]) on 03/05/2016 4:20:24 PM     CARDIAC PANEL,(CK, CKMB & TROPONIN)    Collection Time: 03/05/16 12:35 PM   Result Value Ref Range    CK 168 39 - 308 U/L    CK - MB 1.8 <3.6 ng/ml    CK-MB Index 1.1 0.0 - 4.0 %    Troponin-I, Qt. 0.50 (H) 0.0 - 0.045 NG/ML   SED RATE (ESR)    Collection Time: 03/05/16 12:35 PM   Result Value Ref Range    Sed rate, automated 28 (H) 0 - 15 mm/hr   EKG, 12 LEAD, SUBSEQUENT    Collection Time: 03/05/16  1:21 PM   Result Value Ref Range    Ventricular Rate 72 BPM    Atrial Rate 72 BPM    P-R Interval 140 ms    QRS Duration 90 ms    Q-T Interval 386 ms    QTC Calculation (Bezet) 422 ms    Calculated P Axis 57 degrees    Calculated R Axis 53 degrees    Calculated T Axis 57 degrees    Diagnosis       Sinus rhythm with marked sinus arrhythmia  Otherwise normal ECG  When compared with ECG of 05-Mar-2016 11:51,  No significant change was found  Confirmed by Lutricia Feil, MD, Altamese Dilling [phone]) on 03/05/2016 4:26:19 PM     EKG, 12 LEAD, SUBSEQUENT    Collection Time: 03/05/16  5:56 PM   Result Value Ref Range  Ventricular Rate 71 BPM    Atrial Rate 71 BPM    P-R Interval 146 ms    QRS Duration 98 ms    Q-T Interval 394 ms    QTC Calculation (Bezet) 428 ms    Calculated P Axis 59 degrees    Calculated R Axis 57 degrees    Calculated T Axis 52 degrees    Diagnosis       Sinus rhythm with marked sinus arrhythmia  Otherwise normal ECG  When compared with ECG of 05-Mar-2016 13:21,  No significant change was found     CARDIAC PANEL,(CK, CKMB & TROPONIN)    Collection Time: 03/05/16  5:57 PM    Result Value Ref Range    CK 326 (H) 39 - 308 U/L    CK - MB 12.5 (H) <3.6 ng/ml    CK-MB Index 3.8 0.0 - 4.0 %    Troponin-I, Qt. 2.48 (HH) 0.0 - 0.045 NG/ML   PTT    Collection Time: 03/05/16  7:45 PM   Result Value Ref Range    aPTT 144.9 (H) 23.0 - 36.4 SEC   PTT    Collection Time: 03/05/16 10:30 PM   Result Value Ref Range    aPTT 64.0 (H) 23.0 - 36.4 SEC   PTT    Collection Time: 03/06/16  3:40 AM   Result Value Ref Range    aPTT 88.8 (H) 23.0 - 36.4 SEC       Signed By: Chase Picket, PA-C     March 06, 2016 10:04 AM

## 2016-03-06 NOTE — Progress Notes (Signed)
Chaplain conducted an initial consultation and Spiritual Assessment for William Griffin, who is a 35 y.o.,male. Patient???s Primary Language is: [place].   According to the patient???s EMR Religious Affiliation is: No religion.     The reason the Patient came to the hospital is:   Patient Active Problem List    Diagnosis Date Noted   ??? GERD (gastroesophageal reflux disease) 03/06/2016   ??? NSTEMI (non-ST elevated myocardial infarction) (HCC) 03/05/2016   ??? Influenza vaccination declined 01/08/2016   ??? Pre-diabetes 01/08/2016   ??? Peripheral vascular disease (HCC) 08/20/2015   ??? History of amputation of lesser toe of left foot (HCC) 08/20/2015   ??? Neuropathic pain 08/20/2015   ??? Elevated BP without diagnosis of hypertension 08/20/2015   ??? Therapeutic drug monitoring 02/06/2014   ??? Sciatica 04/04/2013        The Chaplain provided the following Interventions:  Initiated a relationship of care and support.   Explored issues of faith, belief, spirituality and religious/ritual needs while hospitalized.  Listened empathically.  Provided chaplaincy education.  Provided information about Spiritual Care Services.  Offered prayer and assurance of continued prayers on patient's behalf.   Chart reviewed.    The following outcomes where achieved:  Patient shared limited information about both their medical narrative and spiritual journey/beliefs.  Chaplain confirmed Patient's Religious Affiliation.  Patient processed feeling about current hospitalization.  Patient expressed gratitude for chaplain's visit.    Assessment:  Patient does not have any religious/cultural needs that will affect patient???s preferences in health care.  There are no spiritual or religious issues which require intervention at this time.     Plan:  Chaplains will continue to follow and will provide pastoral care on an as needed/requested basis.  Chaplain recommends bedside caregivers page chaplain on duty if patient  shows signs of acute spiritual or emotional distress.    Jackelyn Polingatricia Simms  [facility] Center[facility]plain  Spiritual Care  [phone]

## 2016-03-06 NOTE — Progress Notes (Addendum)
Pt arrived to floor from cath lab accompanied by staff. Placed on telemetry monitor.  VSS.  Sr on the monitor.  Heparin gtt already infusing.  MD in to see patient.  TR band in place with small amount of blood,  Will release per protocol.

## 2016-03-06 NOTE — Progress Notes (Addendum)
[facility]  -  Progress Note      Patient: William Griffin MRN: [id number]  SSN: [id number]    Date of Birth: Feb 11, 1981  Age: 35 y.o.  Sex: male      Admit Date: 03/05/2016     The patient was seen, examined, and independently evaluated and I agree with the below assessment and plan by Paulo Fruitenee M King, PA-C with the following comments. The patient is still having chest pain and poorly controlled hypertension and will give IV Lopressor and increase dose to 50 mg orally twice daily. If his pain persists will start IV NTG drip. Will also consider adding an ACE inhibitor or amlodipine pending his course. Suspect he has high LVEDP in setting of recent MI and uncontrolled hypertension as the etiology of his persistent chest pain.       Assessment:     -NSTEMI from OM2 distal occlusion but plan is to continue medical management. Because of tortuosity and distal vessel being no more than 2 mm vessel, plan to continue medical management.  -PAD, moderate left leg, recently evaluated by vascular team, no plans for angiography at this time.  -Pre-diabetes, not on medications, although metformin on his home medication list.  -Chronic pain with h/o mainly left leg neuropathy with foot drop.  -H/o elevated blood pressure, not yet started on medications, elevated on admission.  -H/o borderline cholesterol, not yet started on medications.  -H/o left second toe amputation following infection several months ago.  -H/o tobacco use.  ??  No previous cardiologist. [facility]    Plan:     -Continue medical management with DAPT (ASA/Plavix), Lipitor, Isordil, Lopressor.  -Continue Heparin gtt for total of 48 hours.    Subjective:     Still complaining of left chest and left arm discomfort which is somewhat less than prior to cath, but persisting.    Objective:      Patient Vitals for the past 8 hrs:   Temp Pulse Resp BP SpO2   03/06/16 0437 97.6 ??F (36.4 ??C) 78 22 (!) 155/111 97 %    03/05/16 2346 97.4 ??F (36.3 ??C) 82 20 (!) 169/114 100 %         Patient Vitals for the past 96 hrs:   Weight   03/06/16 0625 154 lb 11.2 oz (70.2 kg)   03/05/16 1055 150 lb (68 kg)         Intake/Output Summary (Last 24 hours) at 03/06/16 0724  Last data filed at 03/06/16 0437   Gross per 24 hour   Intake              315 ml   Output             1600 ml   Net            -1285 ml       Physical Exam:  General:  alert, cooperative, no distress, appears stated age  Neck:  nontender  Lungs:  clear to auscultation bilaterally  Heart:  regular rate and rhythm, S1, S2 normal, no murmur, click, rub or gallop  Abdomen:  abdomen is soft without significant tenderness, masses, organomegaly or guarding  Extremities:  extremities normal, atraumatic, no cyanosis or edema    Data Review:     Labs: Results:       Chemistry Recent Labs      03/05/16   1102   GLU  94   NA  135*   K  4.3   CL  101   CO2  30   BUN  7   CREA  0.88   CA  9.1   AGAP  4   BUCR  8*   AP  96   TP  8.8*   ALB  3.5   GLOB  5.3*   AGRAT  0.7*      CBC w/Diff Recent Labs      03/05/16   1102   WBC  DUPLICATE REQUEST   5.0   RBC  DUPLICATE REQUEST   4.73   HGB  DUPLICATE REQUEST   13.2   HCT  DUPLICATE REQUEST   39.2   PLT  DUPLICATE REQUEST   242   GRANS  PENDING   38*   LYMPH  PENDING   48   EOS  PENDING   2      Cardiac Enzymes Lab Results   Component Value Date/Time    CPK 326 (H) 03/05/2016 05:57 PM    CPK 168 03/05/2016 12:35 PM    CPK 148 03/05/2016 11:02 AM    CKMB 12.5 (H) 03/05/2016 05:57 PM    CKMB 1.8 03/05/2016 12:35 PM    CKMB 1.6 03/05/2016 11:02 AM    CKND1 3.8 03/05/2016 05:57 PM    CKND1 1.1 03/05/2016 12:35 PM    CKND1 1.1 03/05/2016 11:02 AM    TROIQ 2.48 (HH) 03/05/2016 05:57 PM    TROIQ 0.50 (H) 03/05/2016 12:35 PM    TROIQ 0.49 (H) 03/05/2016 11:02 AM      Coagulation Recent Labs      03/06/16   0340  03/05/16   2230   03/05/16   1102   PTP   --    --    --   12.0   INR   --    --    --   0.9   APTT  88.8*  64.0*   < >  31.5     < > = values in this interval not displayed.       Lipid Panel Lab Results   Component Value Date/Time    Cholesterol, total 162 01/08/2016 07:59 AM    HDL Cholesterol 39 01/08/2016 07:59 AM    LDL, calculated 109.4 01/08/2016 07:59 AM    VLDL, calculated 13.6 01/08/2016 07:59 AM    Triglyceride 68 01/08/2016 07:59 AM    CHOL/HDL Ratio 4.2 01/08/2016 07:59 AM      Liver Enzymes Recent Labs      03/05/16   1102   TP  8.8*   ALB  3.5   AP  96   SGOT  17      Thyroid Studies Lab Results   Component Value Date/Time    TSH 2.25 01/08/2016 07:59 AM          Trina AoEdward Azra Abrell, DO   March 06, 2016

## 2016-03-07 LAB — EKG 12-LEAD
Atrial Rate: 71 {beats}/min
P Axis: 59 degrees
P-R Interval: 146 ms
Q-T Interval: 394 ms
QRS Duration: 98 ms
QTc Calculation (Bazett): 428 ms
R Axis: 57 degrees
T Axis: 52 degrees
Ventricular Rate: 71 {beats}/min

## 2016-03-07 LAB — EKG, 12 LEAD, SUBSEQUENT
Atrial Rate: 71 {beats}/min
Calculated P Axis: 59 degrees
Calculated R Axis: 57 degrees
Calculated T Axis: 52 degrees
P-R Interval: 146 ms
Q-T Interval: 394 ms
QRS Duration: 98 ms
QTC Calculation (Bezet): 428 ms
Ventricular Rate: 71 {beats}/min

## 2016-03-07 LAB — CBC WITH AUTOMATED DIFF
ABS. BASOPHILS: 0 10*3/uL (ref 0.0–0.06)
ABS. EOSINOPHILS: 0 10*3/uL (ref 0.0–0.4)
ABS. LYMPHOCYTES: 1.9 10*3/uL (ref 0.9–3.6)
ABS. MONOCYTES: 0.7 10*3/uL (ref 0.05–1.2)
ABS. NEUTROPHILS: 4.5 10*3/uL (ref 1.8–8.0)
BASOPHILS: 0 % (ref 0–2)
EOSINOPHILS: 0 % (ref 0–5)
HCT: 40 % (ref 36.0–48.0)
HGB: 13.4 g/dL (ref 13.0–16.0)
LYMPHOCYTES: 27 % (ref 21–52)
MCH: 28.1 PG (ref 24.0–34.0)
MCHC: 33.5 g/dL (ref 31.0–37.0)
MCV: 83.9 FL (ref 74.0–97.0)
MONOCYTES: 10 % (ref 3–10)
MPV: 10.6 FL (ref 9.2–11.8)
NEUTROPHILS: 63 % (ref 40–73)
PLATELET: 255 10*3/uL (ref 135–420)
RBC: 4.77 M/uL (ref 4.70–5.50)
RDW: 14 % (ref 11.6–14.5)
WBC: 7 10*3/uL (ref 4.6–13.2)

## 2016-03-07 LAB — METABOLIC PANEL, BASIC
Anion gap: 6 mmol/L (ref 3.0–18)
BUN/Creatinine ratio: 10 — ABNORMAL LOW (ref 12–20)
BUN: 8 MG/DL (ref 7.0–18)
CO2: 26 mmol/L (ref 21–32)
Calcium: 9 MG/DL (ref 8.5–10.1)
Chloride: 103 mmol/L (ref 100–108)
Creatinine: 0.83 MG/DL (ref 0.6–1.3)
GFR est AA: >60 mL/min/{1.73_m2} (ref >60)
GFR est non-AA: >60 mL/min/{1.73_m2} (ref >60)
Glucose: 90 mg/dL (ref 74–99)
Potassium: 4.6 mmol/L (ref 3.5–5.5)
Sodium: 135 mmol/L — ABNORMAL LOW (ref 136–145)

## 2016-03-07 LAB — GLUCOSE, POC
Glucose (POC): 92 mg/dL (ref 70–110)
Glucose (POC): 80 mg/dL (ref 70–110)
Glucose (POC): 92 mg/dL (ref 70–110)
Glucose (POC): 123 mg/dL — ABNORMAL HIGH (ref 70–110)

## 2016-03-07 LAB — CARDIAC PANEL,(CK, CKMB & TROPONIN)
CK - MB: 12.8 ng/ml — ABNORMAL HIGH (ref <3.6)
CK - MB: 18 ng/ml — ABNORMAL HIGH (ref <3.6)
CK - MB: 30 ng/ml — ABNORMAL HIGH (ref <3.6)
CK-MB Index: 4.2 % — ABNORMAL HIGH (ref 0.0–4.0)
CK-MB Index: 4.6 % — ABNORMAL HIGH (ref 0.0–4.0)
CK-MB Index: 5.1 % — ABNORMAL HIGH (ref 0.0–4.0)
CK: 305 U/L (ref 39–308)
CK: 390 U/L — ABNORMAL HIGH (ref 39–308)
CK: 584 U/L — ABNORMAL HIGH (ref 39–308)
Troponin-I, QT: 19.9 NG/ML — CR (ref 0.0–0.045)
Troponin-I, QT: 7.1 NG/ML — CR (ref 0.0–0.045)
Troponin-I, QT: 9.58 NG/ML — CR (ref 0.0–0.045)

## 2016-03-07 LAB — PTT: aPTT: 32.5 s (ref 23.0–36.4)

## 2016-03-07 MED ORDER — PANTOPRAZOLE 40 MG TAB, DELAYED RELEASE
40 mg | Freq: Every day | ORAL | Status: DC
Start: 2016-03-07 — End: 2016-03-08
  Administered 2016-03-07 – 2016-03-08 (×2): via ORAL

## 2016-03-07 MED ORDER — ISOSORBIDE DINITRATE 10 MG TAB
10 mg | Freq: Three times a day (TID) | ORAL | Status: DC
Start: 2016-03-07 — End: 2016-03-08
  Administered 2016-03-07 – 2016-03-08 (×3): via ORAL

## 2016-03-07 MED ORDER — ENOXAPARIN 40 MG/0.4 ML SUB-Q SYRINGE
40 mg/0.4 mL | SUBCUTANEOUS | Status: DC
Start: 2016-03-07 — End: 2016-03-08
  Administered 2016-03-07 – 2016-03-08 (×2): via SUBCUTANEOUS

## 2016-03-07 MED ORDER — CYANOCOBALAMIN 1,000 MCG TAB
1000 mcg | Freq: Every day | ORAL | Status: DC
Start: 2016-03-07 — End: 2016-03-08
  Administered 2016-03-07 – 2016-03-08 (×2): via ORAL

## 2016-03-07 MED ORDER — PREGABALIN 50 MG CAP
50 mg | Freq: Two times a day (BID) | ORAL | Status: DC
Start: 2016-03-07 — End: 2016-03-08
  Administered 2016-03-07 – 2016-03-08 (×3): via ORAL

## 2016-03-07 MED ORDER — HYDROMORPHONE 0.5 MG/0.5 ML SYRINGE
0.5 mg/ mL | Freq: Four times a day (QID) | INTRAMUSCULAR | Status: DC | PRN
Start: 2016-03-07 — End: 2016-03-08
  Administered 2016-03-07 – 2016-03-08 (×4): via INTRAVENOUS

## 2016-03-07 MED FILL — PANTOPRAZOLE 40 MG TAB, DELAYED RELEASE: 40 mg | ORAL | Qty: 1

## 2016-03-07 MED FILL — HYDROMORPHONE 0.5 MG/0.5 ML SYRINGE: 0.5 mg/ mL | INTRAMUSCULAR | Qty: 0.5

## 2016-03-07 MED FILL — ENOXAPARIN 40 MG/0.4 ML SUB-Q SYRINGE: 40 mg/0.4 mL | SUBCUTANEOUS | Qty: 0.4

## 2016-03-07 MED FILL — VITAMIN B-12  1,000 MCG TABLET: 1000 mcg | ORAL | Qty: 1

## 2016-03-07 MED FILL — OXYCODONE-ACETAMINOPHEN 5 MG-325 MG TAB: 5-325 mg | ORAL | Qty: 1

## 2016-03-07 MED FILL — LYRICA 50 MG CAPSULE: 50 mg | ORAL | Qty: 2

## 2016-03-07 MED FILL — AMLODIPINE 5 MG TAB: 5 mg | ORAL | Qty: 1

## 2016-03-07 MED FILL — ATORVASTATIN 40 MG TAB: 40 mg | ORAL | Qty: 1

## 2016-03-07 MED FILL — ASPIRIN 81 MG CHEWABLE TAB: 81 mg | ORAL | Qty: 1

## 2016-03-07 MED FILL — ISOSORBIDE DINITRATE 10 MG TAB: 10 mg | ORAL | Qty: 1

## 2016-03-07 MED FILL — CLOPIDOGREL 75 MG TAB: 75 mg | ORAL | Qty: 1

## 2016-03-07 MED FILL — METOPROLOL TARTRATE 50 MG TAB: 50 mg | ORAL | Qty: 1

## 2016-03-07 NOTE — Progress Notes (Signed)
[facility]  Progress Note    Patient: William Griffin Age: 35 y.o. DOB: June 13, 1980 MR#: [id number] SSN: [id number]  Date: 03/07/2016     Subjective:     Reports continued improvement in pain to left side of chest with radiation to left arm and left neck.  Denies HA, shortness of breath.      Assessment/Plan:   1. NSTEMI s/p LHC with OM2 distal occlusion: continue medical management per cards. Continue ASA, Lipitor, Plavix, Isordil, metoprolol. Repeat cardiac panel ordered. Has been off heparin since yesterday, ntg drip stopped by cardiology earlier today.  2. Hypertension: continue BP meds  3. Prediabetes: A1c on recheck was 5.9. On SSI, pt was non-compliant with metformin outpatient. Emphasize importance of medication compliance on d/c.   4. Peripheral Neuropathy: continue Lyrica    5. Peripheral Artery Disease - s/p Left 2nd toe amputation, no acute concerns.    Anticipate discharge in AM provided medically stable.  Additional Notes:      Case discussed with:  Patient  Family  Nursing  Case Management  DVT Prophylaxis:  Lovenox  Hep SQ  SCDs  Coumadin   On Heparin gtt    Objective:   VS:   Visit Vitals   ??? BP 115/62   ??? Pulse 72   ??? Temp 97.4 ??F (36.3 ??C)   ??? Resp 18   ??? Wt 70.1 kg (154 lb 9.6 oz)   ??? SpO2 97%   ??? BMI 25.73 kg/m2      Tmax/24hrs: Temp (24hrs), Avg:97.9 ??F (36.6 ??C), Min:97.4 ??F (36.3 ??C), Max:98.9 ??F (37.2 ??C)      Intake/Output Summary (Last 24 hours) at 03/07/16 1351  Last data filed at 03/07/16 0539   Gross per 24 hour   Intake           736.94 ml   Output             2500 ml   Net         -1763.06 ml     General:  Awake, alert, NAD  Cardiovascular:  RRR  Pulmonary:  CTA  GI:  NT, normal bowel sounds  Extremities:  No edema or cyanosis; left foot + 2nd toe amputation  Neuro: oriented x 4    Labs:    Recent Results (from the past 24 hour(s))   GLUCOSE, POC    Collection Time: 03/06/16  3:22 PM   Result Value Ref Range     Glucose (POC) 111 (H) 70 - 110 mg/dL   DRUG SCREEN, URINE    Collection Time: 03/06/16  3:51 PM   Result Value Ref Range    BENZODIAZEPINES NEGATIVE  NEG      BARBITURATES NEGATIVE  NEG      THC (TH-CANNABINOL) NEGATIVE  NEG      OPIATES POSITIVE (A) NEG      PCP(PHENCYCLIDINE) NEGATIVE  NEG      COCAINE NEGATIVE  NEG      AMPHETAMINES NEGATIVE  NEG      METHADONE NEGATIVE  NEG      HDSCOM (NOTE)    CARDIAC PANEL,(CK, CKMB & TROPONIN)    Collection Time: 03/06/16  5:15 PM   Result Value Ref Range    CK 584 (H) 39 - 308 U/L    CK - MB 30.0 (H) <3.6 ng/ml    CK-MB Index 5.1 (H) 0.0 - 4.0 %    Troponin-I, Qt. 19.90 (HH) 0.0 - 0.045  NG/ML   GLUCOSE, POC    Collection Time: 03/06/16  9:36 PM   Result Value Ref Range    Glucose (POC) 92 70 - 110 mg/dL   CBC WITH AUTOMATED DIFF    Collection Time: 03/07/16 12:37 AM   Result Value Ref Range    WBC 7.0 4.6 - 13.2 K/uL    RBC 4.77 4.70 - 5.50 M/uL    HGB 13.4 13.0 - 16.0 g/dL    HCT 16.140.0 09.636.0 - 04.548.0 %    MCV 83.9 74.0 - 97.0 FL    MCH 28.1 24.0 - 34.0 PG    MCHC 33.5 31.0 - 37.0 g/dL    RDW 40.914.0 81.111.6 - 91.414.5 %    PLATELET 255 135 - 420 K/uL    MPV 10.6 9.2 - 11.8 FL    NEUTROPHILS 63 40 - 73 %    LYMPHOCYTES 27 21 - 52 %    MONOCYTES 10 3 - 10 %    EOSINOPHILS 0 0 - 5 %    BASOPHILS 0 0 - 2 %    ABS. NEUTROPHILS 4.5 1.8 - 8.0 K/UL    ABS. LYMPHOCYTES 1.9 0.9 - 3.6 K/UL    ABS. MONOCYTES 0.7 0.05 - 1.2 K/UL    ABS. EOSINOPHILS 0.0 0.0 - 0.4 K/UL    ABS. BASOPHILS 0.0 0.0 - 0.06 K/UL    DF AUTOMATED     METABOLIC PANEL, BASIC    Collection Time: 03/07/16 12:37 AM   Result Value Ref Range    Sodium 135 (L) 136 - 145 mmol/L    Potassium 4.6 3.5 - 5.5 mmol/L    Chloride 103 100 - 108 mmol/L    CO2 26 21 - 32 mmol/L    Anion gap 6 3.0 - 18 mmol/L    Glucose 90 74 - 99 mg/dL    BUN 8 7.0 - 18 MG/DL    Creatinine 7.820.83 0.6 - 1.3 MG/DL    BUN/Creatinine ratio 10 (L) 12 - 20      GFR est AA >60 >60 ml/min/1.2673m2    GFR est non-AA >60 >60 ml/min/1.773m2    Calcium 9.0 8.5 - 10.1 MG/DL    CARDIAC PANEL,(CK, CKMB & TROPONIN)    Collection Time: 03/07/16  6:38 AM   Result Value Ref Range    CK 305 39 - 308 U/L    CK - MB 12.8 (H) <3.6 ng/ml    CK-MB Index 4.2 (H) 0.0 - 4.0 %    Troponin-I, Qt. 7.10 (HH) 0.0 - 0.045 NG/ML   PTT    Collection Time: 03/07/16  6:38 AM   Result Value Ref Range    aPTT 32.5 23.0 - 36.4 SEC   GLUCOSE, POC    Collection Time: 03/07/16  7:37 AM   Result Value Ref Range    Glucose (POC) 80 70 - 110 mg/dL   GLUCOSE, POC    Collection Time: 03/07/16 11:41 AM   Result Value Ref Range    Glucose (POC) 92 70 - 110 mg/dL       Signed By: Claudius SisKathleen B Germaine Shenker, NP     March 07, 2016 10:04 AM

## 2016-03-07 NOTE — Progress Notes (Addendum)
[facility] - Progress Note  Admit Date: 03/05/2016    Assessment:     -NSTEMI, troponin peak 21 from OM2 distal occlusion but plan is to continue medical management. Because of tortuosity and distal vessel being no more than 2 mm vessel, plan to continue medical management.  -EF 60% by echo this admission  -PAD, moderate left leg, recently evaluated by vascular team, no plans for angiography at this time.  -Pre-diabetes, not on medications, although metformin on his home medication list.  -Chronic pain with h/o mainly left leg neuropathy with foot drop.  -H/o elevated blood pressure, not yet started on medications, elevated on admission.  -H/o borderline cholesterol, not yet started on medications.  -H/o left second toe amputation following infection several months ago.  -H/o tobacco use.  ????  No previous cardiologist. [facility]    Plan:     I will stop nitro drip, start isordil, uptitrate norvasc as needed.  Try to limit narcotics for pain.      Subjective:     Still with mild chest pain, left sided, different from initial MI pain.  Also with headache from NTG.  Getting narcotics for pain control.    Objective:      Patient Vitals for the past 8 hrs:   Temp Pulse Resp BP SpO2   03/07/16 0832 97.7 ??F (36.5 ??C) (!) 58 16 (!) 141/91 97 %   03/07/16 0507 98.9 ??F (37.2 ??C) 60 18 149/60 97 %         Patient Vitals for the past 96 hrs:   Weight   03/07/16 0507 70.1 kg (154 lb 9.6 oz)   03/06/16 0625 70.2 kg (154 lb 11.2 oz)   03/05/16 1055 68 kg (150 lb)                    Intake/Output Summary (Last 24 hours) at 03/07/16 1128  Last data filed at 03/07/16 0539   Gross per 24 hour   Intake           736.94 ml   Output             2500 ml   Net         -1763.06 ml       Physical Exam:  General:  alert, cooperative, no distress, appears stated age  Neck:  nontender  Lungs:  clear to auscultation bilaterally  Heart:  regular rate and rhythm, S1, S2 normal, no murmur, click, rub or gallop   Abdomen:  abdomen is soft without significant tenderness, masses, organomegaly or guarding  Extremities:  extremities normal, atraumatic, no cyanosis or edema    Data Review:     Labs: Results:       Chemistry Recent Labs      03/07/16   0037  03/05/16   1102   GLU  90  94   NA  135*  135*   K  4.6  4.3   CL  103  101   CO2  26  30   BUN  8  7   CREA  0.83  0.88   CA  9.0  9.1   AGAP  6  4   BUCR  10*  8*   AP   --   96   TP   --   8.8*   ALB   --   3.5   GLOB   --   5.3*   AGRAT   --  0.7*      CBC w/Diff Recent Labs      03/07/16   0037  03/05/16   1102   WBC  7.0  DUPLICATE REQUEST   5.0   RBC  4.77  DUPLICATE REQUEST   4.73   HGB  13.4  DUPLICATE REQUEST   13.2   HCT  40.0  DUPLICATE REQUEST   39.2   PLT  255  DUPLICATE REQUEST   242   GRANS  63  PENDING   38*   LYMPH  27  PENDING   48   EOS  0  PENDING   2      Cardiac Enzymes Lab Results   Component Value Date/Time    CPK 305 03/07/2016 06:38 AM    CPK 584 (H) 03/06/2016 05:15 PM    CKMB 12.8 (H) 03/07/2016 06:38 AM    CKMB 30.0 (H) 03/06/2016 05:15 PM    CKND1 4.2 (H) 03/07/2016 06:38 AM    CKND1 5.1 (H) 03/06/2016 05:15 PM    TROIQ 7.10 (HH) 03/07/2016 06:38 AM    TROIQ 19.90 (HH) 03/06/2016 05:15 PM      Coagulation Recent Labs      03/07/16   [id number]  03/06/16   1045   03/05/16   1102   PTP   --    --    --   12.0   INR   --    --    --   0.9   APTT  32.5  89.6*   < >  31.5    < > = values in this interval not displayed.       Lipid Panel Lab Results   Component Value Date/Time    Cholesterol, total 167 03/06/2016 10:45 AM    HDL Cholesterol 56 03/06/2016 10:45 AM    LDL, calculated 91.4 03/06/2016 10:45 AM    VLDL, calculated 19.6 03/06/2016 10:45 AM    Triglyceride 98 03/06/2016 10:45 AM    CHOL/HDL Ratio 3.0 03/06/2016 10:45 AM      BNP No results found for: BNP, BNPP, XBNPT   Liver Enzymes Recent Labs      03/05/16   1102   TP  8.8*   ALB  3.5   AP  96   SGOT  17      Digoxin    Thyroid Studies Lab Results   Component Value Date/Time     TSH 2.25 01/08/2016 07:59 AM          Signed By: Arletha Piliyan N Arushi Partridge, MD     March 07, 2016

## 2016-03-07 NOTE — Other (Signed)
Diabetes Patient/Family Education Record    Factors That  May Influence Patients Ability  to Learn or  Comply with Recommendations      Language barrier       Cultural needs      Motivation       Cognitive limitation       Physical      Education       Physiological factors      Hearing/vision/speaking impairment      Religious beliefs       Financial factors     Other:     No factors identified at this time.     Person Instructed:      Patient      Family     Other     Preference for Learning:      Verbal      Written     Demonstration     Level of Comprehension & Competence:      Good                                       Fair                                       Poor                               Needs Reinforcement     Teachback completed    Education Component: Patient reported family history of diabetes and he now experiencing neuropathy (feet).     Medication management, including how to administer insulin (if appropriate) and potential medication interactions: Patient stated that he was prescribed to take Metformin XR 500 mg daiy with dinner.  Noted report that patient was not compliant in taking medication.  Educated patient about the importance of taking diabetes medication and he verbalized understanding.      Nutritional management: Educated patient about the plate method for serving size/portion control of carbs (starches, fruits, dairy) and limit intake of concentrated sweets (regular beverages and food/snacks containing a lot of sugar). Encouraged patient to eat 3 meals daily (breakfast, lunch, and dinner).      Exercise: Patient stated that he's able to tolerate walking exercise when not feeling sick.     Signs, symptoms, and treatment of hyperglycemia and hypoglycemia    Prevention, recognition and treatment of hyperglycemia and hypoglycemia     Importance of blood glucose monitoring and how to obtain a blood glucose  meter: Patient stated that he will monitor blood sugar at home.  Educated patient:  Fasting blood sugar range: 80130 mg/dL.  Random two hours after meal: < 180 mg/dL.      Instruction on use of the blood glucose meter: Completed meter training using [employer].     Discuss the importance of HbA1C monitoring: Discussed current A1c of 5.9% (03/06/2016) with patient which is equivalent to average blood glucose of 123 mg/dL during the past 2-3 months. Encouraged patient to follow diabetes treatment plan: med, nutrition, and recommended regular exercise by his medical provider - to help keep A1c <7%. Discussed list of potential long term complications of uncontrolled diabetes. See list below.      Sick day guidelines     Proper use and  disposal of lancets, needles, syringes or insulin pens (if appropriate)     Potential long-term complications (retinopathy, kidney disease, neuropathy, foot care)    Information about whom to contact in case of emergency or for more information      Goal:  Patient/family will demonstrate understanding of Diabetes Self Management Skills by: 03/14/2016  Plan for post-discharge education or self-management support:     Outpatient class schedule provided             Patient Declined     Scheduled for outpatient classes (date) _______         Sharyne RichtersArnel Rodriguez, RN CCM

## 2016-03-07 NOTE — Other (Signed)
Glycemic Control Plan of Care    POC BG range on 03/06/2016: 92-111 mg/dL.  POC BG report on 03/07/2016 at time of review: 80, 92 mg/dL.    Noted report that patient was not compliant in taking prescribed Metformin. Patient stated that he is already experiencing neuropathy lower extremity (feet) and family history of diabetes.    Recommendation(s):  1.) Continue BG monitoring and correctional insulin as ordered while inpatient.  2.) Complete diabetes education. Done, see separate notes 03/07/2016. Included meter instruction. Patient also accepted diabetes class schedule and agreed to attend classes.    Assessment:  Patient is 35 year old with past medical history including pre-diabetes with prior A1c report of 6.2% (H&P notes),neuropathy, asthma, arterial insufficiency, and chronic pain - was admitted on 03/05/2016 with c/o heavy pressure left sided chest pain radiated to left arm.    Noted:  NSTEMI. Status post cardiac catheterization on 03/05/2016 and plan for medical management.  Pre diabetes. Noted report that patient was not compliant in taking prescribed Metformin.    Most recent blood glucose values:    Results for Bretta BangCLYBURN, Barnabas L (MRN [id number]) as of 03/07/2016 11:54   Ref. Range 03/06/2016 11:19 03/06/2016 15:22 03/06/2016 21:36   GLUCOSE,FAST - POC Latest Ref Range: 70 - 110 mg/dL 119103 147111 (H) 92     Results for Bretta BangCLYBURN, Dutch L (MRN [id number]) as of 03/07/2016 11:54   Ref. Range 03/07/2016 07:37 03/07/2016 11:41   GLUCOSE,FAST - POC Latest Ref Range: 70 - 110 mg/dL 80 92     Current Q6VA1C: 5.9% (03/06/2016) which is equivalent to average blood glucose of 123 mg/dL during the past 2-3 months.    Current hospital diabetes medications:  Correctional lispro insulin ACHS. Normal sensitivity dose.    Total daily dose insulin requirement previous day: 03/06/2016  None.    Home diabetes medications: Noted report that patient was not compliant in taking Metformin XR 500 mg daily with dinner. Educated patient and  encouraged treatment compliance.    Diet: Diabetic consistent carb regular.    Goals:  Blood glucose will be within target range of 70-180 mg/dL by 78/46/962911/27/2017.    Education:  _X__  Refer to Diabetes Education Record: 03/07/2016             ___  Education not indicated at this time    Sharyne RichtersArnel Rodriguez, RN CCM

## 2016-03-08 ENCOUNTER — Inpatient Hospital Stay: Admit: 2016-03-08 | Payer: Self-pay | Primary: Family

## 2016-03-08 LAB — GLUCOSE, POC
Glucose (POC): 101 mg/dL (ref 70–110)
Glucose (POC): 121 mg/dL — ABNORMAL HIGH (ref 70–110)

## 2016-03-08 MED ORDER — OTHER
0 refills | Status: DC
Start: 2016-03-08 — End: 2016-05-08

## 2016-03-08 MED ORDER — METOPROLOL TARTRATE 50 MG TAB
50 mg | ORAL_TABLET | Freq: Three times a day (TID) | ORAL | 0 refills | Status: DC
Start: 2016-03-08 — End: 2016-03-20

## 2016-03-08 MED ORDER — NITROGLYCERIN 0.4 MG SUBLINGUAL TAB
0.4 mg | ORAL_TABLET | SUBLINGUAL | 0 refills | Status: DC | PRN
Start: 2016-03-08 — End: 2016-05-08

## 2016-03-08 MED ORDER — BUTALBITAL-ACETAMINOPHEN-CAFFEINE 50 MG-300 MG-40 MG CAPSULE
50-300-40 mg | ORAL_CAPSULE | Freq: Four times a day (QID) | ORAL | 0 refills | Status: DC | PRN
Start: 2016-03-08 — End: 2016-05-08

## 2016-03-08 MED ORDER — ISOSORBIDE DINITRATE 10 MG TAB
10 mg | ORAL_TABLET | Freq: Three times a day (TID) | ORAL | 0 refills | Status: DC
Start: 2016-03-08 — End: 2016-03-20

## 2016-03-08 MED ORDER — ATORVASTATIN 40 MG TAB
40 mg | ORAL_TABLET | Freq: Every evening | ORAL | 0 refills | Status: DC
Start: 2016-03-08 — End: 2016-03-20

## 2016-03-08 MED ORDER — PANTOPRAZOLE 40 MG TAB, DELAYED RELEASE
40 mg | ORAL_TABLET | Freq: Every day | ORAL | 0 refills | Status: AC
Start: 2016-03-08 — End: ?

## 2016-03-08 MED ORDER — ASPIRIN 81 MG CHEWABLE TAB
81 mg | ORAL_TABLET | Freq: Every day | ORAL | 0 refills | Status: DC
Start: 2016-03-08 — End: 2016-05-08

## 2016-03-08 MED ORDER — CLOPIDOGREL 75 MG TAB
75 mg | ORAL_TABLET | Freq: Every day | ORAL | 0 refills | Status: DC
Start: 2016-03-08 — End: 2016-03-20

## 2016-03-08 MED ORDER — AMLODIPINE 5 MG TAB
5 mg | ORAL_TABLET | Freq: Every day | ORAL | 0 refills | Status: DC
Start: 2016-03-08 — End: 2016-03-20

## 2016-03-08 MED ORDER — BUTALBITAL-ACETAMINOPHEN-CAFFEINE 50 MG-300 MG-40 MG CAPSULE
50-300-40 mg | Freq: Four times a day (QID) | ORAL | Status: DC | PRN
Start: 2016-03-08 — End: 2016-03-08

## 2016-03-08 MED FILL — LYRICA 50 MG CAPSULE: 50 mg | ORAL | Qty: 2

## 2016-03-08 MED FILL — VITAMIN B-12  1,000 MCG TABLET: 1000 mcg | ORAL | Qty: 1

## 2016-03-08 MED FILL — ISOSORBIDE DINITRATE 10 MG TAB: 10 mg | ORAL | Qty: 1

## 2016-03-08 MED FILL — OXYCODONE-ACETAMINOPHEN 5 MG-325 MG TAB: 5-325 mg | ORAL | Qty: 1

## 2016-03-08 MED FILL — METOPROLOL TARTRATE 50 MG TAB: 50 mg | ORAL | Qty: 1

## 2016-03-08 MED FILL — ENOXAPARIN 40 MG/0.4 ML SUB-Q SYRINGE: 40 mg/0.4 mL | SUBCUTANEOUS | Qty: 0.4

## 2016-03-08 MED FILL — CLOPIDOGREL 75 MG TAB: 75 mg | ORAL | Qty: 1

## 2016-03-08 MED FILL — HYDROMORPHONE 0.5 MG/0.5 ML SYRINGE: 0.5 mg/ mL | INTRAMUSCULAR | Qty: 0.5

## 2016-03-08 MED FILL — PANTOPRAZOLE 40 MG TAB, DELAYED RELEASE: 40 mg | ORAL | Qty: 1

## 2016-03-08 MED FILL — ASPIRIN 81 MG CHEWABLE TAB: 81 mg | ORAL | Qty: 1

## 2016-03-08 MED FILL — AMLODIPINE 5 MG TAB: 5 mg | ORAL | Qty: 1

## 2016-03-08 MED FILL — ATORVASTATIN 40 MG TAB: 40 mg | ORAL | Qty: 1

## 2016-03-08 NOTE — Discharge Summary (Signed)
[facility]  Discharge Summary       Patient: William BangBrent Griffin Griffin Age: 35 y.o. DOB: June 28, 1980 MR#: [id number] SSN: [id number]  PCP on record: Manfred ShirtsKristel D Spalding, NP  Admit date: 03/05/2016  Discharge date: 03/10/2016    Disposition:    Home   Home with Home Health   SNF/NH   Rehab   Home with family   Alternate Facility:____________________    Discharge Diagnoses:                             1.  NSTEMI  2.  Headache  3.  Hypertension  4.  Prediabetes  5.  Peripheral neuropathy    Discharge Medications:     Discharge Medication List as of 03/08/2016  2:29 PM      START taking these medications    Details   amLODIPine (NORVASC) 5 mg tablet Take 1 Tab by mouth daily., Print, Disp-30 Tab, R-0      aspirin 81 mg chewable tablet Take 1 Tab by mouth daily., Print, Disp-30 Tab, R-0      atorvastatin (LIPITOR) 40 mg tablet Take 1 Tab by mouth nightly., Print, Disp-30 Tab, R-0      butalbital-acetaminophen-caff (FIORICET) 50-300-40 mg per capsule Take 1 Cap by mouth every six (6) hours as needed for Headache. Max Daily Amount: 4 Caps., Print, Disp-12 Cap, R-0      clopidogrel (PLAVIX) 75 mg tab Take 1 Tab by mouth daily., Print, Disp-30 Tab, R-0      isosorbide dinitrate (ISORDIL) 10 mg tablet Take 1 Tab by mouth three (3) times daily., Print, Disp-90 Tab, R-0      metoprolol tartrate (LOPRESSOR) 50 mg tablet Take 1 Tab by mouth every eight (8) hours., Print, Disp-90 Tab, R-0      pantoprazole (PROTONIX) 40 mg tablet Take 1 Tab by mouth Daily (before breakfast)., Print, Disp-30 Tab, R-0      !! OTHER Check CBC, CMP, Mg in 4 days, results to PCP immediately. Diagnosis- CAD, Print, Disp-1 Each, R-0      !! OTHER This is to certify that William Griffin [facility] and discharged on 03/08/16 , and has been advised to take rest at home for 14 ( fourteen ) more days and then resume work if symptom free., Print, Disp-1 Each, R-0       !! OTHER Diet- Cardiac, no concentrated sweets, Print, Disp-1 Each, R-0      nitroglycerin (NITROSTAT) 0.4 mg SL tablet 1 Tab by SubLINGual route every five (5) minutes as needed for Chest Pain., Print, Disp-20 Tab, R-0       !! - Potential duplicate medications found. Please discuss with provider.      CONTINUE these medications which have NOT CHANGED    Details   cyanocobalamin (VITAMIN B12) 500 mcg tablet Take 1 Tab by mouth daily., No Print, Disp-30 Tab, R-11      pregabalin (LYRICA) 100 mg capsule Take 1 Cap by mouth two (2) times a day. Max Daily Amount: 200 mg. Indications: neuropathic pain, ProgramOrder placed on Behalf of Dr. Harriett SineNancy MorewitzDisp-60 Cap, R-1         STOP taking these medications       cyclobenzaprine (FLEXERIL) 10 mg tablet Comments:   Reason for Stopping:         ibuprofen (MOTRIN) 600 mg tablet Comments:   Reason for Stopping:  gabapentin (NEURONTIN) 300 mg capsule Comments:   Reason for Stopping:         metFORMIN ER (GLUCOPHAGE XR) 500 mg tablet Comments:   Reason for Stopping:             Consults:    - Cardiology    Procedures:  cardiac cath report.  On 03/05/2016  ??  ??  PreOpDiagnosis:   Acute coronary syndrome, NSTEMI  HTN  ??  Findings/PostOp Diagnosis:  1. 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  Plan:  1. ASA, Plavix, IV heparin , BB and statin  ??  Procedures:  * LHC   * LV angiography  * Selective Bilateral Coronary Angiography  * Conscious sedation using versad and fentanyl  ??  Procedure detail:  Risks, benefits, and alternatives were explained to the patient and appropriate informed consent was obtained prior to the procedure. The patient was brought to the cath lab and she was prepped and draped in the usual sterile manner.?? Moderate sedation was achieved with the appropriate medications. Lidocaine was used to secure local anesthesia.   The right radial artery was cannulated using a modified Seldinger  technique and a 5 French sheath was placed.??  LHC was performed using 2F tiger catheter  All catheter was exchanged and advance over 0.035 inch J tip guide wire.  No immediate complication noted.  No major hamatoma or bleeding immediately.  Closure: TR band  Estimated Blood Loss: Minimal  Specimens: None  Assistants: Per [facility]  Complications: None  ??  Conscious sedation : Using versad 1 mg and Fentanyl 25 mcg. Total time ~ 30 mins  ??  LVGram:    EF:  55% without significant wall motion abnormalities    Mitral Regurgitation: No significant    No significant gradient across the aortic valve    LVEDP ~ 12 mm hg  ??  Coronary angiography:  Dominance: Left  LM: Angiographically normal  LAD: Minimal 10% luminal otherwise normal, Large vessel wraps around apex  Diagonal: Large and no obvious obstructive disease  LCX: Large, dominant. Mid 30% plaque  OM1: large, normal  OM2: Medium caliber, tortuous vessel, distal 100% occlusion ( ~ 1.75/2.0 mm vessel at that level)  OM3 and LPDA: Normal  RCA: Small, non-dominant, no obstructive disease  NSTEMI from OM2 distal occlusion but plan is to continue medical management. Because of tortuosity and distal vessel being no more than 2 mm vessel, plan to continue medical management. Risk/benefit ratio favour medical management in my opinion.     Significant Diagnostic Studies:   Transthoracic Echocardiogram on 03/05/2016:  -  SUMMARY:  Left ventricle: Systolic function was normal by EF (biplane method of disks).  ??Ejection fraction was estimated to be 60  %. There were no regional wall motion abnormalities. Wall thickness was at   the upper limits of normal.    CTA CHEST W OR W WO CONT on 03/05/2016:  IMPRESSION:  ????  1.  No convincing evidence of pulmonary embolism.  ??  2.  No acute pulmonary abnormalities.    CT HEAD WO CONT on 03/08/2016:  FINDINGS: No evidence of acute intra-axial or extra-axial hemorrhage.  The   ventricles and sulci are symmetric.  No midline shift, mass effect or mass  lesion appreciated.  The gray-white junction is preserved. No evidence of an  acute infarct identified. The mastoid air cells are well aerated. The paranasal  sinuses are unremarkable. The orbits are normal. The scalp and skull  are  unremarkable.  ??  Head CT on 03/08/2016:  IMPRESSION:  1.   No acute intracranial process identified.    Hospital Course by Problem   1.  NSTEMI - Patient at time of admission reporting left sided chest pain with radiation to left arm and left jaw.  Patient underwent cardiac catheterization upon admission to hospital with findings of occlusion to distal portion of OM2, however due to tortuosity of vessel medical management was recommended.  Patient initially with heparin drip and then with nitroglycerin drip until 11/24.  Continue with current medical management with plan to followup with cardiology in 2 weeks.   2.  Headache - Patient complains of headache, stating present since nitroglycerin drip.  As patient on plavix, head CT was obtained and without acute concern.  Patient discharged with fioricet, discuss further with PCP/cardiology if headaches persist.    3.  Hypertension - Generally well controlled during admission.  Continue current medications with titration of norvasc as needed.    4.  Prediabetes - Review with patient during admission regarding diabetic diet / changes, A1c 5.9 during admission.  Patient controlled with sliding scale only and rarely requiring insulin - may benefit from resumption of metformin in community if dietary non-compliance.    5.  Peripheral neuropathy - Chronic bilateral legs with neuropathy of unknown etiology.  No acute concerns, continue lyrica upon discharge.     Today's examination of the patient revealed:     Subjective:   Patient reports presence of headache since starting on NTG drip, no falls/injury to head. Denies chest pain/arm pain or shortness of breath.    Objective:   VS:   Visit Vitals   ??? BP (!) 142/91   ??? Pulse (!) 56   ??? Temp 98.7 ??F (37.1 ??C)   ??? Resp 20   ??? Wt 70.8 kg (156 lb 1.6 oz)   ??? SpO2 98%   ??? BMI 25.98 kg/m2      Tmax/24hrs: No data recorded.     Input/Output: No intake or output data in the 24 hours ending 03/10/16 2223    General:  Alert, NAD  Cardiovascular:  RRR  Pulmonary:  LSC throughout  GI:  +BS in all four quadrants, soft, non-tender  Extremities:  No edema; 2+ dorsalis pedis pulses bilaterally  Neuro: oriented x 4; PERRLA, strength equal and normal throughout    Labs:    Results for William Griffin, William Griffin (MRN [id number]) as of 03/10/2016 22:45   Ref. Range 03/06/2016 00:37 03/06/2016 10:45 03/06/2016 17:15 03/07/2016 00:37 03/07/2016 06:38   Troponin-I, Qt. Latest Ref Range: 0.0 - 0.045 NG/ML 9.58 (HH) 21.70 (HH) 19.90 (HH)  7.10 (HH)     Additional Data Reviewed:     Condition:   Follow-up Appointments:   F/u with PCP in 1 week, f/u with Cardiology in 2 weeks    >30 minutes spent coordinating this discharge (review instructions/follow-up, prescriptions, preparing report for sign off)    Signed:  Claudius SisKathleen B Farron Lafond, NP  03/10/2016  10:23 PM

## 2016-03-08 NOTE — Progress Notes (Signed)
[facility]  -  Progress Note      Patient: William BangBrent L Ricciuti MRN: [id number]  SSN: [id number]    Date of Birth: [birth year]  Age: 35 y.o.  Sex: male      Admit Date: 03/05/2016    Assessment:     -NSTEMI, troponin peak 21 from OM2 distal occlusion but plan is to continue medical management. Because of tortuosity and distal vessel being no more than 2 mm vessel, plan to continue medical management.  -EF 60% by echo this admission  -PAD, moderate left leg, recently evaluated by vascular team, no plans for angiography at this time.  -Pre-diabetes, not on medications, although metformin on his home medication list.  -Chronic pain with h/o mainly left leg neuropathy with foot drop.  -H/o elevated blood pressure, not yet started on medications, elevated on admission.  -H/o borderline cholesterol, not yet started on medications.  -H/o left second toe amputation following infection several months ago.  -H/o tobacco use.  ????  No previous cardiologist. [facility]      Plan:     1. OK to DC home on current medical therapy.   2. Will f/u in the office in 3-4 weeks.    Subjective:     No new complaints. No further chest pain.     Objective:      Patient Vitals for the past 8 hrs:   Temp Pulse Resp BP SpO2   03/08/16 0800 98.7 ??F (37.1 ??C) (!) 56 20 (!) 142/91 98 %   03/08/16 0439 98.1 ??F (36.7 ??C) (!) 58 18 117/70 98 %         Patient Vitals for the past 96 hrs:   Weight   03/08/16 0439 70.8 kg (156 lb 1.6 oz)   03/07/16 0507 70.1 kg (154 lb 9.6 oz)   03/06/16 0625 70.2 kg (154 lb 11.2 oz)   03/05/16 1055 68 kg (150 lb)         Intake/Output Summary (Last 24 hours) at 03/08/16 1228  Last data filed at 03/08/16 0439   Gross per 24 hour   Intake              440 ml   Output             1200 ml   Net             -760 ml       Physical Exam:  General:  alert, cooperative, no distress, appears stated age  Neck:  no JVD  Lungs:  clear to auscultation bilaterally   Heart:  regular rate and rhythm, S1, S2 normal, no murmur, click, rub or gallop  Abdomen:  abdomen is soft without significant tenderness, masses, organomegaly or guarding  Extremities:  extremities normal, atraumatic, no cyanosis or edema    Data Review:     Labs: Results:       Chemistry Recent Labs      03/07/16   0037   GLU  90   NA  135*   K  4.6   CL  103   CO2  26   BUN  8   CREA  0.83   CA  9.0   AGAP  6   BUCR  10*      CBC w/Diff Recent Labs      03/07/16   0037   WBC  7.0   RBC  4.77   HGB  13.4   HCT  40.0   PLT  255   GRANS  63   LYMPH  27   EOS  0      Cardiac Enzymes No results found for: CPK, CK, CKMMB, CKMB, RCK3, CKMBT, CKNDX, CKND1, MYO, TROPT, TROIQ, TROI, TROPT, TNIPOC, BNP, BNPP   Coagulation Recent Labs      03/07/16   0638  03/06/16   1045   APTT  32.5  89.6*       Lipid Panel Lab Results   Component Value Date/Time    Cholesterol, total 167 03/06/2016 10:45 AM    HDL Cholesterol 56 03/06/2016 10:45 AM    LDL, calculated 91.4 03/06/2016 10:45 AM    VLDL, calculated 19.6 03/06/2016 10:45 AM    Triglyceride 98 03/06/2016 10:45 AM    CHOL/HDL Ratio 3.0 03/06/2016 10:45 AM      BNP No results found for: BNP, BNPP, XBNPT   Liver Enzymes No results for input(s): TP, ALB, TBIL, AP, SGOT, GPT in the last 72 hours.    No lab exists for component: DBIL   Digoxin    Thyroid Studies Lab Results   Component Value Date/Time    TSH 2.25 01/08/2016 07:59 AM          Trina AoEdward Niomie Englert, DO   March 08, 2016

## 2016-03-08 NOTE — Discharge Summary (Signed)
Discharge Summary by Claudius SisBurnett, Halston Kintz B, NP at 03/08/16 1623                Author: Claudius SisBurnett, Cathlyn Tersigni B, NP  Service: Hospitalist  Author Type: Nurse Practitioner       Filed: 03/10/16 2307  Date of Service: 03/08/16 1623  Status: Attested           Editor: Claudius SisBurnett, Atlas Crossland B, NP (Nurse Practitioner)  Cosigner: Jonah Bluehakral, Vikas, MD at 03/11/16 1516          Attestation signed by Jonah Bluehakral, Vikas, MD at 03/11/16 1516          Patient seen and evaluated independently. Home today. D/w patient                                 [facility]   Discharge Summary             Patient: William Griffin  Age: 35 y.o.  DOB: 05/29/1980 MR#:  [id number] SSN:  [id number]   PCP on record: Manfred ShirtsKristel D Spalding, NP   Admit date: 03/05/2016   Discharge date: 03/10/2016        Disposition:      [x] Home   [] Home with Home Health   [] SNF/NH   [] Rehab   [] Home with family    []  Alternate Facility:____________________      Discharge Diagnoses:                              1.  NSTEMI   2.  Headache   3.  Hypertension   4.  Prediabetes   5.  Peripheral neuropathy        Discharge Medications:          Discharge Medication List as of 03/08/2016  2:29 PM              START taking these medications          Details        amLODIPine (NORVASC) 5 mg tablet  Take 1 Tab by mouth daily., Print, Disp-30 Tab, R-0               aspirin 81 mg chewable tablet  Take 1 Tab by mouth daily., Print, Disp-30 Tab, R-0               atorvastatin (LIPITOR) 40 mg tablet  Take 1 Tab by mouth nightly., Print, Disp-30 Tab, R-0               butalbital-acetaminophen-caff (FIORICET) 50-300-40 mg per capsule  Take 1 Cap by mouth every six (6) hours as needed for Headache. Max Daily Amount: 4 Caps., Print, Disp-12 Cap, R-0               clopidogrel (PLAVIX) 75 mg tab  Take 1 Tab by mouth daily., Print, Disp-30 Tab, R-0               isosorbide dinitrate (ISORDIL) 10 mg tablet  Take 1 Tab by mouth three (3) times daily., Print,  Disp-90 Tab, R-0               metoprolol tartrate (LOPRESSOR) 50 mg tablet  Take 1 Tab by mouth every eight (8) hours., Print, Disp-90 Tab, R-0               pantoprazole (PROTONIX)  40 mg tablet  Take 1 Tab by mouth Daily (before breakfast)., Print, Disp-30 Tab, R-0               !! OTHER  Check CBC, CMP, Mg in 4 days, results to PCP immediately. Diagnosis- CAD, Print, Disp-1 Each, R-0               !! OTHER  This is to certify that William Griffin was admitted to [facility] on 03/05/16 and discharged on 03/08/16 , and has been advised to take rest  at home for 14 ( fourteen ) more days and then resume work if symptom free., Print, Disp-1 Each, R-0               !! OTHER  Diet- Cardiac, no concentrated sweets, Print, Disp-1 Each, R-0               nitroglycerin (NITROSTAT) 0.4 mg SL tablet  1 Tab by SubLINGual route every five (5) minutes as needed for Chest Pain., Print, Disp-20 Tab, R-0               !! - Potential duplicate medications found. Please discuss with provider.              CONTINUE these medications which have NOT CHANGED          Details        cyanocobalamin (VITAMIN B12) 500 mcg tablet  Take 1 Tab by mouth daily., No Print, Disp-30 Tab, R-11               pregabalin (LYRICA) 100 mg capsule  Take 1 Cap by mouth two (2) times a day. Max Daily Amount: 200 mg. Indications: neuropathic pain, ProgramOrder placed on Behalf of Dr. Harriett Sine MorewitzDisp-60  Cap, R-1                     STOP taking these medications                  cyclobenzaprine (FLEXERIL) 10 mg tablet  Comments:    Reason for Stopping:                      ibuprofen (MOTRIN) 600 mg tablet  Comments:    Reason for Stopping:                      gabapentin (NEURONTIN) 300 mg capsule  Comments:    Reason for Stopping:                      metFORMIN ER (GLUCOPHAGE XR) 500 mg tablet  Comments:    Reason for Stopping:                          Consults:     - Cardiology      Procedures:   cardiac cath report.  On 03/05/2016   ??   ??    PreOpDiagnosis:    Acute coronary syndrome, NSTEMI   HTN   ??   Findings/PostOp Diagnosis:   1. 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   Plan:   1. ASA, Plavix, IV heparin , BB and statin   ??   Procedures:   * LHC    * LV angiography   * Selective Bilateral Coronary Angiography   * Conscious sedation using versad and fentanyl   ??  Procedure detail:   Risks, benefits, and alternatives were explained to the patient and appropriate informed consent was obtained prior to the procedure. The patient was brought to the cath lab and she was prepped and draped in the usual sterile manner.?? Moderate sedation  was achieved with the appropriate medications. Lidocaine was used to secure local anesthesia.    The right radial artery was cannulated using a modified Seldinger technique and a 5 French sheath was placed.??   LHC was performed using 35F tiger catheter   All catheter was exchanged and advance over 0.035 inch J tip guide wire.   No immediate complication noted.   No major hamatoma or bleeding immediately.   Closure: TR band   Estimated Blood Loss: Minimal   Specimens: None   Assistants: Per [facility]   Complications: None   ??   Conscious sedation : Using versad 1 mg and Fentanyl 25 mcg. Total time ~ 30 mins   ??   LVGram:     EF:  55% without significant wall motion abnormalities     Mitral Regurgitation: No significant     No significant gradient across the aortic valve     LVEDP ~ 12 mm hg   ??   Coronary angiography:   Dominance: Left   LM: Angiographically normal   LAD: Minimal 10% luminal otherwise normal, Large vessel wraps around apex   Diagonal: Large and no obvious obstructive disease   LCX: Large, dominant. Mid 30% plaque   OM1: large, normal   OM2: Medium caliber, tortuous vessel, distal 100% occlusion ( ~ 1.75/2.0 mm vessel at that level)   OM3 and LPDA: Normal   RCA: Small, non-dominant, no obstructive disease   NSTEMI from OM2 distal occlusion but plan is to continue medical  management. Because of tortuosity and distal vessel being no more than 2 mm vessel, plan to continue medical management. Risk/benefit ratio favour medical management in my opinion.       Significant Diagnostic Studies:    Transthoracic Echocardiogram on 03/05/2016:   -  SUMMARY:  Left ventricle: Systolic function was normal by EF (biplane method of disks).  ??Ejection fraction was estimated to be 60  %. There were no regional wall  motion abnormalities. Wall thickness was at   the upper limits of normal.      CTA CHEST W OR W WO CONT on 03/05/2016:   IMPRESSION:   ????   1.  No convincing evidence of pulmonary embolism.   ??   2.  No acute pulmonary abnormalities.      CT HEAD WO CONT on 03/08/2016:   FINDINGS: No evidence of acute intra-axial or extra-axial hemorrhage.  The   ventricles and sulci are symmetric.  No midline shift, mass effect or mass   lesion appreciated.  The gray-white junction is preserved. No evidence of an   acute infarct identified. The mastoid air cells are well aerated. The paranasal   sinuses are unremarkable. The orbits are normal. The scalp and skull are   unremarkable.   ??   Head CT on 03/08/2016:   IMPRESSION:   1.   No acute intracranial process identified.        Hospital Course by Problem     1.  NSTEMI - Patient at time of admission reporting left sided chest pain with radiation to left arm and left jaw.  Patient underwent cardiac catheterization  upon admission to hospital with findings of occlusion to distal portion of OM2, however due  to tortuosity of vessel medical management was recommended.  Patient initially with heparin drip and then with nitroglycerin drip until 11/24.  Continue with current  medical management with plan to followup with cardiology in 2 weeks.    2.  Headache - Patient complains of headache, stating present since nitroglycerin drip.  As patient on plavix, head CT was obtained and without acute concern.  Patient discharged with fioricet, discuss further with  PCP/cardiology if headaches persist.      3.  Hypertension - Generally well controlled during admission.  Continue current medications with titration of norvasc as needed.     4.  Prediabetes - Review with patient during admission regarding diabetic diet / changes, A1c 5.9 during admission.  Patient controlled with sliding scale only and rarely requiring insulin - may benefit from resumption of metformin in community if dietary  non-compliance.     5.  Peripheral neuropathy - Chronic bilateral legs with neuropathy of unknown etiology.  No acute concerns, continue lyrica upon discharge.         Today's examination of the patient revealed:          Subjective:     Patient reports presence of headache since starting on NTG drip, no falls/injury to head. Denies chest pain/arm pain or shortness of breath.      Objective:     VS:      Visit Vitals         ?  BP  (!) 142/91     ?  Pulse  (!) 56     ?  Temp  98.7 ??F (37.1 ??C)     ?  Resp  20     ?  Wt  70.8 kg (156 lb 1.6 oz)     ?  SpO2  98%         ?  BMI  25.98 kg/m2         Tmax/24hrs: No data recorded.       Input/Output: No intake or output data in the 24 hours ending 03/10/16 2223      General:  Alert, NAD   Cardiovascular:  RRR   Pulmonary:  LSC throughout   GI:  +BS in all four quadrants, soft, non-tender   Extremities:  No edema; 2+ dorsalis pedis pulses bilaterally   Neuro: oriented x 4; PERRLA, strength equal and normal throughout      Labs:     Results for William BangCLYBURN, Dysen L (MRN [id number]) as of 03/10/2016 22:45             Ref. Range  03/06/2016 00:37  03/06/2016 10:45  03/06/2016 17:15  03/07/2016 00:37  03/07/2016 06:38     Troponin-I, Qt.  Latest Ref Range: 0.0 - 0.045 NG/ML  9.58 (HH)  21.70 (HH)  19.90 (HH)    7.10 (HH)        Additional Data Reviewed:       Condition:      Follow-up Appointments:     F/u with PCP in 1 week, f/u with Cardiology in 2 weeks      >30 minutes spent coordinating this discharge (review instructions/follow-up, prescriptions,  preparing report for sign off)      Signed:   Claudius SisKathleen B Jr Milliron, NP   03/10/2016   10:23 PM

## 2016-03-13 ENCOUNTER — Encounter: Attending: Physician Assistant | Primary: Family

## 2016-03-19 ENCOUNTER — Inpatient Hospital Stay
Admit: 2016-03-19 | Discharge: 2016-03-19 | Disposition: A | Discharge status: Home / Self Care | Payer: Self-pay | Attending: Emergency Medicine

## 2016-03-19 DIAGNOSIS — I739 Peripheral vascular disease, unspecified: Secondary | ICD-10-CM

## 2016-03-19 MED ORDER — PREGABALIN 50 MG CAP
50 mg | ORAL | Status: AC
Start: 2016-03-19 — End: 2016-03-19
  Administered 2016-03-19: 17:00:00 via ORAL

## 2016-03-19 MED ORDER — GABAPENTIN 300 MG CAP
300 mg | ORAL_CAPSULE | Freq: Three times a day (TID) | ORAL | 0 refills | Status: DC
Start: 2016-03-19 — End: 2016-05-08

## 2016-03-19 MED ORDER — NAPROXEN 500 MG TAB
500 mg | ORAL_TABLET | Freq: Two times a day (BID) | ORAL | 0 refills | Status: AC
Start: 2016-03-19 — End: 2016-03-29

## 2016-03-19 MED ORDER — TRAMADOL 50 MG TAB
50 mg | ORAL | Status: AC
Start: 2016-03-19 — End: 2016-03-19
  Administered 2016-03-19: 17:00:00 via ORAL

## 2016-03-19 MED ORDER — PREGABALIN 100 MG CAP
100 mg | ORAL_CAPSULE | Freq: Two times a day (BID) | ORAL | 0 refills | Status: DC
Start: 2016-03-19 — End: 2016-05-08

## 2016-03-19 MED FILL — TRAMADOL 50 MG TAB: 50 mg | ORAL | Qty: 1

## 2016-03-19 MED FILL — LYRICA 50 MG CAPSULE: 50 mg | ORAL | Qty: 2

## 2016-03-19 NOTE — Telephone Encounter (Signed)
Error

## 2016-03-19 NOTE — Telephone Encounter (Signed)
Patient states that he is unable to received financial assistance with Lyrica. And he also states that he is unable to afford the cost of this medication. Would like to be prescribed a different medication that may be more affordable. Please advise. Patient can be reached at [phone]

## 2016-03-19 NOTE — ED Triage Notes (Signed)
Pt reports severe L leg and L foot neuropathy pain. [facility]; however medication has not come in. [facility] to be seen at ER

## 2016-03-19 NOTE — Progress Notes (Signed)
Do not understand above note [facility]

## 2016-03-19 NOTE — ED Provider Notes (Signed)
EMERGENCY DEPARTMENT HISTORY AND PHYSICAL EXAM    11:35 AM      Date: 03/19/2016  Patient Name: William Griffin    History of Presenting Illness     Chief Complaint   Patient presents with   ??? Peripheral Neuropathy     L leg and L foot         History Provided By: Patient    Chief Complaint: Leg Neuropathy  Duration:    Timing:  Constant  Location: Left leg  Quality: Aching  Severity: 8 out of 10  Modifying Factors: none  Associated Symptoms: a-febrile, denies noticing change in skin color      Additional History (Context): William Griffin is a 35 y.o. male with pre-diabetes, asthma and NSTEMI who presents with c/o constant 8/10 left leg neuropathy.  Pt is a-febrile and states having no feeling in left foot for the last day.  Denies noticing change in skin color of foot.  [facility].  Pt is staying with church family.  Denies BP medication use today.      PCP: Manfred ShirtsKristel D Spalding, NP    Current Outpatient Prescriptions   Medication Sig Dispense Refill   ??? naproxen (NAPROSYN) 500 mg tablet Take 1 Tab by mouth two (2) times daily (with meals) for 10 days. 8 Tab 0   ??? gabapentin (NEURONTIN) 300 mg capsule Take 1 Cap by mouth three (3) times daily. 30 Cap 0   ??? pregabalin (LYRICA) 100 mg capsule Take 1 Cap by mouth two (2) times a day. Max Daily Amount: 200 mg. Indications: neuropathic pain 60 Cap 0   ??? amLODIPine (NORVASC) 5 mg tablet Take 1 Tab by mouth daily. 30 Tab 0   ??? aspirin 81 mg chewable tablet Take 1 Tab by mouth daily. 30 Tab 0   ??? atorvastatin (LIPITOR) 40 mg tablet Take 1 Tab by mouth nightly. 30 Tab 0   ??? butalbital-acetaminophen-caff (FIORICET) 50-300-40 mg per capsule Take 1 Cap by mouth every six (6) hours as needed for Headache. Max Daily Amount: 4 Caps. 12 Cap 0   ??? clopidogrel (PLAVIX) 75 mg tab Take 1 Tab by mouth daily. 30 Tab 0   ??? isosorbide dinitrate (ISORDIL) 10 mg tablet Take 1 Tab by mouth three (3) times daily. 90 Tab 0    ??? metoprolol tartrate (LOPRESSOR) 50 mg tablet Take 1 Tab by mouth every eight (8) hours. 90 Tab 0   ??? pantoprazole (PROTONIX) 40 mg tablet Take 1 Tab by mouth Daily (before breakfast). 30 Tab 0   ??? OTHER Check CBC, CMP, Mg in 4 days, results to PCP immediately. Diagnosis- CAD 1 Each 0   ??? OTHER This is to certify that Coralie CarpenBrent Hollyfield was [facility] and discharged on 03/08/16 , and has been advised to take rest at home for 14 ( fourteen ) more days and then resume work if symptom free. 1 Each 0   ??? OTHER Diet- Cardiac, no concentrated sweets 1 Each 0   ??? nitroglycerin (NITROSTAT) 0.4 mg SL tablet 1 Tab by SubLINGual route every five (5) minutes as needed for Chest Pain. 20 Tab 0   ??? cyanocobalamin (VITAMIN B12) 500 mcg tablet Take 1 Tab by mouth daily. 30 Tab 11   ??? pregabalin (LYRICA) 100 mg capsule Take 1 Cap by mouth two (2) times a day. Max Daily Amount: 200 mg. Indications: neuropathic pain 60 Cap  1       Past History     Past Medical History:  Past Medical History:   Diagnosis Date   ??? Asthma    ??? Chronic pain    ??? Insufficiency, arterial, peripheral (HCC) 11/28/2015    See [facility]   ??? Neuropathy    ??? NSTEMI (non-ST elevated myocardial infarction) (HCC) 02/2016   ??? Pre-diabetes 01/08/2016    A1C 6.2   ??? Sciatica    ??? Vitamin D deficiency        Past Surgical History:  Past Surgical History:   Procedure Laterality Date   ??? 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 HX ORTHOPAEDIC         Family History:  Family History   Problem Relation Age of Onset   ??? Hypertension Mother    ??? Stroke Mother    ??? Hypertension Father    ??? Asthma Sister        Social History:  Social History   Substance Use Topics   ??? Smoking status: Former Smoker     Packs/day: 0.50     Types: Cigarettes   ??? Smokeless tobacco: Never Used      Comment: quit about 5 months ago   ??? Alcohol use No      Comment: occasional "pt stated that he stop drinking"       Allergies:  No Known Allergies       Review of Systems     Review of Systems   Constitutional: Negative for activity change, fatigue and fever.   HENT: Negative for congestion and rhinorrhea.    Eyes: Negative for visual disturbance.   Respiratory: Negative for shortness of breath.    Cardiovascular: Negative for chest pain and palpitations.   Gastrointestinal: Negative for abdominal pain, diarrhea, nausea and vomiting.   Genitourinary: Negative for dysuria and hematuria.   Musculoskeletal: Positive for gait problem. Negative for back pain.   Skin: Negative for rash.   Neurological: Positive for numbness. Negative for dizziness, weakness and light-headedness.   All other systems reviewed and are negative.        Physical Exam     Visit Vitals   ??? BP (!) 159/114 (BP 1 Location: Left arm, BP Patient Position: At rest;Sitting)   ??? Pulse 69   ??? Temp 98.3 ??F (36.8 ??C)   ??? Resp 20   ??? SpO2 100%         Physical Exam   Constitutional: He is oriented to person, place, and time. He appears well-developed and well-nourished. No distress.   Steady gait    HENT:   Head: Normocephalic and atraumatic.   Right Ear: External ear normal.   Left Ear: External ear normal.   Nose: Nose normal.   Mouth/Throat: Oropharynx is clear and moist.   Eyes: Conjunctivae and EOM are normal. Pupils are equal, round, and reactive to light. No scleral icterus.   Neck: Normal range of motion. Neck supple. No JVD present. No tracheal deviation present. No thyromegaly present.   Cardiovascular: Normal rate, regular rhythm, normal heart sounds and intact distal pulses.  Exam reveals no gallop and no friction rub.    No murmur heard.  Pulmonary/Chest: Effort normal and breath sounds normal. He exhibits no tenderness.   Abdominal: Soft. Bowel sounds are normal. He exhibits no distension. There is no tenderness. There is no rebound and no guarding.   Musculoskeletal: Normal range of motion. He  exhibits no edema or tenderness.    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   Neurological: He is alert and oriented to person, place, and time. No cranial nerve deficit. Coordination normal.   Diffuse numbness of the L leg, Gait normal, Motor 5/5   Skin: Skin is warm and dry.   Psychiatric: He has a normal mood and affect. His behavior is normal. Judgment and thought content normal.   Nursing note and vitals reviewed.        Diagnostic Study Results     Labs -  No results found for this or any previous visit (from the past 12 hour(s)).    Radiologic Studies -   No orders to display         Medical Decision Making   I am the first provider for this patient.    I reviewed the vital signs, available nursing notes, past medical history, past surgical history, family history and social history.    Vital Signs-Reviewed the patient's vital signs.    Records Reviewed: Nursing Notes and Old Medical Records (Time of Review: 11:35 AM)    Provider Notes (Medical Decision Making): Pt is a 35yo male with a hx of advanced vascular disease for his age, HTN, chronic neuropathy of the LLE on tramadol with pregabalin presents with L leg pain that is chronic but is out of his tramadol and lyrica.  [facility] and needed care for his pain.  Pt has no acute change and suspect he has chronic claudication and is being followed by vascular sx.  Discussed with him that we cannot fill his tramadol due to our chronic pain policy but will give him an RX for gabapentin as it is only 4 dollars.  Pt has a pulse in his L foot at this time and no acute infection.  Will start a short coarse of NSAID as well, encourage his BP med use (suspect elevation is due to pain), and [facility] in the next 2 days then return if at all worsened or concerned. William Jacobson, William Griffin 11:59 AM      Diagnosis      Clinical Impression:   1. Chronic pain syndrome    2. Neuropathy    3. PAD (peripheral artery disease) (HCC)    4. Elevated blood pressure reading        Disposition: Discharge    Follow-up Information     Follow up With Details Comments Contact Info    [facility] In 2 days For follow up for medication reevaluation [address]  [place] [address]  [phone]    [facility] EMERGENCY DEPT Go to As needed, If symptoms worsen [address] [facility] [address] [facility] [address]  [phone]           Discharge Medication List as of 03/19/2016 11:50 AM      START taking these medications    Details   naproxen (NAPROSYN) 500 mg tablet Take 1 Tab by mouth two (2) times daily (with meals) for 10 days., Print, Disp-8 Tab, R-0      gabapentin (NEURONTIN) 300 mg capsule Take 1 Cap by mouth three (3) times daily., Print, Disp-30 Cap, R-0         CONTINUE these medications which have NOT CHANGED  Details   !! pregabalin (LYRICA) 100 mg capsule Take 1 Cap by mouth two (2) times a day. Max Daily Amount: 200 mg. Indications: neuropathic pain, Print, Disp-60 Cap, R-0      amLODIPine (NORVASC) 5 mg tablet Take 1 Tab by mouth daily., Print, Disp-30 Tab, R-0      aspirin 81 mg chewable tablet Take 1 Tab by mouth daily., Print, Disp-30 Tab, R-0      atorvastatin (LIPITOR) 40 mg tablet Take 1 Tab by mouth nightly., Print, Disp-30 Tab, R-0      butalbital-acetaminophen-caff (FIORICET) 50-300-40 mg per capsule Take 1 Cap by mouth every six (6) hours as needed for Headache. Max Daily Amount: 4 Caps., Print, Disp-12 Cap, R-0      clopidogrel (PLAVIX) 75 mg tab Take 1 Tab by mouth daily., Print, Disp-30 Tab, R-0      isosorbide dinitrate (ISORDIL) 10 mg tablet Take 1 Tab by mouth three (3) times daily., Print, Disp-90 Tab, R-0      metoprolol tartrate (LOPRESSOR) 50 mg tablet Take 1 Tab by mouth every eight (8) hours., Print, Disp-90 Tab, R-0      pantoprazole (PROTONIX) 40 mg tablet Take 1 Tab by mouth Daily (before  breakfast)., Print, Disp-30 Tab, R-0      !! OTHER Check CBC, CMP, Mg in 4 days, results to PCP immediately. Diagnosis- CAD, Print, Disp-1 Each, R-0      !! OTHER This is to certify that Coralie CarpenBrent Beckum was [facility] and discharged on 03/08/16 , and has been advised to take rest at home for 14 ( fourteen ) more days and then resume work if symptom free., Print, Disp-1 Each, R-0      !! OTHER Diet- Cardiac, no concentrated sweets, Print, Disp-1 Each, R-0      nitroglycerin (NITROSTAT) 0.4 mg SL tablet 1 Tab by SubLINGual route every five (5) minutes as needed for Chest Pain., Print, Disp-20 Tab, R-0      cyanocobalamin (VITAMIN B12) 500 mcg tablet Take 1 Tab by mouth daily., No Print, Disp-30 Tab, R-11      !! pregabalin (LYRICA) 100 mg capsule Take 1 Cap by mouth two (2) times a day. Max Daily Amount: 200 mg. Indications: neuropathic pain, ProgramOrder placed on Behalf of Dr. Harriett SineNancy MorewitzDisp-60 Cap, R-1       !! - Potential duplicate medications found. Please discuss with provider.        _______________________________    Attestations:  Scribe Attestation     Abran CantorHailey M Thompson acting as a scribe for and in the presence of William Sorrowaniel I Celester Morgan, William Griffin      March 19, 2016 at 11:35 AM       Provider Attestation:      I personally performed the services described in the documentation, reviewed the documentation, as recorded by the scribe in my presence, and it accurately and completely records my words and actions. March 19, 2016 at 11:35 AM - William Sorrowaniel I Sarena Jezek, William Griffin    _______________________________

## 2016-03-19 NOTE — ED Notes (Signed)
I have reviewed discharge instructions with the patient.  The patient verbalized understanding. Pt ambulated out of ED in stable condition with no distress noted and no complaints voiced. Pt states his cousin will be driving him home

## 2016-03-19 NOTE — Telephone Encounter (Signed)
Routed to Dr. Morewitz for review.

## 2016-03-19 NOTE — Progress Notes (Signed)
Pt given script to take to retail pharmacy for lyrica until it arrives via patient assistance program for his neuropathic pain

## 2016-03-20 ENCOUNTER — Encounter: Admit: 2016-03-20 | Discharge: 2016-03-20 | Attending: Family | Primary: Family

## 2016-03-20 ENCOUNTER — Ambulatory Visit: Admit: 2016-03-20 | Attending: Family | Primary: Family

## 2016-03-20 DIAGNOSIS — I214 Non-ST elevation (NSTEMI) myocardial infarction: Secondary | ICD-10-CM

## 2016-03-20 MED ORDER — AMLODIPINE 5 MG TAB
5 mg | ORAL_TABLET | Freq: Every day | ORAL | 2 refills | Status: DC
Start: 2016-03-20 — End: 2016-05-08

## 2016-03-20 MED ORDER — METOPROLOL TARTRATE 50 MG TAB
50 mg | ORAL_TABLET | Freq: Three times a day (TID) | ORAL | 2 refills | Status: DC
Start: 2016-03-20 — End: 2016-05-08

## 2016-03-20 MED ORDER — ISOSORBIDE DINITRATE 10 MG TAB
10 mg | ORAL_TABLET | Freq: Three times a day (TID) | ORAL | 2 refills | Status: DC
Start: 2016-03-20 — End: 2016-05-08

## 2016-03-20 MED ORDER — ATORVASTATIN 40 MG TAB
40 mg | ORAL_TABLET | Freq: Every evening | ORAL | 2 refills | Status: DC
Start: 2016-03-20 — End: 2016-05-08

## 2016-03-20 MED ORDER — CLOPIDOGREL 75 MG TAB
75 mg | ORAL_TABLET | Freq: Every day | ORAL | 2 refills | Status: DC
Start: 2016-03-20 — End: 2016-05-08

## 2016-03-20 NOTE — Progress Notes (Addendum)
William Griffin resents today for post hospital follow-up.  He presented to the ER on March 05, 2016 with complaints of chest pain.  He states that he was also experiencing left arm and left jaw pain but no nausea, vomiting, or diaphoresis.  The pain was not affected by movement.  His initial troponin was 0.49 and peaked at 21.0.  He was diagnosed with a non-STEMI.  He was brought to the Cath Lab and underwent cardiac catheterization and was found to have in the LAD minimal 10%, left circumflex had a mid 30%, and OM 2 had a distal 100% occlusion.  It was felt that the non-STEMI was from the OM to distal occlusion and continued medical management was recommended due to the tortuosity and the distal vessel being small.  He was discharged home on amlodipine, aspirin 81 mg, atorvastatin, clopidogrel, isosorbide dinitrate, and metoprolol.    He is a 35 year old African-American male with history of hypertension, prediabetes, and peripheral neuropathy.  He states that he recently moved back to [place] from [place] earlier this year.  He is currently unemployed and does not have insurance.  [facility]  He states that he does not have the financial means to refill his medications at this time.  He is trying to get an appointment with [facility] to discuss options for medications with them.    Since being discharged home, he states that he has been feeling okay.  He states that on occasion he does notice that his "heart skips a beat" as well as some pain on the left side of his rib cage.  He has not had any midsternal chest pain, heaviness, tightness.  Denies shortness of breath at rest, dyspnea on exertion, orthopnea and PND.   Denies abdominal bloating.  Denies lightheadedness, dizziness, and syncope.   Denies lower extremity edema and claudication.  Denies nausea, vomiting, diarrhea, melena, hematochezia.  Denies hematuria, urgency, frequency.  Denies  fever, chills.      While reviewing his medications, he told me that he is only taking the isosorbide dinitrate and metoprolol once a day.  He states that he did not realize that it was supposed to be taken 3 times a day.  He also tells me that he was given a prescription for gabapentin to hold him over until he can get the Lyrica from the [facility].  However, he has not picked up the prescription for Neurontin yet.    PMH:  Past Medical History:   Diagnosis Date   ??? Asthma    ??? Chronic pain    ??? 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 Neuropathy    ??? NSTEMI (non-ST elevated myocardial infarction) (HCC) 02/2016   ??? Pre-diabetes 01/08/2016    A1C 6.2   ??? Sciatica    ??? Vitamin D deficiency        PSH:  Past Surgical History:   Procedure Laterality Date   ??? 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 HX HEART CATHETERIZATION  03/05/2016    LAD 10%; LCx mid 30%; OM2 distal 100%) medical management   ??? HX ORTHOPAEDIC         MEDS:  Current Outpatient Prescriptions   Medication Sig   ??? amLODIPine (NORVASC) 5 mg tablet Take 1 Tab by mouth daily.   ??? atorvastatin (LIPITOR) 40 mg tablet Take 1 Tab by  mouth nightly.   ??? clopidogrel (PLAVIX) 75 mg tab Take 1 Tab by mouth daily.   ??? isosorbide dinitrate (ISORDIL) 10 mg tablet Take 1 Tab by mouth three (3) times daily.   ??? metoprolol tartrate (LOPRESSOR) 50 mg tablet Take 1 Tab by mouth every eight (8) hours.   ??? aspirin 81 mg chewable tablet Take 1 Tab by mouth daily.   ??? butalbital-acetaminophen-caff (FIORICET) 50-300-40 mg per capsule Take 1 Cap by mouth every six (6) hours as needed for Headache. Max Daily Amount: 4 Caps.   ??? pantoprazole (PROTONIX) 40 mg tablet Take 1 Tab by mouth Daily (before breakfast).   ??? nitroglycerin (NITROSTAT) 0.4 mg SL tablet 1 Tab by SubLINGual route every five (5) minutes as needed for Chest Pain.   ??? pregabalin (LYRICA) 100 mg capsule Take 1 Cap by mouth two (2) times a  day. Max Daily Amount: 200 mg. Indications: neuropathic pain   ??? naproxen (NAPROSYN) 500 mg tablet Take 1 Tab by mouth two (2) times daily (with meals) for 10 days.   ??? gabapentin (NEURONTIN) 300 mg capsule Take 1 Cap by mouth three (3) times daily.   ??? OTHER Check CBC, CMP, Mg in 4 days, results to PCP immediately. Diagnosis- CAD   ??? OTHER This is to certify that William Griffin was admitted to [facility] on 03/05/16 and discharged on 03/08/16 , and has been advised to take rest at home for 14 ( fourteen ) more days and then resume work if symptom free.   ??? OTHER Diet- Cardiac, no concentrated sweets   ??? cyanocobalamin (VITAMIN B12) 500 mcg tablet Take 1 Tab by mouth daily.   ??? pregabalin (LYRICA) 100 mg capsule Take 1 Cap by mouth two (2) times a day. Max Daily Amount: 200 mg. Indications: neuropathic pain     No current facility-administered medications for this visit.        Allergies and Sensitivities:  No Known Allergies    Family History:  Family History   Problem Relation Age of Onset   ??? Hypertension Mother    ??? Stroke Mother    ??? Hypertension Father    ??? Asthma Sister        Social History:  He  reports that he has quit smoking. His smoking use included Cigarettes. He smoked 0.50 packs per day. He has never used smokeless tobacco.  He  reports that he does not drink alcohol.      Review of Systems:  As above, otherwise 10 point review negative.      Physical:  Visit Vitals   ??? BP (!) 134/98   ??? Pulse 76   ??? Ht 5\' 5"  (1.651 m)   ??? Wt 72.1 kg (159 lb)   ??? SpO2 99%   ??? BMI 26.46 kg/m2         Exam:  Neck:  Supple, no JVD, no carotid bruits  CV:  Normal S1 and  S2, no murmurs, rubs, or gallops noted  Lungs:  Clear to ausculation throughout, no wheezes or rales  Abd:  Soft, non-tender, non-distended with good bowel sounds.  No hepatosplenomegaly  Extremities:  No edema      Data:  EKG:   Normal sinus rhythm. ??LAE. ??Non-specific ST-T wave changes.      LABS:  Lab Results   Component Value Date/Time     Sodium 135 03/07/2016 12:37 AM    Potassium 4.6 03/07/2016 12:37 AM    Chloride 103 03/07/2016 12:37  AM    CO2 26 03/07/2016 12:37 AM    Glucose 90 03/07/2016 12:37 AM    BUN 8 03/07/2016 12:37 AM    Creatinine 0.83 03/07/2016 12:37 AM     Lab Results   Component Value Date/Time    Cholesterol, total 167 03/06/2016 10:45 AM    HDL Cholesterol 56 03/06/2016 10:45 AM    LDL, calculated 91.4 03/06/2016 10:45 AM    Triglyceride 98 03/06/2016 10:45 AM    CHOL/HDL Ratio 3.0 03/06/2016 10:45 AM     Lab Results   Component Value Date/Time    ALT (SGPT) 30 03/05/2016 11:02 AM         Impression/Plan:  1.  CAD, status post non-STEMI, OM 2 with 100% distal occlusion being treated medically  2.  Hypertension, blood pressure elevated but has not been taking medication as prescribed  3.  Peripheral neuropathy, awaiting approval for his Lyrica    Mr. William Griffin was seen today for post hospital follow-up.  He is status post non-STEMI on 03/05/2016.  He underwent cardiac catheterization during his hospitalization was found to have an 100% distal OM to occlusion.  Due to the vessels tortuosity and size, it was felt that continued medical management was the best treatment.  He was also found to have minimal 10% luminal irregularities in the LAD as well as a mid 30% plaque in the left circumflex.  An echocardiogram done during his hospitalization showed ejection fraction of 60% and no wall motion abnormalities.    He is being discharged home, he states that he has been feeling fairly well.  He has noticed occasional palpitations and an occasional pain on the left side of his rib cage.  His blood pressure is slightly elevated but while reviewing his medications it was noted that he is only taking his metoprolol and isosorbide once a day instead of the prescribed 3 times a day.  He was instructed to take the medication as prescribed in order to keep his blood pressure well controlled.  His breath sounds are clear and  he does not exhibit any lower extremity edema.  No additional changes were made to his medications.    Results of the findings of his cardiac catheterization and echocardiogram were reviewed with him again.  We had a long discussion regarding the importance of compliance with his medications and with follow-up.  We also discussed the importance of adhering to a low-sodium, heart healthy diet.  Patient education material regarding MI, low-sodium diet, heart healthy diet attached to his after visit summary.  Greater than 50% of a 40 minute visit was spent in discussion, counseling, and answering questions.    Written prescriptions were given to him today for his cardiac medications.  Unfortunately, he has limited finances as he is currently unemployed and does not have any insurance.  He states that he was [facility] but he is not been seen by them in follow-up since his MI.  If the clinic is unable to assist him with his medications, I did ask that he have partial refills filled for his medications so that he does not go without.  The importance of being on his medications was stressed.  We will try to get him enrolled in the new doctor Rx prescription program.  He was given the necessary paperwork to complete and fax to the designated contact person with the program.    He will follow-up with Dr. Heinz KnucklesSkillen as scheduled and  as needed.      Harlin Heys MSN, FNP-BC    Please note:  Portions of this chart were created with Dragon medical speech to text program.  Unrecognized errors may be present.

## 2016-03-20 NOTE — Progress Notes (Signed)
1. Have you been to the ER, urgent care clinic since your last visit?  Hospitalized since your last visit?Yes cardiac cath 03/05/16-03/10/16     2. Have you seen or consulted any other health care providers outside of the [facility] since your last visit?  Include any pap smears or colon screening. No

## 2016-03-20 NOTE — Patient Instructions (Addendum)
Continue present medication regimen  Follow-up with Dr. Heinz Knuckles as scheduled and as needed         Heart Attack: Care Instructions  Your Care Instructions    A heart attack (myocardial infarction, or MI) occurs when one or more of the coronary arteries, which supply the heart with oxygen-rich blood, is blocked. A blockage usually occurs when plaque inside the artery breaks open and a blood clot forms in the artery.  After a heart attack, you may be worried about your future. Over the next several weeks, your heart will start to heal. Though it can be hard to break old habits, you can prevent another heart attack by making some lifestyle changes and by taking medicines. You may use this information for ideas about what to do at home to speed your recovery.  Follow-up care is a key part of your treatment and safety. Be sure to make and go to all appointments, and call your doctor if you are having problems. It's also a good idea to know your test results and keep a list of the medicines you take.  How can you care for yourself at home?  Activity  ? ?? Until your doctor says it is okay, do not do strenuous exercise. And do not lift, pull, or push anything heavy. Ask your doctor what types of activities are safe for you.   ? ?? If your doctor has not set you up with a cardiac rehabilitation (rehab) program, talk to him or her about whether that is right for you. Cardiac rehab includes supervised exercise. It also includes help with diet and lifestyle changes and emotional support. It may reduce your risk of future heart problems.   ? ?? Increase your activities slowly. Take short rest breaks when you get tired.   ? ?? If your doctor recommends it, get more exercise. Walking is a good choice. Bit by bit, increase the amount you walk every day. Try for at least 30 minutes on most days of the week. You also may want to swim, bike, or do other activities. Talk with your doctor before you start an  exercise program to make sure it is safe for you.   ? ?? Ask your doctor when you can drive, go back to work, and do other daily activities again.   ? ?? You can have sex as soon as you feel ready for it. Often this means when you can easily walk around or climb stairs. Talk with your doctor if you have any concerns. If you are taking nitroglycerin, do not take erection-enhancing medicine such as sildenafil (Viagra), tadalafil (Cialis), or vardenafil (Levitra) .   ?Lifestyle changes  ? ?? Do not smoke. Smoking increases your risk of another heart attack. If you need help quitting, talk to your doctor about stop-smoking programs and medicines. These can increase your chances of quitting for good.   ? ?? Eat a heart-healthy diet that is low in saturated fat and salt, and is full of fruits, vegetables and whole-grains. Eat at least two servings of fish each week. You may get more details about how to eat healthy. But these tips can help you get started.   ? ?? Stay at a healthy weight, or lose weight if you need to.   Medicines  ? ?? Be safe with medicines. Take your medicines exactly as prescribed. Call your doctor if you think you are having a problem with your medicine. You will get more details on the  specific medicines your doctor prescribes. Do not stop taking your medicine unless your doctor tells you to. Not taking your medicine might raise your risk of having another heart attack.   ? ?? You may need several medicines to help lower your risk of another heart attack. These include:  ?? Blood pressure medicines such as angiotensin-converting enzyme (ACE) inhibitors, ARBs (angiotensin II receptor blockers), and beta-blockers.  ?? Cholesterol medicine called statins.  ?? Aspirin and other blood thinners. These prevent blood clots that can cause a heart attack.   ? ?? If your doctor has given you nitroglycerin, keep it with you at all times. If you have angina symptoms, such as chest pain or pressure, sit  down and rest. Take the first dose of nitroglycerin as directed. If symptoms get worse or are not getting better within 5 minutes, call 911 right away. Stay on the phone. The emergency operator will tell you what to do.   ? ?? Do not take any over-the-counter medicines, vitamins, or herbal products without talking to your doctor first.   ?Staying healthy  ? ?? Manage other health conditions such as high blood pressure and diabetes.   ? ?? Avoid colds and flu. Get a pneumococcal vaccine shot. If you have had one before, ask your doctor whether you need another dose. Get the flu vaccine every year. If you must be around people with colds or flu, wash your hands often.   ? ?? Be sure to tell your doctor about any angina symptoms you have had, even if they went away. Pay attention to your angina symptoms. Know what is typical for you and learn how to control it. Know when to call for help.   ? ?? Talk to your family, friends, or a counselor about your feelings. It is normal to feel frightened, angry, hopeless, helpless, and even guilty. Talking openly about bad feelings can help you cope. If you have symptoms of depression, talk to your doctor.   When should you call for help?  Call 911 anytime you think you may need emergency care. For example, call if:  ? ?? You have symptoms of a heart attack. These may include:  ?? Chest pain or pressure, or a strange feeling in the chest.  ?? Sweating.  ?? Shortness of breath.  ?? Nausea or vomiting.  ?? Pain, pressure, or a strange feeling in the back, neck, jaw, or upper belly or in one or both shoulders or arms.  ?? Lightheadedness or sudden weakness.  ?? A fast or irregular heartbeat.  After you call 911, the operator may tell you to chew 1 adult-strength or 2 to 4 low-dose aspirin. Wait for an ambulance. Do not try to drive yourself.   ? ?? You have angina symptoms (such as chest pain or pressure) that do not go away with rest or are not getting better within 5 minutes after you  take a dose of nitroglycerin.   ? ?? You passed out (lost consciousness).   ? ?? You feel like you are having another heart attack.   ?Call your doctor now or seek immediate medical care if:  ? ?? You are having angina symptoms, such as chest pain or pressure, more often than usual, or the symptoms are different or worse than usual.   ? ?? You have new or increased shortness of breath.   ? ?? You are dizzy or lightheaded, or you feel like you may faint.   ?Watch closely for changes  in your health, and be sure to contact your doctor if you have any problems.  Where can you learn more?  Go to [employer].  Enter [phone] in the search box to learn more about "Heart Attack: Care Instructions."  Current as of: January 03, 2015  Content Version: 11.4  ?? 2006-2017 [employer]. Care instructions adapted under license by [employer] (which disclaims liability or warranty for this information). If you have questions about a medical condition or this instruction, always ask your healthcare professional. [employer] disclaims any warranty or liability for your use of this information.       Heart-Healthy Diet: Care Instructions  Your Care Instructions    A heart-healthy diet has lots of vegetables, fruits, nuts, beans, and whole grains, and is low in salt. It limits foods that are high in saturated fat, such as meats, cheeses, and fried foods. It may be hard to change your diet, but even small changes can lower your risk of heart attack and heart disease.  Follow-up care is a key part of your treatment and safety. Be sure to make and go to all appointments, and call your doctor if you are having problems. It's also a good idea to know your test results and keep a list of the medicines you take.  How can you care for yourself at home?  Watch your portions  ?? Learn what a serving is. A "serving" and a "portion" are not always the  same thing. Make sure that you are not eating larger portions than are recommended. For example, a serving of pasta is ?? cup. A serving size of meat is 2 to 3 ounces. A 3-ounce serving is about the size of a deck of cards. Measure serving sizes until you are good at "eyeballing" them. Keep in mind that restaurants often serve portions that are 2 or 3 times the size of one serving.  ?? To keep your energy level up and keep you from feeling hungry, eat often but in smaller portions.  ?? Eat only the number of calories you need to stay at a healthy weight. If you need to lose weight, eat fewer calories than your body burns (through exercise and other physical activity).  Eat more fruits and vegetables  ?? Eat a variety of fruit and vegetables every day. Dark green, deep orange, red, or yellow fruits and vegetables are especially good for you. Examples include spinach, carrots, peaches, and berries.  ?? Keep carrots, celery, and other veggies handy for snacks. Buy fruit that is in season and store it where you can see it so that you will be tempted to eat it.  ?? Cook dishes that have a lot of veggies in them, such as stir-fries and soups.  Limit saturated and trans fat  ?? Read food labels, and try to avoid saturated and trans fats. They increase your risk of heart disease. Trans fat is found in many processed foods such as cookies and crackers.  ?? Use olive or canola oil when you cook. Try cholesterol-lowering spreads, such as Benecol or Take Control.  ?? Bake, broil, grill, or steam foods instead of frying them.  ?? Choose lean meats instead of high-fat meats such as hot dogs and sausages. Cut off all visible fat when you prepare meat.  ?? Eat fish, skinless poultry, and meat alternatives such as soy products instead of high-fat meats. Soy products, such as tofu, may be especially good for your heart.  ?? Choose low-fat  or fat-free milk and dairy products.  Eat fish   ?? Eat at least two servings of fish a week. Certain fish, such as salmon and tuna, contain omega-3 fatty acids, which may help reduce your risk of heart attack.  Eat foods high in fiber  ?? Eat a variety of grain products every day. Include whole-grain foods that have lots of fiber and nutrients. Examples of whole-grain foods include oats, whole wheat bread, and brown rice.  ?? Buy whole-grain breads and cereals, instead of white bread or pastries.  Limit salt and sodium  ?? Limit how much salt and sodium you eat to help lower your blood pressure.  ?? Taste food before you salt it. Add only a little salt when you think you need it. With time, your taste buds will adjust to less salt.  ?? Eat fewer snack items, fast foods, and other high-salt, processed foods. Check food labels for the amount of sodium in packaged foods.  ?? Choose low-sodium versions of canned goods (such as soups, vegetables, and beans).  Limit sugar  ?? Limit drinks and foods with added sugar. These include candy, desserts, and soda pop.  Limit alcohol  ?? Limit alcohol to no more than 2 drinks a day for men and 1 drink a day for women. Too much alcohol can cause health problems.  When should you call for help?  Watch closely for changes in your health, and be sure to contact your doctor if:  ? ?? You would like help planning heart-healthy meals.   Where can you learn more?  Go to [employer].  Enter V137 in the search box to learn more about "Heart-Healthy Diet: Care Instructions."  Current as of: January 03, 2015  Content Version: 11.4  ?? 2006-2017 [employer]. Care instructions adapted under license by [employer] (which disclaims liability or warranty for this information). If you have questions about a medical condition or this instruction, always ask your healthcare professional. [employer] disclaims any warranty or liability for your use of this information.        Low Sodium Diet (2,000 Milligram): Care Instructions  Your Care Instructions    Too much sodium causes your body to hold on to extra water. This can raise your blood pressure and force your heart and kidneys to work harder. In very serious cases, this could cause you to be put in the hospital. It might even be life-threatening. By limiting sodium, you will feel better and lower your risk of serious problems.  The most common source of sodium is salt. People get most of the salt in their diet from canned, prepared, and packaged foods. Fast food and restaurant meals also are very high in sodium. Your doctor will probably limit your sodium to less than 2,000 milligrams (mg) a day. This limit counts all the sodium in prepared and packaged foods and any salt you add to your food.  Follow-up care is a key part of your treatment and safety. Be sure to make and go to all appointments, and call your doctor if you are having problems. It's also a good idea to know your test results and keep a list of the medicines you take.  How can you care for yourself at home?  Read food labels  ?? Read labels on cans and food packages. The labels tell you how much sodium is in each serving. Make sure that you look at the serving size. If you eat more than the serving size,  you have eaten more sodium.  ?? Food labels also tell you the Percent Daily Value for sodium. Choose products with low Percent Daily Values for sodium.  ?? Be aware that sodium can come in forms other than salt, including monosodium glutamate (MSG), sodium citrate, and sodium bicarbonate (baking soda). MSG is often added to Asian food. When you eat out, you can sometimes ask for food without MSG or added salt.  Buy low-sodium foods  ?? Buy foods that are labeled "unsalted" (no salt added), "sodium-free" (less than 5 mg of sodium per serving), or "low-sodium" (less than 140 mg of sodium per serving). Foods labeled "reduced-sodium" and "light sodium"  may still have too much sodium. Be sure to read the label to see how much sodium you are getting.  ?? Buy fresh vegetables, or frozen vegetables without added sauces. Buy low-sodium versions of canned vegetables, soups, and other canned goods.  Prepare low-sodium meals  ?? Cut back on the amount of salt you use in cooking. This will help you adjust to the taste. Do not add salt after cooking. One teaspoon of salt has about 2,300 mg of sodium.  ?? Take the salt shaker off the table.  ?? Flavor your food with garlic, lemon juice, onion, vinegar, herbs, and spices. Do not use soy sauce, lite soy sauce, steak sauce, onion salt, garlic salt, celery salt, mustard, or ketchup on your food.  ?? Use low-sodium salad dressings, sauces, and ketchup. Or make your own salad dressings and sauces without adding salt.  ?? Use less salt (or none) when recipes call for it. You can often use half the salt a recipe calls for without losing flavor. Other foods such as rice, pasta, and grains do not need added salt.  ?? Rinse canned vegetables, and cook them in fresh water. This removes some-but not all-of the salt.  ?? Avoid water that is naturally high in sodium or that has been treated with water softeners, which add sodium. Call your local water company to find out the sodium content of your water supply. If you buy bottled water, read the label and choose a sodium-free brand.  Avoid high-sodium foods  ?? Avoid eating:  ?? Smoked, cured, salted, and canned meat, fish, and poultry.  ?? Ham, bacon, hot dogs, and luncheon meats.  ?? Regular, hard, and processed cheese and regular peanut butter.  ?? Crackers with salted tops, and other salted snack foods such as pretzels, chips, and salted popcorn.  ?? Frozen prepared meals, unless labeled low-sodium.  ?? Canned and dried soups, broths, and bouillon, unless labeled sodium-free or low-sodium.  ?? Canned vegetables, unless labeled sodium-free or low-sodium.   ?? [place] fries, pizza, tacos, and other fast foods.  ?? Pickles, olives, ketchup, and other condiments, especially soy sauce, unless labeled sodium-free or low-sodium.  Where can you learn more?  Go to [employer]://www.healthwise.net/[facility].  Enter [phone] in the search box to learn more about "Low Sodium Diet (2,000 Milligram): Care Instructions."  Current as of: Aug 24, 2015  Content Version: 11.4  ?? 2006-2017 [employer]. Care instructions adapted under license by [employer] (which disclaims liability or warranty for this information). If you have questions about a medical condition or this instruction, always ask your healthcare professional. [employer] disclaims any warranty or liability for your use of this information.       Reducing Heart Attack Risk With Daily Medicine: Care Instructions  Your Care Instructions    Heart disease is the number  one cause of death. If you are at risk for heart disease, there are many medicines that can reduce your risk. These include:  ?? ACE inhibitors. These are a type of blood pressure medicine. They can reduce the risk of heart attacks and strokes if you are at high risk.  ?? Statin medicines. These lower cholesterol. They can also reduce the risk of heart disease and strokes.  ?? Aspirin. It can help certain people lower their risk of a heart attack or stroke.  ?? Beta-blocker medicines. These are a type of blood pressure and heart medicine. They can reduce the chance of early death if you have had a heart attack.  All medicines can cause side effects. So it is important to understand the pros and cons of any medicine you take. It is also important to take your medicines exactly as your doctor tells you to.  Follow-up care is a key part of your treatment and safety. Be sure to make and go to all appointments, and call your doctor if you are having problems. It's also a good idea to know your test results and keep a list  of the medicines you take.  ACE inhibitors  ACE (angiotensin-converting enzyme) inhibitors are used for three main reasons. They lower blood pressure, protect the kidneys, and prevent heart attacks and strokes. Examples include benazepril (Lotensin), lisinopril (Prinivil, Zestril), and ramipril (Altace).  Before you start taking an ACE inhibitor, make sure your doctor knows if:  ?? You are taking a water pill (diuretic).  ?? You are taking potassium pills or using salt substitutes.  ?? You are pregnant or breastfeeding.  ?? You have had a kidney transplant or other kidney problems.  ACE inhibitors can cause side effects. Call your doctor right away if you have:  ?? Trouble breathing.  ?? Swelling in your face, head, neck, or tongue.  ?? Dizziness or lightheadedness.  ?? A dry cough.  Statins  Statins lower cholesterol. Examples include atorvastatin (Lipitor), lovastatin (Mevacor), pravastatin (Pravachol), and simvastatin (Zocor).  Before you start taking a statin, make sure your doctor knows if:  ?? You have had a kidney transplant or other kidney problems.  ?? You have liver disease.  ?? You take any other prescription medicine, over-the-counter medicine, vitamins, supplements, or herbal remedies.  ?? You are pregnant or breastfeeding.  Statins can cause side effects. Call your doctor right away if you have:  ?? New, severe muscle aches.  ?? Brown urine.  Aspirin  Taking an aspirin every day can lower your risk for a heart attack. A heart attack occurs when a blood vessel in the heart gets blocked. When this happens, oxygen can't get to the heart muscle, and part of the heart dies. Aspirin can help prevent blood clots that can block the blood vessels.  Talk to your doctor before you start taking aspirin every day. He or she may recommend that you take one low-dose aspirin (81 mg) tablet each day, with a meal and a full glass of water.  Taking aspirin isn't right for everyone. This is because it can cause  serious bleeding. And you may not be able to use aspirin if you:  ?? Have asthma.  ?? Have an ulcer or other stomach problem.  ?? Take some other medicine (called a blood thinner) that prevents blood clots.  ?? Are allergic to aspirin.  Before having a surgery or procedure, tell your doctor or dentist that you take aspirin. He or she will tell  you if you should stop taking aspirin beforehand. Make sure that you understand exactly what your doctor wants you to do.  Aspirin can cause side effects. Call your doctor right away if you have:  ?? Unusual bleeding or bruising.  ?? Nausea, vomiting, or heartburn.  ?? Black or bloody stools.  Beta-blockers  Beta-blockers are used for three main reasons. They lower blood pressure, relieve angina symptoms (such as chest pain or pressure), and reduce the chances of a second heart attack. They include atenolol (Tenormin), carvedilol (Coreg), and metoprolol (Lopressor).  Before you start taking a beta-blocker, make sure your doctor knows if you have:  ?? Severe asthma or frequent asthma attacks.  ?? A very slow pulse (less than 55 beats a minute).  Beta-blockers can cause side effects. Call your doctor right away if you have:  ?? Wheezing or trouble breathing.  ?? Dizziness or lightheadedness.  ?? Asthma that gets worse.  When should you call for help?  Watch closely for changes in your health, and be sure to contact your doctor if you have any problems.  Where can you learn more?  Go to [employer]://www.healthwise.net/[facility].  Enter R428 in the search box to learn more about "Reducing Heart Attack Risk With Daily Medicine: Care Instructions."  Current as of: January 03, 2015  Content Version: 11.4  ?? 2006-2017 [employer]. Care instructions adapted under license by [employer] (which disclaims liability or warranty for this information). If you have questions about a medical condition or this instruction, always ask your healthcare professional. [employer] disclaims any warranty or liability for your use of this information.

## 2016-03-22 ENCOUNTER — Inpatient Hospital Stay
Admit: 2016-03-22 | Discharge: 2016-03-23 | Disposition: A | Discharge status: Home / Self Care | Payer: Self-pay | Attending: Emergency Medicine

## 2016-03-22 DIAGNOSIS — M79604 Pain in right leg: Secondary | ICD-10-CM

## 2016-03-22 MED ORDER — TRAMADOL 50 MG TAB
50 mg | ORAL_TABLET | Freq: Four times a day (QID) | ORAL | 0 refills | Status: DC | PRN
Start: 2016-03-22 — End: 2016-05-08

## 2016-03-22 MED ORDER — TRAMADOL 50 MG TAB
50 mg | ORAL | Status: AC
Start: 2016-03-22 — End: 2016-03-22
  Administered 2016-03-23: via ORAL

## 2016-03-22 NOTE — ED Provider Notes (Signed)
EMERGENCY DEPARTMENT HISTORY AND PHYSICAL EXAM    6:33 PM      Date: 03/22/2016  Patient Name: William Griffin    History of Presenting Illness     Chief Complaint   Patient presents with   ??? Leg Injury         History Provided By: Patient    Chief Complaint: Leg pain  Duration:  Days  Timing:  Constant  Location: Right  Quality: Aching  Severity: 10 out of 10  Modifying Factors: Pain exacerbated due to the change in weather  Associated Symptoms: denies any other associated signs or symptoms      Additional History (Context):     William Griffin is a 35 y.o. male with a pertinent history of sciatica, chronic pain, NSTEMI, pre-DM who presents to the ED c/o constant, RLE pain since last night. Pain is described as aching. Pt explains that his chronic pain might have exacerbated due to the change in weather.  States he has had this same pain several times in the past.  Did not sustain any injury.  Pt denies fever, bowel/bladder incontinence, back pain, numbness, weakness. No recreational drug use. No other acute symptoms or complaints were noted.     PCP: Manfred Shirts, NP    Current Outpatient Prescriptions   Medication Sig Dispense Refill   ??? traMADol (ULTRAM) 50 mg tablet Take 1 Tab by mouth every six (6) hours as needed for Pain. Max Daily Amount: 200 mg. 12 Tab 0   ??? amLODIPine (NORVASC) 5 mg tablet Take 1 Tab by mouth daily. 30 Tab 2   ??? atorvastatin (LIPITOR) 40 mg tablet Take 1 Tab by mouth nightly. 30 Tab 2   ??? clopidogrel (PLAVIX) 75 mg tab Take 1 Tab by mouth daily. 30 Tab 2   ??? isosorbide dinitrate (ISORDIL) 10 mg tablet Take 1 Tab by mouth three (3) times daily. 90 Tab 2   ??? metoprolol tartrate (LOPRESSOR) 50 mg tablet Take 1 Tab by mouth every eight (8) hours. 90 Tab 2   ??? pregabalin (LYRICA) 100 mg capsule Take 1 Cap by mouth two (2) times a day. Max Daily Amount: 200 mg. Indications: neuropathic pain 60 Cap 0   ??? naproxen (NAPROSYN) 500 mg tablet Take 1 Tab by mouth two (2) times  daily (with meals) for 10 days. 8 Tab 0   ??? gabapentin (NEURONTIN) 300 mg capsule Take 1 Cap by mouth three (3) times daily. 30 Cap 0   ??? aspirin 81 mg chewable tablet Take 1 Tab by mouth daily. 30 Tab 0   ??? butalbital-acetaminophen-caff (FIORICET) 50-300-40 mg per capsule Take 1 Cap by mouth every six (6) hours as needed for Headache. Max Daily Amount: 4 Caps. 12 Cap 0   ??? pantoprazole (PROTONIX) 40 mg tablet Take 1 Tab by mouth Daily (before breakfast). 30 Tab 0   ??? OTHER Check CBC, CMP, Mg in 4 days, results to PCP immediately. Diagnosis- CAD 1 Each 0   ??? OTHER This is to certify that William Griffin was admitted to [facility] on 03/05/16 and discharged on 03/08/16 , and has been advised to take rest at home for 14 ( fourteen ) more days and then resume work if symptom free. 1 Each 0   ??? OTHER Diet- Cardiac, no concentrated sweets 1 Each 0   ??? nitroglycerin (NITROSTAT) 0.4 mg SL tablet 1 Tab by SubLINGual route every five (5) minutes as needed for Chest Pain. 20  Tab 0   ??? cyanocobalamin (VITAMIN B12) 500 mcg tablet Take 1 Tab by mouth daily. 30 Tab 11   ??? pregabalin (LYRICA) 100 mg capsule Take 1 Cap by mouth two (2) times a day. Max Daily Amount: 200 mg. Indications: neuropathic pain 60 Cap 1       Past History     Past Medical History:  Past Medical History:   Diagnosis Date   ??? Asthma    ??? Chronic pain    ??? Insufficiency, arterial, peripheral (HCC) 11/28/2015    [facility]   ??? Neuropathy    ??? NSTEMI (non-ST elevated myocardial infarction) (HCC) 02/2016   ??? Pre-diabetes 01/08/2016    A1C 6.2   ??? Sciatica    ??? Vitamin D deficiency        Past Surgical History:  Past Surgical History:   Procedure Laterality Date   ??? 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 HX HEART CATHETERIZATION  03/05/2016    LAD 10%; LCx mid 30%; OM2 distal 100%) medical management   ??? HX ORTHOPAEDIC         Family History:  Family History   Problem Relation Age of Onset   ??? Hypertension Mother    ??? Stroke Mother     ??? Hypertension Father    ??? Asthma Sister        Social History:  Social History   Substance Use Topics   ??? Smoking status: Former Smoker     Packs/day: 0.50     Types: Cigarettes   ??? Smokeless tobacco: Never Used      Comment: quit about 5 months ago   ??? Alcohol use No      Comment: occasional "pt stated that he stop drinking"       Allergies:  No Known Allergies      Review of Systems       Review of Systems   Constitutional: Negative for fever.   Gastrointestinal: Negative for constipation.   Genitourinary: Negative for difficulty urinating.   Musculoskeletal: Positive for myalgias.   All other systems reviewed and are negative.        Physical Exam     Visit Vitals   ??? BP 137/90   ??? Pulse 98   ??? Temp 97.9 ??F (36.6 ??C)   ??? Resp 18   ??? Ht 5\' 5"  (1.651 m)   ??? Wt 68 kg (150 lb)   ??? SpO2 97%   ??? BMI 24.96 kg/m2         Physical Exam   Constitutional: He is oriented to person, place, and time. He appears well-developed and well-nourished.   HENT:   Head: Normocephalic and atraumatic.   Eyes: Conjunctivae are normal.   Neck: Neck supple.   Cardiovascular: Normal rate, regular rhythm and normal heart sounds.  Exam reveals no gallop and no friction rub.    No murmur heard.  Pulmonary/Chest: Effort normal and breath sounds normal. No respiratory distress. He has no wheezes. He has no rales.   Musculoskeletal:   Right lateral superior leg with reproducible TTP; full ROM; 5/5 strength throughout, 2+ pedal pulses.  No TTP to left leg.     No TTP to back.     Neurological: He is alert and oriented to person, place, and time. He exhibits normal muscle tone.   Skin: Skin is warm and dry. No rash noted. No erythema. No pallor.   Psychiatric: He  has a normal mood and affect. His behavior is normal. Judgment and thought content normal.   Nursing note and vitals reviewed.        Medical Decision Making   I am the first provider for this patient.    I reviewed the vital signs, available nursing notes, past medical history,  past surgical history, family history and social history.    Vital Signs-Reviewed the patient's vital signs.    Pulse Oximetry Analysis -  98% on room air     Records Reviewed: Nursing Notes and Old Medical Records (Time of Review: 6:33 PM)    ED Course: Progress Notes, Reevaluation, and Consults:    Provider Notes (Medical Decision Making): 35 yo with sciatica, chronic pain, neuropathy, PAD presents c/o right lateral superior leg pain since yesterday when it started raining.  Says this happens often when the weather changes.  He is normally on Lyrica and Ultram, as given [facility] him by the [facility] but states his medications have not come in yet.  He is requesting some medication to help get him through.  He does not have any pain to his left leg currently and did not have any injury.  F/u with [facility], will write for a few ultram for home.     Procedures:     Diagnosis     Clinical Impression:   1. Right leg pain    2. Other chronic pain        Disposition: Discharged     Follow-up Information     Follow up With Details Comments Contact Info    Manfred ShirtsKristel D Spalding, NP In 3 days  [address]  [place] [id number]  [phone]      [facility] EMERGENCY DEPT  If symptoms worsen [address]  [place] [id number]  [phone]           Patient's Medications   Start Taking    TRAMADOL (ULTRAM) 50 MG TABLET    Take 1 Tab by mouth every six (6) hours as needed for Pain. Max Daily Amount: 200 mg.   Continue Taking    AMLODIPINE (NORVASC) 5 MG TABLET    Take 1 Tab by mouth daily.    ASPIRIN 81 MG CHEWABLE TABLET    Take 1 Tab by mouth daily.    ATORVASTATIN (LIPITOR) 40 MG TABLET    Take 1 Tab by mouth nightly.    BUTALBITAL-ACETAMINOPHEN-CAFF (FIORICET) 50-300-40 MG PER CAPSULE    Take 1 Cap by mouth every six (6) hours as needed for Headache. Max Daily Amount: 4 Caps.    CLOPIDOGREL (PLAVIX) 75 MG TAB    Take 1 Tab by mouth daily.    CYANOCOBALAMIN (VITAMIN B12) 500 MCG TABLET    Take 1 Tab by mouth daily.     GABAPENTIN (NEURONTIN) 300 MG CAPSULE    Take 1 Cap by mouth three (3) times daily.    ISOSORBIDE DINITRATE (ISORDIL) 10 MG TABLET    Take 1 Tab by mouth three (3) times daily.    METOPROLOL TARTRATE (LOPRESSOR) 50 MG TABLET    Take 1 Tab by mouth every eight (8) hours.    NAPROXEN (NAPROSYN) 500 MG TABLET    Take 1 Tab by mouth two (2) times daily (with meals) for 10 days.    NITROGLYCERIN (NITROSTAT) 0.4 MG SL TABLET    1 Tab by SubLINGual route every five (5) minutes as needed for Chest Pain.    OTHER    Check  CBC, CMP, Mg in 4 days, results to PCP immediately. Diagnosis- CAD    OTHER    This is to certify that De Jaworski was admitted to [facility] on 03/05/16 and discharged on 03/08/16 , and has been advised to take rest at home for 14 ( fourteen ) more days and then resume work if symptom free.    OTHER    Diet- Cardiac, no concentrated sweets    PANTOPRAZOLE (PROTONIX) 40 MG TABLET    Take 1 Tab by mouth Daily (before breakfast).    PREGABALIN (LYRICA) 100 MG CAPSULE    Take 1 Cap by mouth two (2) times a day. Max Daily Amount: 200 mg. Indications: neuropathic pain    PREGABALIN (LYRICA) 100 MG CAPSULE    Take 1 Cap by mouth two (2) times a day. Max Daily Amount: 200 mg. Indications: neuropathic pain   These Medications have changed    No medications on file   Stop Taking    No medications on file     _______________________________    Attestations:  Scribe Attestation     Ulice Dash acting as a scribe for and in the presence of Nicolette Bang, [place]      March 22, 2016 at 6:33 PM       Provider Attestation:      I personally performed the services described in the documentation, reviewed the documentation, as recorded by the scribe in my presence, and it accurately and completely records my words and actions. March 22, 2016 at 6:33 PM - Nicolette Bang, PA    _______________________________    Nicolette Bang, PA

## 2016-03-22 NOTE — ED Notes (Signed)
I have reviewed discharge instructions with the patient.  The patient verbalized understanding.Discharge medications reviewed with patient and appropriate educational materials and side effects teaching were provided.Patient armband removed and shredded. Pt signed paper discharge instructions removed all belongings, escorted via wheel chair without distress or discomfort.

## 2016-03-22 NOTE — ED Triage Notes (Addendum)
Patient complains of pain to both legs.  States that his left leg is hurting real bad.  Patient states that his leg was aching up last night .

## 2016-03-23 MED FILL — TRAMADOL 50 MG TAB: 50 mg | ORAL | Qty: 1

## 2016-03-25 ENCOUNTER — Inpatient Hospital Stay: Admit: 2016-03-25 | Payer: Self-pay | Primary: Family

## 2016-03-25 DIAGNOSIS — I2510 Atherosclerotic heart disease of native coronary artery without angina pectoris: Secondary | ICD-10-CM

## 2016-03-25 LAB — METABOLIC PANEL, COMPREHENSIVE
A-G Ratio: 0.8 (ref 0.8–1.7)
ALT (SGPT): 198 U/L — ABNORMAL HIGH (ref 16–61)
AST (SGOT): 92 U/L — ABNORMAL HIGH (ref 15–37)
Albumin: 3.4 g/dL (ref 3.4–5.0)
Alk. phosphatase: 113 U/L (ref 45–117)
Anion gap: 8 mmol/L (ref 3.0–18)
BUN/Creatinine ratio: 7 — ABNORMAL LOW (ref 12–20)
BUN: 6 MG/DL — ABNORMAL LOW (ref 7.0–18)
Bilirubin, total: 0.6 MG/DL (ref 0.2–1.0)
CO2: 29 mmol/L (ref 21–32)
Calcium: 9 MG/DL (ref 8.5–10.1)
Chloride: 100 mmol/L (ref 100–108)
Creatinine: 0.82 MG/DL (ref 0.6–1.3)
GFR est AA: >60 mL/min/{1.73_m2} (ref >60)
GFR est non-AA: >60 mL/min/{1.73_m2} (ref >60)
Globulin: 4.5 g/dL — ABNORMAL HIGH (ref 2.0–4.0)
Glucose: 110 mg/dL — ABNORMAL HIGH (ref 74–99)
Potassium: 4.4 mmol/L (ref 3.5–5.5)
Protein, total: 7.9 g/dL (ref 6.4–8.2)
Sodium: 137 mmol/L (ref 136–145)

## 2016-03-25 LAB — CBC WITH AUTOMATED DIFF
ABS. BASOPHILS: 0 10*3/uL (ref 0.0–0.06)
ABS. EOSINOPHILS: 0.1 10*3/uL (ref 0.0–0.4)
ABS. LYMPHOCYTES: 2.1 10*3/uL (ref 0.9–3.6)
ABS. MONOCYTES: 0.5 10*3/uL (ref 0.05–1.2)
ABS. NEUTROPHILS: 3 10*3/uL (ref 1.8–8.0)
BASOPHILS: 0 % (ref 0–2)
EOSINOPHILS: 2 % (ref 0–5)
HCT: 38.4 % (ref 36.0–48.0)
HGB: 12.7 g/dL — ABNORMAL LOW (ref 13.0–16.0)
LYMPHOCYTES: 37 % (ref 21–52)
MCH: 27.7 PG (ref 24.0–34.0)
MCHC: 33.1 g/dL (ref 31.0–37.0)
MCV: 83.8 FL (ref 74.0–97.0)
MONOCYTES: 9 % (ref 3–10)
MPV: 10.9 FL (ref 9.2–11.8)
NEUTROPHILS: 52 % (ref 40–73)
PLATELET: 278 10*3/uL (ref 135–420)
RBC: 4.58 M/uL — ABNORMAL LOW (ref 4.70–5.50)
RDW: 13.9 % (ref 11.6–14.5)
WBC: 5.8 10*3/uL (ref 4.6–13.2)

## 2016-03-25 LAB — MAGNESIUM: Magnesium: 2 mg/dL (ref 1.6–2.6)

## 2016-03-25 NOTE — Telephone Encounter (Signed)
Requested Prescriptions     Pending Prescriptions Disp Refills   ??? gabapentin (NEURONTIN) 300 mg capsule 30 Cap 0     Sig: Take 1 Cap by mouth three (3) times daily.   ??? traMADol (ULTRAM) 50 mg tablet 12 Tab 0     Sig: Take 1 Tab by mouth every six (6) hours as needed for Pain. Max Daily Amount: 200 mg.     Patient is requesting instead of Lyrica due to it being too expensive.

## 2016-03-25 NOTE — Telephone Encounter (Signed)
Patient informed that Dr Garner NashMorewitz will no longer give him RX for Ultram and that he [facility].

## 2016-03-25 NOTE — Telephone Encounter (Signed)
Last filled:  Gabapentin: 03/19/16 by Dr. Janey GreaserSalomonsky  Tramadol 03/22/16 by PA Kizzie BaneHughes

## 2016-03-25 NOTE — Telephone Encounter (Signed)
No further ultram if patient unable to get lyrica thru pcp will need them to order pain management

## 2016-04-08 ENCOUNTER — Encounter: Attending: Sleep Medicine | Primary: Family

## 2016-04-17 ENCOUNTER — Encounter: Attending: Sleep Medicine | Primary: Family

## 2016-04-24 ENCOUNTER — Encounter

## 2016-05-03 DIAGNOSIS — I214 Non-ST elevation (NSTEMI) myocardial infarction: Principal | ICD-10-CM

## 2016-05-03 NOTE — ED Notes (Signed)
Patient experienced a Heart attack in November

## 2016-05-03 NOTE — ED Triage Notes (Signed)
Patient states that he is having left sided chest pain that is radiating to the left arm.  Patient states that he took 5 nitro. Patient currently c/o dizziness. Patient currently AOx4

## 2016-05-04 ENCOUNTER — Emergency Department: Admit: 2016-05-04 | Payer: Self-pay | Primary: Family

## 2016-05-04 ENCOUNTER — Inpatient Hospital Stay
Admit: 2016-05-04 | Discharge: 2016-05-08 | Disposition: A | Discharge status: Home Health | Payer: Self-pay | Attending: Internal Medicine | Admitting: Internal Medicine

## 2016-05-04 LAB — EKG 12-LEAD
Atrial Rate: 59 {beats}/min
Atrial Rate: 59 {beats}/min
Atrial Rate: 68 {beats}/min
Diagnosis: NORMAL
P Axis: 52 degrees
P Axis: 53 degrees
P Axis: 64 degrees
P-R Interval: 146 ms
P-R Interval: 148 ms
P-R Interval: 150 ms
Q-T Interval: 374 ms
Q-T Interval: 398 ms
Q-T Interval: 416 ms
QRS Duration: 88 ms
QRS Duration: 92 ms
QRS Duration: 96 ms
QTc Calculation (Bazett): 394 ms
QTc Calculation (Bazett): 397 ms
QTc Calculation (Bazett): 411 ms
R Axis: 42 degrees
R Axis: 52 degrees
R Axis: 64 degrees
T Axis: 25 degrees
T Axis: 39 degrees
T Axis: 59 degrees
Ventricular Rate: 59 {beats}/min
Ventricular Rate: 59 {beats}/min
Ventricular Rate: 68 {beats}/min

## 2016-05-04 LAB — ECHOCARDIOGRAM 2D W DOPPLER W CONTRAST LIMITED: Left Ventricular Ejection Fraction: 60

## 2016-05-04 LAB — EKG, 12 LEAD, INITIAL
Atrial Rate: 59 {beats}/min
Atrial Rate: 59 {beats}/min
Atrial Rate: 68 {beats}/min
Calculated P Axis: 52 degrees
Calculated P Axis: 53 degrees
Calculated P Axis: 64 degrees
Calculated R Axis: 42 degrees
Calculated R Axis: 52 degrees
Calculated R Axis: 64 degrees
Calculated T Axis: 25 degrees
Calculated T Axis: 39 degrees
Calculated T Axis: 59 degrees
Diagnosis: NORMAL
P-R Interval: 146 ms
P-R Interval: 148 ms
P-R Interval: 150 ms
Q-T Interval: 374 ms
Q-T Interval: 398 ms
Q-T Interval: 416 ms
QRS Duration: 88 ms
QRS Duration: 92 ms
QRS Duration: 96 ms
QTC Calculation (Bezet): 394 ms
QTC Calculation (Bezet): 397 ms
QTC Calculation (Bezet): 411 ms
Ventricular Rate: 59 {beats}/min
Ventricular Rate: 59 {beats}/min
Ventricular Rate: 68 {beats}/min

## 2016-05-04 LAB — METABOLIC PANEL, BASIC
Anion gap: 6 mmol/L (ref 3.0–18)
Anion gap: 7 mmol/L (ref 3.0–18)
BUN/Creatinine ratio: 10 — ABNORMAL LOW (ref 12–20)
BUN/Creatinine ratio: 10 — ABNORMAL LOW (ref 12–20)
BUN: 10 MG/DL (ref 7.0–18)
BUN: 9 MG/DL (ref 7.0–18)
CO2: 25 mmol/L (ref 21–32)
CO2: 25 mmol/L (ref 21–32)
Calcium: 8.8 MG/DL (ref 8.5–10.1)
Calcium: 8.4 MG/DL — ABNORMAL LOW (ref 8.5–10.1)
Chloride: 108 mmol/L (ref 100–108)
Chloride: 109 mmol/L — ABNORMAL HIGH (ref 100–108)
Creatinine: 0.97 MG/DL (ref 0.6–1.3)
Creatinine: 0.87 MG/DL (ref 0.6–1.3)
GFR est AA: >60 mL/min/{1.73_m2} (ref >60)
GFR est AA: >60 mL/min/{1.73_m2} (ref >60)
GFR est non-AA: >60 mL/min/{1.73_m2} (ref >60)
GFR est non-AA: >60 mL/min/{1.73_m2} (ref >60)
Glucose: 110 mg/dL — ABNORMAL HIGH (ref 74–99)
Glucose: 84 mg/dL (ref 74–99)
Potassium: 3.5 mmol/L (ref 3.5–5.5)
Potassium: 3.7 mmol/L (ref 3.5–5.5)
Sodium: 139 mmol/L (ref 136–145)
Sodium: 141 mmol/L (ref 136–145)

## 2016-05-04 LAB — CBC WITH AUTOMATED DIFF
ABS. BASOPHILS: 0 10*3/uL (ref 0.0–0.1)
ABS. EOSINOPHILS: 0.1 10*3/uL (ref 0.0–0.4)
ABS. LYMPHOCYTES: 3.6 10*3/uL (ref 0.9–3.6)
ABS. MONOCYTES: 0.5 10*3/uL (ref 0.05–1.2)
ABS. NEUTROPHILS: 2.3 10*3/uL (ref 1.8–8.0)
BASOPHILS: 0 % (ref 0–2)
EOSINOPHILS: 1 % (ref 0–5)
HCT: 33.8 % — ABNORMAL LOW (ref 36.0–48.0)
HGB: 11.3 g/dL — ABNORMAL LOW (ref 13.0–16.0)
LYMPHOCYTES: 55 % — ABNORMAL HIGH (ref 21–52)
MCH: 26.9 PG (ref 24.0–34.0)
MCHC: 33.4 g/dL (ref 31.0–37.0)
MCV: 80.5 FL (ref 74.0–97.0)
MONOCYTES: 8 % (ref 3–10)
MPV: 10.9 FL (ref 9.2–11.8)
NEUTROPHILS: 36 % — ABNORMAL LOW (ref 40–73)
PLATELET: 264 10*3/uL (ref 135–420)
RBC: 4.2 M/uL — ABNORMAL LOW (ref 4.70–5.50)
RDW: 13.6 % (ref 11.6–14.5)
WBC: 6.4 10*3/uL (ref 4.6–13.2)

## 2016-05-04 LAB — TROPONIN I: Troponin-I, QT: 1 NG/ML — ABNORMAL HIGH (ref 0.0–0.045)

## 2016-05-04 LAB — PROTHROMBIN TIME + INR
INR: 1 (ref 0.8–1.2)
Prothrombin time: 12.9 s (ref 11.5–15.2)

## 2016-05-04 LAB — CARDIAC PANEL,(CK, CKMB & TROPONIN)
CK - MB: 1.7 ng/ml (ref <3.6)
CK-MB Index: 0.6 % (ref 0.0–4.0)
CK: 267 U/L (ref 39–308)
Troponin-I, QT: 0.04 NG/ML (ref 0.0–0.045)

## 2016-05-04 LAB — CBC W/O DIFF
HCT: 35.3 % — ABNORMAL LOW (ref 36.0–48.0)
HGB: 11.8 g/dL — ABNORMAL LOW (ref 13.0–16.0)
MCH: 27.2 PG (ref 24.0–34.0)
MCHC: 33.4 g/dL (ref 31.0–37.0)
MCV: 81.3 FL (ref 74.0–97.0)
MPV: 10.7 FL (ref 9.2–11.8)
PLATELET: 253 10*3/uL (ref 135–420)
RBC: 4.34 M/uL — ABNORMAL LOW (ref 4.70–5.50)
RDW: 13.7 % (ref 11.6–14.5)
WBC: 5.3 10*3/uL (ref 4.6–13.2)

## 2016-05-04 LAB — HEPATIC FUNCTION PANEL
A-G Ratio: 0.8 (ref 0.8–1.7)
ALT (SGPT): 53 U/L (ref 16–61)
AST (SGOT): 43 U/L — ABNORMAL HIGH (ref 15–37)
Albumin: 3.4 g/dL (ref 3.4–5.0)
Alk. phosphatase: 90 U/L (ref 45–117)
Bilirubin, direct: 0.1 MG/DL (ref 0.0–0.2)
Bilirubin, total: 0.6 MG/DL (ref 0.2–1.0)
Globulin: 4.4 g/dL — ABNORMAL HIGH (ref 2.0–4.0)
Protein, total: 7.8 g/dL (ref 6.4–8.2)

## 2016-05-04 LAB — POC TROPONIN-I: Troponin-I (POC): <0.04 ng/mL (ref 0.00–0.08)

## 2016-05-04 LAB — TSH 3RD GENERATION: TSH: 3.36 u[IU]/mL (ref 0.36–3.74)

## 2016-05-04 MED ORDER — ONDANSETRON (PF) 4 MG/2 ML INJECTION
4 mg/2 mL | INTRAMUSCULAR | Status: AC
Start: 2016-05-04 — End: 2016-05-04
  Administered 2016-05-04: 07:00:00 via INTRAVENOUS

## 2016-05-04 MED ORDER — MAGNESIUM HYDROXIDE 400 MG/5 ML ORAL SUSP
400 mg/5 mL | Freq: Every day | ORAL | Status: DC | PRN
Start: 2016-05-04 — End: 2016-05-08

## 2016-05-04 MED ORDER — HEPARIN (PORCINE) IN D5W 25,000 UNIT/250 ML IV
25000 unit/250 mL(100 unit/mL) | INTRAVENOUS | Status: DC
Start: 2016-05-04 — End: 2016-05-05
  Administered 2016-05-04 – 2016-05-05 (×2): via INTRAVENOUS

## 2016-05-04 MED ORDER — PREGABALIN 75 MG CAP
75 mg | Freq: Every day | ORAL | Status: DC
Start: 2016-05-04 — End: 2016-05-05
  Administered 2016-05-05 (×2): via ORAL

## 2016-05-04 MED ORDER — NALOXONE 0.4 MG/ML INJECTION
0.4 mg/mL | INTRAMUSCULAR | Status: DC | PRN
Start: 2016-05-04 — End: 2016-05-08

## 2016-05-04 MED ORDER — ACETAMINOPHEN 325 MG TABLET
325 mg | ORAL | Status: DC | PRN
Start: 2016-05-04 — End: 2016-05-07

## 2016-05-04 MED ORDER — NITROGLYCERIN 2 % TRANSDERMAL OINTMENT
2 % | TRANSDERMAL | Status: AC
Start: 2016-05-04 — End: 2016-05-04
  Administered 2016-05-04: 10:00:00 via TOPICAL

## 2016-05-04 MED ORDER — NITROGLYCERIN 2 % TRANSDERMAL OINTMENT
2 % | Freq: Four times a day (QID) | TRANSDERMAL | Status: DC
Start: 2016-05-04 — End: 2016-05-06
  Administered 2016-05-06 (×2): via TOPICAL

## 2016-05-04 MED ORDER — SODIUM CHLORIDE 0.9 % IJ SYRG
INTRAMUSCULAR | Status: DC | PRN
Start: 2016-05-04 — End: 2016-05-08

## 2016-05-04 MED ORDER — DOCUSATE SODIUM 100 MG CAP
100 mg | Freq: Two times a day (BID) | ORAL | Status: DC
Start: 2016-05-04 — End: 2016-05-08
  Administered 2016-05-04 – 2016-05-08 (×7): via ORAL

## 2016-05-04 MED ORDER — ONDANSETRON (PF) 4 MG/2 ML INJECTION
4 mg/2 mL | INTRAMUSCULAR | Status: DC | PRN
Start: 2016-05-04 — End: 2016-05-08

## 2016-05-04 MED ORDER — LISINOPRIL 5 MG TAB
5 mg | Freq: Every day | ORAL | Status: DC
Start: 2016-05-04 — End: 2016-05-06
  Administered 2016-05-04 – 2016-05-06 (×3): via ORAL

## 2016-05-04 MED ORDER — MORPHINE 4 MG/ML INTRAVENOUS SOLUTION
4 mg/mL | INTRAVENOUS | Status: AC
Start: 2016-05-04 — End: 2016-05-04
  Administered 2016-05-04: 07:00:00 via INTRAVENOUS

## 2016-05-04 MED ORDER — DIPHENHYDRAMINE HCL 50 MG/ML IJ SOLN
50 mg/mL | INTRAMUSCULAR | Status: DC | PRN
Start: 2016-05-04 — End: 2016-05-07

## 2016-05-04 MED ORDER — HEPARIN (PORCINE) 1,000 UNIT/ML IJ SOLN
1000 unit/mL | Freq: Once | INTRAMUSCULAR | Status: AC
Start: 2016-05-04 — End: 2016-05-04
  Administered 2016-05-04: 16:00:00 via INTRAVENOUS

## 2016-05-04 MED ORDER — HYDROCODONE-ACETAMINOPHEN 10 MG-325 MG TAB
10-325 mg | ORAL | Status: DC | PRN
Start: 2016-05-04 — End: 2016-05-08

## 2016-05-04 MED ORDER — KETOROLAC TROMETHAMINE 30 MG/ML INJECTION
30 mg/mL (1 mL) | INTRAMUSCULAR | Status: AC
Start: 2016-05-04 — End: 2016-05-04
  Administered 2016-05-04: 08:00:00 via INTRAVENOUS

## 2016-05-04 MED ORDER — MORPHINE 4 MG/ML SYRINGE
4 mg/mL | INTRAMUSCULAR | Status: AC
Start: 2016-05-04 — End: 2016-05-04
  Administered 2016-05-04: 07:00:00 via INTRAVENOUS

## 2016-05-04 MED ORDER — ASPIRIN 81 MG CHEWABLE TAB
81 mg | Freq: Every day | ORAL | Status: DC
Start: 2016-05-04 — End: 2016-05-08
  Administered 2016-05-05 – 2016-05-08 (×4): via ORAL

## 2016-05-04 MED ORDER — ATORVASTATIN 40 MG TAB
40 mg | Freq: Every evening | ORAL | Status: DC
Start: 2016-05-04 — End: 2016-05-08
  Administered 2016-05-05 – 2016-05-08 (×4): via ORAL

## 2016-05-04 MED ORDER — HYDROMORPHONE (PF) 1 MG/ML IJ SOLN
1 mg/mL | INTRAMUSCULAR | Status: DC | PRN
Start: 2016-05-04 — End: 2016-05-08
  Administered 2016-05-04 – 2016-05-08 (×23): via INTRAVENOUS

## 2016-05-04 MED ORDER — SODIUM CHLORIDE 0.9 % IJ SYRG
Freq: Three times a day (TID) | INTRAMUSCULAR | Status: DC
Start: 2016-05-04 — End: 2016-05-08
  Administered 2016-05-04 – 2016-05-08 (×14): via INTRAVENOUS

## 2016-05-04 MED ORDER — FENTANYL CITRATE (PF) 50 MCG/ML IJ SOLN
50 mcg/mL | INTRAMUSCULAR | Status: AC
Start: 2016-05-04 — End: 2016-05-04
  Administered 2016-05-04: 10:00:00 via INTRAVENOUS

## 2016-05-04 MED ORDER — CLOPIDOGREL 75 MG TAB
75 mg | Freq: Every day | ORAL | Status: DC
Start: 2016-05-04 — End: 2016-05-08
  Administered 2016-05-04 – 2016-05-08 (×5): via ORAL

## 2016-05-04 MED ORDER — ZOLPIDEM 5 MG TAB
5 mg | Freq: Every evening | ORAL | Status: DC | PRN
Start: 2016-05-04 — End: 2016-05-07

## 2016-05-04 MED ORDER — METOPROLOL TARTRATE 25 MG TAB
25 mg | Freq: Four times a day (QID) | ORAL | Status: DC
Start: 2016-05-04 — End: 2016-05-04

## 2016-05-04 MED FILL — HEPARIN (PORCINE) IN D5W 25,000 UNIT/250 ML IV: 25000 unit/250 mL(100 unit/mL) | INTRAVENOUS | Qty: 250

## 2016-05-04 MED FILL — CLOPIDOGREL 75 MG TAB: 75 mg | ORAL | Qty: 1

## 2016-05-04 MED FILL — HYDROMORPHONE (PF) 1 MG/ML IJ SOLN: 1 mg/mL | INTRAMUSCULAR | Qty: 2

## 2016-05-04 MED FILL — KETOROLAC TROMETHAMINE 30 MG/ML INJECTION: 30 mg/mL (1 mL) | INTRAMUSCULAR | Qty: 1

## 2016-05-04 MED FILL — BD POSIFLUSH NORMAL SALINE 0.9 % INJECTION SYRINGE: INTRAMUSCULAR | Qty: 10

## 2016-05-04 MED FILL — LISINOPRIL 5 MG TAB: 5 mg | ORAL | Qty: 1

## 2016-05-04 MED FILL — MORPHINE 4 MG/ML INTRAVENOUS SOLUTION: 4 mg/mL | INTRAVENOUS | Qty: 1

## 2016-05-04 MED FILL — DOCUSATE SODIUM 100 MG CAP: 100 mg | ORAL | Qty: 1

## 2016-05-04 MED FILL — NITRO-BID 2 % TRANSDERMAL OINTMENT: 2 % | TRANSDERMAL | Qty: 1

## 2016-05-04 MED FILL — FENTANYL CITRATE (PF) 50 MCG/ML IJ SOLN: 50 mcg/mL | INTRAMUSCULAR | Qty: 2

## 2016-05-04 MED FILL — ONDANSETRON (PF) 4 MG/2 ML INJECTION: 4 mg/2 mL | INTRAMUSCULAR | Qty: 2

## 2016-05-04 MED FILL — HEPARIN (PORCINE) 1,000 UNIT/ML IJ SOLN: 1000 unit/mL | INTRAMUSCULAR | Qty: 10

## 2016-05-04 NOTE — Progress Notes (Signed)
Bedside and Verbal shift change report given to Zaneta H, RN (oncoming nurse) by Kori Hicks, RN (offgoing nurse). Report included the following information SBAR, Kardex and MAR.

## 2016-05-04 NOTE — Progress Notes (Signed)
Completed Echocardiogram. Report to follow. Patient to be transported back to room.    Erica Hooks, RCS, RDCS

## 2016-05-04 NOTE — ED Notes (Signed)
TRANSFER - OUT REPORT:    Verbal report given to April RN(name) on Ancil L Pixler  being transferred to [address] South(unit) for routine progression of care   Report consisted of patient???s Situation, Background, Assessment and   Recommendations(SBAR). Information from the following report(s) SBAR, ED Summary, Procedure Summary, MAR, Recent Results and Cardiac Rhythm NSR was reviewed with the receiving nurse.    Opportunity for questions and clarification was provided.      Patient transported with ED Tech

## 2016-05-04 NOTE — Other (Signed)
Assumed care of pt. C/o pain in left arm rating it 8/10. No neurological, respiratory or cardiac abnormalities noted. Hospitalist notified pt on unit. Awaiting orders.

## 2016-05-04 NOTE — H&P (Signed)
Hospitalist Admission History and Physical    NAME:  William Griffin   DOB:   05/06/1980   MRN:   [id number]     PCP:  William Shirts, NP  Date/Time:  05/04/2016 9:06 AM  Subjective:   CHIEF COMPLAINT:  Chest pain     HISTORY OF PRESENT ILLNESS:     William Griffin is a 36 y.o. male with history that includes Recent NSTEMI presents with sudden onset of left-sided chest pain that occurred last night about 9 pm when he was getting sitting in bed getting ready to go to sleep. Pain was severe radiating down left arm associated with dizziness and dyspnea.  He took 5 nitro sublingual and it didn't help (he was advised not to take more than 3 tabs SL).  Initial ER workup was negative but third troponin was 1.  At this time he continues to have chest pain but now 6/10 comapred to 9/10 last night.  CXR shows RML infiltrate but he has no fevers chills or cough.    Past Medical History:   Diagnosis Date   ??? Asthma    ??? Chronic pain    ??? Insufficiency, arterial, peripheral (HCC) 11/28/2015    [facility]   ??? Neuropathy    ??? NSTEMI (non-ST elevated myocardial infarction) (HCC) 02/2016   ??? Pre-diabetes 01/08/2016    A1C 6.2   ??? Sciatica    ??? Vitamin D deficiency         Past Surgical History:   Procedure Laterality Date   ??? 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 HX HEART CATHETERIZATION  03/05/2016    LAD 10%; LCx mid 30%; OM2 distal 100%) medical management   ??? HX ORTHOPAEDIC         Social History   Substance Use Topics   ??? Smoking status: Former Smoker     Packs/day: 0.50     Types: Cigarettes   ??? Smokeless tobacco: Never Used      Comment: quit about 5 months ago   ??? Alcohol use No      Comment: occasional "pt stated that he stop drinking"        Family History   Problem Relation Age of Onset   ??? Hypertension Mother    ??? Stroke Mother    ??? Hypertension Father    ??? Asthma Sister         No Known Allergies     Prior to Admission Medications   Prescriptions Last Dose Informant Patient Reported? Taking?   OTHER   No No    Sig: Check CBC, CMP, Mg in 4 days, results to PCP immediately. Diagnosis- CAD   OTHER   No No   Sig: This is to certify that William Griffin was admitted to [facility] on 03/05/16 and discharged on 03/08/16 , and has been advised to take rest at home for 14 ( fourteen ) more days and then resume work if symptom free.   OTHER   No No   Sig: Diet- Cardiac, no concentrated sweets   amLODIPine (NORVASC) 5 mg tablet 04/27/2016 at Unknown time  No Yes   Sig: Take 1 Tab by mouth daily.   aspirin 81 mg chewable tablet 04/03/2016 at Unknown time  No Yes   Sig: Take 1 Tab by mouth daily.   atorvastatin (LIPITOR) 40 mg tablet 04/27/2016 at Unknown time  No Yes   Sig: Take 1 Tab  by mouth nightly.   butalbital-acetaminophen-caff (FIORICET) 50-300-40 mg per capsule Unknown at Unknown time  No No   Sig: Take 1 Cap by mouth every six (6) hours as needed for Headache. Max Daily Amount: 4 Caps.   clopidogrel (PLAVIX) 75 mg tab 04/27/2016 at Unknown time  No Yes   Sig: Take 1 Tab by mouth daily.   cyanocobalamin (VITAMIN B12) 500 mcg tablet   No No   Sig: Take 1 Tab by mouth daily.   gabapentin (NEURONTIN) 300 mg capsule Unknown at Unknown time  No No   Sig: Take 1 Cap by mouth three (3) times daily.   isosorbide dinitrate (ISORDIL) 10 mg tablet 05/03/2016 at Unknown time  No Yes   Sig: Take 1 Tab by mouth three (3) times daily.   metoprolol tartrate (LOPRESSOR) 50 mg tablet 04/27/2016 at Unknown time  No Yes   Sig: Take 1 Tab by mouth every eight (8) hours.   nitroglycerin (NITROSTAT) 0.4 mg SL tablet 05/04/2016 at Unknown time  No Yes   Sig: 1 Tab by SubLINGual route every five (5) minutes as needed for Chest Pain.   pantoprazole (PROTONIX) 40 mg tablet 04/27/2016 at Unknown time  No Yes   Sig: Take 1 Tab by mouth Daily (before breakfast).   pregabalin (LYRICA) 100 mg capsule 04/27/2016 at Unknown time  No Yes   Sig: Take 1 Cap by mouth two (2) times a day. Max Daily Amount: 200 mg. Indications: neuropathic pain    pregabalin (LYRICA) 100 mg capsule   No No   Sig: Take 1 Cap by mouth two (2) times a day. Max Daily Amount: 200 mg. Indications: neuropathic pain   traMADol (ULTRAM) 50 mg tablet 04/27/2016 at Unknown time  No Yes   Sig: Take 1 Tab by mouth every six (6) hours as needed for Pain. Max Daily Amount: 200 mg.      Facility-Administered Medications: None       REVIEW OF SYSTEMS:    Unable to obtain  ROS due to  mental status change  sedated   intubated  Total of 12 systems reviewed as follows:  Const:  negative fever chills  weakness  weight loss  Eyes:  negative visual changes  dry eyes  eye pain   ENT:  negativesore throat  tongue or lip swelling  Resp:  negative cough   sputum dyspnea  wheezing  DOE  Cards:  negative chest pain  palpitations  lower extremity edema  GI:  negative nausea  vomiting  diarrhea  abdominal pain  GU:  negative frequency  dysuria  urethral discharge  Skin:  negative rash pruritus  Hem:  negative easy bruising and gum/nose bleeding  Musc:  negative myalgias back pain muscle weakness  Neuro:  negative headaches dizziness vertigo slurred speech CVA symptoms  Psych:  negative anxiety depression suicidal homicidal  All other symptoms reviewed and were negative        Objective:   VITALS:    Visit Vitals   ??? BP 127/84 (BP 1 Location: Right arm, BP Patient Position: At rest)   ??? Pulse (!) 57   ??? Temp 97.7 ??F (36.5 ??C)   ??? Resp 16   ??? Ht 5\' 5"  (1.651 m)   ??? Wt 68 kg (150 lb)   ??? SpO2 97%   ??? BMI 24.96 kg/m2     Temp (24hrs), Avg:97.8 ??F (36.6 ??C), Min:97.7 ??F (36.5 ??C), Max:97.9 ??F (36.6 ??C)      PHYSICAL  EXAM:   Gen:  Alert cooperative NAD not alert or oriented distress:___.     Head:  Normocephalic without obvious abnormality, atraumatic.  Eyes:  Conjunctivae clear anicteric sclerae Pupils are equal EOMI  Nose:  Nares normal No drainage sinus tenderness  Throat:   Lips, mucosa, and tongue normal.  Neck:  Supple symmetrical no adenopathy no carotid bruit no JVD.  Back:  Symmetric No CVA tenderness.   Lungs:  CTA good BS  diminished bilat. wheezing rhonchi bilat. bibasilar crackles.  Chest wall:  No tenderness No Accessory muscle use.  Heart:  S1S2 no MGR S1-S2 (+)RRR No murmur Irr-Irr tachy murmur  Abd:  SNTBS (+) soft rigid not distended tender: ___ no rebound no guarding.  Ext:  no cyanosis no edema clubbing edema:___  Skin:  Texture normal turgor normal No rashes no jaundiced  Lymph:  Cervical normal supraclavicular normal.  Psych:  Good insight not depressed not anxious not agitated  No SI or HI Unable to assess  Neuro:  CN 2-12 intact No facial asymmetry speech normal Normal strength AAOx4  Additional pertinent findings:      LAB DATA REVIEWED:    No components found for: [facility]  Recent Labs      05/03/16   2323   NA  139   K  3.5   CL  108   CO2  25   BUN  10   CREA  0.97   GLU  110*   CA  8.8   WBC  6.4   HGB  11.3*   HCT  33.8*   PLT  264         IMAGING RESULTS:    I have personally reviewed the actual   CXR  AXR  CT scan   MRI    CXR: Mild infiltrate/atelectasis in the right medial lung base, probably from the lower lobe.    Assessment/Plan:   1. NSTEMI acute  2. Dyspnea secondary to NSTEMI  3. DM type 2  ___________________________________________________  PLAN:    Risk of deterioration:  Low    Moderate  High  -  Will admit for further workup and treatment of possible ACS.   -  Vitals per protocol  -  Bed rest with BSC  Meds:   -  Oxygen per protocol  -  ASA   -  NTG  -  Beta blocker  -  Ace inhibitor  -  Statin  -  Morphine as needed for pain  -  Heparin IV  -  Plavix   - Diabetic management with accuchecks, ISS, and consider basal coverage.  Labs:  Serial Troponin, CMP, CBC,   Extras:    -  ECG prn CP  -  PCXR prn SOB  -  Echocardiogram  Consult(s):   -  Cardiology d/w Dr. Zoila Shutter who will perform cath tomorrow    **Chart reviewed including medications, vitals, notes, labs and pertinent studies.  **Assessment, condition, and plan reviewed and all questions and concerns addressed.     Patient will need to receive treatment that can only be done on an inpatient basis which will require at 2 for diansosis and treatment of NSTEMI.    Prophylaxis:  Lovenox  Coumadin  Hep SQ  SCD???s  H2B/PPI HE WILL BE ON HEPARIN DRIP    Disposition:  Home w/ Family   HH PT,OT,RN   SNF/LTC   SAH/Rehab    Discussed Code Status:   Full Code  DNR      DNI     ___________________________________________________    Care Plan discussed with:    Patient   Family    Nurse  ED Doc   Specialist :    Total Time Coordinating Admission:      minutes    Total Critical Care Time:     ___________________________________________________  Admitting Physician: Donnal MoatHugo M Cambelle Suchecki, MD

## 2016-05-04 NOTE — Other (Signed)
Bedside shift change report given to Eli PhillipsKori, Charity fundraiserN (oncoming nurse) by April M Roser (offgoing nurse). Report included the following information SBAR, Kardex, ED Summary and Recent Results.

## 2016-05-04 NOTE — Consults (Signed)
[facility] - Consult Note    Consultation request by Donnal MoatHugo M Rodriguez, MD for advice/opinion related to evaluating Unstable angina (HCC);NSTEMI (non-ST elevated myocardial in*    Date of  Admission: 05/04/2016  1:08 AM   Primary Care Physician:  Manfred ShirtsKristel D Spalding, NP     Assessment:     Patient Active Problem List   Diagnosis Code   ??? Sciatica M54.30   ??? Therapeutic drug monitoring Z51.81   ??? Peripheral vascular disease (HCC) I73.9   ??? 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 Neuropathic pain M79.2   ??? Elevated BP without diagnosis of hypertension R03.0   ??? Influenza vaccination declined Z28.21   ??? Pre-diabetes R73.03   ??? NSTEMI (non-ST elevated myocardial infarction) (HCC) I21.4   ??? GERD (gastroesophageal reflux disease) K21.9   ??? Unstable angina (HCC) I20.0     -- Acute NSTEMI.  Patient's troponin level rose to 1.0 this morning.  Patient's symptoms consistent with an acute coronary syndrome.  He did not have any new EKG changes, so I suspect that his prior circumflex/obtuse marginal branch vessel disease continues to be the culprit.  With recurrent chest pain, he would likely benefit from revascularization.  --Single-vessel coronary disease.  Patient underwent a cardiac catheterization 2 months ago when he presented with an acute MI and was found to have an occluded obtuse marginal branch which was felt to be too small for revascularization, however, on review of his angiogram, I suspect that this can be revascularized percutaneously.  Normal left ventricular ejection fraction on echocardiogram in November 2017.  No significant change or new regional wall motion abnormality seen on today's  limited echocardiogram.  Patient had been medically treated over the past 2 months with dual antiplatelet therapy and dual antianginal therapy.  He was never started on a beta-blocker because of bradycardia.  --Essential hypertension.  This appears to be reasonably well-controlled.   --Prior history of tobacco use.  Patient quit smoking in July 2017.  --Peripheral vascular disease.  Diagnosed on noninvasive imaging at [facility] in August 2017.       Plan:     --Heparin intravenous bolus and infusion will be started.  -- Continue aspirin, Plavix, and potent statin.  --Topical nitrates.  If the patient develops recurrent chest pain, I would start him on intravenous nitroglycerin.  --Would not start a beta-blocker due to his resting bradycardia.  We will arrange for cardiac catheterization with possible PCI tomorrow morning since he has apparently failed optimal medical therapy.  N.p.o. after midnight except for medications.    Discussed at length with patient who is willing to proceed with repeat procedure.         History of Present Illness:     This is a 36 y.o. male admitted for Unstable angina (HCC);NSTEMI (non-ST elevated myocardial in*.    Patient complains of: Chest pain radiating into his left shoulder and arm.    Patient presented to the emergency room last night complaining of acute onset left-sided chest pain which radiated into his left shoulder and left upper arm.  The pain was severe and started at rest.  It was somewhat reminiscent of his recent MI from 2 months ago.  At that time he had more severe substernal chest pain which radiated down his entire left arm and into his hand with tingling and numbness.  This episode was not quite as severe.    He did undergo a cardiac catheterization in  November 2017 which showed single-vessel CAD with a thrombotic occlusion of an obtuse marginal branch which was not revascularized at that time due to the size.  Optimal medical therapy was started, for which the patient has been compliant initially for the first month, however, the patient reports that over the past 1-2 weeks he has not been as compliant with all of his medications.  He states he is even been exercising regularly 3 days a week and have been  doing very well up until last night.  He does not have any exertional symptoms.    In the emergency room, his initial troponin was unremarkable, his EKG did not show any acute ST or T-wave abnormalities.  However his subsequent troponin level rose to 1.0 this morning.  He states his symptoms have significantly improved, though there is still some soreness in his left arm.  He is currently not having any chest pain.  EKG and chest x-ray were personally reviewed by me and were both unremarkable.        Review of Symptoms:  Except as stated above include:  Constitutional:  negative  Respiratory:  negative  Cardiovascular:  negative  Gastrointestinal: negative  Genitourinary:  negative  Musculoskeletal:  Negative  Neurological:  Negative  Dermatological:  Negative  Endocrinological: Negative  Psychological:  Negative    A comprehensive review of systems was negative except for that written in the HPI.     Past Medical History:     Past Medical History:   Diagnosis Date   ??? Asthma    ??? Chronic pain    ??? Insufficiency, arterial, peripheral (HCC) 11/28/2015    See PVL from [facility]   ??? Neuropathy    ??? NSTEMI (non-ST elevated myocardial infarction) (HCC) 02/2016   ??? Pre-diabetes 01/08/2016    A1C 6.2   ??? Sciatica    ??? Vitamin D deficiency          Social History:     Social History     Social History   ??? Marital status: SINGLE     Spouse name: N/A   ??? Number of children: N/A   ??? 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 Smoking status: Former Smoker     Packs/day: 0.50     Types: Cigarettes   ??? Smokeless tobacco: Never Used      Comment: quit about 5 months ago   ??? Alcohol use No      Comment: occasional "pt stated that he stop drinking"   ??? Drug use: No   ??? Sexual activity: Not on file     Other Topics Concern   ??? 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 Blood Transfusions No   ??? Caffeine Concern No   ??? Occupational Exposure No   ??? Hobby Hazards No    ??? Sleep Concern No   ??? Stress Concern No   ??? Weight Concern No   ??? Special Diet No   ??? Back Care No   ??? Exercise Yes   ??? Bike Helmet No   ??? Seat Belt Yes     Social History Narrative        Family History:     Family History   Problem Relation Age of Onset   ??? Hypertension Mother    ??? Stroke Mother    ??? Hypertension  Father    ??? Asthma Sister         Medications:   No Known Allergies     Current Facility-Administered Medications   Medication Dose Route Frequency   ??? acetaminophen (TYLENOL) tablet 650 mg  650 mg Oral Q4H PRN   ??? HYDROcodone-acetaminophen (NORCO) 10-325 mg tablet 1 Tab  1 Tab Oral Q4H PRN   ??? naloxone (NARCAN) injection 0.4 mg  0.4 mg IntraVENous PRN   ??? diphenhydrAMINE (BENADRYL) injection 12.5 mg  12.5 mg IntraVENous Q4H PRN   ??? ondansetron (ZOFRAN) injection 4 mg  4 mg IntraVENous Q4H PRN   ??? docusate sodium (COLACE) capsule 100 mg  100 mg Oral BID   ??? zolpidem (AMBIEN) tablet 5 mg  5 mg Oral QHS PRN   ??? HYDROmorphone (PF) (DILAUDID) injection 2 mg  2 mg IntraVENous Q4H PRN   ??? sodium chloride (NS) flush 5-10 mL  5-10 mL IntraVENous Q8H   ??? sodium chloride (NS) flush 5-10 mL  5-10 mL IntraVENous PRN   ??? nitroglycerin (NITROBID) 2 % ointment 1 Inch  1 Inch Topical Q6H   ??? heparin 25,000 units in D5W 250 ml infusion  12-25 Units/kg/hr IntraVENous TITRATE   ??? [START ON 05/05/2016] aspirin chewable tablet 81 mg  81 mg Oral DAILY   ??? lisinopril (PRINIVIL, ZESTRIL) tablet 5 mg  5 mg Oral DAILY   ??? atorvastatin (LIPITOR) tablet 40 mg  40 mg Oral QHS   ??? magnesium hydroxide (MILK OF MAGNESIA) 400 mg/5 mL oral suspension 30 mL  30 mL Oral DAILY PRN   ??? clopidogrel (PLAVIX) tablet 75 mg  75 mg Oral DAILY         Physical Exam:     Visit Vitals   ??? BP 127/84 (BP 1 Location: Right arm, BP Patient Position: At rest)   ??? Pulse (!) 57   ??? Temp 97.7 ??F (36.5 ??C)   ??? Resp 16   ??? Ht 5\' 5"  (1.651 m)   ??? Wt 68 kg (150 lb)   ??? SpO2 97%   ??? BMI 24.96 kg/m2     BP Readings from Last 3 Encounters:   05/04/16 127/84    03/22/16 137/90   03/20/16 (!) 134/98     Pulse Readings from Last 3 Encounters:   05/04/16 (!) 57   03/22/16 98   03/20/16 76     Wt Readings from Last 3 Encounters:   05/03/16 68 kg (150 lb)   03/22/16 68 kg (150 lb)   03/20/16 72.1 kg (159 lb)       General:  alert, cooperative, no distress, appears stated age  Neck:  no carotid bruit, no JVD  Lungs:  clear to auscultation bilaterally  Heart:  regular rate and rhythm, S1, S2 normal, no murmur, click, rub or gallop  Abdomen:  abdomen is soft without significant tenderness, masses, organomegaly or guarding  Extremities:  extremities normal, atraumatic, no cyanosis or edema  Skin: Warm and dry. no hyperpigmentation, vitiligo, or suspicious lesions  Neuro: alert, oriented x3, affect appropriate, no focal neurological deficits, moves all extremities well, no involuntary movements  Psych: non focal     Data Review:     Recent Labs      05/04/16   0920  05/03/16   2323   WBC  5.3  6.4   HGB  11.8*  11.3*   HCT  35.3*  33.8*   PLT  253  264     Recent  Labs      05/04/16   0920  05/03/16   2323   NA  141  139   K  3.7  3.5   CL  109*  108   CO2  25  25   GLU  84  110*   BUN  9  10   CREA  0.87  0.97   CA  8.4*  8.8   ALB  3.4   --    SGOT  43*   --    ALT  53   --    INR  1.0   --        Results for orders placed or performed during the hospital encounter of 05/04/16   EKG, 12 LEAD, INITIAL   Result Value Ref Range    Ventricular Rate 59 BPM    Atrial Rate 59 BPM    P-R Interval 146 ms    QRS Duration 88 ms    Q-T Interval 416 ms    QTC Calculation (Bezet) 411 ms    Calculated P Axis 52 degrees    Calculated R Axis 42 degrees    Calculated T Axis 25 degrees    Diagnosis       Sinus bradycardia  Otherwise normal ECG  When compared with ECG of 04-May-2016 03:14,  No significant change was found     Results for orders placed or performed in visit on 03/20/16   AMB POC EKG ROUTINE W/ 12 LEADS, INTER & REP    Narrative     Read by Livingston Diones, MD - Normal sinus rhythm.  LAE.  Non-specific ST-T wave changes.       All Cardiac Markers in the last 24 hours:    Lab Results   Component Value Date/Time    CPK 267 05/03/2016 11:23 PM    CKMB 1.7 05/03/2016 11:23 PM    CKND1 0.6 05/03/2016 11:23 PM    TROIQ 1.00 (H) 05/04/2016 06:26 AM    TROIQ 0.04 05/03/2016 11:23 PM    TNIPOC <0.04 05/04/2016 03:08 AM       Last Lipid:    Lab Results   Component Value Date/Time    Cholesterol, total 167 03/06/2016 10:45 AM    HDL Cholesterol 56 03/06/2016 10:45 AM    LDL, calculated 91.4 03/06/2016 10:45 AM    Triglyceride 98 03/06/2016 10:45 AM    CHOL/HDL Ratio 3.0 03/06/2016 10:45 AM       Signed By: Jerlyn Ly, MD     May 04, 2016

## 2016-05-04 NOTE — H&P (Signed)
EMERGENCY DEPARTMENT HISTORY AND PHYSICAL EXAM    1:24 AM      Date: 05/04/2016  Patient Name: William Griffin    History of Presenting Illness     Chief Complaint   Patient presents with   ??? Chest Pain       History Provided By: Patient    Chief Complaint: Chest pain    35 yom with pmhx NSTEMI November 2017 (LAD 10%; LCx mid 30%; OM2 distal 100%, medical management), osteoarthritis, pre diabetes, PAD, who presents for evaluation of chest pain. Pain began while at rest suddenly at as 930 pm. Retrosternal and radiating down the left arm in a pain pattern cw prior anginal equivalents. Took 5 nitro tabs without relief. Has been without 'some' of his medications over the past few days. Cannot afford meds, and is unsure which ones he is currently taking. Denies any other recent changes to his medical status. Denies prior pe, dvt    PCP: Manfred Shirts, NP        Past History     Past Medical History:  Past Medical History:   Diagnosis Date   ??? Asthma    ??? Chronic pain    ??? 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 Neuropathy    ??? NSTEMI (non-ST elevated myocardial infarction) (HCC) 02/2016   ??? Pre-diabetes 01/08/2016    A1C 6.2   ??? Sciatica    ??? Vitamin D deficiency        Past Surgical History:  Past Surgical History:   Procedure Laterality Date   ??? 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 HX HEART CATHETERIZATION  03/05/2016    LAD 10%; LCx mid 30%; OM2 distal 100%) medical management   ??? HX ORTHOPAEDIC         Family History:  Family History   Problem Relation Age of Onset   ??? Hypertension Mother    ??? Stroke Mother    ??? Hypertension Father    ??? Asthma Sister        Social History:  Social History   Substance Use Topics   ??? Smoking status: Former Smoker     Packs/day: 0.50     Types: Cigarettes   ??? Smokeless tobacco: Never Used      Comment: quit about 5 months ago   ??? Alcohol use No      Comment: occasional "pt stated that he stop drinking"       Allergies:   No Known Allergies      Review of Systems       Review of Systems   Constitutional: Negative for chills, fatigue and fever.   HENT: Negative for ear pain, sinus pain, sore throat and trouble swallowing.    Respiratory: Positive for chest tightness and shortness of breath. Negative for cough and wheezing.    Cardiovascular: Positive for chest pain. Negative for palpitations and leg swelling.   Gastrointestinal: Negative for abdominal pain, blood in stool and diarrhea.   Genitourinary: Negative for difficulty urinating and dysuria.   Musculoskeletal: Negative for gait problem.   Skin: Negative for color change.   Neurological: Negative for syncope and headaches.   Hematological: Negative for adenopathy.         Physical Exam     Visit Vitals   ??? BP (!) 138/96 (BP 1 Location: Right arm, BP Patient Position: At rest)   ??? Pulse 90   ???  Temp 97.9 ??F (36.6 ??C)   ??? Resp 16   ??? Ht 5\' 5"  (1.651 m)   ??? Wt 68 kg (150 lb)   ??? SpO2 99%   ??? BMI 24.96 kg/m2         Physical Exam   Constitutional: He is oriented to person, place, and time. He appears well-developed and well-nourished.   HENT:   Head: Normocephalic and atraumatic.   Right Ear: External ear normal.   Left Ear: External ear normal.   Nose: Nose normal.   Mouth/Throat: Oropharynx is clear and moist.   Eyes: Conjunctivae and EOM are normal. Pupils are equal, round, and reactive to light. Right eye exhibits no discharge. Left eye exhibits no discharge.   Neck: Neck supple. No JVD present.   Cardiovascular: Normal rate, regular rhythm, normal heart sounds and intact distal pulses.  Exam reveals no gallop and no friction rub.    No murmur heard.  Pulmonary/Chest: Effort normal and breath sounds normal. No respiratory distress. He has no wheezes. He has no rales.   Abdominal: Soft. He exhibits no distension. There is no tenderness. There is no rebound.   Musculoskeletal: Normal range of motion. He exhibits no edema.   Lymphadenopathy:     He has no cervical adenopathy.    Neurological: He is alert and oriented to person, place, and time.   Skin: Skin is warm and dry. No rash noted.   Psychiatric: He has a normal mood and affect.   Nursing note and vitals reviewed.        Diagnostic Study Results     Labs -  Recent Results (from the past 12 hour(s))   EKG, 12 LEAD, INITIAL    Collection Time: 05/03/16 10:51 PM   Result Value Ref Range    Ventricular Rate 68 BPM    Atrial Rate 68 BPM    P-R Interval 150 ms    QRS Duration 92 ms    Q-T Interval 374 ms    QTC Calculation (Bezet) 397 ms    Calculated P Axis 64 degrees    Calculated R Axis 64 degrees    Calculated T Axis 59 degrees    Diagnosis       Normal sinus rhythm  Normal ECG  When compared with ECG of 05-Mar-2016 17:56,  No significant change was found     CARDIAC PANEL,(CK, CKMB & TROPONIN)    Collection Time: 05/03/16 11:23 PM   Result Value Ref Range    CK 267 39 - 308 U/L    CK - MB 1.7 <3.6 ng/ml    CK-MB Index 0.6 0.0 - 4.0 %    Troponin-I, Qt. 0.04 0.0 - 0.045 NG/ML   METABOLIC PANEL, BASIC    Collection Time: 05/03/16 11:23 PM   Result Value Ref Range    Sodium 139 136 - 145 mmol/L    Potassium 3.5 3.5 - 5.5 mmol/L    Chloride 108 100 - 108 mmol/L    CO2 25 21 - 32 mmol/L    Anion gap 6 3.0 - 18 mmol/L    Glucose 110 (H) 74 - 99 mg/dL    BUN 10 7.0 - 18 MG/DL    Creatinine 1.61 0.6 - 1.3 MG/DL    BUN/Creatinine ratio 10 (L) 12 - 20      GFR est AA >60 >60 ml/min/1.56m2    GFR est non-AA >60 >60 ml/min/1.35m2    Calcium 8.8 8.5 - 10.1 MG/DL   CBC WITH  AUTOMATED DIFF    Collection Time: 05/03/16 11:23 PM   Result Value Ref Range    WBC 6.4 4.6 - 13.2 K/uL    RBC 4.20 (L) 4.70 - 5.50 M/uL    HGB 11.3 (L) 13.0 - 16.0 g/dL    HCT 16.133.8 (L) 09.636.0 - 48.0 %    MCV 80.5 74.0 - 97.0 FL    MCH 26.9 24.0 - 34.0 PG    MCHC 33.4 31.0 - 37.0 g/dL    RDW 04.513.6 40.911.6 - 81.114.5 %    PLATELET 264 135 - 420 K/uL    MPV 10.9 9.2 - 11.8 FL    NEUTROPHILS 36 (L) 40 - 73 %    LYMPHOCYTES 55 (H) 21 - 52 %    MONOCYTES 8 3 - 10 %    EOSINOPHILS 1 0 - 5 %     BASOPHILS 0 0 - 2 %    ABS. NEUTROPHILS 2.3 1.8 - 8.0 K/UL    ABS. LYMPHOCYTES 3.6 0.9 - 3.6 K/UL    ABS. MONOCYTES 0.5 0.05 - 1.2 K/UL    ABS. EOSINOPHILS 0.1 0.0 - 0.4 K/UL    ABS. BASOPHILS 0.0 0.0 - 0.1 K/UL    DF AUTOMATED     POC TROPONIN-I    Collection Time: 05/04/16  3:08 AM   Result Value Ref Range    Troponin-I (POC) <0.04 0.00 - 0.08 ng/mL   EKG, 12 LEAD, INITIAL    Collection Time: 05/04/16  3:14 AM   Result Value Ref Range    Ventricular Rate 59 BPM    Atrial Rate 59 BPM    P-R Interval 148 ms    QRS Duration 96 ms    Q-T Interval 398 ms    QTC Calculation (Bezet) 394 ms    Calculated P Axis 53 degrees    Calculated R Axis 52 degrees    Calculated T Axis 39 degrees    Diagnosis       Sinus bradycardia  Otherwise normal ECG  When compared with ECG of 03-May-2016 22:51,  No significant change was found     TROPONIN I    Collection Time: 05/04/16  6:26 AM   Result Value Ref Range    Troponin-I, Qt. 1.00 (H) 0.0 - 0.045 NG/ML       Radiologic Studies -   XR CHEST PA LAT   Final Result            Medical Decision Making   I am the first provider for this patient.    I reviewed the vital signs, available nursing notes, past medical history, past surgical history, family history and social history.    Vital Signs-Reviewed the patient's vital signs.    Pulse Oximetry Analysis -     EKG:    Records Reviewed: Nursing Notes and Old Medical Records (Time of Review: 1:24 AM)    ED Course: Progress Notes, Reevaluation, and Consults:      Provider Notes (Medical Decision Making):   35 yom with chest pain.  NSTEMI with coronary disease November of this past year.     Cbc with mild anemia  Chem wnl  Trop 1 0.04  ECG nsr without acute ischemic voltage changes  CXR without acute process  Trop 2 neg   Perc neg  Heart score with moderate risk   Patient with continued pain after morphine, zorfran, toradol.   Will rx fentanyl, nitro.   Discussed with hospitalist for inpatient observation and further  risk stratification.    Admitted to floor.   Pt aware of dispo, in agreement.     Procedures:     Core Measures:     Critical Care Time:       Diagnosis     Clinical Impression: No diagnosis found.    Disposition: Admit    Follow-up Information     None           Current Discharge Medication List      CONTINUE these medications which have NOT CHANGED    Details   traMADol (ULTRAM) 50 mg tablet Take 1 Tab by mouth every six (6) hours as needed for Pain. Max Daily Amount: 200 mg.  Qty: 12 Tab, Refills: 0      amLODIPine (NORVASC) 5 mg tablet Take 1 Tab by mouth daily.  Qty: 30 Tab, Refills: 2      atorvastatin (LIPITOR) 40 mg tablet Take 1 Tab by mouth nightly.  Qty: 30 Tab, Refills: 2      clopidogrel (PLAVIX) 75 mg tab Take 1 Tab by mouth daily.  Qty: 30 Tab, Refills: 2      isosorbide dinitrate (ISORDIL) 10 mg tablet Take 1 Tab by mouth three (3) times daily.  Qty: 90 Tab, Refills: 2      metoprolol tartrate (LOPRESSOR) 50 mg tablet Take 1 Tab by mouth every eight (8) hours.  Qty: 90 Tab, Refills: 2      aspirin 81 mg chewable tablet Take 1 Tab by mouth daily.  Qty: 30 Tab, Refills: 0      pantoprazole (PROTONIX) 40 mg tablet Take 1 Tab by mouth Daily (before breakfast).  Qty: 30 Tab, Refills: 0      nitroglycerin (NITROSTAT) 0.4 mg SL tablet 1 Tab by SubLINGual route every five (5) minutes as needed for Chest Pain.  Qty: 20 Tab, Refills: 0      !! pregabalin (LYRICA) 100 mg capsule Take 1 Cap by mouth two (2) times a day. Max Daily Amount: 200 mg. Indications: neuropathic pain  Qty: 60 Cap, Refills: 1    Comments: Order placed on Behalf of Dr. Spero Geralds      !! pregabalin (LYRICA) 100 mg capsule Take 1 Cap by mouth two (2) times a day. Max Daily Amount: 200 mg. Indications: neuropathic pain  Qty: 60 Cap, Refills: 0      gabapentin (NEURONTIN) 300 mg capsule Take 1 Cap by mouth three (3) times daily.  Qty: 30 Cap, Refills: 0      butalbital-acetaminophen-caff (FIORICET) 50-300-40 mg per capsule Take 1  Cap by mouth every six (6) hours as needed for Headache. Max Daily Amount: 4 Caps.  Qty: 12 Cap, Refills: 0      !! OTHER Check CBC, CMP, Mg in 4 days, results to PCP immediately. Diagnosis- CAD  Qty: 1 Each, Refills: 0      !! OTHER This is to certify that Fran Mcree was admitted to [facility] on 03/05/16 and discharged on 03/08/16 , and has been advised to take rest at home for 14 ( fourteen ) more days and then resume work if symptom free.  Qty: 1 Each, Refills: 0      !! OTHER Diet- Cardiac, no concentrated sweets  Qty: 1 Each, Refills: 0      cyanocobalamin (VITAMIN B12) 500 mcg tablet Take 1 Tab by mouth daily.  Qty: 30 Tab, Refills: 11       !! - Potential duplicate medications found. Please  discuss with provider.          Please review attending notation for further details.     Omelia Blackwater DO  LT, MC, USN  Emergency Medicine, PGY-3

## 2016-05-04 NOTE — Consults (Signed)
[facility] - Consult Note    Consultation request by Donnal Moat, MD for advice/opinion related to evaluating Unstable angina (HCC);NSTEMI (non-ST elevated myocardial in*    Date of  Admission: 05/04/2016  1:08 AM   Primary Care Physician:  Manfred Shirts, NP     Assessment:     Patient Active Problem List   Diagnosis Code   ??? Sciatica M54.30   ??? Therapeutic drug monitoring Z51.81   ??? Peripheral vascular disease (HCC) I73.9   ??? 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 Neuropathic pain M79.2   ??? Elevated BP without diagnosis of hypertension R03.0   ??? Influenza vaccination declined Z28.21   ??? Pre-diabetes R73.03   ??? NSTEMI (non-ST elevated myocardial infarction) (HCC) I21.4   ??? GERD (gastroesophageal reflux disease) K21.9   ??? Unstable angina (HCC) I20.0     -- Acute NSTEMI.  Patient's troponin level rose to 1.0 this morning.  Patient's symptoms consistent with an acute coronary syndrome.  He did not have any new EKG changes, so I suspect that his prior circumflex/obtuse marginal branch vessel disease continues to be the culprit.  With recurrent chest pain, he would likely benefit from revascularization.  --Single-vessel coronary disease.  Patient underwent a cardiac catheterization 2 months ago when he presented with an acute MI and was found to have an occluded obtuse marginal branch which was felt to be too small for revascularization, however, on review of his angiogram, I suspect that this can be revascularized percutaneously.  Normal left ventricular ejection fraction on echocardiogram in November 2017.  No significant change or new regional wall motion abnormality seen on today's  limited echocardiogram.  Patient had been medically treated over the past 2 months with dual antiplatelet therapy and dual antianginal therapy.  He was never started on a beta-blocker because of bradycardia.  --Essential hypertension.  This appears to be reasonably  well-controlled.  --Prior history of tobacco use.  Patient quit smoking in July 2017.  --Peripheral vascular disease.  Diagnosed on noninvasive imaging at [facility] in August 2017.       Plan:     --Heparin intravenous bolus and infusion will be started.  -- Continue aspirin, Plavix, and potent statin.  --Topical nitrates.  If the patient develops recurrent chest pain, I would start him on intravenous nitroglycerin.  --Would not start a beta-blocker due to his resting bradycardia.  We will arrange for cardiac catheterization with possible PCI tomorrow morning since he has apparently failed optimal medical therapy.  N.p.o. after midnight except for medications.    Discussed at length with patient who is willing to proceed with repeat procedure.         History of Present Illness:     This is a 36 y.o. male admitted for Unstable angina (HCC);NSTEMI (non-ST elevated myocardial in*.    Patient complains of: Chest pain radiating into his left shoulder and arm.    Patient presented to the emergency room last night complaining of acute onset left-sided chest pain which radiated into his left shoulder and left upper arm.  The pain was severe and started at rest.  It was somewhat reminiscent of his recent MI from 2 months ago.  At that time he had more severe substernal chest pain which radiated down his entire left arm and into his hand with tingling and numbness.  This episode was not quite as severe.    He did undergo a cardiac catheterization in  November 2017 which showed single-vessel CAD with a thrombotic occlusion of an obtuse marginal branch which was not revascularized at that time due to the size.  Optimal medical therapy was started, for which the patient has been compliant initially for the first month, however, the patient reports that over the past 1-2 weeks he has not been as compliant with all of his medications.  He states he is even been exercising regularly 3 days a week and have been doing very well up  until last night.  He does not have any exertional symptoms.    In the emergency room, his initial troponin was unremarkable, his EKG did not show any acute ST or T-wave abnormalities.  However his subsequent troponin level rose to 1.0 this morning.  He states his symptoms have significantly improved, though there is still some soreness in his left arm.  He is currently not having any chest pain.  EKG and chest x-ray were personally reviewed by me and were both unremarkable.        Review of Symptoms:  Except as stated above include:  Constitutional:  negative  Respiratory:  negative  Cardiovascular:  negative  Gastrointestinal: negative  Genitourinary:  negative  Musculoskeletal:  Negative  Neurological:  Negative  Dermatological:  Negative  Endocrinological: Negative  Psychological:  Negative    A comprehensive review of systems was negative except for that written in the HPI.     Past Medical History:     Past Medical History:   Diagnosis Date   ??? Asthma    ??? Chronic pain    ??? Insufficiency, arterial, peripheral (HCC) 11/28/2015    See [facility]   ??? Neuropathy    ??? NSTEMI (non-ST elevated myocardial infarction) (HCC) 02/2016   ??? Pre-diabetes 01/08/2016    A1C 6.2   ??? Sciatica    ??? Vitamin D deficiency          Social History:     Social History     Social History   ??? Marital status: SINGLE     Spouse name: N/A   ??? Number of children: N/A   ??? 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 Smoking status: Former Smoker     Packs/day: 0.50     Types: Cigarettes   ??? Smokeless tobacco: Never Used      Comment: quit about 5 months ago   ??? Alcohol use No      Comment: occasional "pt stated that he stop drinking"   ??? Drug use: No   ??? Sexual activity: Not on file     Other Topics Concern   ??? 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 Blood Transfusions No   ??? Caffeine Concern No   ??? Occupational Exposure No   ??? Hobby Hazards No   ??? Sleep Concern No   ??? Stress  Concern No   ??? Weight Concern No   ??? Special Diet No   ??? Back Care No   ??? Exercise Yes   ??? Bike Helmet No   ??? Seat Belt Yes     Social History Narrative        Family History:     Family History   Problem Relation Age of Onset   ??? Hypertension Mother    ??? Stroke Mother    ??? Hypertension  Father    ??? Asthma Sister         Medications:   No Known Allergies     Current Facility-Administered Medications   Medication Dose Route Frequency   ??? acetaminophen (TYLENOL) tablet 650 mg  650 mg Oral Q4H PRN   ??? HYDROcodone-acetaminophen (NORCO) 10-325 mg tablet 1 Tab  1 Tab Oral Q4H PRN   ??? naloxone (NARCAN) injection 0.4 mg  0.4 mg IntraVENous PRN   ??? diphenhydrAMINE (BENADRYL) injection 12.5 mg  12.5 mg IntraVENous Q4H PRN   ??? ondansetron (ZOFRAN) injection 4 mg  4 mg IntraVENous Q4H PRN   ??? docusate sodium (COLACE) capsule 100 mg  100 mg Oral BID   ??? zolpidem (AMBIEN) tablet 5 mg  5 mg Oral QHS PRN   ??? HYDROmorphone (PF) (DILAUDID) injection 2 mg  2 mg IntraVENous Q4H PRN   ??? sodium chloride (NS) flush 5-10 mL  5-10 mL IntraVENous Q8H   ??? sodium chloride (NS) flush 5-10 mL  5-10 mL IntraVENous PRN   ??? nitroglycerin (NITROBID) 2 % ointment 1 Inch  1 Inch Topical Q6H   ??? heparin 25,000 units in D5W 250 ml infusion  12-25 Units/kg/hr IntraVENous TITRATE   ??? [START ON 05/05/2016] aspirin chewable tablet 81 mg  81 mg Oral DAILY   ??? lisinopril (PRINIVIL, ZESTRIL) tablet 5 mg  5 mg Oral DAILY   ??? atorvastatin (LIPITOR) tablet 40 mg  40 mg Oral QHS   ??? magnesium hydroxide (MILK OF MAGNESIA) 400 mg/5 mL oral suspension 30 mL  30 mL Oral DAILY PRN   ??? clopidogrel (PLAVIX) tablet 75 mg  75 mg Oral DAILY         Physical Exam:     Visit Vitals   ??? BP 127/84 (BP 1 Location: Right arm, BP Patient Position: At rest)   ??? Pulse (!) 57   ??? Temp 97.7 ??F (36.5 ??C)   ??? Resp 16   ??? Ht 5\' 5"  (1.651 m)   ??? Wt 68 kg (150 lb)   ??? SpO2 97%   ??? BMI 24.96 kg/m2     BP Readings from Last 3 Encounters:   05/04/16 127/84   03/22/16 137/90   03/20/16 (!)  134/98     Pulse Readings from Last 3 Encounters:   05/04/16 (!) 57   03/22/16 98   03/20/16 76     Wt Readings from Last 3 Encounters:   05/03/16 68 kg (150 lb)   03/22/16 68 kg (150 lb)   03/20/16 72.1 kg (159 lb)       General:  alert, cooperative, no distress, appears stated age  Neck:  no carotid bruit, no JVD  Lungs:  clear to auscultation bilaterally  Heart:  regular rate and rhythm, S1, S2 normal, no murmur, click, rub or gallop  Abdomen:  abdomen is soft without significant tenderness, masses, organomegaly or guarding  Extremities:  extremities normal, atraumatic, no cyanosis or edema  Skin: Warm and dry. no hyperpigmentation, vitiligo, or suspicious lesions  Neuro: alert, oriented x3, affect appropriate, no focal neurological deficits, moves all extremities well, no involuntary movements  Psych: non focal     Data Review:     Recent Labs      05/04/16   0920  05/03/16   2323   WBC  5.3  6.4   HGB  11.8*  11.3*   HCT  35.3*  33.8*   PLT  253  264     Recent  Labs      05/04/16   0920  05/03/16   2323   NA  141  139   K  3.7  3.5   CL  109*  108   CO2  25  25   GLU  84  110*   BUN  9  10   CREA  0.87  0.97   CA  8.4*  8.8   ALB  3.4   --    SGOT  43*   --    ALT  53   --    INR  1.0   --        Results for orders placed or performed during the hospital encounter of 05/04/16   EKG, 12 LEAD, INITIAL   Result Value Ref Range    Ventricular Rate 59 BPM    Atrial Rate 59 BPM    P-R Interval 146 ms    QRS Duration 88 ms    Q-T Interval 416 ms    QTC Calculation (Bezet) 411 ms    Calculated P Axis 52 degrees    Calculated R Axis 42 degrees    Calculated T Axis 25 degrees    Diagnosis       Sinus bradycardia  Otherwise normal ECG  When compared with ECG of 04-May-2016 03:14,  No significant change was found     Results for orders placed or performed in visit on 03/20/16   AMB POC EKG ROUTINE W/ 12 LEADS, INTER & REP    Narrative    Read by Livingston Diones, MD - Normal sinus rhythm.  LAE.  Non-specific ST-T wave changes.        All Cardiac Markers in the last 24 hours:    Lab Results   Component Value Date/Time    CPK 267 05/03/2016 11:23 PM    CKMB 1.7 05/03/2016 11:23 PM    CKND1 0.6 05/03/2016 11:23 PM    TROIQ 1.00 (H) 05/04/2016 06:26 AM    TROIQ 0.04 05/03/2016 11:23 PM    TNIPOC <0.04 05/04/2016 03:08 AM       Last Lipid:    Lab Results   Component Value Date/Time    Cholesterol, total 167 03/06/2016 10:45 AM    HDL Cholesterol 56 03/06/2016 10:45 AM    LDL, calculated 91.4 03/06/2016 10:45 AM    Triglyceride 98 03/06/2016 10:45 AM    CHOL/HDL Ratio 3.0 03/06/2016 10:45 AM       Signed By: Jerlyn Ly, MD     May 04, 2016

## 2016-05-05 ENCOUNTER — Encounter: Attending: Family | Primary: Family

## 2016-05-05 LAB — METABOLIC PANEL, COMPREHENSIVE
A-G Ratio: 0.7 — ABNORMAL LOW (ref 0.8–1.7)
ALT (SGPT): 60 U/L (ref 16–61)
AST (SGOT): 59 U/L — ABNORMAL HIGH (ref 15–37)
Albumin: 3.3 g/dL — ABNORMAL LOW (ref 3.4–5.0)
Alk. phosphatase: 95 U/L (ref 45–117)
Anion gap: 11 mmol/L (ref 3.0–18)
BUN/Creatinine ratio: 11 — ABNORMAL LOW (ref 12–20)
BUN: 9 MG/DL (ref 7.0–18)
Bilirubin, total: 0.5 MG/DL (ref 0.2–1.0)
CO2: 23 mmol/L (ref 21–32)
Calcium: 8.8 MG/DL (ref 8.5–10.1)
Chloride: 104 mmol/L (ref 100–108)
Creatinine: 0.85 MG/DL (ref 0.6–1.3)
GFR est AA: >60 mL/min/{1.73_m2} (ref >60)
GFR est non-AA: >60 mL/min/{1.73_m2} (ref >60)
Globulin: 4.9 g/dL — ABNORMAL HIGH (ref 2.0–4.0)
Glucose: 94 mg/dL (ref 74–99)
Potassium: 4.3 mmol/L (ref 3.5–5.5)
Protein, total: 8.2 g/dL (ref 6.4–8.2)
Sodium: 138 mmol/L (ref 136–145)

## 2016-05-05 LAB — LIPID PANEL
CHOL/HDL Ratio: 2.4 (ref 0–5.0)
Cholesterol, total: 115 MG/DL (ref <200)
HDL Cholesterol: 47 MG/DL (ref 40–60)
LDL, calculated: 56.8 MG/DL (ref 0–100)
Triglyceride: 56 MG/DL (ref <150)
VLDL, calculated: 11.2 MG/DL

## 2016-05-05 LAB — PTT
aPTT: 58.8 s — ABNORMAL HIGH (ref 23.0–36.4)
aPTT: 86.3 s — ABNORMAL HIGH (ref 23.0–36.4)
aPTT: >180 s — CR (ref 23.0–36.4)

## 2016-05-05 LAB — CBC WITH AUTOMATED DIFF
ABS. BASOPHILS: 0 10*3/uL (ref 0.0–0.1)
ABS. EOSINOPHILS: 0 10*3/uL (ref 0.0–0.4)
ABS. LYMPHOCYTES: 1.6 10*3/uL (ref 0.9–3.6)
ABS. MONOCYTES: 0.6 10*3/uL (ref 0.05–1.2)
ABS. NEUTROPHILS: 4.1 10*3/uL (ref 1.8–8.0)
BASOPHILS: 0 % (ref 0–2)
EOSINOPHILS: 0 % (ref 0–5)
HCT: 39.1 % (ref 36.0–48.0)
HGB: 12.7 g/dL — ABNORMAL LOW (ref 13.0–16.0)
LYMPHOCYTES: 25 % (ref 21–52)
MCH: 26.9 PG (ref 24.0–34.0)
MCHC: 32.5 g/dL (ref 31.0–37.0)
MCV: 82.8 FL (ref 74.0–97.0)
MONOCYTES: 10 % (ref 3–10)
MPV: 11.3 FL (ref 9.2–11.8)
NEUTROPHILS: 65 % (ref 40–73)
PLATELET: 252 10*3/uL (ref 135–420)
RBC: 4.72 M/uL (ref 4.70–5.50)
RDW: 14 % (ref 11.6–14.5)
WBC: 6.3 10*3/uL (ref 4.6–13.2)

## 2016-05-05 LAB — PROTHROMBIN TIME + INR
INR: 1 (ref 0.8–1.2)
Prothrombin time: 12.9 s (ref 11.5–15.2)

## 2016-05-05 LAB — POC ACTIVATED CLOTTING TIME: Activated Clotting Time (POC): 125 SECS (ref 79–138)

## 2016-05-05 LAB — HEMOGLOBIN A1C WITH EAG
Est. average glucose: 137 mg/dL
Hemoglobin A1c: 6.4 % — ABNORMAL HIGH (ref 4.2–5.6)

## 2016-05-05 LAB — TROPONIN I: Troponin-I, QT: 6.97 NG/ML — CR (ref 0.0–0.045)

## 2016-05-05 MED ORDER — ASPIRIN 81 MG TAB, DELAYED RELEASE
81 mg | Freq: Every day | ORAL | Status: DC
Start: 2016-05-05 — End: 2016-05-05

## 2016-05-05 MED ORDER — INSULIN LISPRO 100 UNIT/ML INJECTION
100 unit/mL | Freq: Four times a day (QID) | SUBCUTANEOUS | Status: DC
Start: 2016-05-05 — End: 2016-05-07

## 2016-05-05 MED ORDER — HEPARIN (PORCINE) IN NS (PF) 1,000 UNIT/500 ML IV
1000 unit/500 mL | Freq: Once | INTRAVENOUS | Status: AC
Start: 2016-05-05 — End: 2016-05-05
  Administered 2016-05-05: 16:00:00

## 2016-05-05 MED ORDER — SODIUM CHLORIDE 0.9 % IV PIGGY BACK
250 mg | INTRAVENOUS | Status: DC
Start: 2016-05-05 — End: 2016-05-05
  Administered 2016-05-05: 17:00:00 via INTRAVENOUS

## 2016-05-05 MED ORDER — IOPAMIDOL 61 % IV SOLN
300 mg iodine /mL (61 %) | Freq: Once | INTRAVENOUS | Status: AC
Start: 2016-05-05 — End: 2016-05-05
  Administered 2016-05-05: 17:00:00 via INTRAVENOUS

## 2016-05-05 MED ORDER — NITROGLYCERIN 0.4 MG/DOSE TRANSLINGUAL SPRAY
400 mcg/spray | Status: AC | PRN
Start: 2016-05-05 — End: 2016-05-06

## 2016-05-05 MED ORDER — PANTOPRAZOLE 40 MG TAB, DELAYED RELEASE
40 mg | Freq: Every day | ORAL | Status: DC
Start: 2016-05-05 — End: 2016-05-08
  Administered 2016-05-06 – 2016-05-08 (×3): via ORAL

## 2016-05-05 MED ORDER — NITROGLYCERIN 2 MG/5 ML (400 MCG/ML) COMPOUNDED INJ HR
400 mcg/mL | Freq: Once | Status: AC
Start: 2016-05-05 — End: 2016-05-05
  Administered 2016-05-05: 17:00:00 via INTRACORONARY

## 2016-05-05 MED ORDER — MIDAZOLAM 1 MG/ML IJ SOLN
1 mg/mL | INTRAMUSCULAR | Status: DC | PRN
Start: 2016-05-05 — End: 2016-05-05
  Administered 2016-05-05 (×2): via INTRAVENOUS

## 2016-05-05 MED ORDER — METOPROLOL TARTRATE 50 MG TAB
50 mg | Freq: Three times a day (TID) | ORAL | Status: DC
Start: 2016-05-05 — End: 2016-05-06
  Administered 2016-05-05 – 2016-05-06 (×3): via ORAL

## 2016-05-05 MED ORDER — SODIUM CHLORIDE 0.9 % IJ SYRG
Freq: Three times a day (TID) | INTRAMUSCULAR | Status: DC
Start: 2016-05-05 — End: 2016-05-05
  Administered 2016-05-05: 19:00:00 via INTRAVENOUS

## 2016-05-05 MED ORDER — GABAPENTIN 300 MG CAP
300 mg | Freq: Three times a day (TID) | ORAL | Status: DC
Start: 2016-05-05 — End: 2016-05-05

## 2016-05-05 MED ORDER — SODIUM CHLORIDE 0.45 % IV
0.45 % | INTRAVENOUS | Status: DC
Start: 2016-05-05 — End: 2016-05-05
  Administered 2016-05-05: 18:00:00 via INTRAVENOUS

## 2016-05-05 MED ORDER — SODIUM CHLORIDE 0.9 % IJ SYRG
INTRAMUSCULAR | Status: DC | PRN
Start: 2016-05-05 — End: 2016-05-08

## 2016-05-05 MED ORDER — GLUCAGON 1 MG INJECTION
1 mg | INTRAMUSCULAR | Status: DC | PRN
Start: 2016-05-05 — End: 2016-05-08

## 2016-05-05 MED ORDER — DEXTROSE 50% IN WATER (D50W) IV SYRG
INTRAVENOUS | Status: DC | PRN
Start: 2016-05-05 — End: 2016-05-08

## 2016-05-05 MED ORDER — PREGABALIN 75 MG CAP
75 mg | Freq: Two times a day (BID) | ORAL | Status: DC
Start: 2016-05-05 — End: 2016-05-08
  Administered 2016-05-05 – 2016-05-08 (×7): via ORAL

## 2016-05-05 MED ORDER — HEPARIN (PORCINE) 1,000 UNIT/ML IJ SOLN
1000 unit/mL | Freq: Once | INTRAMUSCULAR | Status: AC
Start: 2016-05-05 — End: 2016-05-05
  Administered 2016-05-05: 17:00:00 via INTRAVENOUS

## 2016-05-05 MED ORDER — BIVALIRUDIN 250 MG SOLUTION
250 mg | Freq: Once | INTRAVENOUS | Status: AC
Start: 2016-05-05 — End: 2016-05-05
  Administered 2016-05-05: 17:00:00 via INTRAVENOUS

## 2016-05-05 MED ORDER — GLUCOSE 4 GRAM CHEWABLE TAB
4 gram | ORAL | Status: DC | PRN
Start: 2016-05-05 — End: 2016-05-08

## 2016-05-05 MED ORDER — FENTANYL CITRATE (PF) 50 MCG/ML IJ SOLN
50 mcg/mL | INTRAMUSCULAR | Status: DC | PRN
Start: 2016-05-05 — End: 2016-05-05
  Administered 2016-05-05: 16:00:00 via INTRAVENOUS

## 2016-05-05 MED ORDER — VERAPAMIL 2.5 MG/ML IV
2.5 mg/mL | Freq: Once | INTRAVENOUS | Status: AC
Start: 2016-05-05 — End: 2016-05-05
  Administered 2016-05-05: 17:00:00 via INTRAVENOUS

## 2016-05-05 MED ORDER — LIDOCAINE (PF) 10 MG/ML (1 %) IJ SOLN
10 mg/mL (1 %) | INTRAMUSCULAR | Status: DC | PRN
Start: 2016-05-05 — End: 2016-05-05
  Administered 2016-05-05: 17:00:00 via INTRADERMAL

## 2016-05-05 MED FILL — DOCUSATE SODIUM 100 MG CAP: 100 mg | ORAL | Qty: 1

## 2016-05-05 MED FILL — LYRICA 75 MG CAPSULE: 75 mg | ORAL | Qty: 1

## 2016-05-05 MED FILL — NITROGLYCERIN 0.4 MG/DOSE TRANSLINGUAL SPRAY: 400 mcg/spray | Qty: 4.9

## 2016-05-05 MED FILL — HYDROMORPHONE (PF) 1 MG/ML IJ SOLN: 1 mg/mL | INTRAMUSCULAR | Qty: 2

## 2016-05-05 MED FILL — LISINOPRIL 5 MG TAB: 5 mg | ORAL | Qty: 1

## 2016-05-05 MED FILL — NITROGLYCERIN 2 MG/5 ML (400 MCG/ML) COMPOUNDED INJ HR: 400 mcg/mL | Qty: 5

## 2016-05-05 MED FILL — ISOVUE-300  61 % INTRAVENOUS SOLUTION: 300 mg iodine /mL (61 %) | INTRAVENOUS | Qty: 200

## 2016-05-05 MED FILL — LIDOCAINE (PF) 10 MG/ML (1 %) IJ SOLN: 10 mg/mL (1 %) | INTRAMUSCULAR | Qty: 30

## 2016-05-05 MED FILL — METOPROLOL TARTRATE 50 MG TAB: 50 mg | ORAL | Qty: 1

## 2016-05-05 MED FILL — FENTANYL CITRATE (PF) 50 MCG/ML IJ SOLN: 50 mcg/mL | INTRAMUSCULAR | Qty: 2

## 2016-05-05 MED FILL — ASPIRIN 81 MG CHEWABLE TAB: 81 mg | ORAL | Qty: 1

## 2016-05-05 MED FILL — MIDAZOLAM 1 MG/ML IJ SOLN: 1 mg/mL | INTRAMUSCULAR | Qty: 2

## 2016-05-05 MED FILL — BD POSIFLUSH NORMAL SALINE 0.9 % INJECTION SYRINGE: INTRAMUSCULAR | Qty: 10

## 2016-05-05 MED FILL — ATORVASTATIN 40 MG TAB: 40 mg | ORAL | Qty: 1

## 2016-05-05 MED FILL — CLOPIDOGREL 75 MG TAB: 75 mg | ORAL | Qty: 1

## 2016-05-05 MED FILL — SODIUM CHLORIDE 0.45 % IV: 0.45 % | INTRAVENOUS | Qty: 200

## 2016-05-05 MED FILL — NITRO-BID 2 % TRANSDERMAL OINTMENT: 2 % | TRANSDERMAL | Qty: 1

## 2016-05-05 MED FILL — VERAPAMIL 2.5 MG/ML IV: 2.5 mg/mL | INTRAVENOUS | Qty: 2

## 2016-05-05 NOTE — Progress Notes (Addendum)
NUTRITION    Nutrition Consult: Diet Education    RECOMMENDATIONS / PLAN:     - Will re-attempt to provide explanation/counseling in addition to the handouts already provided.  - Continue current nutrition interventions.    NUTRITION INTERVENTIONS:      Meals/Snacks: modified composition   Nutrition counseling/education: diabetic, consistent carbohydrate diet handouts provided 1/22    ASSESSMENT:     Diet: DIET CARDIAC Regular  Po intake:    Good      Fair      Poor  Weight History:   No unintentional weight loss PTA     Had weight change:    Nutrition Risk: None identified at this time     Provided Education On: Diabetic diet. Pt with pre-diabetes  Person(s) Instructed:    Patient     Family     Other:  Education Methods Used:   Explanation     Handout     Other:   Patients Readiness:   Engineer, agriculturalager     Acceptance of handouts     Non-Acceptance     Refused explanation  Learning Limitations:    None identified     Identified:  Level of Comprehension:   Good     Needs Reinforcement     Additional Concerns/Comments: Pt denied explanation of diet education but handouts provided. Pt agreeable to explanation/counseling at a later time. Plan to re-attempt. Pt to call if he has any questions about handouts. RD contact information provided.     Follow-up education indicated:    No     Yes  Discharge needs:  None identified     Identified and addressed    De HollingsheadMegan M Schorr, RD   Pager: [phone]

## 2016-05-05 NOTE — Progress Notes (Signed)
Pt brought to [facility] via bed.  Pt placed in bay 2 and connected to monitor. Baseline VS taken and PIV flushed for patency x2. Pre procedure database completed. Pt ready for ordered procedure.

## 2016-05-05 NOTE — Progress Notes (Signed)
SW INITIAL NOTE: SW met with pt to complete initial assessment. Pt requested assistance with medication and specifically said Lyrica.     Care Management Interventions  PCP Verified by CM: Yes [facility] saw NP William Griffin 2 months ago)  Palliative Care Criteria Met (RRAT>21 & CHF Dx)?: No  Mode of Transport at Discharge: BLS  Physical Therapy Consult: No  Occupational Therapy Consult: No  Speech Therapy Consult: No  Current Support Network: Other (Pt reported he lives with friends)  Confirm Follow Up Transport: Friends (Pt reported friends from church can provide transport)  Discharge Location  Discharge Placement: Home      SW will assist with discharge planning/purposes.    William Griffin, MSW LSW

## 2016-05-05 NOTE — Other (Signed)
TRANSFER - OUT REPORT:    Verbal report given to Jonita AlbeeJohn Thomas, RN(name) [facility] for routine progression of care       Report consisted of patient???s Situation, Background, Assessment and   Recommendations(SBAR).     Information from the following report(s) SBAR, Kardex, Procedure Summary and MAR was reviewed with the receiving nurse.    Lines:   Peripheral IV 05/04/16 Left Hand (Active)   Site Assessment Clean, dry, & intact 05/05/2016  8:00 AM   Phlebitis Assessment 0 05/05/2016  8:00 AM   Infiltration Assessment 0 05/05/2016  8:00 AM   Dressing Status Clean, dry, & intact 05/05/2016  8:00 AM   Dressing Type Transparent 05/05/2016  8:00 AM   Action Taken Open ports on tubing capped 05/05/2016  8:00 AM       Peripheral IV 05/05/16 Right Arm (Active)   Site Assessment Clean, dry, & intact 05/05/2016  8:00 AM   Phlebitis Assessment 0 05/05/2016  8:00 AM   Infiltration Assessment 0 05/05/2016  8:00 AM   Dressing Status Clean, dry, & intact 05/05/2016  8:00 AM   Dressing Type Transparent 05/05/2016  8:00 AM   Action Taken Open ports on tubing capped 05/05/2016  8:00 AM        Opportunity for questions and clarification was provided.      Patient transported with:   [employer]

## 2016-05-05 NOTE — Other (Signed)
Cardiac rehab by to see patient. Educational information given and reviewed on relaxation, exercise, and nutrition. Also information reviewed and given on the outpatient cardiac rehab program. Patient very sleepy at present. Will follow up at a later time.

## 2016-05-05 NOTE — Other (Signed)
Bedside and Verbal shift change report given to Margret, RN (oncoming nurse) by Eduard ClosZaneta L Hobbs, RN   (offgoing nurse). Report included the following information SBAR, Kardex, Intake/Output and MAR.

## 2016-05-05 NOTE — Progress Notes (Signed)
[facility] Hospitalist Group  Progress Note    Patient: William Griffin Age: 36 y.o. DOB: November 11, 1980 MR#: [id number] SSN: [id number]  Date/Time: 05/05/2016 12:39 PM    Subjective/24-hours:     Just recently transferred to [facility] area following cath.  Complaining of shoulder pain, no acute SOB.Marland Kitchen    Assessment:   NSTEMI  CAD  HTN  PAD  Tobacco abuse    Plan:  Cath results noted - unable to intervene on OM 2 coronary due to tortuosity of vessel.  Medical management of CAD as ordered.  Modification of risk factors.  Analgesia as needed.   Anticipate disposition soon.    Case discussed with:  Patient  Family  Nursing  Case Management  DVT Prophylaxis:  Lovenox  Hep SQ  SCDs  Coumadin   On Heparin gtt    Objective:   VS:   Visit Vitals   ??? BP 120/72   ??? Pulse 74   ??? Temp 98.2 ??F (36.8 ??C)   ??? Resp 26   ??? Ht '5\' 5"'  (1.651 m)   ??? Wt 72.5 kg (159 lb 12.8 oz)   ??? SpO2 98%   ??? BMI 26.59 kg/m2      Tmax/24hrs: Temp (24hrs), Avg:97.7 ??F (36.5 ??C), Min:97 ??F (36.1 ??C), Max:98.2 ??F (36.8 ??C)    Intake/Output Summary (Last 24 hours) at 05/05/16 1239  Last data filed at 05/05/16 1228   Gross per 24 hour   Intake              600 ml   Output              950 ml   Net             -350 ml       General:  In NAD.  Cardiovascular: RRR.   Pulmonary:  Clear, no wheezes,  Effort nonlabored.  GI:  Abdomen soft, NTTP.  Extremities:  Warm, no ischemia.    Neuro:  Awake and alert, motor grossly nonfocal.    Labs:    Recent Results (from the past 24 hour(s))   PTT    Collection Time: 05/04/16  6:28 PM   Result Value Ref Range    aPTT 58.8 (H) 23.0 - 78.2 SEC   METABOLIC PANEL, COMPREHENSIVE    Collection Time: 05/05/16  2:37 AM   Result Value Ref Range    Sodium 138 136 - 145 mmol/L    Potassium 4.3 3.5 - 5.5 mmol/L    Chloride 104 100 - 108 mmol/L    CO2 23 21 - 32 mmol/L    Anion gap 11 3.0 - 18 mmol/L    Glucose 94 74 - 99 mg/dL    BUN 9 7.0 - 18 MG/DL    Creatinine 0.85 0.6 - 1.3 MG/DL     BUN/Creatinine ratio 11 (L) 12 - 20      GFR est AA >60 >60 ml/min/1.11m    GFR est non-AA >60 >60 ml/min/1.732m   Calcium 8.8 8.5 - 10.1 MG/DL    Bilirubin, total 0.5 0.2 - 1.0 MG/DL    ALT (SGPT) 60 16 - 61 U/L    AST (SGOT) 59 (H) 15 - 37 U/L    Alk. phosphatase 95 45 - 117 U/L    Protein, total 8.2 6.4 - 8.2 g/dL    Albumin 3.3 (L) 3.4 - 5.0 g/dL    Globulin 4.9 (H) 2.0 - 4.0 g/dL  A-G Ratio 0.7 (L) 0.8 - 1.7     CBC WITH AUTOMATED DIFF    Collection Time: 05/05/16  2:37 AM   Result Value Ref Range    WBC 6.3 4.6 - 13.2 K/uL    RBC 4.72 4.70 - 5.50 M/uL    HGB 12.7 (L) 13.0 - 16.0 g/dL    HCT 39.1 36.0 - 48.0 %    MCV 82.8 74.0 - 97.0 FL    MCH 26.9 24.0 - 34.0 PG    MCHC 32.5 31.0 - 37.0 g/dL    RDW 14.0 11.6 - 14.5 %    PLATELET 252 135 - 420 K/uL    MPV 11.3 9.2 - 11.8 FL    NEUTROPHILS 65 40 - 73 %    LYMPHOCYTES 25 21 - 52 %    MONOCYTES 10 3 - 10 %    EOSINOPHILS 0 0 - 5 %    BASOPHILS 0 0 - 2 %    ABS. NEUTROPHILS 4.1 1.8 - 8.0 K/UL    ABS. LYMPHOCYTES 1.6 0.9 - 3.6 K/UL    ABS. MONOCYTES 0.6 0.05 - 1.2 K/UL    ABS. EOSINOPHILS 0.0 0.0 - 0.4 K/UL    ABS. BASOPHILS 0.0 0.0 - 0.1 K/UL    DF AUTOMATED     PROTHROMBIN TIME + INR    Collection Time: 05/05/16  2:37 AM   Result Value Ref Range    Prothrombin time 12.9 11.5 - 15.2 sec    INR 1.0 0.8 - 1.2     PTT    Collection Time: 05/05/16  2:37 AM   Result Value Ref Range    aPTT >180.0 (HH) 23.0 - 36.4 SEC   LIPID PANEL    Collection Time: 05/05/16  2:37 AM   Result Value Ref Range    LIPID PROFILE          Cholesterol, total 115 <200 MG/DL    Triglyceride 56 <150 MG/DL    HDL Cholesterol 47 40 - 60 MG/DL    LDL, calculated 56.8 0 - 100 MG/DL    VLDL, calculated 11.2 MG/DL    CHOL/HDL Ratio 2.4 0 - 5.0     TROPONIN I    Collection Time: 05/05/16  2:37 AM   Result Value Ref Range    Troponin-I, Qt. 6.97 (HH) 0.0 - 0.045 NG/ML   PTT    Collection Time: 05/05/16  5:43 AM   Result Value Ref Range    aPTT 86.3 (H) 23.0 - 36.4 SEC        Signed By: Ignacia Bayley, MD     May 05, 2016 12:39 PM

## 2016-05-05 NOTE — Progress Notes (Signed)
1350- d stat band from right wrist removed after gradual release of air per protocol. No bleeding or hematoma present.  Neurovascular checks WDl. Pt educated on restrictions, watching site, and when to call a nurse.

## 2016-05-05 NOTE — Procedures (Signed)
CARDIAC CATHETERIZATION LABORATORY PROCEDURE REPORT    William Griffin is a 36 y.o. male    Medical Record Number: [id number]    Primary Care Provider: Desiree Lucy, NP      Referral Provider: No ref. provider found    Procedure Date: 05/05/2016    INDICATIONS: Patient with known small OM 2 occlusion noted back in November 2017.  He comes back in the hospital with recurrent angina.    MD PERFORMING PROCEDURE: Dontre Laduca K Fischer Halley, MD    PROCEDURE:  Left heart catheterization, coronary angiography, left ventriculography and coronary artery stenting.    ANESTHESIA: Moderate sedation and local anesthesia.    EBL: 30-50 ml  Contrast: 135 ml  Radiation exposure: 1861 mGy    Risks, benefits, and alternatives were explained to the patient and appropriate informed consent was obtained prior to the procedure. The patient was brought to the cath lab in a post-absorptive state and he was prepped and draped in the usual sterile manner.  Moderate sedation was achieved with the combination of Versed (midazolam) and Fentanyl. Lidocaine was used to secure local anesthesia. The right radial artery was cannulated using a micropuncture kit and a 3F [place] sheath was inserted. The patient received 3000 units of IV Heparin bolus as well as 5 mg Verapamil and 200 microgram NTG through the sheath to prevent arterial vasospasm. Six [place] Jackie catheter and XB 3.0 were used to obtain selective bilateral coronary angiograms in multiple projections.  LV angiography was not performed. The following were observed.    FLUOROSCOPY: There was no apparent calcification.    HEMODYNAMIC / ANGIOGRAPHIC DATA    ?? Coronary Angiography:  ?? The coronary circulation was left dominant.   ?? Left main: Angiographically normal.   ?? Left anterior descending: Angiographically normal.  ?? Diagonal Branches: Angiographically normal.  ?? Circumflex: Angiographically normal.  ?? Obtuse Marginal Branches: Distal OM 2 complete occlusion without significant collaterals.   ?? Right coronary: Angiographically normal.    INTERVENTION:  A weight adjusted loading dose of IV Angiomax was given and infusion of the drug was started . Using a 6 [place] AL-1 guiding catheter, the OM 2 coronary artery could not be entered with a 0.014" balance guide wire, due to multiple severe, acute turns.  Despite using 2.0 mm balloon for support, the hairpin turns could not be overcome.  After multiple attempts, the procedure was aborted.  There was no balloon dilatation.  There was no attempt at crossing the lesion itself. Intracoronary NTG was used during the procedure. TIMI III flow was always maintained.  After an appropriate waiting phase final coronary angiograms were obtained and the catheter was withdrawn.  The procedure was well tolerated and there was no apparent immediate complication. The sheath was removed and hemostasis was accomplished using D-Stat Hemoband. The patient was transferred to the observation area with stable vital signs and in an asymptomatic state.    SUMMARY: The OM 2 could not be entered with a coronary wire. No apparent immediate complication.    Moderate sedation was utilized for 49 minutes using Versed and fentanyl under my supervision.    RECOMMENDATIONS:  Post-procedure care will focus on aggressive risk factor modification.     Benefits of tobacco abstinence discussed.    Electronically signed Lowell Guitar, MD

## 2016-05-05 NOTE — Progress Notes (Signed)
05/05/16 1234   Vital Signs   Pulse (Heart Rate) 88   Heart Rate Source Monitor   Cardiac Rhythm NSR   Resp Rate 22   O2 Sat (%) 100 %   Level of Consciousness Alert   BP (!) 148/102   MAP (Monitor) 125   Oxygen Therapy   O2 Device Room air   Modified Aldrete Score   Activity 2   Respiration 2   Circulation 2   Consciousness 2   O2 Saturation 2   Aldrete Score 10   Neuro   Neuro (WDL) WDL   Post-Procedure Site Assessment (1)   Wound Type Puncture   Location Radial   Orientation  Right   Hemostasis  DSTAT pad   Site Assessment No bleeding;No hematoma   Patient  Assessment   Skin Color Appropriate for ethnicity   Skin Condition/Temp Warm   Skin Integrity Intact   Peripheral Vascular   Peripheral Vascular (WDL) WDL   Activity   Activity Level Bed Rest   received patient from cath lab. Pt alert oriented x4.  Right radial site with d stat band in place.  Neurovascular checks wdl

## 2016-05-05 NOTE — Other (Signed)
TRANSFER - IN REPORT:    Verbal report received from Suzy Rn(name) on William Griffin  being received from [facility](unit) for routine progression of care      Report consisted of patient???s Situation, Background, Assessment and   Recommendations(SBAR).     Information from the following report(s) SBAR, Procedure Summary and MAR was reviewed with the receiving nurse.    Opportunity for questions and clarification was provided.      Assessment completed upon patient???s arrival to unit and care assumed.

## 2016-05-05 NOTE — Progress Notes (Signed)
0700--Bedside shift change report given to M. Lorella NimrodHarvey, RN (oncoming nurse) by Basil DessZaneta, RN (offgoing nurse). Report included the following information SBAR, Kardex, MAR, Recent Results and Cardiac Rhythm NSR. Pt sitting up, medicated for pain.     1015--Pt off floor to Cath lab. Melania given report prior to leaving floor. Heparin gtt infusing.     1445--Pt returned to cath lab. No Heparin gtt infusing. Orders clarified with MD. Pt requesting Lyrica BID.     1900--Bedside shift change report given to April, RN (oncoming nurse) by Sanjuana MaeM. Harvey, RN (offgoing nurse). Report included the following information SBAR, Kardex, MAR, Recent Results and Cardiac Rhythm NSR.

## 2016-05-05 NOTE — Procedures (Signed)
CARDIAC CATHETERIZATION LABORATORY PROCEDURE REPORT    William Griffin is a 36 y.o. male    Medical Record Number: [id number]    Primary Care Provider: Desiree Lucy, NP      Referral Provider: No ref. provider found    Procedure Date: 05/05/2016    INDICATIONS: Patient with known small OM 2 occlusion noted back in November 2017.  He comes back in the hospital with recurrent angina.    MD PERFORMING PROCEDURE: Romey Mathieson K Wana Mount, MD    PROCEDURE:  Left heart catheterization, coronary angiography, left ventriculography and coronary artery stenting.    ANESTHESIA: Moderate sedation and local anesthesia.    EBL: 30-50 ml  Contrast: 135 ml  Radiation exposure: 1861 mGy    Risks, benefits, and alternatives were explained to the patient and appropriate informed consent was obtained prior to the procedure. The patient was brought to the cath lab in a post-absorptive state and he was prepped and draped in the usual sterile manner.  Moderate sedation was achieved with the combination of Versed (midazolam) and Fentanyl. Lidocaine was used to secure local anesthesia. The right radial artery was cannulated using a micropuncture kit and a 43F [place] sheath was inserted. The patient received 3000 units of IV Heparin bolus as well as 5 mg Verapamil and 200 microgram NTG through the sheath to prevent arterial vasospasm. Six [place] Jackie catheter and XB 3.0 were used to obtain selective bilateral coronary angiograms in multiple projections.  LV angiography was not performed. The following were observed.    FLUOROSCOPY: There was no apparent calcification.    HEMODYNAMIC / ANGIOGRAPHIC DATA    ?? Coronary Angiography:  ?? The coronary circulation was left dominant.   ?? Left main: Angiographically normal.   ?? Left anterior descending: Angiographically normal.  ?? Diagonal Branches: Angiographically normal.  ?? Circumflex: Angiographically normal.  ?? Obtuse Marginal Branches: Distal OM 2 complete occlusion without significant  collaterals.  ?? Right coronary: Angiographically normal.    INTERVENTION:  A weight adjusted loading dose of IV Angiomax was given and infusion of the drug was started . Using a 6 [place] AL-1 guiding catheter, the OM 2 coronary artery could not be entered with a 0.014" balance guide wire, due to multiple severe, acute turns.  Despite using 2.0 mm balloon for support, the hairpin turns could not be overcome.  After multiple attempts, the procedure was aborted.  There was no balloon dilatation.  There was no attempt at crossing the lesion itself. Intracoronary NTG was used during the procedure. TIMI III flow was always maintained.  After an appropriate waiting phase final coronary angiograms were obtained and the catheter was withdrawn.  The procedure was well tolerated and there was no apparent immediate complication. The sheath was removed and hemostasis was accomplished using D-Stat Hemoband. The patient was transferred to the observation area with stable vital signs and in an asymptomatic state.    SUMMARY: The OM 2 could not be entered with a coronary wire. No apparent immediate complication.    Moderate sedation was utilized for 49 minutes using Versed and fentanyl under my supervision.    RECOMMENDATIONS:  Post-procedure care will focus on aggressive risk factor modification.     Benefits of tobacco abstinence discussed.    Electronically signed Lowell Guitar, MD

## 2016-05-06 LAB — EKG 12-LEAD
Atrial Rate: 53 {beats}/min
P Axis: 53 degrees
P-R Interval: 150 ms
Q-T Interval: 382 ms
QRS Duration: 94 ms
QTc Calculation (Bazett): 358 ms
R Axis: 56 degrees
T Axis: 40 degrees
Ventricular Rate: 53 {beats}/min

## 2016-05-06 LAB — METABOLIC PANEL, BASIC
Anion gap: 4 mmol/L (ref 3.0–18)
BUN/Creatinine ratio: 11 — ABNORMAL LOW (ref 12–20)
BUN: 10 MG/DL (ref 7.0–18)
CO2: 30 mmol/L (ref 21–32)
Calcium: 9.3 MG/DL (ref 8.5–10.1)
Chloride: 105 mmol/L (ref 100–108)
Creatinine: 0.88 MG/DL (ref 0.6–1.3)
GFR est AA: >60 mL/min/{1.73_m2} (ref >60)
GFR est non-AA: >60 mL/min/{1.73_m2} (ref >60)
Glucose: 117 mg/dL — ABNORMAL HIGH (ref 74–99)
Potassium: 4.3 mmol/L (ref 3.5–5.5)
Sodium: 139 mmol/L (ref 136–145)

## 2016-05-06 LAB — EKG, 12 LEAD, SUBSEQUENT
Atrial Rate: 53 {beats}/min
Calculated P Axis: 53 degrees
Calculated R Axis: 56 degrees
Calculated T Axis: 40 degrees
P-R Interval: 150 ms
Q-T Interval: 382 ms
QRS Duration: 94 ms
QTC Calculation (Bezet): 358 ms
Ventricular Rate: 53 {beats}/min

## 2016-05-06 LAB — GLUCOSE, POC
Glucose (POC): 106 mg/dL (ref 70–110)
Glucose (POC): 94 mg/dL (ref 70–110)
Glucose (POC): 96 mg/dL (ref 70–110)
Glucose (POC): 128 mg/dL — ABNORMAL HIGH (ref 70–110)

## 2016-05-06 LAB — PTT: aPTT: 30.9 s (ref 23.0–36.4)

## 2016-05-06 LAB — CARDIAC PANEL,(CK, CKMB & TROPONIN)
CK - MB: 11.8 ng/ml — ABNORMAL HIGH (ref <3.6)
CK-MB Index: 4.5 % — ABNORMAL HIGH (ref 0.0–4.0)
CK: 263 U/L (ref 39–308)
Troponin-I, QT: 13.8 NG/ML — CR (ref 0.0–0.045)

## 2016-05-06 MED ORDER — ISOSORBIDE MONONITRATE SR 30 MG 24 HR TAB
30 mg | Freq: Every day | ORAL | Status: DC
Start: 2016-05-06 — End: 2016-05-08
  Administered 2016-05-06 – 2016-05-08 (×3): via ORAL

## 2016-05-06 MED ORDER — METOPROLOL TARTRATE 25 MG TAB
25 mg | Freq: Two times a day (BID) | ORAL | Status: DC
Start: 2016-05-06 — End: 2016-05-08
  Administered 2016-05-06 – 2016-05-08 (×5): via ORAL

## 2016-05-06 MED ORDER — RANOLAZINE SR 500 MG 12 HR TAB
500 mg | Freq: Two times a day (BID) | ORAL | Status: DC
Start: 2016-05-06 — End: 2016-05-08
  Administered 2016-05-06 – 2016-05-08 (×5): via ORAL

## 2016-05-06 MED ORDER — HYDROCODONE-ACETAMINOPHEN 10 MG-325 MG TAB
10-325 mg | ORAL_TABLET | ORAL | 0 refills | Status: DC | PRN
Start: 2016-05-06 — End: 2016-05-08

## 2016-05-06 MED ORDER — PREGABALIN 100 MG CAP
100 mg | ORAL_CAPSULE | Freq: Two times a day (BID) | ORAL | 1 refills | Status: AC
Start: 2016-05-06 — End: ?

## 2016-05-06 MED ORDER — AMLODIPINE 5 MG TAB
5 mg | Freq: Every day | ORAL | Status: DC
Start: 2016-05-06 — End: 2016-05-08
  Administered 2016-05-07 – 2016-05-08 (×2): via ORAL

## 2016-05-06 MED ORDER — ISOSORBIDE MONONITRATE SR 30 MG 24 HR TAB
30 mg | ORAL_TABLET | Freq: Every day | ORAL | 0 refills | Status: AC
Start: 2016-05-06 — End: ?

## 2016-05-06 MED FILL — RANEXA 500 MG TABLET,EXTENDED RELEASE: 500 mg | ORAL | Qty: 1

## 2016-05-06 MED FILL — LYRICA 75 MG CAPSULE: 75 mg | ORAL | Qty: 1

## 2016-05-06 MED FILL — PANTOPRAZOLE 40 MG TAB, DELAYED RELEASE: 40 mg | ORAL | Qty: 1

## 2016-05-06 MED FILL — HYDROMORPHONE (PF) 1 MG/ML IJ SOLN: 1 mg/mL | INTRAMUSCULAR | Qty: 2

## 2016-05-06 MED FILL — METOPROLOL TARTRATE 25 MG TAB: 25 mg | ORAL | Qty: 1

## 2016-05-06 MED FILL — DOCUSATE SODIUM 100 MG CAP: 100 mg | ORAL | Qty: 1

## 2016-05-06 MED FILL — METOPROLOL TARTRATE 50 MG TAB: 50 mg | ORAL | Qty: 1

## 2016-05-06 MED FILL — ASPIRIN 81 MG CHEWABLE TAB: 81 mg | ORAL | Qty: 1

## 2016-05-06 MED FILL — ATORVASTATIN 40 MG TAB: 40 mg | ORAL | Qty: 1

## 2016-05-06 MED FILL — CLOPIDOGREL 75 MG TAB: 75 mg | ORAL | Qty: 1

## 2016-05-06 MED FILL — NITRO-BID 2 % TRANSDERMAL OINTMENT: 2 % | TRANSDERMAL | Qty: 1

## 2016-05-06 MED FILL — ISOSORBIDE MONONITRATE SR 30 MG 24 HR TAB: 30 mg | ORAL | Qty: 1

## 2016-05-06 MED FILL — LISINOPRIL 5 MG TAB: 5 mg | ORAL | Qty: 1

## 2016-05-06 NOTE — Other (Signed)
Cardiac rehab in to see patient. No questions at the present time. Will continue to follow.

## 2016-05-06 NOTE — Progress Notes (Signed)
NUTRITION    Nutrition Consult: Diet Education    RECOMMENDATIONS / PLAN:     No additional nutritional interventions indicated at this time.  Please consult nutrition if further nutrtion intervention is needed.  Will re-screen this patient per policy.    NUTRITION INTERVENTIONS:      Meals/Snacks: modified composition   Nutrition counseling/education: cardiac, diabetic diet education follow up 05/06/16    ASSESSMENT:     Diet: DIET CARDIAC Regular  Po intake:    Good      Fair      Poor  Weight History:   No unintentional weight loss PTA     Had weight change:    Nutrition Risk: None identified at this time     Provided Education On: Diabetic and cardiac diet restrictions. Pt with pre-diabetes  Person(s) Instructed:    Patient     Family     Other:  Education Methods Used:   Explanation     Handout     Other:   Patients Readiness:   Engineer, agriculturalager     Acceptance     Non-Acceptance     Refused explanation  Learning Limitations:    None identified     Identified:  Level of Comprehension:   Good     Needs Reinforcement     Additional Concerns/Comments: Discussed importance of heart healthy and consistent carbohydrate diet restrictions to prevent diabetes and reduce risk of cardiovascular disease. Pt receptive to education. No questions or concerns at this time, handouts and RD contact information provided.     Follow-up education indicated:    No     Yes  Discharge needs:  None identified     Identified and addressed    Exie ParodyStephanie Preece, RD, CNSC   Pager: [phone]

## 2016-05-06 NOTE — Other (Signed)
Bedside shift change report given to Kari, RN (oncoming nurse) by April M Roser (offgoing nurse). Report included the following information SBAR, Kardex, ED Summary, Intake/Output, MAR, Accordion and Recent Results.

## 2016-05-06 NOTE — Other (Signed)
Bedside shift change report given to April, RN (oncoming nurse) by Kari, RN (offgoing nurse). Report included the following information SBAR, Kardex and Recent Results.

## 2016-05-06 NOTE — Progress Notes (Signed)
[facility] - Progress Note  Admit Date: 05/04/2016    Assessment:     Hospital Problems  Date Reviewed: March 30, 2016          Codes Class Noted POA    Unstable angina (HCC) ICD-10-CM: I20.0  ICD-9-CM: 411.1  05/04/2016 Unknown        NSTEMI (non-ST elevated myocardial infarction) [facility]) ICD-10-CM: I21.4  ICD-9-CM: 410.70  03/05/2016 Unknown              ??  -- Acute NSTEMI.  Patient's troponin level rose to 6.97. Patient's symptoms consistent with an acute coronary syndrome.  He did not have any new EKG changes, so I suspect that his prior circumflex/obtuse marginal branch vessel disease continues to be the culprit. Unfortunately unable to enter OM2 lesion with coronary wire this admission which will be medically managed. Remainder of coronaries patent.  --Single-vessel coronary disease.  Patient underwent a cardiac catheterization 2 months ago when he presented with an acute MI and was found to have an occluded obtuse marginal branch which was felt to be too small for revascularization. Unfortunately unable to cross this admission.  Normal left ventricular ejection fraction on echocardiogram in November 2017.  No significant change or new regional wall motion abnormality seen on limited echocardiogram this admission.  Patient had been medically treated over the past 2 months with dual antiplatelet therapy and dual antianginal therapy.  He was never started on a beta-blocker because of bradycardia.  --Essential hypertension.  This appears to be reasonably well-controlled.  --Prior history of tobacco use.  Patient quit smoking in July 2017.  --Peripheral vascular disease.  Diagnosed on noninvasive imaging at [facility] in August 2017.  ??    Plan:     Patient still with CP overnight. Constant left shoulder pain and intermittent substernal CP.  Continue ASA, plavix, statin.  BP elevated, imdur started, continued on lisinopril.  Historically not able to tolerate BB due to bradycardia, will decrease dose.     Subjective:     Chest pain overnight as noted above    Objective:      Patient Vitals for the past 8 hrs:   Temp Pulse Resp BP SpO2   05/06/16 0754 97.7 ??F (36.5 ??C) 64 20 (!) 141/95 96 %   05/06/16 0438 97.1 ??F (36.2 ??C) 60 20 (!) 139/93 100 %         Patient Vitals for the past 96 hrs:   Weight   05/06/16 0438 73.7 kg (162 lb 6.4 oz)   05/05/16 0400 72.5 kg (159 lb 12.8 oz)   05/03/16 2312 68 kg (150 lb)                    Intake/Output Summary (Last 24 hours) at 05/06/16 1023  Last data filed at 05/05/16 1525   Gross per 24 hour   Intake              120 ml   Output             1300 ml   Net            -1180 ml       Physical Exam:  General:  alert, cooperative, no distress, appears stated age  Neck:  no JVD  Lungs:  clear to auscultation bilaterally  Heart:  regular rate and rhythm  Abdomen:  abdomen is soft without significant tenderness, masses, organomegaly or guarding  Extremities:  extremities normal, atraumatic, no cyanosis or edema  Data Review:     Labs: Results:       Chemistry Recent Labs      05/05/16   0237  05/04/16   0920  05/03/16   2323   GLU  94  84  110*   NA  138  141  139   K  4.3  3.7  3.5   CL  104  109*  108   CO2  23  25  25    BUN  9  9  10    CREA  0.85  0.87  0.97   CA  8.8  8.4*  8.8   AGAP  11  7  6    BUCR  11*  10*  10*   AP  95  90   --    TP  8.2  7.8   --    ALB  3.3*  3.4   --    GLOB  4.9*  4.4*   --    AGRAT  0.7*  0.8   --       CBC w/Diff Recent Labs      05/05/16   0237  05/04/16   0920  05/03/16   2323   WBC  6.3  5.3  6.4   RBC  4.72  4.34*  4.20*   HGB  12.7*  11.8*  11.3*   HCT  39.1  35.3*  33.8*   PLT  252  253  264   GRANS  65   --   36*   LYMPH  25   --   55*   EOS  0   --   1      Cardiac Enzymes No results found for: CPK, CK, CKMMB, CKMB, RCK3, CKMBT, CKNDX, CKND1, MYO, TROPT, TROIQ, TROI, TROPT, TNIPOC, BNP, BNPP   Coagulation Recent Labs      05/05/16   1918  05/05/16   0543  05/05/16   0237   05/04/16   0920   PTP   --    --   12.9   --   12.9    INR   --    --   1.0   --   1.0   APTT  30.9  86.3*  >180.0*   < >   --     < > = values in this interval not displayed.       Lipid Panel Lab Results   Component Value Date/Time    Cholesterol, total 115 05/05/2016 02:37 AM    HDL Cholesterol 47 05/05/2016 02:37 AM    LDL, calculated 56.8 05/05/2016 02:37 AM    VLDL, calculated 11.2 05/05/2016 02:37 AM    Triglyceride 56 05/05/2016 02:37 AM    CHOL/HDL Ratio 2.4 05/05/2016 02:37 AM      BNP No results found for: BNP, BNPP, XBNPT   Liver Enzymes Recent Labs      05/05/16   0237   TP  8.2   ALB  3.3*   AP  95   SGOT  59*      Digoxin    Thyroid Studies Lab Results   Component Value Date/Time    TSH 3.36 05/04/2016 09:20 AM          Signed By: Tiney RougeAmy C Marice Guidone, PA     May 06, 2016

## 2016-05-06 NOTE — Progress Notes (Signed)
[facility] Hospitalist Group  Progress Note    Patient: William Griffin Age: 36 y.o. DOB: Apr 29, 1980 MR#: [id number] SSN: [id number]  Date/Time: 05/06/2016 12:38 PM    Subjective/24-hours:     Continues to complain of some chest pain at times.  Remains hemodynamically stable.    Assessment:   NSTEMI s/p cath - unable to intervene on OM2 disease  CAD  HTN  PAD  Tobacco abuse    Plan:  Imdur added to medication regimen.  Monitor BPS - adjust meds as necessary.  Continue ASA/statin/plavix/lopressor as ordered.  Smoking cessation discussed.  Possible discharge tomorrow if stable from cardiac perspective.    Case discussed with:  Patient  Family  Nursing  Case Management  DVT Prophylaxis:  Lovenox  Hep SQ  SCDs  Coumadin   On Heparin gtt    Objective:   VS:   Visit Vitals   ??? BP (!) 131/91 (BP 1 Location: Left arm, BP Patient Position: Sitting)   ??? Pulse (!) 54   ??? Temp 97 ??F (36.1 ??C)   ??? Resp 20   ??? Ht 5\' 5"  (1.651 m)   ??? Wt 73.7 kg (162 lb 6.4 oz)   ??? SpO2 100%   ??? BMI 27.02 kg/m2      Tmax/24hrs: Temp (24hrs), Avg:98.1 ??F (36.7 ??C), Min:97 ??F (36.1 ??C), Max:99.3 ??F (37.4 ??C)      Intake/Output Summary (Last 24 hours) at 05/06/16 1239  Last data filed at 05/05/16 1525   Gross per 24 hour   Intake                0 ml   Output             1300 ml   Net            -1300 ml       General:  In NAD.  Cardiovascular: RRR.   Pulmonary:  Clear, no wheezes,  Effort nonlabored.  GI:  Abdomen soft, NTTP.  Extremities:  Warm, no ischemia.    Neuro:  Awake and alert, motor grossly nonfocal.    Labs:    Recent Results (from the past 24 hour(s))   HEMOGLOBIN A1C WITH EAG    Collection Time: 05/05/16  4:30 PM   Result Value Ref Range    Hemoglobin A1c 6.4 (H) 4.2 - 5.6 %    Est. average glucose 137 mg/dL   PTT    Collection Time: 05/05/16  7:18 PM   Result Value Ref Range    aPTT 30.9 23.0 - 36.4 SEC   GLUCOSE, POC    Collection Time: 05/05/16 10:09 PM   Result Value Ref Range     Glucose (POC) 106 70 - 110 mg/dL   EKG, 12 LEAD, SUBSEQUENT    Collection Time: 05/06/16  7:11 AM   Result Value Ref Range    Ventricular Rate 53 BPM    Atrial Rate 53 BPM    P-R Interval 150 ms    QRS Duration 94 ms    Q-T Interval 382 ms    QTC Calculation (Bezet) 358 ms    Calculated P Axis 53 degrees    Calculated R Axis 56 degrees    Calculated T Axis 40 degrees    Diagnosis       Sinus bradycardia  Otherwise normal ECG  When compared with ECG of 04-May-2016 11:04,  QT has shortened     GLUCOSE, POC    Collection  Time: 05/06/16  7:57 AM   Result Value Ref Range    Glucose (POC) 94 70 - 110 mg/dL   METABOLIC PANEL, BASIC    Collection Time: 05/06/16 11:00 AM   Result Value Ref Range    Sodium 139 136 - 145 mmol/L    Potassium 4.3 3.5 - 5.5 mmol/L    Chloride 105 100 - 108 mmol/L    CO2 30 21 - 32 mmol/L    Anion gap 4 3.0 - 18 mmol/L    Glucose 117 (H) 74 - 99 mg/dL    BUN 10 7.0 - 18 MG/DL    Creatinine 1.610.88 0.6 - 1.3 MG/DL    BUN/Creatinine ratio 11 (L) 12 - 20      GFR est AA >60 >60 ml/min/1.7473m2    GFR est non-AA >60 >60 ml/min/1.173m2    Calcium 9.3 8.5 - 10.1 MG/DL   CARDIAC PANEL,(CK, CKMB & TROPONIN)    Collection Time: 05/06/16 11:00 AM   Result Value Ref Range    CK 263 39 - 308 U/L    CK - MB 11.8 (H) <3.6 ng/ml    CK-MB Index 4.5 (H) 0.0 - 4.0 %    Troponin-I, Qt. 13.80 (HH) 0.0 - 0.045 NG/ML   GLUCOSE, POC    Collection Time: 05/06/16 11:32 AM   Result Value Ref Range    Glucose (POC) 96 70 - 110 mg/dL       Signed By: Areatha KeasArmando J Trinidi Toppins, MD     May 06, 2016 12:38 PM

## 2016-05-06 NOTE — Other (Signed)
Bedside shift change report given to Kari, RN (oncoming nurse) by April, RN (offgoing nurse). Report included the following information SBAR, Kardex and Recent Results.

## 2016-05-06 NOTE — Progress Notes (Signed)
Care Management Interventions  PCP Verified by CM: Yes [facility] saw NP Richrd Humbles 2 months ago)  Palliative Care Criteria Met (RRAT>21 & CHF Dx)?: No  Mode of Transport at Discharge: BLS  Physical Therapy Consult: No  Occupational Therapy Consult: No  Speech Therapy Consult: No  Current Support Network: Other (Pt reported he lives with friends)  Confirm Follow Up Transport: Friends (Pt reported friends from church can provide transport)  Discharge Location  Discharge Placement: Home     CM contacted physician to discuss [facility] option for pt when discharge.

## 2016-05-07 LAB — GLUCOSE, POC
Glucose (POC): 112 mg/dL — ABNORMAL HIGH (ref 70–110)
Glucose (POC): 91 mg/dL (ref 70–110)

## 2016-05-07 LAB — CARDIAC PANEL,(CK, CKMB & TROPONIN)
CK - MB: 4.1 ng/ml — ABNORMAL HIGH (ref <3.6)
CK-MB Index: 2.8 % (ref 0.0–4.0)
CK: 148 U/L (ref 39–308)
Troponin-I, QT: 13.9 NG/ML — CR (ref 0.0–0.045)

## 2016-05-07 LAB — TROPONIN I: Troponin-I, QT: 15.5 NG/ML — CR (ref 0.0–0.045)

## 2016-05-07 MED ORDER — ACETAMINOPHEN 500 MG TAB
500 mg | Freq: Four times a day (QID) | ORAL | Status: DC | PRN
Start: 2016-05-07 — End: 2016-05-08

## 2016-05-07 MED ORDER — ENOXAPARIN 40 MG/0.4 ML SUB-Q SYRINGE
40 mg/0.4 mL | SUBCUTANEOUS | Status: DC
Start: 2016-05-07 — End: 2016-05-08
  Administered 2016-05-08: 01:00:00 via SUBCUTANEOUS

## 2016-05-07 MED FILL — PANTOPRAZOLE 40 MG TAB, DELAYED RELEASE: 40 mg | ORAL | Qty: 1

## 2016-05-07 MED FILL — HYDROMORPHONE (PF) 1 MG/ML IJ SOLN: 1 mg/mL | INTRAMUSCULAR | Qty: 2

## 2016-05-07 MED FILL — ASPIRIN 81 MG CHEWABLE TAB: 81 mg | ORAL | Qty: 1

## 2016-05-07 MED FILL — METOPROLOL TARTRATE 25 MG TAB: 25 mg | ORAL | Qty: 1

## 2016-05-07 MED FILL — RANEXA 500 MG TABLET,EXTENDED RELEASE: 500 mg | ORAL | Qty: 1

## 2016-05-07 MED FILL — LYRICA 75 MG CAPSULE: 75 mg | ORAL | Qty: 1

## 2016-05-07 MED FILL — AMLODIPINE 5 MG TAB: 5 mg | ORAL | Qty: 1

## 2016-05-07 MED FILL — CLOPIDOGREL 75 MG TAB: 75 mg | ORAL | Qty: 1

## 2016-05-07 MED FILL — INSULIN LISPRO 100 UNIT/ML INJECTION: 100 unit/mL | SUBCUTANEOUS | Qty: 1

## 2016-05-07 MED FILL — ATORVASTATIN 40 MG TAB: 40 mg | ORAL | Qty: 1

## 2016-05-07 MED FILL — ISOSORBIDE MONONITRATE SR 30 MG 24 HR TAB: 30 mg | ORAL | Qty: 1

## 2016-05-07 NOTE — Other (Signed)
Bedside shift change report given to Kari, RN (oncoming nurse) by April, RN (offgoing nurse). Report included the following information SBAR, Kardex and Recent Results.

## 2016-05-07 NOTE — Progress Notes (Signed)
[facility]  Progress Note    Patient: William BangBrent L Griffin Age: 36 y.o. DOB: 1980-04-24 MR#: [id number] SSN: [id number]  Date/Time: 05/07/2016 12:38 PM    Subjective/24-hours:     Trop continues to increase, was 15.5 this am.    He now has left shoulder pain, and chest discomfort, which do not worsen with exertion. Chest pain which brought him to ED has resolved. He feels some shortness of breath when lying flat. Denies shortness of breath at rest or dyspnea on exertion. He quit tobacco several months ago and denies 2nd hand smoke exposure.     Assessment:   1. NSTEMI s/p cath - unable to intervene on OM2 disease - medical management.   2. CAD  3. Hypertension controlled  4. PAD on asa and plavix, he quit tobacco  5. Possible pericarditis - await further input from cards  6. Prediabetes - A1c 6.4. Consult diabetes educator.   7. dvt prophylaxis  8. Full code. [facility]. Will need indigent rx at discharge. I discussed the case with Dr. Dory Peruhung.     [facility] [phone]    Case discussed with:  Patient  Family  Nursing  Case Management  DVT Prophylaxis:  Lovenox  Hep SQ  SCDs  Coumadin   On Heparin gtt    Objective:   VS:   Visit Vitals   ??? BP 128/80 (BP 1 Location: Right arm, BP Patient Position: At rest)   ??? Pulse 64   ??? Temp 98 ??F (36.7 ??C)   ??? Resp 20   ??? Ht 5\' 5"  (1.651 m)   ??? Wt 73 kg (161 lb)   ??? SpO2 96%   ??? BMI 26.79 kg/m2      Tmax/24hrs: Temp (24hrs), Avg:98.2 ??F (36.8 ??C), Min:97.4 ??F (36.3 ??C), Max:98.9 ??F (37.2 ??C)    No intake or output data in the 24 hours ending 05/07/16 1318    General:  In NAD.  Cardiovascular: RRR.   Pulmonary:  Clear, no wheezes,  Effort nonlabored.  GI:  Abdomen soft, NT nd nabs  Extremities:  Warm, no ischemia.    Neuro: no focal deficit. Ambulating in room unassisted and w/o difficulty  Skin: No rash       Labs:    Recent Results (from the past 24 hour(s))   GLUCOSE, POC    Collection Time: 05/06/16  3:58 PM    Result Value Ref Range    Glucose (POC) 128 (H) 70 - 110 mg/dL   GLUCOSE, POC    Collection Time: 05/06/16  9:07 PM   Result Value Ref Range    Glucose (POC) 112 (H) 70 - 110 mg/dL   TROPONIN I    Collection Time: 05/07/16  5:32 AM   Result Value Ref Range    Troponin-I, Qt. 15.50 (HH) 0.0 - 0.045 NG/ML   GLUCOSE, POC    Collection Time: 05/07/16  7:42 AM   Result Value Ref Range    Glucose (POC) 91 70 - 110 mg/dL       Signed By: Betsy PriesEmily E Berdell Hostetler, MD     May 07, 2016 12:38 PM

## 2016-05-07 NOTE — Progress Notes (Signed)
Chaplain conducted a Follow up consultation and Spiritual Assessment for [employer], who is a 36 y.o.,male.      The Chaplain provided the following Interventions:  Continued the relationship of care and support. Patient said he is feeling better. He was smiling and spoke like he was doing well.   Listened empathically.  Offered assurance of continued prayer on patients behalf.   Chart reviewed.    The following outcomes were achieved:  Patient expressed gratitude for pastoral care visit. Spiritual Health Kit provided.   Assessment:  There are no further spiritual or religious issues which require Spiritual Care Services interventions at this time.     Plan:  Chaplains will continue to follow and will provide pastoral care on an as needed/requested basis.  Chaplain recommends patient page chaplain on duty if patient shows signs of acute spiritual or emotional distress.   [place], [place], [facility]  Office [phone]

## 2016-05-07 NOTE — Other (Signed)
Bedside shift change report given to Kari, RN (oncoming nurse) by April M Roser (offgoing nurse). Report included the following information SBAR, Kardex, ED Summary, Intake/Output, MAR, Accordion and Recent Results.

## 2016-05-07 NOTE — Progress Notes (Signed)
[facility] - Progress Note  Admit Date: 05/04/2016    Assessment:     Hospital Problems  Date Reviewed: 2016/04/11          Codes Class Noted POA    Unstable angina (HCC) ICD-10-CM: I20.0  ICD-9-CM: 411.1  05/04/2016 Unknown        NSTEMI (non-ST elevated myocardial infarction) [facility]) ICD-10-CM: I21.4  ICD-9-CM: 410.70  03/05/2016 Unknown              -- Acute NSTEMI. ??Patient's troponin level continues to rise to 15 this AM. Patient with multiple components of chest pain.??He did not have any new EKG changes. Known CAD as noted below. Unfortunately unable to enter OM2 lesion with coronary wire this admission which will be medically managed. Remainder of coronaries patent.  --Single-vessel coronary disease. ??Patient underwent a cardiac catheterization 2 months ago when he presented with an acute MI and was found to have an occluded obtuse marginal branch which was felt to be too small for revascularization. Unfortunately unable to cross this admission. ??Normal left ventricular ejection fraction on echocardiogram in November 2017. ??No significant change or new regional wall motion abnormality seen on limited echocardiogram this admission. ??Patient had been medically treated over the past 2 months with dual antiplatelet therapy and dual antianginal therapy. ??He was never started on a beta-blocker because of bradycardia.  --Essential hypertension. ??This appears to be reasonably well-controlled.  --Prior history of tobacco use. ??Patient quit smoking in July 2017.  --Peripheral vascular disease. ??Diagnosed on noninvasive imaging at [facility] in August 2017.  ??  Plan:     Substernal chest pain much improved from yesterday, still with left shoulder pain which is likely not cardiac. But unfortunately troponin level continues to rise. Will recheck troponin and ECG.  Will continue to maximize cardiac regimen. Continue ASA, plavix, statin. Continue lopressor as HR tolerates, imdur, norvasc, ranexa.   Continue to increase ambulation.    Subjective:     Substernal CP better still with left shoulder pain.    Objective:      Patient Vitals for the past 8 hrs:   Temp Pulse Resp BP SpO2   05/07/16 0833 98.9 ??F (37.2 ??C) 63 20 141/89 95 %   05/07/16 0318 98.2 ??F (36.8 ??C) 73 20 (!) 130/95 96 %         Patient Vitals for the past 96 hrs:   Weight   05/07/16 0458 73 kg (161 lb)   05/06/16 0438 73.7 kg (162 lb 6.4 oz)   05/05/16 0400 72.5 kg (159 lb 12.8 oz)   05/03/16 2312 68 kg (150 lb)                  No intake or output data in the 24 hours ending 05/07/16 1109    Physical Exam:  General:  alert, cooperative, no distress, appears stated age  Neck:  no JVD  Lungs:  clear to auscultation bilaterally  Heart:  regular rate and rhythm, S1, S2 normal, no murmur, click, rub or gallop  Abdomen:  abdomen is soft without significant tenderness, masses, organomegaly or guarding  Extremities:  extremities normal, atraumatic, no cyanosis or edema    Data Review:     Labs: Results:       Chemistry Recent Labs      05/06/16   1100  05/05/16   0237   GLU  117*  94   NA  139  138   K  4.3  4.3  CL  105  104   CO2  30  23   BUN  10  9   CREA  0.88  0.85   CA  9.3  8.8   AGAP  4  11   BUCR  11*  11*   AP   --   95   TP   --   8.2   ALB   --   3.3*   GLOB   --   4.9*   AGRAT   --   0.7*      CBC w/Diff Recent Labs      05/05/16   0237   WBC  6.3   RBC  4.72   HGB  12.7*   HCT  39.1   PLT  252   GRANS  65   LYMPH  25   EOS  0      Cardiac Enzymes Lab Results   Component Value Date/Time    TROIQ 15.50 (HH) 05/07/2016 05:32 AM      Coagulation Recent Labs      05/05/16   1918  05/05/16   0543  05/05/16   0237   PTP   --    --   12.9   INR   --    --   1.0   APTT  30.9  86.3*  >180.0*       Lipid Panel Lab Results   Component Value Date/Time    Cholesterol, total 115 05/05/2016 02:37 AM    HDL Cholesterol 47 05/05/2016 02:37 AM    LDL, calculated 56.8 05/05/2016 02:37 AM    VLDL, calculated 11.2 05/05/2016 02:37 AM     Triglyceride 56 05/05/2016 02:37 AM    CHOL/HDL Ratio 2.4 05/05/2016 02:37 AM      BNP No results found for: BNP, BNPP, XBNPT   Liver Enzymes Recent Labs      05/05/16   0237   TP  8.2   ALB  3.3*   AP  95   SGOT  59*      Digoxin    Thyroid Studies Lab Results   Component Value Date/Time    TSH 3.36 05/04/2016 09:20 AM          Signed By: Tiney RougeAmy C Akari Defelice, PA     May 07, 2016

## 2016-05-07 NOTE — Progress Notes (Signed)
PA Barco notified of elevated troponin.

## 2016-05-07 NOTE — Other (Signed)
Bedside shift change report given to April, RN (oncoming nurse) by Kari, RN (offgoing nurse). Report included the following information SBAR, Kardex and Recent Results.

## 2016-05-07 NOTE — Other (Signed)
Cardiac rehab by to see patient. He was lying in bed. He stated that he was anxious about doing activity because his event happened after exercise. We talked about getting back into his activities slowly and about the outpatient cardiac rehab program. He stated that he would like to participate in that program so he can not feel anxious about activities that he needs to do. He appreciated the time and information that was given. Will continue to follow.

## 2016-05-07 NOTE — Progress Notes (Signed)
Care Management Interventions  PCP Verified by CM: Yes [facility] saw NP Richrd Humbles 2 months ago)  Palliative Care Criteria Met (RRAT>21 & CHF Dx)?: No  Mode of Transport at Discharge: BLS  Physical Therapy Consult: No  Occupational Therapy Consult: No  Speech Therapy Consult: No  Current Support Network: Other (Pt reported he lives with friends)  Confirm Follow Up Transport: Friends (Pt reported friends from church can provide transport)  Discharge Location  Discharge Placement: Home     CM met with pt in room. Pt states his plan to [facility] to the [facility]. Pt provided CM with contact [phone]). Pt's discharge is anticipated for tomorrow.

## 2016-05-08 LAB — CBC W/O DIFF
HCT: 37.2 % (ref 36.0–48.0)
HGB: 12.4 g/dL — ABNORMAL LOW (ref 13.0–16.0)
MCH: 27.3 PG (ref 24.0–34.0)
MCHC: 33.3 g/dL (ref 31.0–37.0)
MCV: 81.8 FL (ref 74.0–97.0)
MPV: 10.8 FL (ref 9.2–11.8)
PLATELET: 280 10*3/uL (ref 135–420)
RBC: 4.55 M/uL — ABNORMAL LOW (ref 4.70–5.50)
RDW: 13.8 % (ref 11.6–14.5)
WBC: 4.6 10*3/uL (ref 4.6–13.2)

## 2016-05-08 LAB — METABOLIC PANEL, BASIC
Anion gap: 6 mmol/L (ref 3.0–18)
BUN/Creatinine ratio: 10 — ABNORMAL LOW (ref 12–20)
BUN: 8 MG/DL (ref 7.0–18)
CO2: 28 mmol/L (ref 21–32)
Calcium: 9.2 MG/DL (ref 8.5–10.1)
Chloride: 103 mmol/L (ref 100–108)
Creatinine: 0.83 MG/DL (ref 0.6–1.3)
GFR est AA: >60 mL/min/{1.73_m2} (ref >60)
GFR est non-AA: >60 mL/min/{1.73_m2} (ref >60)
Glucose: 85 mg/dL (ref 74–99)
Potassium: 4.3 mmol/L (ref 3.5–5.5)
Sodium: 137 mmol/L (ref 136–145)

## 2016-05-08 LAB — GLUCOSE, POC
Glucose (POC): 93 mg/dL (ref 70–110)
Glucose (POC): 84 mg/dL (ref 70–110)

## 2016-05-08 LAB — PHOSPHORUS: Phosphorus: 4.4 MG/DL (ref 2.5–4.9)

## 2016-05-08 LAB — MAGNESIUM: Magnesium: 2 mg/dL (ref 1.6–2.6)

## 2016-05-08 MED ORDER — CYANOCOBALAMIN 500 MCG TAB
500 mcg | ORAL_TABLET | Freq: Every day | ORAL | 0 refills | Status: AC
Start: 2016-05-08 — End: ?

## 2016-05-08 MED ORDER — RANOLAZINE SR 500 MG 12 HR TAB
500 mg | ORAL_TABLET | Freq: Two times a day (BID) | ORAL | 2 refills | Status: AC
Start: 2016-05-08 — End: ?

## 2016-05-08 MED ORDER — ATORVASTATIN 40 MG TAB
40 mg | ORAL_TABLET | Freq: Every evening | ORAL | 2 refills | Status: AC
Start: 2016-05-08 — End: ?

## 2016-05-08 MED ORDER — KETOROLAC TROMETHAMINE 15 MG/ML INJECTION
15 mg/mL | INTRAMUSCULAR | Status: AC
Start: 2016-05-08 — End: 2016-05-08
  Administered 2016-05-08: 20:00:00 via INTRAVENOUS

## 2016-05-08 MED ORDER — CLOPIDOGREL 75 MG TAB
75 mg | ORAL_TABLET | Freq: Every day | ORAL | 2 refills | Status: AC
Start: 2016-05-08 — End: ?

## 2016-05-08 MED ORDER — ASPIRIN 81 MG CHEWABLE TAB
81 mg | ORAL_TABLET | Freq: Every day | ORAL | 2 refills | Status: AC
Start: 2016-05-08 — End: ?

## 2016-05-08 MED ORDER — NITROGLYCERIN 0.4 MG SUBLINGUAL TAB
0.4 mg | SUBLINGUAL | 0 refills | Status: AC | PRN
Start: 2016-05-08 — End: ?

## 2016-05-08 MED ORDER — BUTALBITAL-ACETAMINOPHEN-CAFFEINE 50 MG-300 MG-40 MG CAPSULE
50-300-40 mg | ORAL_CAPSULE | Freq: Four times a day (QID) | ORAL | 0 refills | Status: AC | PRN
Start: 2016-05-08 — End: ?

## 2016-05-08 MED ORDER — TRAMADOL 50 MG TAB
50 mg | ORAL_TABLET | Freq: Four times a day (QID) | ORAL | 0 refills | Status: AC | PRN
Start: 2016-05-08 — End: ?

## 2016-05-08 MED ORDER — METOPROLOL TARTRATE 25 MG TAB
25 mg | ORAL_TABLET | Freq: Two times a day (BID) | ORAL | 2 refills | Status: AC
Start: 2016-05-08 — End: ?

## 2016-05-08 MED ORDER — AMLODIPINE 5 MG TAB
5 mg | ORAL_TABLET | Freq: Every day | ORAL | 2 refills | Status: AC
Start: 2016-05-08 — End: ?

## 2016-05-08 MED ORDER — METOPROLOL TARTRATE 25 MG TAB
25 mg | ORAL_TABLET | Freq: Three times a day (TID) | ORAL | 2 refills | Status: DC
Start: 2016-05-08 — End: 2016-05-08

## 2016-05-08 MED FILL — HYDROMORPHONE (PF) 1 MG/ML IJ SOLN: 1 mg/mL | INTRAMUSCULAR | Qty: 2

## 2016-05-08 MED FILL — LYRICA 75 MG CAPSULE: 75 mg | ORAL | Qty: 1

## 2016-05-08 MED FILL — PANTOPRAZOLE 40 MG TAB, DELAYED RELEASE: 40 mg | ORAL | Qty: 1

## 2016-05-08 MED FILL — DOCUSATE SODIUM 100 MG CAP: 100 mg | ORAL | Qty: 1

## 2016-05-08 MED FILL — METOPROLOL TARTRATE 25 MG TAB: 25 mg | ORAL | Qty: 1

## 2016-05-08 MED FILL — ATORVASTATIN 40 MG TAB: 40 mg | ORAL | Qty: 1

## 2016-05-08 MED FILL — CLOPIDOGREL 75 MG TAB: 75 mg | ORAL | Qty: 1

## 2016-05-08 MED FILL — AMLODIPINE 5 MG TAB: 5 mg | ORAL | Qty: 1

## 2016-05-08 MED FILL — GLUCAGEN DIAGNOSTIC KIT 1 MG/ML INJECTION: 1 mg/mL | INTRAMUSCULAR | Qty: 1

## 2016-05-08 MED FILL — ENOXAPARIN 40 MG/0.4 ML SUB-Q SYRINGE: 40 mg/0.4 mL | SUBCUTANEOUS | Qty: 0.4

## 2016-05-08 MED FILL — ISOSORBIDE MONONITRATE SR 30 MG 24 HR TAB: 30 mg | ORAL | Qty: 1

## 2016-05-08 MED FILL — KETOROLAC TROMETHAMINE 15 MG/ML INJECTION: 15 mg/mL | INTRAMUSCULAR | Qty: 2

## 2016-05-08 MED FILL — ASPIRIN 81 MG CHEWABLE TAB: 81 mg | ORAL | Qty: 1

## 2016-05-08 MED FILL — RANEXA 500 MG TABLET,EXTENDED RELEASE: 500 mg | ORAL | Qty: 1

## 2016-05-08 NOTE — Discharge Summary (Signed)
Discharge Summary    Patient: William Griffin MRN: [id number]  CSN: [id number]    Date of Birth: Oct 21, 1980  Age: 36 y.o.  Sex: male    DOA: 05/04/2016 LOS:  LOS: 4 days   Discharge Date:      Admission Diagnoses: Unstable angina (HCC)  NSTEMI (non-ST elevated myocardial infarction) [facility])    Discharge Diagnoses:    Problem List as of 05/08/2016  Date Reviewed: 03-29-16          Codes Class Noted - Resolved    GERD (gastroesophageal reflux disease) ICD-10-CM: K21.9  ICD-9-CM: 530.81  03/06/2016 - Present        Influenza vaccination declined ICD-10-CM: Z28.21  ICD-9-CM: V64.06  01/08/2016 - Present        Pre-diabetes ICD-10-CM: R73.03  ICD-9-CM: 790.29  01/08/2016 - Present    Overview Signed 01/22/2016  4:27 PM by Manfred Shirts, NP     A1C 6.2             Peripheral vascular disease (HCC) (Chronic) ICD-10-CM: I73.9  ICD-9-CM: 443.9  08/20/2015 - Present        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) ICD-10-CM: N56.213  ICD-9-CM: V49.72  08/20/2015 - Present    Overview Signed 08/20/2015 11:46 AM by Eliane Decree, MD     Secondary to peripheral vascular disease             Neuropathic pain (Chronic) ICD-10-CM: M79.2  ICD-9-CM: 729.2  08/20/2015 - Present        Elevated BP without diagnosis of hypertension ICD-10-CM: R03.0  ICD-9-CM: 796.2  08/20/2015 - Present        Therapeutic drug monitoring ICD-10-CM: Z51.81  ICD-9-CM: V58.83  02/06/2014 - Present        Sciatica ICD-10-CM: M54.30  ICD-9-CM: 724.3  04/04/2013 - Present        RESOLVED: Unstable angina (HCC) ICD-10-CM: I20.0  ICD-9-CM: 411.1  05/04/2016 - 05/08/2016        RESOLVED: NSTEMI (non-ST elevated myocardial infarction) [facility]) ICD-10-CM: I21.4  ICD-9-CM: 410.70  03/05/2016 - 05/08/2016            Reason for Admission  37 y/o AAM presented to ED with acute onset left-sided chest pain which radiated into his left shoulder and left upper arm. Pain was severe and started at rest, pain was somewhat reminiscent of his recent MI from 2  months ago. At that time he had more severe substernal chest pain which radiated down his entire left arm and into his hand with tingling and numbness.  This episode was not quite as severe. He admitted to decreased compliance with medications over past 1 - 2 weeks. initial troponin was unremarkable, his EKG did not show any acute ST or T-wave abnormalities. He was recommended for admission.     Discharge Condition: Good    PHYSICAL EXAM at discharge.   Visit Vitals   ??? BP 127/77 (BP 1 Location: Left arm, BP Patient Position: Sitting)   ??? Pulse 84   ??? Temp 97.8 ??F (36.6 ??C)   ??? Resp 16   ??? Ht 5\' 5"  (1.651 m)   ??? Wt 71.8 kg (158 lb 3.2 oz)   ??? SpO2 97%   ??? BMI 26.33 kg/m2     General:  In NAD.  Cardiovascular: RRR.   Chest wall: no ttp  Pulmonary:  Clear, no wheezes,  Effort nonlabored.  GI:  Abdomen soft, NT nd nabs  Extremities:  Warm, no ischemia.    Neuro: no focal deficit. Ambulating in room unassisted and w/o difficulty  Skin: No rash     Hospital Course:   1. NSTEMI s/p cath - unable to intervene on OM2 disease - medical management recommended. Asa, plavix, statin. Started on ranexa. He quit tobacco several months ago.   2. Single-vessel coronary disease. Patient underwent a cardiac catheterization 2 months ago when he presented with an acute MI and was found to have an occluded obtuse marginal branch which was felt to be too small for revascularization.  3. Hypertension controlled. Maintained on norvasc for anti anginal properties.   4. PAD diagnosed on noninvasive imaging at [facility] in August 2017. on asa and plavix, he quit tobacco August 2017.   5. Left shoulder pain improving- trial of Toradol given this am without improvement - low suspicion for pericarditis.   6. Prediabetes - A1c 6.4. Nutritionist worked with him, consistent carbohydrate diet handouts provided 05/05/16 .   7. dvt prophylaxis was given in the form of heparin gtt, then lovenox subcut daily.    8. Full code. indigent rx provided at discharge. Return to [facility] with [facility] for AMI. Patient agrees; all questions answered to the best of my ability.   ??  [employer] [phone]    Consults: Cardiology Dr. Dory Peru    Procedures  Left heart catheterization, coronary angiography, left ventriculography. OM 2 could not be entered with a coronary wire. Synetta Shadow, MD. 05/05/2016    Significant Diagnostic Studies: labs:   Recent Results (from the past 24 hour(s))   GLUCOSE, POC    Collection Time: 05/07/16  9:44 PM   Result Value Ref Range    Glucose (POC) 93 70 - 110 mg/dL   METABOLIC PANEL, BASIC    Collection Time: 05/08/16  3:14 AM   Result Value Ref Range    Sodium 137 136 - 145 mmol/L    Potassium 4.3 3.5 - 5.5 mmol/L    Chloride 103 100 - 108 mmol/L    CO2 28 21 - 32 mmol/L    Anion gap 6 3.0 - 18 mmol/L    Glucose 85 74 - 99 mg/dL    BUN 8 7.0 - 18 MG/DL    Creatinine 9.81 0.6 - 1.3 MG/DL    BUN/Creatinine ratio 10 (L) 12 - 20      GFR est AA >60 >60 ml/min/1.32m2    GFR est non-AA >60 >60 ml/min/1.39m2    Calcium 9.2 8.5 - 10.1 MG/DL   MAGNESIUM    Collection Time: 05/08/16  3:14 AM   Result Value Ref Range    Magnesium 2.0 1.6 - 2.6 mg/dL   PHOSPHORUS    Collection Time: 05/08/16  3:14 AM   Result Value Ref Range    Phosphorus 4.4 2.5 - 4.9 MG/DL   CBC W/O DIFF    Collection Time: 05/08/16 12:00 PM   Result Value Ref Range    WBC 4.6 4.6 - 13.2 K/uL    RBC 4.55 (L) 4.70 - 5.50 M/uL    HGB 12.4 (L) 13.0 - 16.0 g/dL    HCT 19.1 47.8 - 29.5 %    MCV 81.8 74.0 - 97.0 FL    MCH 27.3 24.0 - 34.0 PG    MCHC 33.3 31.0 - 37.0 g/dL    RDW 62.1 30.8 - 65.7 %    PLATELET 280 135 - 420 K/uL    MPV 10.8 9.2 - 11.8 FL   GLUCOSE,  POC    Collection Time: 05/08/16 12:18 PM   Result Value Ref Range    Glucose (POC) 84 70 - 110 mg/dL     IMAGING  XR Results (most recent):    Results from Hospital Encounter encounter on 05/04/16   XR CHEST PA LAT   Narrative PA and lateral CXR:    Comparison March 05, 2016     HISTORY: Left-sided chest pain    Cardiac silhouette and vascularity within normal limits. Left lung and  costophrenic angle are clear. No pneumothorax. Mild density in the right medial  lung base with preserved right heart border. No right pleural effusion. No  consolidation.         Impression IMPRESSION: Mild infiltrate/atelectasis in the right medial lung base, probably  from the lower lobe.        EKG Results     Procedure 720 Value Units Date/Time    SCANNED CARDIAC RHYTHM STRIP [[id number]] Collected:  05/08/16 0822    Order Status:  Completed Updated:  05/08/16 1042    EKG, 12 LEAD, SUBSEQUENT [[id number]] Collected:  05/06/16 0711    Order Status:  Completed Updated:  05/06/16 1437     Ventricular Rate 53 BPM      Atrial Rate 53 BPM      P-R Interval 150 ms      QRS Duration 94 ms      Q-T Interval 382 ms      QTC Calculation (Bezet) 358 ms      Calculated P Axis 53 degrees      Calculated R Axis 56 degrees      Calculated T Axis 40 degrees      Diagnosis --     Sinus bradycardia  Otherwise normal ECG  When compared with ECG of 04-May-2016 11:04,  QT has shortened  Confirmed by Doristine Johns, MD, Ryan [phone]) on 05/06/2016 2:37:31 PM      EKG, 12 LEAD, INITIAL [[id number]] Collected:  05/04/16 1104    Order Status:  Completed Updated:  05/04/16 1413     Ventricular Rate 59 BPM      Atrial Rate 59 BPM      P-R Interval 146 ms      QRS Duration 88 ms      Q-T Interval 416 ms      QTC Calculation (Bezet) 411 ms      Calculated P Axis 52 degrees      Calculated R Axis 42 degrees      Calculated T Axis 25 degrees      Diagnosis --     Sinus bradycardia  Otherwise normal ECG  When compared with ECG of 04-May-2016 03:14,  No significant change was found  Confirmed by Zoila Shutter, MD, Marc [phone]) on 05/04/2016 2:13:44 PM      EKG, 12 LEAD, INITIAL [[id number]] Collected:  05/04/16 0314    Order Status:  Completed Updated:  05/04/16 1405     Ventricular Rate 59 BPM      Atrial Rate 59 BPM      P-R Interval 148 ms       QRS Duration 96 ms      Q-T Interval 398 ms      QTC Calculation (Bezet) 394 ms      Calculated P Axis 53 degrees      Calculated R Axis 52 degrees      Calculated T Axis 39 degrees      Diagnosis --     Sinus bradycardia  Otherwise normal ECG  When compared with ECG of 03-May-2016 22:51,  No significant change was found  Confirmed by Zoila Shutterosenberg, MD, Vernia BuffMarc [phone]([id number]) on 05/04/2016 2:05:42 PM      EKG, 12 LEAD, INITIAL [[id number]] Collected:  05/03/16 2251    Order Status:  Completed Updated:  05/04/16 1403     Ventricular Rate 68 BPM      Atrial Rate 68 BPM      P-R Interval 150 ms      QRS Duration 92 ms      Q-T Interval 374 ms      QTC Calculation (Bezet) 397 ms      Calculated P Axis 64 degrees      Calculated R Axis 64 degrees      Calculated T Axis 59 degrees      Diagnosis --     Normal sinus rhythm  Normal ECG  When compared with ECG of 05-Mar-2016 17:56,  No significant change was found  Confirmed by Zoila Shutterosenberg, MD, Vernia BuffMarc [phone]([id number]) on 05/04/2016 2:03:31 PM          Limited Transthoracic Echocardiogram 04-May-2016  Left ventricle: Systolic function was normal by visual assessment. Ejection fraction was estimated to be 60 %. No obvious wall motion abnormalities identified in the views obtained. COMPARISONS: There has been no significant change. Comparison was made with the previous study of 05-Mar-2016.    Discharge Medications:     Current Discharge Medication List      START taking these medications    Details   ranolazine ER (RANEXA) 500 mg SR tablet Take 1 Tab by mouth two (2) times a day.  Qty: 60 Tab, Refills: 2                CONTINUE these medications which have CHANGED    Details   metoprolol tartrate (LOPRESSOR) 25 mg tablet Take 1 Tab by mouth every eight (8) hours.  Qty: 60 Tab, Refills: 2      pregabalin (LYRICA) 100 mg capsule Take 1 Cap by mouth two (2) times a day. Max Daily Amount: 200 mg. Indications: neuropathic pain  Qty: 60 Cap, Refills: 1    Comments: Order placed on Behalf of Dr. Spero GeraldsNancy Morewitz   Associated Diagnoses: Neuropathic pain         CONTINUE these medications which have NOT CHANGED    Details   traMADol (ULTRAM) 50 mg tablet Take 1 Tab by mouth every six (6) hours as needed for Pain. Max Daily Amount: 200 mg.  Qty: 12 Tab, Refills: 0      amLODIPine (NORVASC) 5 mg tablet Take 1 Tab by mouth daily.  Qty: 30 Tab, Refills: 2      atorvastatin (LIPITOR) 40 mg tablet Take 1 Tab by mouth nightly.  Qty: 30 Tab, Refills: 2      clopidogrel (PLAVIX) 75 mg tab Take 1 Tab by mouth daily.  Qty: 30 Tab, Refills: 2      aspirin 81 mg chewable tablet Take 1 Tab by mouth daily.  Qty: 30 Tab, Refills: 0      pantoprazole (PROTONIX) 40 mg tablet Take 1 Tab by mouth Daily (before breakfast).  Qty: 30 Tab, Refills: 0      nitroglycerin (NITROSTAT) 0.4 mg SL tablet 1 Tab by SubLINGual route every five (5) minutes as needed for Chest Pain.  Qty: 20 Tab, Refills: 0      butalbital-acetaminophen-caff (FIORICET) 50-300-40 mg per capsule Take 1 Cap by mouth every six (6) hours  as needed for Headache. Max Daily Amount: 4 Caps.  Qty: 12 Cap, Refills: 0      cyanocobalamin (VITAMIN B12) 500 mcg tablet Take 1 Tab by mouth daily.  Qty: 30 Tab, Refills: 11         STOP taking these medications       isosorbide dinitrate (ISORDIL) 10 mg tablet Comments:   Reason for Stopping:         gabapentin (NEURONTIN) 300 mg capsule Comments:   Reason for Stopping:               Activity: Activity as tolerated    Diet: Cardiac Diet, consistent carb.    Wound Care: as directed.     Follow-up:   1. NP Sp[place]ding 7 - 10 days  2. Dr. Dory Peru 4 weeks.    Minutes spent on discharge: greater than 30

## 2016-05-08 NOTE — Other (Signed)
A brief follow up completed with patient. He stated that he was doing ok today. He stated that he did not have any questions at the present time. Will continue to follow.

## 2016-05-08 NOTE — Other (Signed)
Bedside shift change report given to Derwood KaplanShavon, RN (oncoming nurse) by April M Roser (offgoing nurse). Report included the following information SBAR, Kardex, OR Summary, Intake/Output, MAR, Accordion and Recent Results.

## 2016-05-08 NOTE — Other (Signed)
GLYCEMIC CONTROL PLAN OF CARE    Assessment:  Pt is 36 yr old male admitted on 05/04/16 for treatment and evaluation of chest pain. Pt with past medical history significant for NSTEMI, PVD, Neuropathic pain, sciatica, HTN, PreDM.    Recommendations:  Diabetic education.     Consider placing pt on accuchecks ACHS to familiarize himself with checking his BG. Pt reports he received BG meter from the foundation at his last visit and plans to f/u with them post d/c.    Will continue to monitor inpatient for intervention.    Most recent blood glucose values:  Lab Results   Component Value Date/Time    GLU 85 05/08/2016 03:14 AM    GLUCPOC 84 05/08/2016 12:18 PM    GLUCPOC 93 05/07/2016 09:44 PM       Current A1C:  6.4$ estimated average blood glucose of 137 mg/dl over the past 2-3 months    Current hospital diabetes medications:  None    Diet:   Cardiac     Home diabetes medications:  None pt reports he was taking metformin but was told after his MI to stop    Goals: BG will be within target range by _1/28/18____    Education:  _x__  Refer to Diabetes Education Record             ___  Education not indicated at this time    Francis GainesAlison Helm, MS, RD, CDE  Pager: [phone]

## 2016-05-08 NOTE — Progress Notes (Signed)
[facility] - Progress Note  Admit Date: 05/04/2016    Assessment:     Hospital Problems  Date Reviewed: 03/20/2016    None          -- Acute NSTEMI. ??Patient's troponin peaked 15. Patient with multiple components of chest pain.??He did not have any new EKG changes. Known CAD as noted below. Unfortunately unable to enter OM2 lesion with coronary wire this admission which will be medically managed. Remainder of coronaries patent.  --Single-vessel coronary disease. ??Patient underwent a cardiac catheterization 2 months ago when he presented with an acute MI and was found to have an occluded obtuse marginal branch which was felt to be too small for revascularization. Unfortunately unable to cross this admission. ??Normal left ventricular ejection fraction on echocardiogram in November 2017. ??No significant change or new regional wall motion abnormality seen on limited echocardiogram this admission. ??Patient had been medically treated over the past 2 months with dual antiplatelet therapy and dual antianginal therapy. ??He was never started on a beta-blocker because of bradycardia.  --Essential hypertension. ??This appears to be reasonably well-controlled.  --Prior history of tobacco use. ??Patient quit smoking in July 2017.  --Peripheral vascular disease. ??Diagn[facility] in August 2017.  ??  Plan:     Patients troponin is no trending downward.  Patient unfortunately continues to have CP and now has a third component of left sided CP, worse when lying flat. In addition to left shoulder pain and substernal chest discomfort. All three components better with ambulation.  Will give trial of toradol now and if feeling better would treat for presumed pericarditis with NSAID therapy.    Subjective:     Chest pain as noted above    Objective:      Patient Vitals for the past 8 hrs:   Temp Pulse Resp BP SpO2   05/08/16 1103 99 ??F (37.2 ??C) (!) 58 16 121/81 96 %    05/08/16 0826 97.5 ??F (36.4 ??C) 64 20 (!) 133/98 97 %   05/08/16 0409 98.6 ??F (37 ??C) 68 20 143/89 97 %         Patient Vitals for the past 96 hrs:   Weight   05/08/16 0409 71.8 kg (158 lb 3.2 oz)   05/07/16 0458 73 kg (161 lb)   05/06/16 0438 73.7 kg (162 lb 6.4 oz)   05/05/16 0400 72.5 kg (159 lb 12.8 oz)                    Intake/Output Summary (Last 24 hours) at 05/08/16 1149  Last data filed at 05/08/16 1026   Gross per 24 hour   Intake              240 ml   Output              240 ml   Net                0 ml       Physical Exam:  General:  alert, cooperative, no distress, appears stated age  Neck:  no JVD  Lungs:  clear to auscultation bilaterally  Heart:  regular rate and rhythm  Abdomen:  abdomen is soft without significant tenderness, masses, organomegaly or guarding  Extremities:  extremities normal, atraumatic, no cyanosis or edema    Data Review:     Labs: Results:       Chemistry Recent Labs      05/08/16  1610  05/06/16   1100   GLU  85  117*   NA  137  139   K  4.3  4.3   CL  103  105   CO2  28  30   BUN  8  10   CREA  0.83  0.88   CA  9.2  9.3   MG  2.0   --    PHOS  4.4   --    AGAP  6  4   BUCR  10*  11*      CBC w/Diff No results for input(s): WBC, RBC, HGB, HCT, PLT, GRANS, LYMPH, EOS, HGBEXT, HCTEXT, PLTEXT in the last 72 hours.   Cardiac Enzymes Lab Results   Component Value Date/Time    CPK 148 05/07/2016 12:45 PM    CKMB 4.1 (H) 05/07/2016 12:45 PM    CKND1 2.8 05/07/2016 12:45 PM    TROIQ 13.90 (HH) 05/07/2016 12:45 PM      Coagulation Recent Labs      05/05/16   1918   APTT  30.9       Lipid Panel Lab Results   Component Value Date/Time    Cholesterol, total 115 05/05/2016 02:37 AM    HDL Cholesterol 47 05/05/2016 02:37 AM    LDL, calculated 56.8 05/05/2016 02:37 AM    VLDL, calculated 11.2 05/05/2016 02:37 AM    Triglyceride 56 05/05/2016 02:37 AM    CHOL/HDL Ratio 2.4 05/05/2016 02:37 AM      BNP No results found for: BNP, BNPP, XBNPT    Liver Enzymes No results for input(s): TP, ALB, TBIL, AP, SGOT, GPT in the last 72 hours.    No lab exists for component: DBIL   Digoxin    Thyroid Studies Lab Results   Component Value Date/Time    TSH 3.36 05/04/2016 09:20 AM          Signed By: Tiney Rouge, PA     May 08, 2016

## 2016-05-08 NOTE — Progress Notes (Addendum)
Care Management Interventions  PCP Verified by CM: Yes [facility] saw NP Richrd Humbles 2 months ago)  Palliative Care Criteria Met (RRAT>21 & CHF Dx)?: No  Mode of Transport at Discharge: BLS  Physical Therapy Consult: No  Occupational Therapy Consult: No  Speech Therapy Consult: No  Current Support Network: Other (Pt reported he lives with friends)  Confirm Follow Up Transport: Family Geologist, engineering)  Discharge Location  Discharge Placement: Home     CM contacted ministry where pt currently resides. The ministry Lucianne Lei will be available to transport pt. Doristine Bosworth ask that either pt or staff call when pt is ready for pick up.   Will assist pt with indigent meds when scripts become available. Pt has been informed that refills and OTCs will be his responsibility. Pt voiced understanding.     Pt's prescriptions submitted to outpatient pharmacy.    CM discussed H2H option with pt. He shares that he just got a new job and is scheduled to begin working on Monday, Jan 29th. Pt is open to [facility] visit. Referral submitted to [facility].

## 2016-05-08 NOTE — Other (Signed)
Diabetes Patient/Family Education Record    Factors That  May Influence Patients Ability  to Learn or  Comply with Recommendations      Language barrier       Cultural needs      Motivation       Cognitive limitation       Physical      Education       Physiological factors      Hearing/vision/speaking impairment      Religious beliefs       Financial factors     Other:     No factors identified at this time.     Person Instructed:      Patient      Family     Other     Preference for Learning:      Verbal      Written     Demonstration     Level of Comprehension & Competence:      Good                                       Fair                                       Poor                               Needs Reinforcement     Teachback completed    Education Component:     Medication management,     Nutritional management      Exercise     Signs, symptoms, and treatment of hyperglycemia and hypoglycemia    Prevention, recognition and treatment of hyperglycemia and hypoglycemia     Importance of blood glucose monitoring and how to obtain a blood glucose meter      Instruction on use of the blood glucose meter     Discuss the importance of HbA1C monitoring      Sick day guidelines     Proper use and disposal of lancets, needles, syringes or insulin pens (if appropriate)     Potential long-term complications (retinopathy, kidney disease, neuropathy, foot care)    Information about whom to contact in case of emergency or for more information      Goal:  Patient/family will demonstrate understanding of Diabetes Self Management Skills by: (date) _2/1/18______  Plan for post-discharge education or self-management support:     Outpatient class schedule provided             Patient Declined     Scheduled for outpatient classes (date) _______     Francis GainesAlison Helm, MS, RD, CDE  Pager: [phone]

## 2016-05-08 NOTE — Home Health (Signed)
[facility] - noted patient discharged yesterday evening. This nurse was finally able to get the patient's demographic information verified and referral completed and sent to central intake for visit scheduling but it is noted on the referral the nurse must call prior to the visit to make sure the patient will be available due to his work schedule - S. Mallori Araque LPN

## 2016-05-08 NOTE — Progress Notes (Signed)
D/C patient, removed IV not c/o pain at this time. Gave discharge instructions and indigent medications

## 2016-05-08 NOTE — Discharge Summary (Signed)
[facility]      05/08/2016      RE: Bretta BangBrent L Gallon      To Whom it May Concern:      This is to certify that Bretta BangBrent L Schaumburg may return to work 05/09/16.      Please feel free to contact my office if you have any questions or concerns.  Thank you for your assistance in this matter.    Sincerely,      Betsy PriesEmily E Consetta Cosner, MD

## 2016-05-08 NOTE — Discharge Summary (Signed)
Discharge Summary by Betsy PriesSchley, Makarios Madlock E, MD at 05/08/16 [phone]                Author: Betsy PriesSchley, Denell Cothern E, MD  Service: Internal Medicine  Author Type: Physician       Filed: 05/08/16 0927  Date of Service: 05/08/16 0926  Status: Signed          Editor: Betsy PriesSchley, Estel Scholze E, MD (Physician)                     [facility]           05/08/2016         RE: Robbie LisBrent L Cotta         To Whom it May Concern:        This is to certify that Bretta BangBrent L Marcucci may return to work 05/09/16.         Please feel free to contact my office if you have any questions or concerns.  Thank you for your assistance in this matter.      Sincerely,         Betsy PriesEmily E Bailea Beed, MD

## 2016-05-08 NOTE — Discharge Summary (Signed)
Discharge Summary    Patient: William Griffin MRN: [id number]  CSN: [id number]    Date of Birth: 11/08/1980  Age: 36 y.o.  Sex: male    DOA: 05/04/2016 LOS:  LOS: 4 days   Discharge Date:      Admission Diagnoses: Unstable angina (HCC)  NSTEMI (non-ST elevated myocardial infarction) [facility])    Discharge Diagnoses:    Problem List as of 05/08/2016  Date Reviewed: 04-06-2016          Codes Class Noted - Resolved    GERD (gastroesophageal reflux disease) ICD-10-CM: K21.9  ICD-9-CM: 530.81  03/06/2016 - Present        Influenza vaccination declined ICD-10-CM: Z28.21  ICD-9-CM: V64.06  01/08/2016 - Present        Pre-diabetes ICD-10-CM: R73.03  ICD-9-CM: 790.29  01/08/2016 - Present    Overview Signed 01/22/2016  4:27 PM by Manfred Shirts, NP     A1C 6.2             Peripheral vascular disease (HCC) (Chronic) ICD-10-CM: I73.9  ICD-9-CM: 443.9  08/20/2015 - Present        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) ICD-10-CM: N56.213  ICD-9-CM: V49.72  08/20/2015 - Present    Overview Signed 08/20/2015 11:46 AM by Eliane Decree, MD     Secondary to peripheral vascular disease             Neuropathic pain (Chronic) ICD-10-CM: M79.2  ICD-9-CM: 729.2  08/20/2015 - Present        Elevated BP without diagnosis of hypertension ICD-10-CM: R03.0  ICD-9-CM: 796.2  08/20/2015 - Present        Therapeutic drug monitoring ICD-10-CM: Z51.81  ICD-9-CM: V58.83  02/06/2014 - Present        Sciatica ICD-10-CM: M54.30  ICD-9-CM: 724.3  04/04/2013 - Present        RESOLVED: Unstable angina (HCC) ICD-10-CM: I20.0  ICD-9-CM: 411.1  05/04/2016 - 05/08/2016        RESOLVED: NSTEMI (non-ST elevated myocardial infarction) [facility]) ICD-10-CM: I21.4  ICD-9-CM: 410.70  03/05/2016 - 05/08/2016            Reason for Admission  36 y/o AAM presented to ED with acute onset left-sided chest pain which radiated into his left shoulder and left upper arm. Pain was severe and started at rest, pain was somewhat reminiscent of his recent MI from 2  months ago. At that time he had more severe substernal chest pain which radiated down his entire left arm and into his hand with tingling and numbness.  This episode was not quite as severe. He admitted to decreased compliance with medications over past 1 - 2 weeks. initial troponin was unremarkable, his EKG did not show any acute ST or T-wave abnormalities. He was recommended for admission.     Discharge Condition: Good    PHYSICAL EXAM at discharge.   Visit Vitals   ??? BP 127/77 (BP 1 Location: Left arm, BP Patient Position: Sitting)   ??? Pulse 84   ??? Temp 97.8 ??F (36.6 ??C)   ??? Resp 16   ??? Ht 5\' 5"  (1.651 m)   ??? Wt 71.8 kg (158 lb 3.2 oz)   ??? SpO2 97%   ??? BMI 26.33 kg/m2     General:  In NAD.  Cardiovascular: RRR.   Chest wall: no ttp  Pulmonary:  Clear, no wheezes,  Effort nonlabored.  GI:  Abdomen soft, NT nd nabs  Extremities:  Warm, no ischemia.    Neuro: no focal deficit. Ambulating in room unassisted and w/o difficulty  Skin: No rash     Hospital Course:   1. NSTEMI s/p cath - unable to intervene on OM2 disease - medical management recommended. Asa, plavix, statin. Started on ranexa. He quit tobacco several months ago.   2. Single-vessel coronary disease. Patient underwent a cardiac catheterization 2 months ago when he presented with an acute MI and was found to have an occluded obtuse marginal branch which was felt to be too small for revascularization.  3. Hypertension controlled. Maintained on norvasc for anti anginal properties.   4. PAD diagnosed on noninvasive imaging at [facility] in August 2017. on asa and plavix, he quit tobacco August 2017.   5. Left shoulder pain improving- trial of Toradol given this am without improvement - low suspicion for pericarditis.   6. Prediabetes - A1c 6.4. Nutritionist worked with him, consistent carbohydrate diet handouts provided 05/05/16 .   7. dvt prophylaxis was given in the form of heparin gtt, then lovenox subcut daily.   8. Full code. indigent rx provided at  discharge. [facility]I. Patient agrees; all questions answered to the best of my ability.   ??  [employer] [phone]    Consults: Cardiology Dr. Dory Peru    Procedures  Left heart catheterization, coronary angiography, left ventriculography. OM 2 could not be entered with a coronary wire. Synetta Shadow, MD. 05/05/2016    Significant Diagnostic Studies: labs:   Recent Results (from the past 24 hour(s))   GLUCOSE, POC    Collection Time: 05/07/16  9:44 PM   Result Value Ref Range    Glucose (POC) 93 70 - 110 mg/dL   METABOLIC PANEL, BASIC    Collection Time: 05/08/16  3:14 AM   Result Value Ref Range    Sodium 137 136 - 145 mmol/L    Potassium 4.3 3.5 - 5.5 mmol/L    Chloride 103 100 - 108 mmol/L    CO2 28 21 - 32 mmol/L    Anion gap 6 3.0 - 18 mmol/L    Glucose 85 74 - 99 mg/dL    BUN 8 7.0 - 18 MG/DL    Creatinine 4.54 0.6 - 1.3 MG/DL    BUN/Creatinine ratio 10 (L) 12 - 20      GFR est AA >60 >60 ml/min/1.26m2    GFR est non-AA >60 >60 ml/min/1.46m2    Calcium 9.2 8.5 - 10.1 MG/DL   MAGNESIUM    Collection Time: 05/08/16  3:14 AM   Result Value Ref Range    Magnesium 2.0 1.6 - 2.6 mg/dL   PHOSPHORUS    Collection Time: 05/08/16  3:14 AM   Result Value Ref Range    Phosphorus 4.4 2.5 - 4.9 MG/DL   CBC W/O DIFF    Collection Time: 05/08/16 12:00 PM   Result Value Ref Range    WBC 4.6 4.6 - 13.2 K/uL    RBC 4.55 (L) 4.70 - 5.50 M/uL    HGB 12.4 (L) 13.0 - 16.0 g/dL    HCT 09.8 11.9 - 14.7 %    MCV 81.8 74.0 - 97.0 FL    MCH 27.3 24.0 - 34.0 PG    MCHC 33.3 31.0 - 37.0 g/dL    RDW 82.9 56.2 - 13.0 %    PLATELET 280 135 - 420 K/uL    MPV 10.8 9.2 - 11.8 FL   GLUCOSE,  POC    Collection Time: 05/08/16 12:18 PM   Result Value Ref Range    Glucose (POC) 84 70 - 110 mg/dL     IMAGING  XR Results (most recent):    Results from Hospital Encounter encounter on 05/04/16   XR CHEST PA LAT   Narrative PA and lateral CXR:    Comparison March 05, 2016    HISTORY: Left-sided chest pain    Cardiac silhouette  and vascularity within normal limits. Left lung and  costophrenic angle are clear. No pneumothorax. Mild density in the right medial  lung base with preserved right heart border. No right pleural effusion. No  consolidation.         Impression IMPRESSION: Mild infiltrate/atelectasis in the right medial lung base, probably  from the lower lobe.        EKG Results     Procedure 720 Value Units Date/Time    SCANNED CARDIAC RHYTHM STRIP [[id number]] Collected:  05/08/16 0822    Order Status:  Completed Updated:  05/08/16 1042    EKG, 12 LEAD, SUBSEQUENT [[id number]] Collected:  05/06/16 0711    Order Status:  Completed Updated:  05/06/16 1437     Ventricular Rate 53 BPM      Atrial Rate 53 BPM      P-R Interval 150 ms      QRS Duration 94 ms      Q-T Interval 382 ms      QTC Calculation (Bezet) 358 ms      Calculated P Axis 53 degrees      Calculated R Axis 56 degrees      Calculated T Axis 40 degrees      Diagnosis --     Sinus bradycardia  Otherwise normal ECG  When compared with ECG of 04-May-2016 11:04,  QT has shortened  Confirmed by Doristine JohnsSeutter, MD, Ryan [phone]) on 05/06/2016 2:37:31 PM      EKG, 12 LEAD, INITIAL [[id number]][[id number]] Collected:  05/04/16 1104    Order Status:  Completed Updated:  05/04/16 1413     Ventricular Rate 59 BPM      Atrial Rate 59 BPM      P-R Interval 146 ms      QRS Duration 88 ms      Q-T Interval 416 ms      QTC Calculation (Bezet) 411 ms      Calculated P Axis 52 degrees      Calculated R Axis 42 degrees      Calculated T Axis 25 degrees      Diagnosis --     Sinus bradycardia  Otherwise normal ECG  When compared with ECG of 04-May-2016 03:14,  No significant change was found  Confirmed by Zoila Shutterosenberg, MD, Marc [phone]) on 05/04/2016 2:13:44 PM      EKG, 12 LEAD, INITIAL [[id number]] Collected:  05/04/16 0314    Order Status:  Completed Updated:  05/04/16 1405     Ventricular Rate 59 BPM      Atrial Rate 59 BPM      P-R Interval 148 ms      QRS Duration 96 ms      Q-T Interval 398 ms      QTC Calculation  (Bezet) 394 ms      Calculated P Axis 53 degrees      Calculated R Axis 52 degrees      Calculated T Axis 39 degrees      Diagnosis --     Sinus bradycardia  Otherwise normal ECG  When compared with ECG of 03-May-2016 22:51,  No significant change was found  Confirmed by Zoila Shutter, MD, Vernia Buff [phone]) on 05/04/2016 2:05:42 PM      EKG, 12 LEAD, INITIAL [[id number]] Collected:  05/03/16 2251    Order Status:  Completed Updated:  05/04/16 1403     Ventricular Rate 68 BPM      Atrial Rate 68 BPM      P-R Interval 150 ms      QRS Duration 92 ms      Q-T Interval 374 ms      QTC Calculation (Bezet) 397 ms      Calculated P Axis 64 degrees      Calculated R Axis 64 degrees      Calculated T Axis 59 degrees      Diagnosis --     Normal sinus rhythm  Normal ECG  When compared with ECG of 05-Mar-2016 17:56,  No significant change was found  Confirmed by Zoila Shutter, MD, Vernia Buff [phone]) on 05/04/2016 2:03:31 PM          Limited Transthoracic Echocardiogram 04-May-2016  Left ventricle: Systolic function was normal by visual assessment. Ejection fraction was estimated to be 60 %. No obvious wall motion abnormalities identified in the views obtained. COMPARISONS: There has been no significant change. Comparison was made with the previous study of 05-Mar-2016.    Discharge Medications:     Current Discharge Medication List      START taking these medications    Details   ranolazine ER (RANEXA) 500 mg SR tablet Take 1 Tab by mouth two (2) times a day.  Qty: 60 Tab, Refills: 2                CONTINUE these medications which have CHANGED    Details   metoprolol tartrate (LOPRESSOR) 25 mg tablet Take 1 Tab by mouth every eight (8) hours.  Qty: 60 Tab, Refills: 2      pregabalin (LYRICA) 100 mg capsule Take 1 Cap by mouth two (2) times a day. Max Daily Amount: 200 mg. Indications: neuropathic pain  Qty: 60 Cap, Refills: 1    Comments: Order placed on Behalf of Dr. Spero Geralds  Associated Diagnoses: Neuropathic pain         CONTINUE these  medications which have NOT CHANGED    Details   traMADol (ULTRAM) 50 mg tablet Take 1 Tab by mouth every six (6) hours as needed for Pain. Max Daily Amount: 200 mg.  Qty: 12 Tab, Refills: 0      amLODIPine (NORVASC) 5 mg tablet Take 1 Tab by mouth daily.  Qty: 30 Tab, Refills: 2      atorvastatin (LIPITOR) 40 mg tablet Take 1 Tab by mouth nightly.  Qty: 30 Tab, Refills: 2      clopidogrel (PLAVIX) 75 mg tab Take 1 Tab by mouth daily.  Qty: 30 Tab, Refills: 2      aspirin 81 mg chewable tablet Take 1 Tab by mouth daily.  Qty: 30 Tab, Refills: 0      pantoprazole (PROTONIX) 40 mg tablet Take 1 Tab by mouth Daily (before breakfast).  Qty: 30 Tab, Refills: 0      nitroglycerin (NITROSTAT) 0.4 mg SL tablet 1 Tab by SubLINGual route every five (5) minutes as needed for Chest Pain.  Qty: 20 Tab, Refills: 0      butalbital-acetaminophen-caff (FIORICET) 50-300-40 mg per capsule Take 1 Cap by mouth every six (6) hours  as needed for Headache. Max Daily Amount: 4 Caps.  Qty: 12 Cap, Refills: 0      cyanocobalamin (VITAMIN B12) 500 mcg tablet Take 1 Tab by mouth daily.  Qty: 30 Tab, Refills: 11         STOP taking these medications       isosorbide dinitrate (ISORDIL) 10 mg tablet Comments:   Reason for Stopping:         gabapentin (NEURONTIN) 300 mg capsule Comments:   Reason for Stopping:               Activity: Activity as tolerated    Diet: Cardiac Diet, consistent carb.    Wound Care: as directed.     Follow-up:   1. NP [place] 7 - 10 days  2. Dr. Dory Peru 4 weeks.    Minutes spent on discharge: greater than 30

## 2016-05-09 LAB — C REACTIVE PROTEIN, QT: C-Reactive protein: 1.6 mg/dL — ABNORMAL HIGH (ref 0–0.3)

## 2016-05-09 NOTE — Telephone Encounter (Signed)
Left voicemail message

## 2016-05-12 NOTE — Telephone Encounter (Signed)
Cardiac Rehab attempted to call patient but was unable to reach him or leave a message. Additional attempts at contact will be made.     Thank you,  Monica BectonAshley Pett

## 2016-05-15 ENCOUNTER — Encounter: Primary: Family

## 2016-05-16 NOTE — Telephone Encounter (Signed)
Cardiac Rehab called patient and left a message with a man. Additional attempts to contact will be made.     Thank you,  Monica BectonAshley Pett

## 2016-05-21 ENCOUNTER — Encounter: Attending: Cardiovascular Disease | Primary: Family

## 2016-06-11 NOTE — Progress Notes (Signed)
Chart review for upcoming office visit.

## 2016-06-13 ENCOUNTER — Encounter: Attending: Sleep Medicine | Primary: Family

## 2016-07-08 ENCOUNTER — Inpatient Hospital Stay
Admit: 2016-07-08 | Discharge: 2016-07-08 | Disposition: A | Discharge status: Home / Self Care | Payer: Self-pay | Attending: Emergency Medicine

## 2016-07-08 DIAGNOSIS — M792 Neuralgia and neuritis, unspecified: Secondary | ICD-10-CM

## 2016-07-08 MED ORDER — PREDNISONE 10 MG TABLETS IN A DOSE PACK
10 mg | ORAL_TABLET | ORAL | 0 refills | Status: AC
Start: 2016-07-08 — End: ?

## 2016-07-08 MED ORDER — NAPROXEN 500 MG TAB
500 mg | ORAL_TABLET | Freq: Two times a day (BID) | ORAL | 0 refills | Status: AC
Start: 2016-07-08 — End: ?

## 2016-07-08 NOTE — ED Notes (Signed)
Bedside shift change report given to Marcelino DusterMichelle, RN (oncoming nurse) by Synetta FailKameko, RN (offgoing nurse). Report included the following information SBAR and ED Summary.

## 2016-07-08 NOTE — ED Notes (Signed)
I have reviewed discharge instructions with the patient.  The patient is very upset , he states he will not accept the prescription for prednisone or naprosyn as they do not work for him. Provider notified and no new orders or scripts placed. pateint refuses to sign discharge paperwork. Patient armband removed and shredded.  Pt is ambulatory with no acute distress noted at this time, the patient is alert, oriented and stable at time of discharge. Vital Signs stable.

## 2016-07-08 NOTE — ED Triage Notes (Signed)
Pt c/o chronic nerve pain in leg. Pt states the pain started last summer and pt [facility] Pt states his medicaid does not kick in until May and the pain flared up yesterday.

## 2016-07-08 NOTE — ED Provider Notes (Signed)
EMERGENCY DEPARTMENT HISTORY AND PHYSICAL EXAM    8:57 AM      Date: 07/08/2016  Patient Name: William Griffin    History of Presenting Illness     Chief Complaint   Patient presents with   ??? Leg Pain         History Provided By: Patient    Additional History (Context): Bretta BangBrent L Manon is a 36 y.o. male with asthma, sciatica, and neuropathy who presents with to the ED c/o nerve pain of right leg onset yesterday. Pt states this is a chronic problem that has affected him intermittently. [facility] but stated it has not bothered him for about two months. Current episode of nerve pain began two days ago and worsened yesterday after only laying in bed. Pt states his pain is exacerbated by bearing weight and putting pressure on the right leg. Pain is described as a "shooting pain" that radiates down the lateral aspect of the right leg.    PCP: Manfred ShirtsKristel D Spalding, NP    Chief Complaint: Nerve pain  Duration:  Days (x2)  Timing:  Worsening  Location: Right leg  Quality: "Shooting"  Severity: N/A  Modifying Factors: Exacerbated by bearing weight/putting pressure on right leg  Associated Symptoms: denies any other associated signs or symptoms      Current Outpatient Prescriptions   Medication Sig Dispense Refill   ??? predniSONE (STERAPRED DS) 10 mg dose pack As per pack 48 Tab 0   ??? naproxen (NAPROSYN) 500 mg tablet Take 1 Tab by mouth two (2) times daily (with meals). 7 Tab 0   ??? ranolazine ER (RANEXA) 500 mg SR tablet Take 1 Tab by mouth two (2) times a day. 60 Tab 2   ??? amLODIPine (NORVASC) 5 mg tablet Take 1 Tab by mouth daily. 30 Tab 2   ??? aspirin 81 mg chewable tablet Take 1 Tab by mouth daily. 30 Tab 2   ??? atorvastatin (LIPITOR) 40 mg tablet Take 1 Tab by mouth nightly. 30 Tab 2   ??? butalbital-acetaminophen-caff (FIORICET) 50-300-40 mg per capsule Take 1 Cap by mouth every six (6) hours as needed for Headache. 12 Cap 0    ??? clopidogrel (PLAVIX) 75 mg tab Take 1 Tab by mouth daily. 30 Tab 2   ??? cyanocobalamin (VITAMIN B12) 500 mcg tablet Take 1 Tab by mouth daily. 30 Tab 0   ??? nitroglycerin (NITROSTAT) 0.4 mg SL tablet 1 Tab by SubLINGual route every five (5) minutes as needed for Chest Pain. 1 Bottle 0   ??? traMADol (ULTRAM) 50 mg tablet Take 1 Tab by mouth every six (6) hours as needed for Pain. Max Daily Amount: 200 mg. 12 Tab 0   ??? metoprolol tartrate (LOPRESSOR) 25 mg tablet Take 1 Tab by mouth two (2) times a day. 60 Tab 2   ??? pregabalin (LYRICA) 100 mg capsule Take 1 Cap by mouth two (2) times a day. Max Daily Amount: 200 mg. Indications: neuropathic pain 60 Cap 1   ??? isosorbide mononitrate ER (IMDUR) 30 mg tablet Take 1 Tab by mouth daily. 30 Tab 0   ??? pantoprazole (PROTONIX) 40 mg tablet Take 1 Tab by mouth Daily (before breakfast). 30 Tab 0       Past History     Past Medical History:  Past Medical History:   Diagnosis Date   ??? Asthma    ??? Chronic pain    ??? 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 Neuropathy    ??? NSTEMI (non-ST elevated myocardial infarction) (HCC) 02/2016   ??? Pre-diabetes 01/08/2016    A1C 6.2   ??? Sciatica    ??? Vitamin D deficiency        Past Surgical History:  Past Surgical History:   Procedure Laterality Date   ??? CARDIAC CATHETERIZATION  05/05/2016        ??? CORONARY ARTERY ANGIOGRAM  05/05/2016        ??? 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 HX HEART CATHETERIZATION  03/05/2016    LAD 10%; LCx mid 30%; OM2 distal 100%) medical management   ??? HX ORTHOPAEDIC     ??? MODERATE SEDATION  05/05/2016        ??? PTCA SINGLE VESSEL  05/05/2016            Family History:  Family History   Problem Relation Age of Onset   ??? Hypertension Mother    ??? Stroke Mother    ??? Hypertension Father    ??? Asthma Sister        Social History:  Social History   Substance Use Topics   ??? Smoking status: Former Smoker     Packs/day: 0.50     Types: Cigarettes   ??? Smokeless tobacco: Never Used       Comment: quit about 5 months ago   ??? Alcohol use No      Comment: occasional "pt stated that he stop drinking"       Allergies:  No Known Allergies      Review of Systems     Review of Systems   Constitutional: Negative.    Neurological:        Positive for "nerve pain" of right leg   All other systems reviewed and are negative.        Physical Exam   There were no vitals taken for this visit.    Physical Exam   Constitutional: He is oriented to person, place, and time. He appears well-developed.   HENT:   Head: Normocephalic and atraumatic.   Eyes: EOM are normal. Pupils are equal, round, and reactive to light.   Neck: Normal range of motion. Neck supple.   Cardiovascular: Normal rate, regular rhythm and normal heart sounds.  Exam reveals no friction rub.    No murmur heard.  Pulmonary/Chest: Effort normal and breath sounds normal. No respiratory distress. He has no wheezes.   Abdominal: Soft. He exhibits no distension. There is no tenderness. There is no rebound and no guarding.   Musculoskeletal: Normal range of motion.   No sig bonytenderness to hip, thight knee ankle ro foot.     Pain radiates down right leg to thigh.   nvi distally; no trauama   Neurological: He is alert and oriented to person, place, and time.   Skin: Skin is warm and dry.   Psychiatric: He has a normal mood and affect. His behavior is normal. Thought content normal.         Diagnostic Study Results         Medical Decision Making     1. Neuropathic pain; will tx with pred/naprosyn. Hx of similar x 10; ongonig problem.      Diagnosis     1. Neuropathic pain    2. Sciatica of right side        _______________________________  Attestations:  Scribe Attestation     Duayne Cal acting as a Neurosurgeon for and in the presence of Algis Downs, MD      July 08, 2016 at 8:57 AM       Provider Attestation:      I personally performed the services described in the documentation,  reviewed the documentation, as recorded by the scribe in my presence, and it accurately and completely records my words and actions. July 08, 2016 at 8:57 AM - Algis Downs, MD    _______________________________

## 2016-09-01 NOTE — Telephone Encounter (Signed)
Cannot reach patient by phone.

## 2019-05-03 ENCOUNTER — Ambulatory Visit: Payer: No Typology Code available for payment source | Admitting: Physician Assistant

## 2019-05-05 ENCOUNTER — Encounter: Payer: Medicare HMO | Attending: Physician Assistant | Admitting: Physician Assistant

## 2019-05-05 ENCOUNTER — Other Ambulatory Visit: Payer: Self-pay

## 2019-05-05 DIAGNOSIS — I10 Essential (primary) hypertension: Secondary | ICD-10-CM | POA: Insufficient documentation

## 2019-05-05 DIAGNOSIS — Z87891 Personal history of nicotine dependence: Secondary | ICD-10-CM | POA: Diagnosis not present

## 2019-05-05 DIAGNOSIS — L98492 Non-pressure chronic ulcer of skin of other sites with fat layer exposed: Secondary | ICD-10-CM | POA: Diagnosis not present

## 2019-05-05 DIAGNOSIS — Z96641 Presence of right artificial hip joint: Secondary | ICD-10-CM | POA: Insufficient documentation

## 2019-05-05 DIAGNOSIS — E669 Obesity, unspecified: Secondary | ICD-10-CM | POA: Insufficient documentation

## 2019-05-05 DIAGNOSIS — Z7901 Long term (current) use of anticoagulants: Secondary | ICD-10-CM | POA: Diagnosis not present

## 2019-05-05 DIAGNOSIS — E11622 Type 2 diabetes mellitus with other skin ulcer: Secondary | ICD-10-CM | POA: Diagnosis present

## 2019-05-05 DIAGNOSIS — E1142 Type 2 diabetes mellitus with diabetic polyneuropathy: Secondary | ICD-10-CM | POA: Diagnosis not present

## 2019-05-05 DIAGNOSIS — Z6832 Body mass index (BMI) 32.0-32.9, adult: Secondary | ICD-10-CM | POA: Insufficient documentation

## 2019-05-05 DIAGNOSIS — Z823 Family history of stroke: Secondary | ICD-10-CM | POA: Diagnosis not present

## 2019-05-05 DIAGNOSIS — Z8249 Family history of ischemic heart disease and other diseases of the circulatory system: Secondary | ICD-10-CM | POA: Insufficient documentation

## 2019-05-05 NOTE — Progress Notes (Addendum)
David Davies, Ikeem ([id number]) Visit Report for 05/05/2019 Chief Complaint Document Details Patient Name: David Davies, Elmer Date of Service: 05/05/2019 9:45 AM Medical Record Number: [id number] Patient Account Number: [id number] Date of Birth/Sex: [birth year] (39 y.o. M) Treating RN: Rodell PernaScott, Dajea Primary Care Provider: PATIENT, NO Other Clinician: Referring Provider: Referral, Self Treating Provider/Extender: Linwood DibblesSTONE III, Darrell Hauk Weeks in Treatment: 0 Information Obtained from: Patient Chief Complaint Right LE surgical ulcers Electronic Signature(s) Signed: 05/06/2019 9:08:30 AM By: Lenda KelpStone III, Mylen Mangan PA-C Entered By: Lenda KelpStone III, Shakeem Stern on 05/06/2019 09:08:29 David Davies, Amilcar ([id number]) -------------------------------------------------------------------------------- HPI Details Patient Name: David CarpenLYBURN, Knox Date of Service: 05/05/2019 9:45 AM Medical Record Number: [id number] Patient Account Number: [id number] Date of Birth/Sex: [birth year] (38 y.o. M) Treating RN: Rodell PernaScott, Dajea Primary Care Provider: PATIENT, NO Other Clinician: Referring Provider: Referral, Self Treating Provider/Extender: STONE III, Sheneka Schrom Weeks in Treatment: 0 History of Present Illness HPI Description: 05/05/2019 patient presents today to our clinic with a fairly extensive history which I was able to retrieve in epic. Unfortunately it looks as though beginning on February 15, 2019 the patient did have an initial visit with a podiatrist secondary to what was diagnosed as a fungal foot infection. This was at [facility] he saw Reita ClicheAbraham McCoy. I actually do not have access to that note incompletion that I can see. Nonetheless the patient subsequently 2 days later ended up going to the hospital through the emergency department where he was admitted at [facility] due to a right foot infection. Looking at the discharge summary he was admitted on 02/17/2019 and was not discharged until 03/05/2019. He has history upon admission was that he had been  seen 2 days prior to urgent care and had skin breakdown between his toes. He was given Augmentin and terbinafine at that time. He was instructed to try Epson salt baths but noted extreme worsening in the pain and went to the ED for further evaluation. He had no injury noted. 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 He is on Xarelto for prophylaxis due to thrombosis that he is previously had. 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 He has no history of diabetes. He also states that he quit smoking in 2017. During the hospital admission he denied any use of cocaine or marijuana though there was some question about this as it was reported in the chart. He also denied alcohol use. There was however some initial question as to whether or not his arterial flow was affected on the right. Subsequently he ended up being diagnosed with acute critical limb ischemia in the right lower extremity and had a distal embolectomy on 02/17/2019. This thrombus was in the right anterior tibial artery the patient subsequently ended up having a compartment syndrome and fasciotomy on 02/19/2019. They did end up repeating a CT angiogram with runoff to ensure everything was okay due to his severe pain fortunately everything was stable. 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 With that being said he tells me that he is out of his medication right now. It does not look like he has been going to his appointments as directed [place] [place]. Subsequently the patient was reevaluated in December on the ninth of 2020. At this point he was again having issues with no evidence of DVT but that was the concern due to his extensive pain  in the and they suggested that he follow-up with pain management and this was initiated at that point. Since that time  he really has not followed up with anybody from an orthopedic standpoint following the fasciotomy. Upon inspection today the patient does have a history of what appears to be diabetes with a hemoglobin A1c checked in November 2020 to be 8.0, hypertension, peripheral neuropathy, and peripheral vascular disease which seems to be recurrent due to thrombi. Unfortunately right now he tells me he is not taking any of his medicines as he has not been able to get a primary care provider but it looks as if he is also missed several appointments he was supposed to have with multiple providers including hematology. Electronic Signature(s) Signed: 05/13/2019 8:32:30 AM By: Lenda Kelp PA-C Previous Signature: 05/06/2019 9:10:07 AM Version By: Lenda Kelp PA-C Previous Signature: 05/06/2019 8:37:25 AM Version By: Lenda Kelp PA-C Entered By: Lenda Kelp on 05/13/2019 08:32:29 David Carpen ([id number]) -------------------------------------------------------------------------------- Gaynelle Adu TISS Details Patient Name: David Carpen Date of Service: 05/05/2019 9:45 AM Medical Record Number: [id number] Patient Account Number: [id number] Date of Birth/Sex: 1980-04-21 (38 y.o. M) Treating RN: Rodell Perna Primary Care Provider: PATIENT, NO Other Clinician: Referring Provider: Referral, Self Treating Provider/Extender: STONE III, Vane Yapp Weeks in Treatment: 0 Procedure Performed for: Wound #1 Right,Medial Lower Leg Performed By: Physician STONE III, Damein Gaunce E., PA-C Post Procedure Diagnosis Same as Pre-procedure Notes 2 sticks of silver nitrate Electronic Signature(s) Signed: 05/05/2019 4:17:07 PM By: Rodell Perna Entered By: Rodell Perna on 05/05/2019 11:37:50 David Carpen ([id number]) -------------------------------------------------------------------------------- Gaynelle Adu TISS Details Patient Name: David Carpen Date of Service: 05/05/2019 9:45 AM Medical Record  Number: [id number] Patient Account Number: [id number] Date of Birth/Sex: 03/11/1981 (38 y.o. M) Treating RN: Rodell Perna Primary Care Provider: PATIENT, NO Other Clinician: Referring Provider: Referral, Self Treating Provider/Extender: STONE III, Lakina Mcintire Weeks in Treatment: 0 Procedure Performed for: Wound #2 Left,Lateral Lower Leg Performed By: Physician STONE III, Faviola Klare E., PA-C Post Procedure Diagnosis Same as Pre-procedure Notes 2 sticks of silver nitrate Electronic Signature(s) Signed: 05/05/2019 4:17:07 PM By: Rodell Perna Entered By: Rodell Perna on 05/05/2019 11:38:05 David Carpen ([id number]) -------------------------------------------------------------------------------- Physical Exam Details Patient Name: David Carpen Date of Service: 05/05/2019 9:45 AM Medical Record Number: [id number] Patient Account Number: [id number] Date of Birth/Sex: 12/18/1980 (38 y.o. M) Treating RN: Rodell Perna Primary Care Provider: PATIENT, NO Other Clinician: Referring Provider: Referral, Self Treating Provider/Extender: STONE III, Shelbylynn Walczyk Weeks in Treatment: 0 Constitutional sitting or standing blood pressure is within target range for patient.. pulse regular and within target range for patient.Marland Kitchen respirations regular, non-labored and within target range for patient.Marland Kitchen temperature within target range for patient.. Well- nourished and well-hydrated in no acute distress. Eyes conjunctiva clear no eyelid edema noted. pupils equal round and reactive to light and accommodation. Ears, Nose, Mouth, and Throat no gross abnormality of ear auricles or external auditory canals. normal hearing noted during conversation. mucus membranes moist. Respiratory normal breathing without difficulty. clear to auscultation bilaterally. Cardiovascular regular rate and rhythm with normal S1, S2. 1+ dorsalis pedis/posterior tibialis pulses on the right. no clubbing, cyanosis, significant edema, <3 sec cap  refill. Gastrointestinal (GI) soft, non-tender, non-distended, +BS. no ventral hernia noted. Musculoskeletal normal gait and posture. Left LE BKA. Psychiatric this patient is able to make decisions and demonstrates good insight into disease process. Alert and Oriented x 3. pleasant and cooperative. Notes Upon inspection today patient's wound bed actually showed signs of good granulation at  this time. Fortunately the only issue I see is that he may have some hyper granulation which I think we need to address. I think potentially using chemical cauterization followed by utilization of Hydrofera Blue. The patient does have a follow-up with vascular as well as hematology/oncology. I think that he needs to see them ASAP. In the meantime if he is not able to get his medicines from them I would recommend that he go to urgent care to get refills. Electronic Signature(s) Signed: 05/06/2019 9:12:37 AM By: Lenda Kelp PA-C Entered By: Lenda Kelp on 05/06/2019 09:12:37 KAVONTAE, PRITCHARD ([id number]) -------------------------------------------------------------------------------- Physician Orders Details Patient Name: David Carpen Date of Service: 05/05/2019 9:45 AM Medical Record Number: [id number] Patient Account Number: [id number] Date of Birth/Sex: 10-18-1980 (38 y.o. M) Treating RN: Rodell Perna Primary Care Provider: PATIENT, NO Other Clinician: Referring Provider: Referral, Self Treating Provider/Extender: STONE III, Everhett Bozard Weeks in Treatment: 0 Verbal / Phone Orders: No Diagnosis Coding Wound Cleansing Wound #1 Right,Medial Lower Leg o Clean wound with Normal Saline. - in office o Cleanse wound with mild soap and water Wound #2 Left,Lateral Lower Leg o Clean wound with Normal Saline. - in office o Cleanse wound with mild soap and water Primary Wound Dressing Wound #1 Right,Medial Lower Leg o Hydrafera Blue Ready Transfer Wound #2 Left,Lateral Lower Leg o Hydrafera  Blue Ready Transfer Secondary Dressing Wound #1 Right,Medial Lower Leg o ABD and Kerlix/Conform Wound #2 Left,Lateral Lower Leg o ABD and Kerlix/Conform Dressing Change Frequency Wound #1 Right,Medial Lower Leg o Change Dressing Monday, Wednesday, Friday Wound #2 Left,Lateral Lower Leg o Change Dressing Monday, Wednesday, Friday Follow-up Appointments Wound #1 Right,Medial Lower Leg o Return Appointment in 1 week. Wound #2 Left,Lateral Lower Leg o Return Appointment in 1 week. Home Health Wound #1 Right,Medial Lower Leg o Continue Home Health Visits - [facility] o Home Health Nurse may visit PRN to address patientos wound care needs. o FACE TO FACE ENCOUNTER: MEDICARE and MEDICAID PATIENTS: I certify that this patient is under my care and that I had a face-to-face encounter that meets the physician face-to-face encounter requirements with this HURSHELL, DINO ([id number]) patient on this date. The encounter with the patient was in whole or in part for the following MEDICAL CONDITION: (primary reason for Home Healthcare) MEDICAL NECESSITY: I certify, that based on my findings, NURSING services are a medically necessary home health service. HOME BOUND STATUS: I certify that my clinical findings support that this patient is homebound (i.e., Due to illness or injury, pt requires aid of supportive devices such as crutches, cane, wheelchairs, walkers, the use of special transportation or the assistance of another person to leave their place of residence. There is a normal inability to leave the home and doing so requires considerable and taxing effort. Other absences are for medical reasons / religious services and are infrequent or of short duration when for other reasons). o If current dressing causes regression in wound condition, may D/C ordered dressing product/s and apply Normal Saline Moist Dressing daily until next Wound Healing Center / Other MD appointment.  Notify Wound Healing Center of regression in wound condition at [phone]. o Please direct any NON-WOUND related issues/requests for orders to patient's Primary Care Physician Wound #2 Left,Lateral Lower Leg o Continue Home Health Visits - [facility] o Home Health Nurse may visit PRN to address patientos wound care needs. o FACE TO FACE ENCOUNTER: MEDICARE and MEDICAID PATIENTS: I certify that this patient is under my care  and that I had a face-to-face encounter that meets the physician face-to-face encounter requirements with this patient on this date. The encounter with the patient was in whole or in part for the following MEDICAL CONDITION: (primary reason for [place]) MEDICAL NECESSITY: I certify, that based on my findings, NURSING services are a medically necessary home health service. HOME BOUND STATUS: I certify that my clinical findings support that this patient is homebound (i.e., Due to illness or injury, pt requires aid of supportive devices such as crutches, cane, wheelchairs, walkers, the use of special transportation or the assistance of another person to leave their place of residence. There is a normal inability to leave the home and doing so requires considerable and taxing effort. Other absences are for medical reasons / religious services and are infrequent or of short duration when for other reasons). o If current dressing causes regression in wound condition, may D/C ordered dressing product/s and apply Normal Saline Moist Dressing daily until next [facility] / Other MD appointment. [place] of regression in wound condition at [phone]. o Please direct any NON-WOUND related issues/requests for orders to patient's Primary Care Physician Electronic Signature(s) Signed: 05/05/2019 4:17:07 PM By: Army Melia Signed: 05/05/2019 5:39:02 PM By: Worthy Keeler PA-C Entered By: Army Melia on 05/05/2019  11:36:58 [name] ([id number]) -------------------------------------------------------------------------------- Problem List Details Patient Name: [name] Date of Service: 05/05/2019 9:45 AM Medical Record Number: [id number] Patient Account Number: [id number] Date of Birth/Sex: Aug 13, 1980 (39 y.o. 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 05/06/2019 No Yes exposed I73.89 Other specified peripheral vascular diseases 05/06/2019 No Yes E11.622 Type 2 diabetes mellitus with other skin ulcer 05/06/2019 No Yes Z79.01 Long term (current) use of anticoagulants 05/06/2019 No Yes I10 Essential (primary) hypertension 05/06/2019 No Yes Inactive Problems Resolved Problems Electronic Signature(s) Signed: 05/06/2019 9:08:07 AM By: Worthy Keeler PA-C Entered By: Worthy Keeler on 05/06/2019 09:08:07 [name] ([id number]) -------------------------------------------------------------------------------- Progress Note Details Patient Name: [name] Date of Service: 05/05/2019 9:45 AM Medical Record Number: [id number] Patient Account Number: [id number] Date of Birth/Sex: Jul 30, 1980 (38 y.o. M) Treating RN: Army Melia Primary Care Provider: PATIENT, NO Other Clinician: Referring Provider: Referral, Self Treating Provider/Extender: STONE III, Alize Acy Weeks in Treatment: 0 Subjective Chief Complaint Information obtained from Patient Right LE surgical ulcers History of Present Illness (HPI) 05/05/2019 patient presents today to our clinic with a fairly extensive  history which I was able to retrieve in epic. Unfortunately it looks as though beginning on February 15, 2019 the patient did have an initial visit with a podiatrist secondary to what was diagnosed as a fungal foot infection. This was at [facility] he saw Quincy Carnes. I actually do not have access to that note incompletion that I can see. Nonetheless the patient subsequently 2 days later ended up going to the hospital through the emergency department where he was admitted at [facility] due to a right foot infection. Looking at the discharge summary he was admitted on 02/17/2019 and was not discharged until 03/05/2019. He has history upon admission was that he had been seen 2 days prior to urgent care and had skin breakdown between  his toes. He was given Augmentin and terbinafine at that time. He was instructed to try Epson salt baths but noted extreme worsening in the pain and went to the ED for further evaluation. He had no injury noted. 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 He is on Xarelto for prophylaxis due to thrombosis that he is previously had. 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 He has no history of diabetes. He also states that he quit smoking in 2017. During the hospital admission he denied any use of cocaine or marijuana though there was some question about this as it was reported in the chart. He also denied alcohol use. There was however some initial question as to whether or not his arterial flow was affected on the right. Subsequently he ended up being diagnosed with acute critical limb ischemia in the right lower extremity and had a distal embolectomy on 02/17/2019. This thrombus was in the right anterior tibial artery the patient subsequently ended up having a compartment syndrome and fasciotomy on 02/19/2019. They did end up repeating a CT angiogram with runoff to ensure  everything was okay due to his severe pain fortunately everything was stable. 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 With that being said he tells me that he is out of his medication right now. It does not look like he has been going to his appointments as directed he has recently moved to [facility]. Subsequently the patient was reevaluated in December on the ninth of 2020. At this point he was again having issues with no evidence of DVT but that was the concern due to his extensive pain in the and they suggested that he follow-up with pain management and this was initiated at that point. Since that time he really has not followed up with anybody from an orthopedic standpoint following the fasciotomy. Upon inspection today the patient does have a history of what appears to be diabetes with a hemoglobin A1c checked in November 2020 to be 8.0, hypertension, peripheral neuropathy, and peripheral vascular disease which seems to be recurrent due to thrombi. Unfortunately right now he tells me he is not taking any of his medicines as he has not been able to get a primary care provider but it looks as if he is also missed several appointments he was supposed to have with multiple providers including hematology. Patient History Information obtained from Patient. Allergies No Known Drug Allergies Family History Hypertension - Mother,Father, Stroke - Mother, No family history of Cancer, Diabetes, Heart Disease, Kidney Disease, Lung Disease, Seizures, Thyroid Problems, Tuberculosis. RAMAR, NOBREGA ([id number]) Social History Former smoker, Marital Status - Married, Alcohol Use - Never, Drug Use - No History, Caffeine Use - Moderate. Medical History Cardiovascular Patient has history of Hypertension Neurologic Patient has history of Neuropathy - feet Hospitalization/Surgery History - November [facility]. Review of  Systems (ROS) Eyes Denies complaints or symptoms of Dry Eyes, Vision Changes, Glasses / Contacts. Ear/Nose/Mouth/Throat Denies complaints or symptoms of Difficult clearing ears, Sinusitis. Hematologic/Lymphatic Denies complaints or symptoms of Bleeding / Clotting Disorders, Human Immunodeficiency Virus. Respiratory Denies complaints or symptoms of Chronic or frequent coughs, Shortness of Breath. Cardiovascular Denies complaints or symptoms of Chest pain, LE edema. Gastrointestinal Denies complaints or symptoms of Frequent diarrhea, Nausea, Vomiting. Endocrine Denies complaints or symptoms of Hepatitis, Thyroid disease, Polydypsia (  Excessive Thirst). Genitourinary Denies complaints or symptoms of Kidney failure/ Dialysis, Incontinence/dribbling. Immunological Denies complaints or symptoms of Hives, Itching. Integumentary (Skin) Complains or has symptoms of Wounds - Fasciotomy. Musculoskeletal Denies complaints or symptoms of Muscle Pain, Muscle Weakness. Objective Constitutional sitting or standing blood pressure is within target range for patient.. pulse regular and within target range for patient.Marland Kitchen respirations regular, non-labored and within target range for patient.Marland Kitchen temperature within target range for patient.. Well- nourished and well-hydrated in no acute distress. Vitals Time Taken: 10:20 AM, Height: 66 in, Source: Stated, Weight: 199 lbs, Source: Measured, BMI: 32.1, Temperature: 98.8 F, Pulse: 80 bpm, Respiratory Rate: 16 breaths/min, Blood Pressure: 132/96 mmHg. Eyes conjunctiva clear no eyelid edema noted. pupils equal round and reactive to light and accommodation. Ears, Nose, Mouth, and Throat no gross abnormality of ear auricles or external auditory canals. normal hearing noted during conversation. mucus Spinella, Navraj ([id number]) membranes moist. Respiratory normal breathing without difficulty. clear to auscultation bilaterally. Cardiovascular regular rate and  rhythm with normal S1, S2. 1+ dorsalis pedis/posterior tibialis pulses on the right. no clubbing, cyanosis, significant edema, Gastrointestinal (GI) soft, non-tender, non-distended, +BS. no ventral hernia noted. Musculoskeletal normal gait and posture. Left LE BKA. Psychiatric this patient is able to make decisions and demonstrates good insight into disease process. Alert and Oriented x 3. pleasant and cooperative. General Notes: Upon inspection today patient's wound bed actually showed signs of good granulation at this time. Fortunately the only issue I see is that he may have some hyper granulation which I think we need to address. I think potentially using chemical cauterization followed by utilization of Hydrofera Blue. The patient does have a follow-up with vascular as well as hematology/oncology. I think that he needs to see them ASAP. In the meantime if he is not able to get his medicines from them I would recommend that he go to urgent care to get refills. Integumentary (Hair, Skin) Wound #1 status is Open. Original cause of wound was Surgical Injury. The wound is located on the Right,Medial Lower Leg. The wound measures 16cm length x 5cm width x 0.1cm depth; 62.832cm^2 area and 6.283cm^3 volume. There is Fat Layer (Subcutaneous Tissue) Exposed exposed. There is no tunneling or undermining noted. There is a medium amount of serosanguineous drainage noted. The wound margin is flat and intact. There is large (67-100%) red, hyper - granulation within the wound bed. There is no necrotic tissue within the wound bed. Wound #2 status is Open. Original cause of wound was Surgical Injury. The wound is located on the Left,Lateral Lower Leg. The wound measures 13cm length x 3.5cm width x 0.1cm depth; 35.736cm^2 area and 3.574cm^3 volume. There is Fat Layer (Subcutaneous Tissue) Exposed exposed. There is no tunneling or undermining noted. There is a medium amount of serosanguineous drainage noted.  The wound margin is flat and intact. There is large (67-100%) red, hyper - granulation within the wound bed. There is no necrotic tissue within the wound bed. Assessment Active Problems ICD-10 Disruption of external operation (surgical) wound, not elsewhere classified, initial encounter Non-pressure chronic ulcer of skin of other sites with fat layer exposed Other specified peripheral vascular diseases Type 2 diabetes mellitus with other skin ulcer Long term (current) use of anticoagulants Essential (primary) hypertension Strubel, Dima ([id number]) Procedures Wound #1 Pre-procedure diagnosis of Wound #1 is a Dehisced Wound located on the Right,Medial Lower Leg . An CHEM CAUT GRANULATION TISS procedure was performed by STONE III, Taahir Grisby E., PA-C. Post procedure Diagnosis Wound #1: Same  as Pre-Procedure Notes: 2 sticks of silver nitrate Wound #2 Pre-procedure diagnosis of Wound #2 is a Dehisced Wound located on the Left,Lateral Lower Leg . An CHEM CAUT GRANULATION TISS procedure was performed by STONE III, Janaia Kozel E., PA-C. Post procedure Diagnosis Wound #2: Same as Pre-Procedure Notes: 2 sticks of silver nitrate Plan Wound Cleansing: Wound #1 Right,Medial Lower Leg: Clean wound with Normal Saline. - in office Cleanse wound with mild soap and water Wound #2 Left,Lateral Lower Leg: Clean wound with Normal Saline. - in office Cleanse wound with mild soap and water Primary Wound Dressing: Wound #1 Right,Medial Lower Leg: Hydrafera Blue Ready Transfer Wound #2 Left,Lateral Lower Leg: Hydrafera Blue Ready Transfer Secondary Dressing: Wound #1 Right,Medial Lower Leg: ABD and Kerlix/Conform Wound #2 Left,Lateral Lower Leg: ABD and Kerlix/Conform Dressing Change Frequency: Wound #1 Right,Medial Lower Leg: Change Dressing Monday, Wednesday, Friday Wound #2 Left,Lateral Lower Leg: Change Dressing Monday, Wednesday, Friday Follow-up Appointments: Wound #1 Right,Medial Lower Leg: Return  Appointment in 1 week. Wound #2 Left,Lateral Lower Leg: Return Appointment in 1 week. Home Health: Wound #1 Right,Medial Lower Leg: Continue Home Health Visits - [facility] Home Health Nurse may visit PRN to address patient s wound care needs. FACE TO FACE ENCOUNTER: MEDICARE and MEDICAID PATIENTS: I certify that this patient is under my care and that I had a face-to-face encounter that meets the physician face-to-face encounter requirements with this patient on this date. The encounter with the patient was in whole or in part for the following MEDICAL CONDITION: (primary reason for Home Healthcare) MEDICAL NECESSITY: I certify, that based on my findings, NURSING services are a medically necessary home health service. HOME BOUND STATUS: I certify that my clinical findings support that this patient is homebound (i.e., Due to illness or injury, pt requires aid of supportive devices such as crutches, cane, wheelchairs, walkers, the use of special [place], [place] ([id number]) transportation or the assistance of another person to leave their place of residence. There is a normal inability to leave the home and doing so requires considerable and taxing effort. Other absences are for medical reasons / religious services and are infrequent or of short duration when for other reasons). If current dressing causes regression in wound condition, may D/C ordered dressing product/s and apply Normal Saline Moist Dressing daily until next Wound Healing Center / Other MD appointment. Notify Wound Healing Center of regression in wound condition at [phone]. Please direct any NON-WOUND related issues/requests for orders to patient's Primary Care Physician Wound #2 Left,Lateral Lower Leg: Continue Home Health Visits - [facility] Home Health Nurse may visit PRN to address patient s wound care needs. FACE TO FACE ENCOUNTER: MEDICARE and MEDICAID PATIENTS: I certify that this patient is under my care  and that I had a face-to-face encounter that meets the physician face-to-face encounter requirements with this patient on this date. The encounter with the patient was in whole or in part for the following MEDICAL CONDITION: (primary reason for Home Healthcare) MEDICAL NECESSITY: I certify, that based on my findings, NURSING services are a medically necessary home health service. HOME BOUND STATUS: I certify that my clinical findings support that this patient is homebound (i.e., Due to illness or injury, pt requires aid of supportive devices such as crutches, cane, wheelchairs, walkers, the use of special transportation or the assistance of another person to leave their place of residence. There is a normal inability to leave the home and doing so requires considerable and taxing effort. Other  absences are for medical reasons / religious services and are infrequent or of short duration when for other reasons). If current dressing causes regression in wound condition, may D/C ordered dressing product/s and apply Normal Saline Moist Dressing daily until next Wound Healing Center / Other MD appointment. Notify Wound Healing Center of regression in wound condition at [phone]. Please direct any NON-WOUND related issues/requests for orders to patient's Primary Care Physician 1. At this point I explained to the patient that I do believe that the [facility] dressing coupled with the silver nitrate as needed to prevent over hyper granulation is the best thing to do to allow these areas to heal that is what we initiated today. 2. I also believe that he likely should secure this area with con form and then subsequently this is something that can be changed 3 days a week. He does have home health helping him with the dressing changes at this point. 3. With regard to follow-up I would like to see him in a week although we do need to release from the surgeon in order to take over care at this point. He  is never actually followed up with the surgeon as an outpatient only seen the individual who performed surgery as an inpatient. So far we have not heard anything from them as far as releasing him. In fact when we contacted the surgeon they said that they were not even supposed to see the patient in follow-up and did not give an initial answer although there was and I discussed this with the surgeon at this point. Either way if we cannot get that immediate release the patient is going to figure out what he needs to do in order to get a referral to us to take over the wound care if he would like for us to do this. 4. With regard to his pain medications he did have a telehealth visit with the pain provider and there again to cover him for the 90 days postop after that they recommended he see someone else for continuing ongoing pain management as needed. 5. With regard to his more important medicine such as the anticoagulant therapy he needs to probably go to urgent care ASAP in order to get back on this medication this could be a severe issue for him. Obviously he does have a primary care doctor appointment but has not until 9 February I believe and somewhere around there as well for hematology. Either way I think that in the meantime he needs to make sure he has this medication. We will see patient back for reevaluation in 1 week here in the clinic. If anything worsens or changes patient will contact our office for additional recommendations. Electronic Signature(s) Signed: 05/13/2019 8:32:45 AM By: Lenda KelpStone III, Mallori Araque PA-C Previous Signature: 05/06/2019 11:25:12 AM Version By: Lenda KelpStone III, Rashard Ryle PA-C Entered By: Lenda KelpStone III, Marcelle Hepner on 05/13/2019 08:32:44 David Davies, Aldo ([id number]) -------------------------------------------------------------------------------- ROS/PFSH Details Patient Name: David CarpenLYBURN, Decklyn Date of Service: 05/05/2019 9:45 AM Medical Record Number: [id number] Patient Account Number:  [id number] Date of Birth/Sex: [birth year] (38 y.o. M) Treating RN: Huel CoventryWoody, Kim Primary Care Provider: PATIENT, NO Other Clinician: Referring Provider: Referral, Self Treating Provider/Extender: STONE III, Renso Swett Weeks in Treatment: 0 Information Obtained From Patient Eyes Complaints and Symptoms: Negative for: Dry Eyes; Vision Changes; Glasses / Contacts Ear/Nose/Mouth/Throat Complaints and Symptoms: Negative for: Difficult clearing ears; Sinusitis Hematologic/Lymphatic Complaints and Symptoms: Negative for: Bleeding / Clotting Disorders; Human Immunodeficiency Virus Respiratory Complaints and Symptoms: Negative for: Chronic or  frequent coughs; Shortness of Breath Cardiovascular Complaints and Symptoms: Negative for: Chest pain; LE edema Medical History: Positive for: Hypertension Gastrointestinal Complaints and Symptoms: Negative for: Frequent diarrhea; Nausea; Vomiting Endocrine Complaints and Symptoms: Negative for: Hepatitis; Thyroid disease; Polydypsia (Excessive Thirst) Genitourinary Complaints and Symptoms: Negative for: Kidney failure/ Dialysis; Incontinence/dribbling Immunological Complaints and Symptoms: Negative for: Hives; Itching Arechiga, Qualyn ([id number]) Integumentary (Skin) Complaints and Symptoms: Positive for: Wounds - Fasciotomy Musculoskeletal Complaints and Symptoms: Negative for: Muscle Pain; Muscle Weakness Neurologic Medical History: Positive for: Neuropathy - feet Oncologic Psychiatric Immunizations Pneumococcal Vaccine: Received Pneumococcal Vaccination: No Implantable Devices None Hospitalization / Surgery History Type of Hospitalization/Surgery November [facility] Family and Social History Cancer: No; Diabetes: No; Heart Disease: No; Hypertension: Yes - Mother,Father; Kidney Disease: No; Lung Disease: No; Seizures: No; Stroke: Yes - Mother; Thyroid Problems: No; Tuberculosis: No; Former smoker; Marital Status -  Married; Alcohol Use: Never; Drug Use: No History; Caffeine Use: Moderate Electronic Signature(s) Signed: 05/05/2019 5:20:48 PM By: Elliot Gurney, BSN, RN, CWS, Kim RN, BSN Signed: 05/05/2019 5:39:02 PM By: Lenda Kelp PA-C Entered By: Elliot Gurney BSN, RN, CWS, Kim on 05/05/2019 10:37:00 David Carpen ([id number]) -------------------------------------------------------------------------------- SuperBill Details Patient Name: David Carpen Date of Service: 05/05/2019 Medical Record Number: [id number] Patient Account Number: [id number] Date of Birth/Sex: [birth year] (38 y.o. M) Treating RN: Rodell Perna Primary Care Provider: PATIENT, NO Other Clinician: Referring Provider: Referral, Self Treating Provider/Extender: STONE III, Kiegan Macaraeg Weeks in Treatment: 0 Diagnosis Coding ICD-10 Codes Code Description T81.31XA Disruption of external operation (surgical) wound, not elsewhere classified, initial encounter L98.492 Non-pressure chronic ulcer of skin of other sites with fat layer exposed I73.89 Other specified peripheral vascular diseases E11.622 Type 2 diabetes mellitus with other skin ulcer Z79.01 Long term (current) use of anticoagulants I10 Essential (primary) hypertension Facility Procedures CPT4 Code: 42595638 Description: 99213 - WOUND CARE VISIT-LEV 3 EST PT Modifier: Quantity: 1 CPT4 Code: 75643329 Description: 17250 - CHEM CAUT GRANULATION TISS ICD-10 Diagnosis Description L98.492 Non-pressure chronic ulcer of skin of other sites with fat laye Modifier: r exposed Quantity: 2 Physician Procedures CPT4: Description Modifier Quantity Code 5188416 99204 - WC PHYS LEVEL 4 - NEW PT 25 1 ICD-10 Diagnosis Description T81.31XA Disruption of external operation (surgical) wound, not elsewhere classified, initial encounter L98.492 Non-pressure chronic ulcer  of skin of other sites with fat layer exposed I73.89 Other specified peripheral vascular diseases E11.622 Type 2 diabetes mellitus with other skin  ulcer CPT4: 6063016 17250 - WC PHYS CHEM CAUT GRAN TISSUE 2 ICD-10 Diagnosis Description L98.492 Non-pressure chronic ulcer of skin of other sites with fat layer exposed Electronic Signature(s) Signed: 05/06/2019 9:09:06 AM By: Lenda Kelp PA-C Entered By: Lenda Kelp on 05/06/2019 09:09:06

## 2019-05-05 NOTE — Progress Notes (Signed)
ESCHER, HARR ([id number]) Visit Report for 05/05/2019 Abuse/Suicide Risk Screen Details Patient Name: David Davies, David Davies Date of Service: 05/05/2019 9:45 AM Medical Record Number: [id number] Patient Account Number: [id number] Date of Birth/Sex: November 20, 1980 (38 y.o. Male) Treating RN: Huel Coventry Primary Care Jaevon Paras: PATIENT, NO Other Clinician: Referring Lizanne Erker: Referral, Self Treating Dax Murguia/Extender: STONE III, HOYT Weeks in Treatment: 0 Abuse/Suicide Risk Screen Items Answer ABUSE RISK SCREEN: Has anyone close to you tried to hurt or harm you recentlyo No Do you feel uncomfortable with anyone in your familyo No Has anyone forced you do things that you didnot want to doo No Electronic Signature(s) Signed: 05/05/2019 5:20:48 PM By: Elliot Gurney, BSN, RN, CWS, Kim RN, BSN Entered By: Elliot Gurney, BSN, RN, CWS, Kim on 05/05/2019 10:37:10 [name] ([id number]) -------------------------------------------------------------------------------- Activities of Daily Living Details Patient Name: [name] Date of Service: 05/05/2019 9:45 AM Medical Record Number: [id number] Patient Account Number: [id number] Date of Birth/Sex: 29-May-1980 (38 y.o. Male) Treating RN: Huel Coventry Primary Care Naiara Lombardozzi: PATIENT, NO Other Clinician: Referring Rondell Frick: Referral, Self Treating Manan Olmo/Extender: STONE III, HOYT Weeks in Treatment: 0 Activities of Daily Living Items Answer Activities of Daily Living (Please select one for each item) Drive Automobile Completely Able Take Medications Completely Able Use Telephone Completely Able Care for Appearance Completely Able Use Toilet Completely Able Bath / Shower Completely Able Dress Self Completely Able Feed Self Completely Able Walk Completely Able Get In / Out Bed Completely Able Housework Completely Able Prepare Meals Completely Able Handle Money Completely Able Shop for Self Completely Able Electronic Signature(s) Signed: 05/05/2019 5:20:48 PM By:  Elliot Gurney, BSN, RN, CWS, Kim RN, BSN Entered By: Elliot Gurney, BSN, RN, CWS, Kim on 05/05/2019 10:37:24 [name] ([id number]) -------------------------------------------------------------------------------- Education Screening Details Patient Name: [name] Date of Service: 05/05/2019 9:45 AM Medical Record Number: [id number] Patient Account Number: [id number] Date of Birth/Sex: 08/21/1980 (38 y.o. Male) Treating RN: Huel Coventry Primary Care Shavonna Corella: PATIENT, NO Other Clinician: Referring Deyona Soza: Referral, Self Treating Keith Felten/Extender: Linwood Dibbles, HOYT Weeks in Treatment: 0 Learning Preferences/Education Level/Primary Language Learning Preference: Explanation Highest Education Level: High School Preferred Language: English Cognitive Barrier Language Barrier: No Translator Needed: No Memory Deficit: No Emotional Barrier: No Cultural/Religious Beliefs Affecting Medical Care: No Physical Barrier Impaired Vision: No Impaired Hearing: No Decreased Hand dexterity: No Knowledge/Comprehension Knowledge Level: High Comprehension Level: High Ability to understand written High instructions: Ability to understand verbal High instructions: Motivation Anxiety Level: Calm Cooperation: Cooperative Education Importance: Acknowledges Need Interest in Health Problems: Asks Questions Perception: Coherent Willingness to Engage in Self- High Management Activities: Readiness to Engage in Self- High Management Activities: Psychologist, prison and probation services) Signed: 05/05/2019 5:20:48 PM By: Elliot Gurney, BSN, RN, CWS, Kim RN, BSN Entered By: Elliot Gurney, BSN, RN, CWS, Kim on 05/05/2019 10:37:56 [name] ([id number]) -------------------------------------------------------------------------------- Fall Risk Assessment Details Patient Name: [name] Date of Service: 05/05/2019 9:45 AM Medical Record Number: [id number] Patient Account Number: [id number] Date of Birth/Sex: [birth year] (38 y.o.  Male) Treating RN: Huel Coventry Primary Care Jazelyn Sipe: PATIENT, NO Other Clinician: Referring Careem Yasui: Referral, Self Treating Latoria Dry/Extender: STONE III, HOYT Weeks in Treatment: 0 Fall Risk Assessment Items Have you had 2 or more falls in the last 12 monthso 0 No Have you had any fall that resulted in injury in the last 12 monthso 0 No FALLS RISK SCREEN History of falling - immediate or within 3 months 0 No Secondary diagnosis (Do you have 2 or more medical diagnoseso) 0 No Ambulatory aid None/bed rest/wheelchair/nurse 0 No Crutches/cane/walker 0 No Furniture 0 No Intravenous therapy Access/Saline/Heparin  Lock 0 No Gait/Transferring Normal/ bed rest/ wheelchair 0 No Weak (short steps with or without shuffle, stooped but able to lift head while 0 No walking, may seek support from furniture) Impaired (short steps with shuffle, may have difficulty arising from chair, head 0 No down, impaired balance) Mental Status Oriented to own ability 0 No Electronic Signature(s) Signed: 05/05/2019 5:20:48 PM By: Gretta Cool, BSN, RN, CWS, Kim RN, BSN Entered By: Gretta Cool, BSN, RN, CWS, Kim on 05/05/2019 10:38:07 [name] ([id number]) -------------------------------------------------------------------------------- Foot Assessment Details Patient Name: [name] Date of Service: 05/05/2019 9:45 AM Medical Record Number: [id number] Patient Account Number: [id number] Date of Birth/Sex: [birth year] (38 y.o. Male) Treating RN: Cornell Barman Primary Care Brandey Vandalen: PATIENT, NO Other Clinician: Referring Jaaron Oleson: Referral, Self Treating Mistie Adney/Extender: STONE III, HOYT Weeks in Treatment: 0 Foot Assessment Items Site Locations + = Sensation present, - = Sensation absent, C = Callus, U = Ulcer R = Redness, W = Warmth, M = Maceration, PU = Pre-ulcerative lesion F = Fissure, S = Swelling, D = Dryness Assessment Right: Left: Other Deformity: No No Prior Foot Ulcer: No No Prior Amputation: No  No Charcot Joint: No No Ambulatory Status: Ambulatory Without Help Gait: Steady Electronic Signature(s) Signed: 05/05/2019 5:20:48 PM By: Gretta Cool, BSN, RN, CWS, Kim RN, BSN Entered By: Gretta Cool, BSN, RN, CWS, Kim on 05/05/2019 10:39:03 [name] ([id number]) -------------------------------------------------------------------------------- Nutrition Risk Screening Details Patient Name: [name] Date of Service: 05/05/2019 9:45 AM Medical Record Number: [id number] Patient Account Number: [id number] Date of Birth/Sex: 1980-09-15 (38 y.o. Male) Treating RN: Cornell Barman Primary Care Geniene List: PATIENT, NO Other Clinician: Referring Lahoma Constantin: Referral, Self Treating Petina Muraski/Extender: STONE III, HOYT Weeks in Treatment: 0 Height (in): 66 Weight (lbs): 199 Body Mass Index (BMI): 32.1 Nutrition Risk Screening Items Score Screening NUTRITION RISK SCREEN: I have an illness or condition that made me change the kind and/or amount of 0 No food I eat I eat fewer than two meals per day 0 No I eat few fruits and vegetables, or milk products 0 No I have three or more drinks of beer, liquor or wine almost every day 0 No I have tooth or mouth problems that make it hard for me to eat 0 No I don't always have enough money to buy the food I need 0 No I eat alone most of the time 0 No I take three or more different prescribed or over-the-counter drugs a day 0 No Without wanting to, I have lost or gained 10 pounds in the last six months 0 No I am not always physically able to shop, cook and/or feed myself 0 No Nutrition Protocols Good Risk Protocol Provide education on Moderate Risk Protocol 0 nutrition High Risk Proctocol Risk Level: Good Risk Score: 0 Electronic Signature(s) Signed: 05/05/2019 5:20:48 PM By: Gretta Cool, BSN, RN, CWS, Kim RN, BSN Entered By: Gretta Cool, BSN, RN, CWS, Kim on 05/05/2019 10:38:23

## 2019-05-05 NOTE — Progress Notes (Addendum)
CORLEY, MAFFEO ([id number]) Visit Report for 05/05/2019 Allergy List Details Patient Name: David Davies, David Davies Date of Service: 05/05/2019 9:45 AM Medical Record Number: [id number] Patient Account Number: [id number] Date of Birth/Sex: Jun 11, 1980 (39 y.o. Male) Treating RN: Cornell Barman Primary Care Kyria Bumgardner: PATIENT, NO Other Clinician: Referring Kenniyah Sasaki: Referral, Self Treating Jiraiya Mcewan/Extender: STONE III, HOYT Weeks in Treatment: 0 Allergies Active Allergies No Known Drug Allergies Allergy Notes Electronic Signature(s) Signed: 05/05/2019 5:20:48 PM By: Gretta Cool, BSN, RN, CWS, Kim RN, BSN Entered By: Gretta Cool, BSN, RN, CWS, Kim on 05/05/2019 10:32:44 David Davies ([id number]) -------------------------------------------------------------------------------- Arrival Information Details Patient Name: David Davies Date of Service: 05/05/2019 9:45 AM Medical Record Number: [id number] Patient Account Number: [id number] Date of Birth/Sex: 07-12-1980 (39 y.o. Male) Treating RN: Army Melia Primary Care Reet Scharrer: PATIENT, NO Other Clinician: Referring Brittnie Lewey: Referral, Self Treating Jeanny Rymer/Extender: Melburn Hake, HOYT Weeks in Treatment: 0 Visit Information Patient Arrived: Ambulatory Arrival Time: 10:22 Accompanied By: self Transfer Assistance: None Patient Identification Verified: Yes Secondary Verification Process Completed: Yes Electronic Signature(s) Signed: 05/05/2019 11:15:06 AM By: Lorine Bears RCP, RRT, CHT Entered By: Lorine Bears on 05/05/2019 10:24:32 David Davies ([id number]) -------------------------------------------------------------------------------- Clinic Level of Care Assessment Details Patient Name: David Davies Date of Service: 05/05/2019 9:45 AM Medical Record Number: [id number] Patient Account Number: [id number] Date of Birth/Sex: November 25, 1980 (39 y.o. Male) Treating RN: Army Melia Primary Care Kyonna Frier: PATIENT, NO Other  Clinician: Referring Jahseh Lucchese: Referral, Self Treating Finleigh Cheong/Extender: STONE III, HOYT Weeks in Treatment: 0 Clinic Level of Care Assessment Items TOOL 1 Quantity Score []  - Use when EandM and Procedure is performed on INITIAL visit 0 ASSESSMENTS - Nursing Assessment / Reassessment X - General Physical Exam (combine w/ comprehensive assessment (listed just below) when 1 20 performed on new pt. evals) X- 1 25 Comprehensive Assessment (HX, ROS, Risk Assessments, Wounds Hx, etc.) ASSESSMENTS - Wound and Skin Assessment / Reassessment []  - Dermatologic / Skin Assessment (not related to wound area) 0 ASSESSMENTS - Ostomy and/or Continence Assessment and Care []  - Incontinence Assessment and Management 0 []  - 0 Ostomy Care Assessment and Management (repouching, etc.) PROCESS - Coordination of Care X - Simple Patient / Family Education for ongoing care 1 15 []  - 0 Complex (extensive) Patient / Family Education for ongoing care X- 1 10 Staff obtains Programmer, systems, Records, Test Results / Process Orders []  - 0 Staff telephones HHA, Nursing Homes / Clarify orders / etc []  - 0 Routine Transfer to another Facility (non-emergent condition) []  - 0 Routine Hospital Admission (non-emergent condition) X- 1 15 New Admissions / Biomedical engineer / Ordering NPWT, Apligraf, etc. []  - 0 Emergency Hospital Admission (emergent condition) PROCESS - Special Needs []  - Pediatric / Minor Patient Management 0 []  - 0 Isolation Patient Management []  - 0 Hearing / Language / Visual special needs []  - 0 Assessment of Community assistance (transportation, D/C planning, etc.) []  - 0 Additional assistance / Altered mentation []  - 0 Support Surface(s) Assessment (bed, cushion, seat, etc.) David Davies, David Davies ([id number]) INTERVENTIONS - Miscellaneous []  - External ear exam 0 []  - 0 Patient Transfer (multiple staff / Civil Service fast streamer / Similar devices) []  - 0 Simple Staple / Suture removal (25 or less) []  -  0 Complex Staple / Suture removal (26 or more) []  - 0 Hypo/Hyperglycemic Management (do not check if billed separately) []  - 0 Ankle / Brachial Index (ABI) - do not check if billed separately Has the patient been seen at the hospital within the last three years: Yes Total Score: 85 Level  Of Care: New/Established - Level 3 Electronic Signature(s) Signed: 05/05/2019 4:17:07 PM By: Rodell Perna Entered By: Rodell Perna on 05/05/2019 11:38:40 David Davies ([id number]) -------------------------------------------------------------------------------- Encounter Discharge Information Details Patient Name: David Davies Date of Service: 05/05/2019 9:45 AM Medical Record Number: [id number] Patient Account Number: [id number] Date of Birth/Sex: 1980/04/29 (39 y.o. Male) Treating RN: Rodell Perna Primary Care Dhriti Fales: PATIENT, NO Other Clinician: Referring Nealie Mchatton: Referral, Self Treating Saphira Lahmann/Extender: Linwood Dibbles, HOYT Weeks in Treatment: 0 Encounter Discharge Information Items Discharge Condition: Stable Ambulatory Status: Ambulatory Discharge Destination: Home Transportation: Private Auto Accompanied By: self Schedule Follow-up Appointment: Yes Clinical Summary of Care: Electronic Signature(s) Signed: 05/05/2019 4:17:07 PM By: Rodell Perna Entered By: Rodell Perna on 05/05/2019 11:39:42 David Davies ([id number]) -------------------------------------------------------------------------------- Lower Extremity Assessment Details Patient Name: David Davies Date of Service: 05/05/2019 9:45 AM Medical Record Number: [id number] Patient Account Number: [id number] Date of Birth/Sex: 1980-04-17 (39 y.o. Male) Treating RN: Huel Coventry Primary Care Jemery Stacey: PATIENT, NO Other Clinician: Referring Viriginia Amendola: Referral, Self Treating Laterra Lubinski/Extender: STONE III, HOYT Weeks in Treatment: 0 Edema Assessment Assessed: [Left: No] [Right: No] [Left: Edema] [Right: :] Calf Left: Right: Point of  Measurement: 32 cm From Medial Instep cm 48 cm Ankle Left: Right: Point of Measurement: 11 cm From Medial Instep cm 25 cm Vascular Assessment Pulses: Dorsalis Pedis Palpable: [Right:Yes] Doppler Audible: [Right:Yes] Posterior Tibial Palpable: [Right:Yes Yes] Electronic Signature(s) Signed: 05/05/2019 5:20:48 PM By: Elliot Gurney, BSN, RN, CWS, Kim RN, BSN Entered By: Elliot Gurney, BSN, RN, CWS, Kim on 05/05/2019 10:46:12 David Davies ([id number]) -------------------------------------------------------------------------------- Multi Wound Chart Details Patient Name: David Davies Date of Service: 05/05/2019 9:45 AM Medical Record Number: [id number] Patient Account Number: [id number] Date of Birth/Sex: 1981/01/04 (38 y.o. Male) Treating RN: Rodell Perna Primary Care Hurschel Paynter: PATIENT, NO Other Clinician: Referring Bravery Ketcham: Referral, Self Treating Athol Bolds/Extender: STONE III, HOYT Weeks in Treatment: 0 Vital Signs Height(in): 66 Pulse(bpm): 80 Weight(lbs): 199 Blood Pressure(mmHg): 132/96 Body Mass Index(BMI): 32 Temperature(F): 98.8 Respiratory Rate 16 (breaths/min): Photos: [1:No Photos] [2:No Photos] [N/A:N/A] Wound Location: [1:Right Lower Leg - Medial] [2:Left Lower Leg - Lateral] [N/A:N/A] Wounding Event: [1:Surgical Injury] [2:Surgical Injury] [N/A:N/A] Primary Etiology: [1:Dehisced Wound] [2:Dehisced Wound] [N/A:N/A] Comorbid History: [1:Hypertension, Neuropathy] [2:Hypertension, Neuropathy] [N/A:N/A] Date Acquired: [1:02/19/2019] [2:02/19/2019] [N/A:N/A] Weeks of Treatment: [1:0] [2:0] [N/A:N/A] Wound Status: [1:Open] [2:Open] [N/A:N/A] Measurements L x W x D [1:16x5x0.1] [2:13x3.5x0.1] [N/A:N/A] (cm) Area (cm) : [1:62.832] [2:35.736] [N/A:N/A] Volume (cm) : [1:6.283] [2:3.574] [N/A:N/A] % Reduction in Area: [1:0.00%] [2:N/A] [N/A:N/A] % Reduction in Volume: [1:0.00%] [2:N/A] [N/A:N/A] Classification: [1:Full Thickness Without Exposed Support Structures] [2:Full Thickness  Without Exposed Support Structures] [N/A:N/A] Exudate Amount: [1:Medium] [2:Medium] [N/A:N/A] Exudate Type: [1:Serosanguineous] [2:Serosanguineous] [N/A:N/A] Exudate Color: [1:red, brown] [2:red, brown] [N/A:N/A] Wound Margin: [1:Flat and Intact] [2:Flat and Intact] [N/A:N/A] Granulation Amount: [1:Large (67-100%)] [2:Large (67-100%)] [N/A:N/A] Granulation Quality: [1:Red, Hyper-granulation] [2:Red, Hyper-granulation] [N/A:N/A] Necrotic Amount: [1:None Present (0%)] [2:None Present (0%)] [N/A:N/A] Exposed Structures: [1:Fat Layer (Subcutaneous Tissue) Exposed: Yes Fascia: No Tendon: No Muscle: No Joint: No Bone: No None] [2:Fat Layer (Subcutaneous Tissue) Exposed: Yes Fascia: No Tendon: No Muscle: No Joint: No Bone: No None] [N/A:N/A N/A] Treatment Notes Electronic Signature(s) Signed: 05/05/2019 4:17:07 PM By: Katrina Stack, Kipp Brood ([id number]) Entered By: Rodell Perna on 05/05/2019 11:27:05 David Davies ([id number]) -------------------------------------------------------------------------------- Multi-Disciplinary Care Plan Details Patient Name: David Davies Date of Service: 05/05/2019 9:45 AM Medical Record Number: [id number] Patient Account Number: [id number] Date of Birth/Sex: Sep 04, 1980 (38 y.o. Male) Treating RN: Rodell Perna Primary Care Quita Mcgrory: PATIENT, NO Other Clinician: Referring Fairley Copher: Referral, Self Treating  Tamir Wallman/Extender: Linwood Dibbles, HOYT Weeks in Treatment: 0 Active Inactive Medication Nursing Diagnoses: Knowledge deficit related to medication safety: actual or potential Goals: Patient/caregiver will demonstrate understanding of all current medications Date Initiated: 05/05/2019 Target Resolution Date: 05/20/2019 Goal Status: Active Interventions: Assess patient/caregiver ability to manage medication regimen upon admission and as needed Notes: Orientation to the Wound Care Program Nursing Diagnoses: Knowledge deficit related to the wound healing  center program Goals: Patient/caregiver will verbalize understanding of the Wound Healing Center Program Date Initiated: 05/05/2019 Target Resolution Date: 05/20/2019 Goal Status: Active Interventions: Provide education on orientation to the wound center Notes: Peripheral Neuropathy Nursing Diagnoses: Knowledge deficit related to disease process and management of peripheral neurovascular dysfunction Goals: Patient/caregiver will verbalize understanding of disease process and disease management Date Initiated: 05/05/2019 Target Resolution Date: 05/20/2019 Goal Status: Active Interventions: Assess signs and symptoms of neuropathy upon admission and as needed David Davies, David Davies ([id number]) Notes: Wound/Skin Impairment Nursing Diagnoses: Impaired tissue integrity Goals: Ulcer/skin breakdown will have a volume reduction of 30% by week 4 Date Initiated: 05/05/2019 Target Resolution Date: 05/26/2019 Goal Status: Active Interventions: Assess ulceration(s) every visit Notes: Electronic Signature(s) Signed: 05/05/2019 4:17:07 PM By: Rodell Perna Entered By: Rodell Perna on 05/05/2019 11:26:44 David Davies ([id number]) -------------------------------------------------------------------------------- Pain Assessment Details Patient Name: David Davies Date of Service: 05/05/2019 9:45 AM Medical Record Number: [id number] Patient Account Number: [id number] Date of Birth/Sex: 1980-12-09 (38 y.o. Male) Treating RN: Rodell Perna Primary Care Daysen Gundrum: PATIENT, NO Other Clinician: Referring Chani Ghanem: Referral, Self Treating Chabely Norby/Extender: STONE III, HOYT Weeks in Treatment: 0 Active Problems Location of Pain Severity and Description of Pain Patient Has Paino Yes Site Locations Rate the pain. Current Pain Level: 8 Pain Management and Medication Current Pain Management: Electronic Signature(s) Signed: 05/05/2019 11:15:06 AM By: Dayton Martes RCP, RRT, CHT Signed: 05/05/2019  4:17:07 PM By: Rodell Perna Entered By: Dayton Martes on 05/05/2019 10:24:52 David Davies ([id number]) -------------------------------------------------------------------------------- Patient/Caregiver Education Details Patient Name: David Davies Date of Service: 05/05/2019 9:45 AM Medical Record Number: [id number] Patient Account Number: [id number] Date of Birth/Gender: [birth year] (38 y.o. Male) Treating RN: Rodell Perna Primary Care Physician: PATIENT, NO Other Clinician: Referring Physician: Referral, Self Treating Physician/Extender: Linwood Dibbles, HOYT Weeks in Treatment: 0 Education Assessment Education Provided To: Patient Education Topics Provided Wound/Skin Impairment: Handouts: Caring for Your Ulcer Methods: Demonstration, Explain/Verbal Responses: State content correctly Electronic Signature(s) Signed: 05/05/2019 4:17:07 PM By: Rodell Perna Entered By: Rodell Perna on 05/05/2019 11:38:55 David Davies ([id number]) -------------------------------------------------------------------------------- Wound Assessment Details Patient Name: David Davies Date of Service: 05/05/2019 9:45 AM Medical Record Number: [id number] Patient Account Number: [id number] Date of Birth/Sex: May 18, 1980 (38 y.o. Male) Treating RN: Huel Coventry Primary Care Cadee Agro: PATIENT, NO Other Clinician: Referring Skye Rodarte: Referral, Self Treating Karlee Staff/Extender: STONE III, HOYT Weeks in Treatment: 0 Wound Status Wound Number: 1 Primary Etiology: Dehisced Wound Wound Location: Right Lower Leg - Medial Wound Status: Open Wounding Event: Surgical Injury Comorbid History: Hypertension, Neuropathy Date Acquired: 02/19/2019 Weeks Of Treatment: 0 Clustered Wound: No Photos Wound Measurements Length: (cm) 16 Width: (cm) 5 Depth: (cm) 0.1 Area: (cm) 62.832 Volume: (cm) 6.283 % Reduction in Area: 0% % Reduction in Volume: 0% Epithelialization: None Tunneling: No Undermining:  No Wound Description Full Thickness Without Exposed Support Slough/F Classification: Structures Wound Margin: Flat and Intact Exudate Medium Amount: Exudate Type: Serosanguineous Exudate Color: red, brown ibrino Yes Wound Bed Granulation Amount: Large (67-100%) Exposed Structure Granulation Quality: Red, Hyper-granulation Fascia Exposed: No Necrotic Amount: None Present (0%) Fat Layer (Subcutaneous Tissue) Exposed: Yes  Tendon Exposed: No Muscle Exposed: No Joint Exposed: No Bone Exposed: No David Davies, David Davies ([id number]) Treatment Notes Wound #1 (Right, Medial Lower Leg) Notes H. blue, ABD, conform, double layer tubi grip Electronic Signature(s) Signed: 05/05/2019 4:17:07 PM By: Rodell Perna Signed: 05/05/2019 5:20:48 PM By: Elliot Gurney, BSN, RN, CWS, Kim RN, BSN Entered By: Rodell Perna on 05/05/2019 11:59:09 David Davies ([id number]) -------------------------------------------------------------------------------- Wound Assessment Details Patient Name: David Davies Date of Service: 05/05/2019 9:45 AM Medical Record Number: [id number] Patient Account Number: [id number] Date of Birth/Sex: 11-May-1980 (38 y.o. Male) Treating RN: Huel Coventry Primary Care Loralai Eisman: PATIENT, NO Other Clinician: Referring Siyah Mault: Referral, Self Treating Kendalynn Wideman/Extender: STONE III, HOYT Weeks in Treatment: 0 Wound Status Wound Number: 2 Primary Etiology: Dehisced Wound Wound Location: Left Lower Leg - Lateral Wound Status: Open Wounding Event: Surgical Injury Comorbid History: Hypertension, Neuropathy Date Acquired: 02/19/2019 Weeks Of Treatment: 0 Clustered Wound: No Photos Wound Measurements Length: (cm) 13 Width: (cm) 3.5 Depth: (cm) 0.1 Area: (cm) 35.736 Volume: (cm) 3.574 % Reduction in Area: 0% % Reduction in Volume: 0% Epithelialization: None Tunneling: No Undermining: No Wound Description Full Thickness Without Exposed Support Foul Odo Classification: Structures  Slough/F Wound Margin: Flat and Intact Exudate Medium Amount: Exudate Type: Serosanguineous Exudate Color: red, brown r After Cleansing: No ibrino No Wound Bed Granulation Amount: Large (67-100%) Exposed Structure Granulation Quality: Red, Hyper-granulation Fascia Exposed: No Necrotic Amount: None Present (0%) Fat Layer (Subcutaneous Tissue) Exposed: Yes Tendon Exposed: No Muscle Exposed: No Joint Exposed: No Bone Exposed: No David Davies, David Davies ([id number]) Treatment Notes Wound #2 (Left, Lateral Lower Leg) Notes H. blue, ABD, conform, double layer tubi grip Electronic Signature(s) Signed: 05/05/2019 4:17:07 PM By: Rodell Perna Signed: 05/05/2019 5:20:48 PM By: Elliot Gurney, BSN, RN, CWS, Kim RN, BSN Entered By: Rodell Perna on 05/05/2019 11:59:41 David Davies ([id number]) -------------------------------------------------------------------------------- Vitals Details Patient Name: David Davies Date of Service: 05/05/2019 9:45 AM Medical Record Number: [id number] Patient Account Number: [id number] Date of Birth/Sex: Jul 14, 1980 (39 y.o. Male) Treating RN: Rodell Perna Primary Care Jasmyn Picha: PATIENT, NO Other Clinician: Referring Mckynleigh Mussell: Referral, Self Treating Ulysess Witz/Extender: STONE III, HOYT Weeks in Treatment: 0 Vital Signs Time Taken: 10:20 Temperature (F): 98.8 Height (in): 66 Pulse (bpm): 80 Source: Stated Respiratory Rate (breaths/min): 16 Weight (lbs): 199 Blood Pressure (mmHg): 132/96 Source: Measured Reference Range: 80 - 120 mg / dl Body Mass Index (BMI): 32.1 Electronic Signature(s) Signed: 05/05/2019 11:15:06 AM By: Dayton Martes RCP, RRT, CHT Entered By: Dayton Martes on 05/05/2019 10:26:13

## 2019-05-07 ENCOUNTER — Emergency Department
Admission: EM | Admit: 2019-05-07 | Discharge: 2019-05-07 | Disposition: A | Discharge status: Home / Self Care | Payer: Medicare HMO | Attending: Emergency Medicine | Admitting: Emergency Medicine

## 2019-05-07 ENCOUNTER — Encounter: Payer: Self-pay | Admitting: Emergency Medicine

## 2019-05-07 ENCOUNTER — Other Ambulatory Visit: Payer: Self-pay

## 2019-05-07 ENCOUNTER — Emergency Department: Payer: Medicare HMO

## 2019-05-07 DIAGNOSIS — Z79899 Other long term (current) drug therapy: Secondary | ICD-10-CM | POA: Diagnosis not present

## 2019-05-07 DIAGNOSIS — Z7901 Long term (current) use of anticoagulants: Secondary | ICD-10-CM | POA: Insufficient documentation

## 2019-05-07 DIAGNOSIS — Z89202 Acquired absence of left upper limb, unspecified level: Secondary | ICD-10-CM | POA: Insufficient documentation

## 2019-05-07 DIAGNOSIS — M79604 Pain in right leg: Secondary | ICD-10-CM | POA: Diagnosis present

## 2019-05-07 DIAGNOSIS — S81809S Unspecified open wound, unspecified lower leg, sequela: Secondary | ICD-10-CM | POA: Diagnosis not present

## 2019-05-07 DIAGNOSIS — R0602 Shortness of breath: Secondary | ICD-10-CM | POA: Diagnosis not present

## 2019-05-07 DIAGNOSIS — X58XXXS Exposure to other specified factors, sequela: Secondary | ICD-10-CM | POA: Diagnosis not present

## 2019-05-07 DIAGNOSIS — R0789 Other chest pain: Secondary | ICD-10-CM | POA: Diagnosis not present

## 2019-05-07 HISTORY — DX: Acute embolism and thrombosis of unspecified deep veins of unspecified lower extremity: I82.409

## 2019-05-07 HISTORY — DX: Essential (primary) hypertension: I10

## 2019-05-07 LAB — TROPONIN I (HIGH SENSITIVITY): Troponin I (High Sensitivity): 3 ng/L (ref <18)

## 2019-05-07 LAB — CBC WITH DIFFERENTIAL/PLATELET
Abs Immature Granulocytes: 0.01 10*3/uL (ref 0.00–0.07)
Basophils Absolute: 0 10*3/uL (ref 0.0–0.1)
Basophils Relative: 0 %
Eosinophils Absolute: 0.1 10*3/uL (ref 0.0–0.5)
Eosinophils Relative: 1 %
HCT: 36 % — ABNORMAL LOW (ref 39.0–52.0)
Hemoglobin: 11.5 g/dL — ABNORMAL LOW (ref 13.0–17.0)
Immature Granulocytes: 0 %
Lymphocytes Relative: 38 %
Lymphs Abs: 2.3 10*3/uL (ref 0.7–4.0)
MCH: 25.8 pg — ABNORMAL LOW (ref 26.0–34.0)
MCHC: 31.9 g/dL (ref 30.0–36.0)
MCV: 80.7 fL (ref 80.0–100.0)
Monocytes Absolute: 0.5 10*3/uL (ref 0.1–1.0)
Monocytes Relative: 8 %
Neutro Abs: 3.1 10*3/uL (ref 1.7–7.7)
Neutrophils Relative %: 53 %
Platelets: 398 10*3/uL (ref 150–400)
RBC: 4.46 MIL/uL (ref 4.22–5.81)
RDW: 16.3 % — ABNORMAL HIGH (ref 11.5–15.5)
WBC: 6 10*3/uL (ref 4.0–10.5)
nRBC: 0 % (ref 0.0–0.2)

## 2019-05-07 LAB — COMPREHENSIVE METABOLIC PANEL
ALT: 23 U/L (ref 0–44)
AST: 28 U/L (ref 15–41)
Albumin: 3.3 g/dL — ABNORMAL LOW (ref 3.5–5.0)
Alkaline Phosphatase: 71 U/L (ref 38–126)
Anion gap: 7 (ref 5–15)
BUN: 6 mg/dL (ref 6–20)
CO2: 25 mmol/L (ref 22–32)
Calcium: 9.4 mg/dL (ref 8.9–10.3)
Chloride: 103 mmol/L (ref 98–111)
Creatinine, Ser: 0.79 mg/dL (ref 0.61–1.24)
GFR calc Af Amer: >60 mL/min (ref >60)
GFR calc non Af Amer: >60 mL/min (ref >60)
Glucose, Bld: 129 mg/dL — ABNORMAL HIGH (ref 70–99)
Potassium: 4 mmol/L (ref 3.5–5.1)
Sodium: 135 mmol/L (ref 135–145)
Total Bilirubin: 0.5 mg/dL (ref 0.3–1.2)
Total Protein: 8.2 g/dL — ABNORMAL HIGH (ref 6.5–8.1)

## 2019-05-07 LAB — BRAIN NATRIURETIC PEPTIDE: B Natriuretic Peptide: 51 pg/mL (ref 0.0–100.0)

## 2019-05-07 LAB — PROTIME-INR
INR: 1 (ref 0.8–1.2)
Prothrombin Time: 12.7 seconds (ref 11.4–15.2)

## 2019-05-07 LAB — APTT: aPTT: 26 seconds (ref 24–36)

## 2019-05-07 MED ORDER — LEVOFLOXACIN 500 MG PO TABS: 500.0000 mg | ORAL_TABLET | Freq: Every day | ORAL | 0 refills | Status: AC

## 2019-05-07 MED ORDER — OXYCODONE HCL 5 MG PO TABS
10.0000 mg | ORAL_TABLET | Freq: Once | ORAL | Status: AC
  Administered 2019-05-07: 15:00:00 10 mg via ORAL
  Filled 2019-05-07: qty 2

## 2019-05-07 MED ORDER — OXYCODONE HCL 5 MG PO TABS: 5.0000 mg | ORAL_TABLET | Freq: Once | ORAL | Status: DC

## 2019-05-07 MED ORDER — OXYCODONE-ACETAMINOPHEN 7.5-325 MG PO TABS: 1.0000 | ORAL_TABLET | Freq: Two times a day (BID) | ORAL | 0 refills | Status: DC | PRN

## 2019-05-07 MED ORDER — APIXABAN 5 MG PO TABS: 5.0000 mg | ORAL_TABLET | Freq: Two times a day (BID) | ORAL | 1 refills | Status: DC

## 2019-05-07 MED ORDER — IOHEXOL 350 MG/ML SOLN
75.0000 mL | Freq: Once | INTRAVENOUS | Status: AC | PRN
  Administered 2019-05-07: 14:00:00 75 mL via INTRAVENOUS

## 2019-05-07 MED ORDER — APIXABAN 5 MG PO TABS
5.0000 mg | ORAL_TABLET | Freq: Once | ORAL | Status: AC
  Administered 2019-05-07: 5 mg via ORAL
  Filled 2019-05-07: qty 1

## 2019-05-07 NOTE — ED Triage Notes (Addendum)
Here for pain to right leg over last few days.  Hx of thrombectomy in November last year r/t DVT.  Pt had continued swelling and is s/p fasciotomy as well from November 2020.  Pt has swelling still to RLE.  Appeared somewhat labored after ambulating to triage room.  Has also had some chest tightness and SHOB that is increased with exertion over last few days.  No fever. Dressing to RLE; fasciotomy site remains open.  VSS at this time.  Ran out of blood thinners 2 weeks ago

## 2019-05-07 NOTE — ED Provider Notes (Signed)
CT chest IMPRESSION: 1. No CT evidence for acute pulmonary embolus. 2. 3.3 x 2.2 cm sub-solid ring-like lesion in the right upper lobe. Lesion is likely infectious/inflammatory. Organizing pneumonia could have this appearance although it is typically more peripheral. Neoplasm unlikely given patient age but not excluded. Close follow-up recommended to ensure resolution and repeat CT in 3 months could be performed.. 3. Mild mediastinal lymphadenopathy with prominent lymphoid tissue in the hilar regions bilaterally. Attention on follow-up recommended.   Emily Filbert, MD 05/07/19 [phone]

## 2019-05-07 NOTE — ED Notes (Signed)
U/s at bedside with pt, pt taken to u/s by u/s tech

## 2019-05-07 NOTE — ED Notes (Signed)
Pt verbalized understanding of discharge instructions. NAD at this time. 

## 2019-05-07 NOTE — ED Provider Notes (Signed)
Patient is in no distress, he has run out of his pain medicine as well as his Eliquis.  I will start him back on Eliquis.  Not sure what to make of the lesion seen on chest CT.  He has no other infectious findings, but given that his right lower extremity wounds are open antibiotics may be beneficial.  He states he has follow-up with the wound clinic 5 days from now.  I will try to discharge him with pain medicine, Eliquis and antibiotics.  His compartments are soft right now, I see no clinical signs of compartment syndrome.   Emily Filbert, MD 05/07/19 [phone]

## 2019-05-07 NOTE — ED Provider Notes (Signed)
[facility] Emergency Department Provider Note  ____________________________________________   First MD Initiated Contact with Patient 05/07/19 1347     (approximate)  I have reviewed the triage vital signs and the nursing notes.   HISTORY  Chief Complaint Chest Pain, Shortness of Breath, and Leg Pain    HPI David Davies is a 39 y.o. male with DVT who comes in for right leg pain.  History of thrombectomy in November last year due to DVT.  Also had a fasciotomy in November 2020 as well.  Patient states that he has been off of all of his meds for 2 weeks since he moved from [place] to [place].  He is mostly on Eliquis for his blood thinner.  He was also on 10 mg of oxycodone every 6 hours as well as Lyrica to help with his pain.  Patient states that today he came in because he is been having intermittent chest pains that then cause some pains in his right leg.  His pain is moderate, constant, nothing makes better, nothing makes it worse.  He states he was worried he had another blood clot.  He denies any fevers.  He states that his right leg has not had any increase in size.  No open drainage from his wounds.          Past Medical History:  Diagnosis Date  . Chronic back pain   . Chronic leg pain    left  . Chronic pain   . DVT (deep venous thrombosis) (HCC)   . Left leg paresthesias     There are no problems to display for this patient.   Past Surgical History:  Procedure Laterality Date  . FASCIOTOMY    . THROMBECTOMY      Prior to Admission medications   Medication Sig Start Date End Date Taking? Authorizing Provider  bismuth subsalicylate (PEPTO BISMOL) 262 MG/15ML suspension Take 30 mLs by mouth every 6 (six) hours as needed (stomach pain).     [provider]  ondansetron (ZOFRAN) 4 MG tablet Take 1 tablet (4 mg total) by mouth every 8 (eight) hours as needed for nausea or vomiting. 12/15/14   Mesner, Barbara Cower, MD   oxyCODONE-acetaminophen (PERCOCET) 5-325 MG per tablet Take 2 tablets by mouth every 4 (four) hours as needed. 12/15/14   Mesner, Barbara Cower, MD  gabapentin (NEURONTIN) 300 MG capsule Take 1 capsule (300 mg total) by mouth 3 (three) times daily. Patient not taking: Reported on 12/15/2014 08/22/14 12/15/14  Pecola Lawless, MD  metoprolol succinate (TOPROL-XL) 25 MG 24 hr tablet Take 1 tablet (25 mg total) by mouth 2 (two) times daily. Patient not taking: Reported on 12/15/2014 08/28/14 12/15/14  Pecola Lawless, MD    Allergies Patient has no known allergies.  History reviewed. No pertinent family history.  Social History Social History   Tobacco Use  . Smoking status: Never Smoker  . Smokeless tobacco: Never Used  Substance Use Topics  . Alcohol use: No  . Drug use: No      Review of Systems Constitutional: No fever/chills Eyes: No visual changes. ENT: No sore throat. Cardiovascular: Positive chest pain Respiratory: Positive for shortness of breath Gastrointestinal: No abdominal pain.  No nausea, no vomiting.  No diarrhea.  No constipation. Genitourinary: Negative for dysuria. Musculoskeletal: Negative for back pain.  Positive for right leg pain Skin: Negative for rash. Neurological: Negative for headaches, focal weakness or numbness. All other ROS negative ____________________________________________   PHYSICAL EXAM:  VITAL SIGNS: ED Triage Vitals  Enc Vitals Group     BP 05/07/19 1310 (!) 146/97     Pulse Rate 05/07/19 1309 93     Resp 05/07/19 1309 (!) 24     Temp 05/07/19 1309 98.6 F (37 C)     Temp Source 05/07/19 1309 Oral     SpO2 05/07/19 1309 100 %     Weight 05/07/19 1310 182 lb (82.6 kg)     Height 05/07/19 1310 5\' 6"  (1.676 m)     Head Circumference --      Peak Flow --      Pain Score 05/07/19 1309 8     Pain Loc --      Pain Edu? --      Excl. in GC? --     Constitutional: Alert and oriented. Well appearing and in no acute distress. Eyes:  Conjunctivae are normal. EOMI. Head: Atraumatic. Nose: No congestion/rhinnorhea. Mouth/Throat: Mucous membranes are moist.   Neck: No stridor. Trachea Midline. FROM Cardiovascular: Normal rate, regular rhythm. Grossly normal heart sounds.  Good peripheral circulation. Respiratory: Normal respiratory effort.  No retractions. Lungs CTAB. Gastrointestinal: Soft and nontender. No distention. No abdominal bruits.  Musculoskeletal: No lower extremity tenderness nor edema.  No joint effusions.  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  Dressings removed from the right leg.  No erythema or warmth of the leg.  No pus coming from the wounds.  Muscles look intact from prior fasciotomy.  2+ distal pulse.  Compartments feel soft. Neurologic:  Normal speech and language. No gross focal neurologic deficits are appreciated.  Skin:  Skin is warm, dry and intact. No rash noted. Psychiatric: Mood and affect are normal. Speech and behavior are normal. GU: Deferred   ____________________________________________   LABS (all labs ordered are listed, but only abnormal results are displayed)  Labs Reviewed  COMPREHENSIVE METABOLIC PANEL - Abnormal; Notable for the following components:      Result Value   Glucose, Bld 129 (*)    Total Protein 8.2 (*)    Albumin 3.3 (*)    All other components within normal limits  CBC WITH DIFFERENTIAL/PLATELET - Abnormal; Notable for the following components:   Hemoglobin 11.5 (*)    HCT 36.0 (*)    MCH 25.8 (*)    RDW 16.3 (*)    All other components within normal limits  PROTIME-INR  APTT  BRAIN NATRIURETIC PEPTIDE  TROPONIN I (HIGH SENSITIVITY)  TROPONIN I (HIGH SENSITIVITY)   ____________________________________________   ED ECG REPORT I, 05/09/19, the attending physician, personally viewed and interpreted this ECG.  EKG is normal sinus rate 94, no ST elevation, no T wave inversions, normal  intervals ____________________________________________  RADIOLOGY   Official radiology report(s): Concha Se Venous Img Lower Right (DVT Study)  Result Date: 05/07/2019 CLINICAL DATA:  39 year old male with a history of fasciotomy with swelling EXAM: RIGHT LOWER EXTREMITY VENOUS DOPPLER ULTRASOUND TECHNIQUE: Gray-scale sonography with graded compression, as well as color Doppler and duplex ultrasound were performed to evaluate the lower extremity deep venous systems from the level of the common femoral vein and including the common femoral, femoral, profunda femoral, popliteal and calf veins including the posterior tibial, peroneal and gastrocnemius veins when visible. The superficial great saphenous vein was also interrogated. Spectral Doppler was utilized to evaluate flow at rest and with distal augmentation maneuvers in the common femoral, femoral and popliteal veins. COMPARISON:  None. FINDINGS: Contralateral Common Femoral Vein: Respiratory phasicity is  normal and symmetric with the symptomatic side. No evidence of thrombus. Normal compressibility. Common Femoral Vein: No evidence of thrombus. Normal compressibility, respiratory phasicity and response to augmentation. Saphenofemoral Junction: No evidence of thrombus. Normal compressibility and flow on color Doppler imaging. Profunda Femoral Vein: No evidence of thrombus. Normal compressibility and flow on color Doppler imaging. Femoral Vein: No evidence of thrombus. Normal compressibility, respiratory phasicity and response to augmentation. Popliteal Vein: No evidence of thrombus. Normal compressibility, respiratory phasicity and response to augmentation. Calf Veins: Limited evaluation of the calf veins. Posterior tibial vein appears patent. Superficial Great Saphenous Vein: No evidence of thrombus. Normal compressibility and flow on color Doppler imaging. Venous Reflux:  None. Other Findings:  None. IMPRESSION: Sonographic survey of the right lower extremity  negative for DVT Electronically Signed   By: Gilmer Mor D.O.   On: 05/07/2019 14:22    ____________________________________________   PROCEDURES  Procedure(s) performed (including Critical Care):  Procedures   ____________________________________________   INITIAL IMPRESSION / ASSESSMENT AND PLAN / ED COURSE   Rayden Dock was evaluated in Emergency Department on 05/07/2019 for the symptoms described in the history of present illness. He was evaluated in the context of the global COVID-19 pandemic, which necessitated consideration that the patient might be at risk for infection with the SARS-CoV-2 virus that causes COVID-19. Institutional protocols and algorithms that pertain to the evaluation of patients at risk for COVID-19 are in a state of rapid change based on information released by regulatory bodies including the CDC and federal and state organizations. These policies and algorithms were followed during the patient's care in the ED.    Most Likely DDx:  -Given patient's history of blood clot will get CT PE to evaluate for pulmonary embolism and ultrasound evaluate for blood clot of the leg.  No signs of infection of the leg.  Patient does have fasciotomy openings on his bilateral sides of his legs.  His compartments seem soft and he is a good distal pulse.  I suspect that some of his pain is related to not taking his oxycodone and Lyrica that he was told to be on.  We will give him a dose of oxycodone and get the ultrasound and CT scan and reevaluate patient's leg.  I have low suspicion for arterial occlusion given he is got a great pulse in the foot feels warm to touch.  Also low suspicion for necrotizing fasciitis or infection given patient is afebrile and there is no drainage from his wounds.  DDx that was also considered d/t potential to cause harm, but was found less likely based on history and physical (as detailed above): -PNA (no fevers, cough but CXR to evaluate) -PNX  (reassured with equal b/l breath sounds, CXR to evaluate) -Symptomatic anemia (will get H&H) -Aortic Dissection as no tearing pain and no radiation to the mid back, pulses equal -Pericarditis no rub on exam, EKG changes or hx to suggest dx -Tamponade (no notable SOB, tachycardic, hypotensive) -Esophageal rupture (no h/o diffuse vomitting/no crepitus)  Labs around baseline.  No white count elevation to suggest infection.  Ultrasound of his leg was negative.  Patient given his home oxycodone.  Patient handed off to oncoming team pending CT scanning, reevaluation of his leg.     ____________________________________________   FINAL CLINICAL IMPRESSION(S) / ED DIAGNOSES   Final diagnoses:  None     MEDICATIONS GIVEN DURING THIS VISIT:  Medications  iohexol (OMNIPAQUE) 350 MG/ML injection 75 mL (75 mLs Intravenous Contrast Given 05/07/19  1411)  oxyCODONE (Oxy IR/ROXICODONE) immediate release tablet 10 mg (10 mg Oral Given 05/07/19 1513)     ED Discharge Orders    None       Note:  This document was prepared using Dragon voice recognition software and may include unintentional dictation errors.   Vanessa Titus, MD 05/07/19 1550

## 2019-05-12 ENCOUNTER — Other Ambulatory Visit: Payer: Self-pay

## 2019-05-12 ENCOUNTER — Encounter: Payer: Medicare HMO | Admitting: Physician Assistant

## 2019-05-12 DIAGNOSIS — E11622 Type 2 diabetes mellitus with other skin ulcer: Secondary | ICD-10-CM | POA: Diagnosis not present

## 2019-05-12 NOTE — Progress Notes (Addendum)
GIORDAN, FORDHAM ([id number]) Visit Report for 05/12/2019 Arrival Information Details Patient Name: David Davies, David Davies Date of Service: 05/12/2019 1:00 PM Medical Record Number: [id number] Patient Account Number: [id number] Date of Birth/Sex: 12-25-1980 (39 y.o. M) Treating RN: Rodell Perna Primary Care Florene Brill: PATIENT, NO Other Clinician: Referring Genora Arp: Referral, Self Treating Hopie Pellegrin/Extender: STONE III, HOYT Weeks in Treatment: 1 Visit Information History Since Last Visit Added or deleted any medications: No Patient Arrived: Ambulatory Any new allergies or adverse reactions: No Arrival Time: 13:36 Had a fall or experienced change in No Accompanied By: self activities of daily living that may affect Transfer Assistance: None risk of falls: Patient Identification Verified: Yes Signs or symptoms of abuse/neglect since last visito No Hospitalized since last visit: No Has Dressing in Place as Prescribed: Yes Pain Present Now: Yes Electronic Signature(s) Signed: 05/12/2019 3:29:42 PM By: Rodell Perna Entered By: Rodell Perna on 05/12/2019 13:36:33 [name] ([id number]) -------------------------------------------------------------------------------- Clinic Level of Care Assessment Details Patient Name: [name] Date of Service: 05/12/2019 1:00 PM Medical Record Number: [id number] Patient Account Number: [id number] Date of Birth/Sex: May 07, 1980 (39 y.o. M) Treating RN: Rodell Perna Primary Care Jazelle Achey: PATIENT, NO Other Clinician: Referring Lajada Janes: Referral, Self Treating Regene Mccarthy/Extender: STONE III, HOYT Weeks in Treatment: 1 Clinic Level of Care Assessment Items TOOL 4 Quantity Score []  - Use when only an EandM is performed on FOLLOW-UP visit 0 ASSESSMENTS - Nursing Assessment / Reassessment X - Reassessment of Co-morbidities (includes updates in patient status) 1 10 X- 1 5 Reassessment of Adherence to Treatment Plan ASSESSMENTS - Wound and Skin Assessment /  Reassessment []  - Simple Wound Assessment / Reassessment - one wound 0 X- 2 5 Complex Wound Assessment / Reassessment - multiple wounds []  - 0 Dermatologic / Skin Assessment (not related to wound area) ASSESSMENTS - Focused Assessment []  - Circumferential Edema Measurements - multi extremities 0 []  - 0 Nutritional Assessment / Counseling / Intervention []  - 0 Lower Extremity Assessment (monofilament, tuning fork, pulses) []  - 0 Peripheral Arterial Disease Assessment (using hand held doppler) ASSESSMENTS - Ostomy and/or Continence Assessment and Care []  - Incontinence Assessment and Management 0 []  - 0 Ostomy Care Assessment and Management (repouching, etc.) PROCESS - Coordination of Care X - Simple Patient / Family Education for ongoing care 1 15 []  - 0 Complex (extensive) Patient / Family Education for ongoing care []  - 0 Staff obtains , Records, Test Results / Process Orders []  - 0 Staff telephones HHA, Nursing Homes / Clarify orders / etc []  - 0 Routine Transfer to another Facility (non-emergent condition) []  - 0 Routine Hospital Admission (non-emergent condition) []  - 0 New Admissions / / Ordering NPWT, Apligraf, etc. []  - 0 Emergency Hospital Admission (emergent condition) X- 1 10 Simple Discharge Coordination VEARL, ALLBAUGH ( ) []  - 0 Complex (extensive) Discharge Coordination PROCESS - Special Needs []  - Pediatric / Minor Patient Management 0 []  - 0 Isolation Patient Management []  - 0 Hearing / Language / Visual special needs []  - 0 Assessment of Community assistance (transportation, D/C planning, etc.) []  - 0 Additional assistance / Altered mentation []  - 0 Support Surface(s) Assessment (bed, cushion, seat, etc.) INTERVENTIONS - Wound Cleansing / Measurement []  - Simple Wound Cleansing - one wound 0 X- 2 5 Complex Wound Cleansing - multiple wounds X- 1 5 Wound Imaging (photographs - any number of wounds) []  -  0 Wound Tracing (instead of photographs) []  - 0 Simple Wound Measurement - one wound X- 2 5 Complex Wound Measurement - multiple  wounds INTERVENTIONS - Wound Dressings []  - Small Wound Dressing one or multiple wounds 0 X- 2 15 Medium Wound Dressing one or multiple wounds []  - 0 Large Wound Dressing one or multiple wounds []  - 0 Application of Medications - topical []  - 0 Application of Medications - injection INTERVENTIONS - Miscellaneous []  - External ear exam 0 []  - 0 Specimen Collection (cultures, biopsies, blood, body fluids, etc.) []  - 0 Specimen(s) / Culture(s) sent or taken to Lab for analysis []  - 0 Patient Transfer (multiple staff / / Similar devices) []  - 0 Simple Staple / Suture removal (25 or less) []  - 0 Complex Staple / Suture removal (26 or more) []  - 0 Hypo / Hyperglycemic Management (close monitor of Blood Glucose) []  - 0 Ankle / Brachial Index (ABI) - do not check if billed separately X- 1 5 Vital Signs Cosio, [place] ( ) Has the patient been seen at the hospital within the last three years: Yes Total Score: 110 Level Of Care: New/Established - Level 3 Electronic Signature(s) Signed: 05/12/2019 3:29:42 PM By: Entered By: on 05/12/2019 14:22:16 ( ) -------------------------------------------------------------------------------- Encounter Discharge Information Details Patient Name: Nurse, adult Date of Service: 05/12/2019 1:00 PM Medical Record Number: Patient Account Number: Date of Birth/Sex: 08-13-1980 (39 y.o. M) Treating RN: 05/14/2019 Primary Care Tempie Gibeault: PATIENT, NO Other Clinician: Referring Socorro Ebron: Referral, Self Treating Birdia Jaycox/Extender: Rodell Perna, HOYT Weeks in Treatment: 1 Encounter Discharge Information Items Discharge Condition: Stable Ambulatory Status: Ambulatory Discharge Destination: Home Transportation: Private Auto Accompanied  By: self Schedule Follow-up Appointment: Yes Clinical Summary of Care: Electronic Signature(s) Signed: 05/12/2019 3:29:42 PM By: 05/14/2019 Entered By: [name] on 05/12/2019 14:24:14 [name] (05/14/2019) -------------------------------------------------------------------------------- Lower Extremity Assessment Details Patient Name: [id number] Date of Service: 05/12/2019 1:00 PM Medical Record Number: 10/02/1980 Patient Account Number: 05-05-2006 Date of Birth/Sex: 1980-05-07 (38 y.o. M) Treating RN: 05/14/2019 Primary Care Jarielys Girardot: PATIENT, NO Other Clinician: Referring Shavona Gunderman: Referral, Self Treating Dierre Crevier/Extender: STONE III, HOYT Weeks in Treatment: 1 Edema Assessment Assessed: [Left: No] [Right: No] [Left: Edema] [Right: :] Calf Left: Right: Point of Measurement: 32 cm From Medial Instep cm 47.5 cm Ankle Left: Right: Point of Measurement: 11 cm From Medial Instep cm 24 cm Vascular Assessment Pulses: Dorsalis Pedis Palpable: [Right:Yes] Posterior Tibial Palpable: [Right:Yes] Notes Patient has left BKA. Electronic Signature(s) Signed: 05/12/2019 3:29:42 PM By: Rodell Perna Entered By: 05/14/2019 on 05/12/2019 13:47:01 [id number] ([name]) -------------------------------------------------------------------------------- Multi Wound Chart Details Patient Name: 05/14/2019 Date of Service: 05/12/2019 1:00 PM Medical Record Number: [id number] Patient Account Number: 10/02/1980 Date of Birth/Sex: [birth year] (38 y.o. M) Treating RN: 05/14/2019 Primary Care Geffrey Michaelsen: PATIENT, NO Other Clinician: Referring Yaretzi Ernandez: Referral, Self Treating Sunil Hue/Extender: STONE III, HOYT Weeks in Treatment: 1 Vital Signs Height(in): 66 Pulse(bpm): 84 Weight(lbs): 199 Blood Pressure(mmHg): 153/93 Body Mass Index(BMI): 32 Temperature(F): 98.9 Respiratory Rate 16 (breaths/min): Photos: [N/A:N/A] Wound Location: Right Lower Leg - Medial Right Lower  Leg - Lateral N/A Wounding Event: Surgical Injury Surgical Injury N/A Primary Etiology: Dehisced Wound Dehisced Wound N/A Comorbid History: Hypertension, Neuropathy Hypertension, Neuropathy N/A Date Acquired: 02/19/2019 02/19/2019 N/A Weeks of Treatment: 1 1 N/A Wound Status: Open Open N/A Measurements L x W x D 14.4x3.5x0.1 11x2.5x0.1 N/A (cm) Area (cm) : 39.584 21.598 N/A Volume (cm) : 3.958 2.16 N/A % Reduction in Area: 37.00% 39.60% N/A % Reduction in Volume: 37.00% 39.60% N/A Classification: Full Thickness Without Full Thickness Without N/A Exposed Support  Structures Exposed Support Structures Exudate Amount: Large Medium N/A Exudate Type: Serosanguineous Serosanguineous N/A Exudate Color: red, brown red, brown N/A Wound Margin: Flat and Intact Flat and Intact N/A Granulation Amount: Large (67-100%) Large (67-100%) N/A Granulation Quality: Red, Hyper-granulation Red, Hyper-granulation N/A Necrotic Amount: None Present (0%) None Present (0%) N/A Exposed Structures: Fat Layer (Subcutaneous Fat Layer (Subcutaneous N/A Tissue) Exposed: Yes Tissue) Exposed: Yes Fascia: No Fascia: No Tendon: No Tendon: No Muscle: No Muscle: No Joint: No Joint: No Bone: No Bone: No Stowers, Juwuan ([id number]) Epithelialization: Small (1-33%) None N/A Treatment Notes Electronic Signature(s) Signed: 05/12/2019 3:29:42 PM By: Rodell Perna Entered By: Rodell Perna on 05/12/2019 14:16:27 [name] ([id number]) -------------------------------------------------------------------------------- Multi-Disciplinary Care Plan Details Patient Name: [name] Date of Service: 05/12/2019 1:00 PM Medical Record Number: [id number] Patient Account Number: [id number] Date of Birth/Sex: 1980/10/21 (38 y.o. M) Treating RN: Rodell Perna Primary Care Deasiah Hagberg: PATIENT, NO Other Clinician: Referring Jaxtyn Linville: Referral, Self Treating Lucretia Pendley/Extender: STONE III, HOYT Weeks in Treatment: 1 Active  Inactive Medication Nursing Diagnoses: Knowledge deficit related to medication safety: actual or potential Goals: Patient/caregiver will demonstrate understanding of all current medications Date Initiated: 05/05/2019 Target Resolution Date: 05/20/2019 Goal Status: Active Interventions: Assess patient/caregiver ability to manage medication regimen upon admission and as needed Notes: Orientation to the Wound Care Program Nursing Diagnoses: Knowledge deficit related to the wound healing center program Goals: Patient/caregiver will verbalize understanding of the Wound Healing Center Program Date Initiated: 05/05/2019 Target Resolution Date: 05/20/2019 Goal Status: Active Interventions: Provide education on orientation to the wound center Notes: Peripheral Neuropathy Nursing Diagnoses: Knowledge deficit related to disease process and management of peripheral neurovascular dysfunction Goals: Patient/caregiver will verbalize understanding of disease process and disease management Date Initiated: 05/05/2019 Target Resolution Date: 05/20/2019 Goal Status: Active Interventions: Assess signs and symptoms of neuropathy upon admission and as needed Callan, Kipp Brood ([id number]) Notes: Wound/Skin Impairment Nursing Diagnoses: Impaired tissue integrity Goals: Ulcer/skin breakdown will have a volume reduction of 30% by week 4 Date Initiated: 05/05/2019 Target Resolution Date: 05/26/2019 Goal Status: Active Interventions: Assess ulceration(s) every visit Notes: Electronic Signature(s) Signed: 05/12/2019 3:29:42 PM By: Rodell Perna Entered By: Rodell Perna on 05/12/2019 14:16:18 [name] ([id number]) -------------------------------------------------------------------------------- Pain Assessment Details Patient Name: [name] Date of Service: 05/12/2019 1:00 PM Medical Record Number: [id number] Patient Account Number: [id number] Date of Birth/Sex: 13-Jul-1980 (39 y.o. M) Treating RN:  Rodell Perna Primary Care Aseel Uhde: PATIENT, NO Other Clinician: Referring Masiah Lewing: Referral, Self Treating Erland Vivas/Extender: STONE III, HOYT Weeks in Treatment: 1 Active Problems Location of Pain Severity and Description of Pain Patient Has Paino Yes Site Locations Pain Location: Pain in Ulcers Rate the pain. Current Pain Level: 8 Pain Management and Medication Current Pain Management: Electronic Signature(s) Signed: 05/12/2019 3:29:42 PM By: Rodell Perna Entered By: Rodell Perna on 05/12/2019 13:36:42 [name] ([id number]) -------------------------------------------------------------------------------- Patient/Caregiver Education Details Patient Name: [name] Date of Service: 05/12/2019 1:00 PM Medical Record Number: [id number] Patient Account Number: [id number] Date of Birth/Gender: 07-02-1980 (38 y.o. M) Treating RN: Rodell Perna Primary Care Physician: PATIENT, NO Other Clinician: Referring Physician: Referral, Self Treating Physician/Extender: Linwood Dibbles, HOYT Weeks in Treatment: 1 Education Assessment Education Provided To: Patient Education Topics Provided Wound/Skin Impairment: Handouts: Caring for Your Ulcer Methods: Demonstration, Explain/Verbal Responses: State content correctly Electronic Signature(s) Signed: 05/12/2019 3:29:42 PM By: Rodell Perna Entered By: Rodell Perna on 05/12/2019 14:23:27 [name] ([id number]) -------------------------------------------------------------------------------- Wound Assessment Details Patient Name: [name] Date of Service: 05/12/2019 1:00 PM Medical Record Number: [id number] Patient Account Number: [id number]  Date of Birth/Sex: December 27, 1980 (38 y.o. M) Treating RN: Army Melia Primary Care Yazan Gatling: PATIENT, NO Other Clinician: Referring Shady Bradish: Referral, Self Treating Darion Milewski/Extender: STONE III, HOYT Weeks in Treatment: 1 Wound Status Wound Number: 1 Primary Etiology: Dehisced Wound Wound  Location: Right Lower Leg - Medial Wound Status: Open Wounding Event: Surgical Injury Comorbid History: Hypertension, Neuropathy Date Acquired: 02/19/2019 Weeks Of Treatment: 1 Clustered Wound: No Photos Wound Measurements Length: (cm) 14.4 Width: (cm) 3.5 Depth: (cm) 0.1 Area: (cm) 39.584 Volume: (cm) 3.958 % Reduction in Area: 37% % Reduction in Volume: 37% Epithelialization: Small (1-33%) Tunneling: No Undermining: No Wound Description Full Thickness Without Exposed Support Slough/ Classification: Structures Wound Margin: Flat and Intact Exudate Large Amount: Exudate Type: Serosanguineous Exudate Color: red, brown Fibrino Yes Wound Bed Granulation Amount: Large (67-100%) Exposed Structure Granulation Quality: Red, Hyper-granulation Fascia Exposed: No Necrotic Amount: None Present (0%) Fat Layer (Subcutaneous Tissue) Exposed: Yes Tendon Exposed: No Muscle Exposed: No Joint Exposed: No Bone Exposed: No Otter, [place] ([id number]) Treatment Notes Wound #1 (Right, Medial Lower Leg) Notes hydrafera blue, ABD, conform, double layer tubi grip G Electronic Signature(s) Signed: 05/12/2019 3:29:42 PM By: Army Melia Entered By: Army Melia on 05/12/2019 13:48:07 [name] ([id number]) -------------------------------------------------------------------------------- Wound Assessment Details Patient Name: [name] Date of Service: 05/12/2019 1:00 PM Medical Record Number: [id number] Patient Account Number: [id number] Date of Birth/Sex: Oct 28, 1980 (38 y.o. M) Treating RN: Army Melia Primary Care Brentlee Sciara: PATIENT, NO Other Clinician: Referring Marymargaret Kirker: Referral, Self Treating Andris Brothers/Extender: STONE III, HOYT Weeks in Treatment: 1 Wound Status Wound Number: 2 Primary Etiology: Dehisced Wound Wound Location: Right Lower Leg - Lateral Wound Status: Open Wounding Event: Surgical Injury Comorbid History: Hypertension, Neuropathy Date Acquired:  02/19/2019 Weeks Of Treatment: 1 Clustered Wound: No Photos Wound Measurements Length: (cm) 11 Width: (cm) 2.5 Depth: (cm) 0.1 Area: (cm) 21.598 Volume: (cm) 2.16 % Reduction in Area: 39.6% % Reduction in Volume: 39.6% Epithelialization: None Tunneling: No Undermining: No Wound Description Full Thickness Without Exposed Support Foul Od Classification: Structures Slough/ Wound Margin: Flat and Intact Exudate Medium Amount: Exudate Type: Serosanguineous Exudate Color: red, brown or After Cleansing: No Fibrino No Wound Bed Granulation Amount: Large (67-100%) Exposed Structure Granulation Quality: Red, Hyper-granulation Fascia Exposed: No Necrotic Amount: None Present (0%) Fat Layer (Subcutaneous Tissue) Exposed: Yes Tendon Exposed: No Muscle Exposed: No Joint Exposed: No Bone Exposed: No Creedon, [place] ([id number]) Treatment Notes Wound #2 (Right, Lateral Lower Leg) Notes hydrafera blue, ABD, conform, double layer tubi grip G Electronic Signature(s) Signed: 05/12/2019 3:29:42 PM By: Army Melia Entered By: Army Melia on 05/12/2019 13:48:39 [name] ([id number]) -------------------------------------------------------------------------------- Vitals Details Patient Name: [name] Date of Service: 05/12/2019 1:00 PM Medical Record Number: [id number] Patient Account Number: [id number] Date of Birth/Sex: February 02, 1981 (38 y.o. M) Treating RN: Army Melia Primary Care Teela Narducci: PATIENT, NO Other Clinician: Referring Abbee Cremeens: Referral, Self Treating Kiyoto Slomski/Extender: STONE III, HOYT Weeks in Treatment: 1 Vital Signs Time Taken: 13:36 Temperature (F): 98.9 Height (in): 66 Pulse (bpm): 84 Weight (lbs): 199 Respiratory Rate (breaths/min): 16 Body Mass Index (BMI): 32.1 Blood Pressure (mmHg): 153/93 Reference Range: 80 - 120 mg / dl Electronic Signature(s) Signed: 05/12/2019 3:29:42 PM By: Army Melia Entered By: Army Melia on 05/12/2019 13:37:13

## 2019-05-12 NOTE — Progress Notes (Addendum)
David Davies Davies, David Davies ([id number]) Visit Report for 05/12/2019 Chief Complaint Document Details Patient Name: David Davies Davies, David Davies Davies Date of Service: 05/12/2019 1:00 PM Medical Record Number: [id number] Patient Account Number: [id number] Date of Birth/Sex: 01/23/1981 (39 y.o. M) Treating RN: David Davies PernaScott, David Primary Care Provider: PATIENT, NO Other Clinician: Referring Provider: Referral, Self Treating Provider/Extender: David Davies Davies, David Davies Davies in Treatment: 1 Information Obtained from: Patient Chief Complaint Right LE surgical ulcers Electronic Signature(s) Signed: 05/12/2019 1:22:04 PM By: David Davies Davies, David Davies Fruchter PA-C Entered By: David Davies Davies, David Davies Davies on 05/12/2019 13:22:03 David Davies Davies, David Davies Davies ([id number]) -------------------------------------------------------------------------------- HPI Details Patient Name: David Davies Davies, David Davies Davies Date of Service: 05/12/2019 1:00 PM Medical Record Number: [id number] Patient Account Number: [id number] Date of Birth/Sex: 01/23/1981 (39 y.o. M) Treating RN: David Davies PernaScott, David Primary Care Provider: PATIENT, NO Other Clinician: Referring Provider: Referral, Self Treating Provider/Extender: David Davies Davies, David Davies Davies Davies in Treatment: 1 History of Present Illness HPI Description: 05/06/2019 patient presents today to our clinic with a fairly extensive history which I was able to retrieve in epic. Unfortunately it looks as though beginning on February 15, 2019 the patient did have an initial visit with a podiatrist secondary to what was diagnosed as a fungal foot infection. This was at [facility] he saw David Davies Davies. I actually do not have access to that note incompletion that I can see. Nonetheless the patient subsequently 2 days later ended up going to the [facility] through the emergency department [facility] foot infection. Looking at the discharge summary he was admitted on 02/17/2019 and was not discharged until 03/05/2019. He has history upon admission was that he had been  seen 2 days prior to urgent care and had skin breakdown between his toes. He was given Augmentin and terbinafine at that time. He was instructed to try David Davies salt baths but noted extreme worsening in the pain and went to the ED for further evaluation. He had no injury noted. 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 He is on Xarelto for prophylaxis due to thrombosis that he is previously had. 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 He has no history of diabetes. He also states that he quit smoking in 2017. During the [facility] admission he denied any use of cocaine or marijuana though there was some question about this as it was reported in the chart. He also denied alcohol use. There was however some initial question as to whether or not his arterial flow was affected on the right. Subsequently he ended up being diagnosed with acute critical limb ischemia in the right lower extremity and had a distal embolectomy on 02/17/2019. This thrombus was in the right anterior tibial artery the patient subsequently ended up having a compartment syndrome and fasciotomy on 02/19/2019. They did end up repeating a CT angiogram with runoff to ensure everything was okay due to his severe pain fortunately everything was stable. 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 With that being said he tells me that he is out of his medication right now. It does not look like he has been going to his appointments as directed [place] Subsequently the patient was reevaluated in December on the ninth of 2020. At this point he was again having issues with no evidence of DVT but that was the concern due to his extensive pain  in the and they suggested that he follow-up with pain management and this was initiated at that point. Since that time  he really has not followed up with anybody from an orthopedic standpoint following the fasciotomy. Upon inspection today the patient does have a history of what appears to be diabetes with a hemoglobin A1c checked in November 2020 to be 8.0, hypertension, peripheral neuropathy, and peripheral vascular disease which seems to be recurrent due to thrombi. Unfortunately right now he tells me he is not taking any of his medicines as he has not been able to get a primary care provider but it looks as if he is also missed several appointments he was supposed to have with multiple providers including hematology. 05/12/2019 upon evaluation today patient appears to be doing better in regard to his wounds. He has been tolerating the dressing changes without complication. Fortunately there is no evidence of active infection at this time. No fevers, chills, nausea, vomiting, or diarrhea. We did get released to see him and continue to take over his care in regard to the wounds currently. The patient had to go to the ER in order to get his medications which included his anticoagulant. Fortunately he has established with a primary care provider now. Electronic Signature(s) Signed: 05/12/2019 4:49:16 PM By: David Kelp PA-C Entered By: David Kelp on 05/12/2019 16:49:16 David Davies Davies, David Davies Davies ([id number]) -------------------------------------------------------------------------------- Physical Exam Details Patient Name: David Davies Carpen Date of Service: 05/12/2019 1:00 PM Medical Record Number: [id number] Patient Account Number: [id number] Date of Birth/Sex: Aug 01, 1980 (39 y.o. M) Treating RN: David Davies Davies Primary Care Provider: PATIENT, NO Other Clinician: Referring Provider: Referral, Self Treating Provider/Extender: David Davies Davies, Monifah Freehling Davies in Treatment: 1 Constitutional Obese and well-hydrated in no acute distress. Respiratory normal breathing without difficulty. Psychiatric this patient is able to make  decisions and demonstrates good insight into disease process. Alert and Oriented x 3. pleasant and cooperative. Notes Patient's wound bed currently showed signs of hyper granulation but not nearly as severe as previously noted when I first saw him. Fortunately he seems to be making some progress at this time which is good news. There is no signs of active infection at this time. Electronic Signature(s) Signed: 05/12/2019 4:49:32 PM By: David Kelp PA-C Entered By: David Kelp on 05/12/2019 16:49:32 DESTAN, FRANCHINI ([id number]) -------------------------------------------------------------------------------- Physician Orders Details Patient Name: David Davies Carpen Date of Service: 05/12/2019 1:00 PM Medical Record Number: [id number] Patient Account Number: [id number] Date of Birth/Sex: April 09, 1981 (39 y.o. M) Treating RN: David Davies Davies Primary Care Provider: PATIENT, NO Other Clinician: Referring Provider: Referral, Self Treating Provider/Extender: David Davies Davies, Rogerick Baldwin Davies in Treatment: 1 Verbal / Phone Orders: No Diagnosis Coding ICD-10 Coding Code Description T81.31XA Disruption of external operation (surgical) wound, not elsewhere classified, initial encounter L98.492 Non-pressure chronic ulcer of skin of other sites with fat layer exposed I73.89 Other specified peripheral vascular diseases E11.622 Type 2 diabetes mellitus with other skin ulcer Z79.01 Long term (current) use of anticoagulants I10 Essential (primary) hypertension Wound Cleansing Wound #1 Right,Medial Lower Leg o Clean wound with Normal Saline. - in office o Cleanse wound with mild soap and water Wound #2 Right,Lateral Lower Leg o Clean wound with Normal Saline. - in office o Cleanse wound with mild soap and water Primary Wound Dressing Wound #1 Right,Medial Lower Leg o Hydrafera Blue Ready Transfer Wound #2 Right,Lateral Lower Leg o Hydrafera Blue Ready Transfer Secondary Dressing Wound #1  Right,Medial Lower Leg o ABD and Kerlix/Conform Wound #2 Right,Lateral Lower Leg o ABD  and Kerlix/Conform Dressing Change Frequency Wound #1 Right,Medial Lower Leg o Change Dressing Monday, Wednesday, Friday Wound #2 Right,Lateral Lower Leg o Change Dressing Monday, Wednesday, Friday Follow-up Appointments Wound #1 Right,Medial Lower Leg Grunewald, Kipp Brood ([id number]) o Return Appointment in 1 week. Wound #2 Right,Lateral Lower Leg o Return Appointment in 1 week. Edema Control Wound #1 Right,Medial Lower Leg o Elevate legs to the level of the heart and pump ankles as often as possible o Other: - double layer tubi grip G Wound #2 Right,Lateral Lower Leg o Elevate legs to the level of the heart and pump ankles as often as possible o Other: - double layer tubi grip G Home Health Wound #1 Right,Medial Lower Leg o Continue Home Health Visits - [facility] o Home Health Nurse may visit PRN to address patientos wound care needs. o FACE TO FACE ENCOUNTER: MEDICARE and MEDICAID PATIENTS: I certify that this patient is under my care and that I had a face-to-face encounter that meets the physician face-to-face encounter requirements with this patient on this date. The encounter with the patient was in whole or in part for the following MEDICAL CONDITION: (primary reason for Home Healthcare) MEDICAL NECESSITY: I certify, that based on my findings, NURSING services are a medically necessary home health service. HOME BOUND STATUS: I certify that my clinical findings support that this patient is homebound (i.e., Due to illness or injury, pt requires aid of supportive devices such as crutches, cane, wheelchairs, walkers, the use of special transportation or the assistance of another person to leave their place of residence. There is a normal inability to leave the home and doing so requires considerable and taxing effort. Other absences are for medical reasons /  religious services and are infrequent or of short duration when for other reasons). o If current dressing causes regression in wound condition, may D/C ordered dressing product/s and apply Normal Saline Moist Dressing daily until next Wound Healing Davies / Other MD appointment. Notify Wound Healing Davies of regression in wound condition at [phone]. o Please direct any NON-WOUND related issues/requests for orders to patient's Primary Care Physician Wound #2 Right,Lateral Lower Leg o Continue Home Health Visits - [facility] o Home Health Nurse may visit PRN to address patientos wound care needs. o FACE TO FACE ENCOUNTER: MEDICARE and MEDICAID PATIENTS: I certify that this patient is under my care and that I had a face-to-face encounter that meets the physician face-to-face encounter requirements with this patient on this date. The encounter with the patient was in whole or in part for the following MEDICAL CONDITION: (primary reason for Home Healthcare) MEDICAL NECESSITY: I certify, that based on my findings, NURSING services are a medically necessary home health service. HOME BOUND STATUS: I certify that my clinical findings support that this patient is homebound (i.e., Due to illness or injury, pt requires aid of supportive devices such as crutches, cane, wheelchairs, walkers, the use of special transportation or the assistance of another person to leave their place of residence. There is a normal inability to leave the home and doing so requires considerable and taxing effort. Other absences are for medical reasons / religious services and are infrequent or of short duration when for other reasons). o If current dressing causes regression in wound condition, may D/C ordered dressing product/s and apply Normal Saline Moist Dressing daily until next Wound Healing Davies / Other MD appointment. Notify Wound Healing Davies of regression in wound condition at  [phone]. o Please direct any  NON-WOUND related issues/requests for orders to patient's Primary Care Physician Electronic Signature(s) Signed: 05/12/2019 3:29:42 PM By: David Davies PernaScott, David Signed: 05/12/2019 5:03:37 PM By: David Davies Davies, Jonte Wollam PA-C Entered By: David Davies PernaScott, David on 05/12/2019 14:23:04 David Davies Davies, David Davies Davies ([id number]) David Davies Davies, David Davies Davies ([id number]) -------------------------------------------------------------------------------- Problem List Details Patient Name: David Davies Davies, David Davies Davies Date of Service: 05/12/2019 1:00 PM Medical Record Number: [id number] Patient Account Number: [id number] Date of Birth/Sex: 24-Dec-1980 (39 y.o. 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 layer 05/06/2019 No Yes exposed I73.89 Other specified peripheral vascular diseases 05/06/2019 No Yes E11.622 Type 2 diabetes mellitus with other skin ulcer 05/06/2019 No Yes Z79.01 Long term (current) use of anticoagulants 05/06/2019 No Yes I10 Essential (primary) hypertension 05/06/2019 No Yes Inactive Problems Resolved Problems Electronic Signature(s) Signed: 05/12/2019 1:21:57 PM By: David Davies Davies, Ertha Nabor PA-C Entered By: David Davies Davies, Geordan Xu on 05/12/2019 13:21:57 David Davies Davies, D[place] ([id number]) -------------------------------------------------------------------------------- Progress Note Details Patient Name: David Davies Davies, David Davies Davies Date of Service: 05/12/2019 1:00 PM Medical Record Number: [id number] Patient Account Number: [id number] Date of Birth/Sex: 24-Dec-1980 (39 y.o. M) Treating RN: David Davies PernaScott, David Primary Care Provider: PATIENT, NO Other  Clinician: Referring Provider: Referral, Self Treating Provider/Extender: David Davies Davies, Nimo Verastegui Davies in Treatment: 1 Subjective Chief Complaint Information obtained from Patient Right LE surgical ulcers History of Present Illness (HPI) 05/06/2019 patient presents today to our clinic with a fairly extensive history which I was able to retrieve in epic. Unfortunately it looks as though beginning on February 15, 2019 the patient did have an initial visit with a podiatrist secondary to what was diagnosed as a fungal foot infection. This was at [facility] he saw David Davies Davies. I actually do not have access to that note incompletion that I can see. Nonetheless the patient subsequently 2 days later ended up going to the [facility] through the emergency department where [facility] to a right foot infection. Looking at the discharge summary he was admitted on 02/17/2019 and was not discharged until 03/05/2019. He has history upon admission was that he had been seen 2 days prior to urgent care and had skin breakdown between his toes. He was given Augmentin and terbinafine at that time. He was instructed to try David [facility] salt baths but noted extreme worsening in the pain and went to the ED for further evaluation. He had no injury noted. 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 He is on Xarelto for prophylaxis due to thrombosis that he is previously had. 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 He has no history of diabetes. He also states that he quit smoking in 2017. During the [facility] admission he denied any use of cocaine or marijuana though there was some question about this as it was reported in the chart. He also denied alcohol use. There was however some initial question as to whether or not his arterial flow was affected on the right. Subsequently he ended up being diagnosed with acute  critical limb ischemia in the right lower extremity and had a distal embolectomy on 02/17/2019. This thrombus was in the right anterior tibial artery the patient subsequently ended up  having a compartment syndrome and fasciotomy on 02/19/2019. They did end up repeating a CT angiogram with runoff to ensure everything was okay due to his severe pain fortunately everything was stable. 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 With that being said he tells me that he is out of his medication right now. It does not look like he has been going to his appointments as directed he has recently moved to [facility]. Subsequently the patient was reevaluated in December on the ninth of 2020. At this point he was again having issues with no evidence of DVT but that was the concern due to his extensive pain in the and they suggested that he follow-up with pain management and this was initiated at that point. Since that time he really has not followed up with anybody from an orthopedic standpoint following the fasciotomy. Upon inspection today the patient does have a history of what appears to be diabetes with a hemoglobin A1c checked in November 2020 to be 8.0, hypertension, peripheral neuropathy, and peripheral vascular disease which seems to be recurrent due to thrombi. Unfortunately right now he tells me he is not taking any of his medicines as he has not been able to get a primary care provider but it looks as if he is also missed several appointments he was supposed to have with multiple providers including hematology. 05/12/2019 upon evaluation today patient appears to be doing better in regard to his wounds. He has been tolerating the dressing changes without complication. Fortunately there is no evidence of active infection at this time. No fevers, chills, nausea, vomiting, or diarrhea. We did get released to see him and continue to take over  his care in regard to the wounds currently. The patient had to go to the ER in order to get his medications which included his anticoagulant. Fortunately he has established with a primary care provider now. David Davies Davies, David Davies Davies ([id number]) Objective Constitutional Obese and well-hydrated in no acute distress. Vitals Time Taken: 1:36 PM, Height: 66 in, Weight: 199 lbs, BMI: 32.1, Temperature: 98.9 F, Pulse: 84 bpm, Respiratory Rate: 16 breaths/min, Blood Pressure: 153/93 mmHg. Respiratory normal breathing without difficulty. Psychiatric this patient is able to make decisions and demonstrates good insight into disease process. Alert and Oriented x 3. pleasant and cooperative. General Notes: Patient's wound bed currently showed signs of hyper granulation but not nearly as severe as previously noted when I first saw him. Fortunately he seems to be making some progress at this time which is good news. There is no signs of active infection at this time. Integumentary (Hair, Skin) Wound #1 status is Open. Original cause of wound was Surgical Injury. The wound is located on the Right,Medial Lower Leg. The wound measures 14.4cm length x 3.5cm width x 0.1cm depth; 39.584cm^2 area and 3.958cm^3 volume. There is Fat Layer (Subcutaneous Tissue) Exposed exposed. There is no tunneling or undermining noted. There is a large amount of serosanguineous drainage noted. The wound margin is flat and intact. There is large (67-100%) red, hyper - granulation within the wound bed. There is no necrotic tissue within the wound bed. Wound #2 status is Open. Original cause of wound was Surgical Injury. The wound is located on the Right,Lateral Lower Leg. The wound measures 11cm length x 2.5cm width x 0.1cm depth; 21.598cm^2 area and 2.16cm^3 volume. There is Fat Layer (Subcutaneous Tissue) Exposed exposed. There is no tunneling or undermining noted. There is a medium amount  of serosanguineous drainage noted. The wound  margin is flat and intact. There is large (67-100%) red, hyper - granulation within the wound bed. There is no necrotic tissue within the wound bed. Assessment Active Problems ICD-10 Disruption of external operation (surgical) wound, not elsewhere classified, initial encounter Non-pressure chronic ulcer of skin of other sites with fat layer exposed Other specified peripheral vascular diseases Type 2 diabetes mellitus with other skin ulcer Long term (current) use of anticoagulants Essential (primary) hypertension Plan Wound Cleansing: [facility] Davies, David Davies Davies ([id number]) Wound #1 Right,Medial Lower Leg: Clean wound with Normal Saline. - in office Cleanse wound with mild soap and water Wound #2 Right,Lateral Lower Leg: Clean wound with Normal Saline. - in office Cleanse wound with mild soap and water Primary Wound Dressing: Wound #1 Right,Medial Lower Leg: Hydrafera Blue Ready Transfer Wound #2 Right,Lateral Lower Leg: Hydrafera Blue Ready Transfer Secondary Dressing: Wound #1 Right,Medial Lower Leg: ABD and Kerlix/Conform Wound #2 Right,Lateral Lower Leg: ABD and Kerlix/Conform Dressing Change Frequency: Wound #1 Right,Medial Lower Leg: Change Dressing Monday, Wednesday, Friday Wound #2 Right,Lateral Lower Leg: Change Dressing Monday, Wednesday, Friday Follow-up Appointments: Wound #1 Right,Medial Lower Leg: Return Appointment in 1 week. Wound #2 Right,Lateral Lower Leg: Return Appointment in 1 week. Edema Control: Wound #1 Right,Medial Lower Leg: Elevate legs to the level of the heart and pump ankles as often as possible Other: - double layer tubi grip G Wound #2 Right,Lateral Lower Leg: Elevate legs to the level of the heart and pump ankles as often as possible Other: - double layer tubi grip G Home Health: Wound #1 Right,Medial Lower Leg: Continue Home Health Visits - [facility] Home Health Nurse may visit PRN to address patient s wound care needs. FACE TO FACE  ENCOUNTER: MEDICARE and MEDICAID PATIENTS: I certify that this patient is under my care and that I had a face-to-face encounter that meets the physician face-to-face encounter requirements with this patient on this date. The encounter with the patient was in whole or in part for the following MEDICAL CONDITION: (primary reason for Home Healthcare) MEDICAL NECESSITY: I certify, that based on my findings, NURSING services are a medically necessary home health service. HOME BOUND STATUS: I certify that my clinical findings support that this patient is homebound (i.e., Due to illness or injury, pt requires aid of supportive devices such as crutches, cane, wheelchairs, walkers, the use of special transportation or the assistance of another person to leave their place of residence. There is a normal inability to leave the home and doing so requires considerable and taxing effort. Other absences are for medical reasons / religious services and are infrequent or of short duration when for other reasons). If current dressing causes regression in wound condition, may D/C ordered dressing product/s and apply Normal Saline Moist Dressing daily until next Wound Healing Davies / Other MD appointment. Notify Wound Healing Davies of regression in wound condition at [phone]. Please direct any NON-WOUND related issues/requests for orders to patient's Primary Care Physician Wound #2 Right,Lateral Lower Leg: Continue Home Health Visits - [facility] Home Health Nurse may visit PRN to address patient s wound care needs. FACE TO FACE ENCOUNTER: MEDICARE and MEDICAID PATIENTS: I certify that this patient is under my care and that I had a face-to-face encounter that meets the physician face-to-face encounter requirements with this patient on this date. The encounter with the patient was in whole or in part for the following MEDICAL CONDITION: (primary reason for Home Healthcare) MEDICAL NECESSITY: I  certify,  that based on my findings, NURSING services are a medically necessary home health service. HOME BOUND STATUS: I certify that my clinical findings support that this patient is homebound (i.e., Due to illness or injury, pt requires aid of supportive devices such as crutches, cane, wheelchairs, walkers, the use of special transportation or the assistance of another person to leave their place of residence. There is a normal inability to leave the [place], [place] ([id number]) home and doing so requires considerable and taxing effort. Other absences are for medical reasons / religious services and are infrequent or of short duration when for other reasons). If current dressing causes regression in wound condition, may D/C ordered dressing product/s and apply Normal Saline Moist Dressing daily until next [place] / Other MD appointment. [place] of regression in wound condition at [phone]. Please direct any NON-WOUND related issues/requests for orders to patient's Primary Care Physician 1. My suggestion at this point is good to be that we continue with the current wound care measures which includes Tubigrip along with Hydrofera Blue. This is really what needs to be utilized not phone. Apparently his home health agency has not received the [facility] yet that they apparently have ordered. 2. I would recommend as well the patient continue to elevate his legs as much as possible to help with edema control. He will be seeing vascular shortly once we get clearance from vascular I would like to begin wrapping his leg I did advise the patient that when he sees vascular to inquire as to whether or not it is okay for [place] to wrap and for them to send confirmation to [place]. We will see patient back for reevaluation in 1 week here in the clinic. If anything worsens or changes patient will contact our office for additional recommendations. Electronic Signature(s) Signed: 05/12/2019  4:50:12 PM By: Worthy Keeler PA-C Entered By: Worthy Keeler on 05/12/2019 16:50:12 [name] ([id number]) -------------------------------------------------------------------------------- SuperBill Details Patient Name: [name] Date of Service: 05/12/2019 Medical Record Number: [id number] Patient Account Number: [id number] Date of Birth/Sex: 1981-03-28 (39 y.o. 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.89 Other specified peripheral vascular diseases E11.622 Type 2 diabetes mellitus with other skin ulcer Z79.01 Long term (current) use of anticoagulants I10 Essential (primary) hypertension Facility Procedures CPT4 Code: 94496759 Description: 99213 - WOUND CARE VISIT-LEV 3 EST PT Modifier: Quantity: 1 Physician Procedures CPT4: Description Modifier Quantity Code [id number] 99213 - WC PHYS LEVEL 3 - EST PT 1 ICD-10 Diagnosis Description T81.31XA Disruption of external operation (surgical) wound, not elsewhere classified, initial encounter L98.492 Non-pressure chronic ulcer of  skin of other sites with fat layer exposed I73.89 Other specified peripheral vascular diseases E11.622 Type 2 diabetes mellitus with other skin ulcer Electronic Signature(s) Signed: 05/12/2019 4:50:34 PM By: Worthy Keeler PA-C Entered By: Worthy Keeler on 05/12/2019 16:50:34

## 2019-05-19 ENCOUNTER — Encounter: Payer: Medicare HMO | Admitting: Physician Assistant

## 2019-05-26 ENCOUNTER — Other Ambulatory Visit: Payer: Self-pay

## 2019-05-26 ENCOUNTER — Encounter: Payer: Medicare HMO | Attending: Physician Assistant | Admitting: Physician Assistant

## 2019-05-26 DIAGNOSIS — T8131XA Disruption of external operation (surgical) wound, not elsewhere classified, initial encounter: Secondary | ICD-10-CM | POA: Diagnosis not present

## 2019-05-26 DIAGNOSIS — Z96641 Presence of right artificial hip joint: Secondary | ICD-10-CM | POA: Insufficient documentation

## 2019-05-26 DIAGNOSIS — L98492 Non-pressure chronic ulcer of skin of other sites with fat layer exposed: Secondary | ICD-10-CM | POA: Diagnosis present

## 2019-05-26 DIAGNOSIS — Z87891 Personal history of nicotine dependence: Secondary | ICD-10-CM | POA: Insufficient documentation

## 2019-05-26 DIAGNOSIS — I10 Essential (primary) hypertension: Secondary | ICD-10-CM | POA: Diagnosis not present

## 2019-05-26 DIAGNOSIS — X58XXXA Exposure to other specified factors, initial encounter: Secondary | ICD-10-CM | POA: Insufficient documentation

## 2019-05-26 DIAGNOSIS — I7389 Other specified peripheral vascular diseases: Secondary | ICD-10-CM | POA: Insufficient documentation

## 2019-05-26 DIAGNOSIS — Z7901 Long term (current) use of anticoagulants: Secondary | ICD-10-CM | POA: Insufficient documentation

## 2019-05-26 DIAGNOSIS — E11622 Type 2 diabetes mellitus with other skin ulcer: Secondary | ICD-10-CM | POA: Insufficient documentation

## 2019-05-26 NOTE — Progress Notes (Addendum)
SINCERE, LIUZZI ([id number]) Visit Report for 05/26/2019 Arrival Information Details Patient Name: MURPHY, DUZAN Date of Service: 05/26/2019 1:45 PM Medical Record Number: [id number] Patient Account Number: [id number] Date of Birth/Sex: Jan 01, 1981 (39 y.o. M) Treating RN: Rodell Perna Primary Care Nylene Inlow: PATIENT, NO Other Clinician: Referring Mavis Fichera: Referral, Self Treating Teagen Mcleary/Extender: STONE III, HOYT Weeks in Treatment: 3 Visit Information History Since Last Visit Added or deleted any medications: No Patient Arrived: Ambulatory Any new allergies or adverse reactions: No Arrival Time: 14:36 Had a fall or experienced change in No Accompanied By: self activities of daily living that may affect Transfer Assistance: None risk of falls: Patient Identification Verified: Yes Signs or symptoms of abuse/neglect since last visito No Secondary Verification Process Completed: Yes Hospitalized since last visit: No Implantable device outside of the clinic excluding No cellular tissue based products placed in the center since last visit: Has Dressing in Place as Prescribed: Yes Pain Present Now: Yes Electronic Signature(s) Signed: 05/26/2019 4:36:47 PM By: Dayton Martes RCP, RRT, CHT Entered By: Dayton Martes on 05/26/2019 14:36:52 [name] ([id number]) -------------------------------------------------------------------------------- Encounter Discharge Information Details Patient Name: [name] Date of Service: 05/26/2019 1:45 PM Medical Record Number: [id number] Patient Account Number: [id number] Date of Birth/Sex: 1981-01-06 (38 y.o. M) Treating RN: Rodell Perna Primary Care Vicki Chaffin: PATIENT, NO Other Clinician: Referring Yuleidy Rappleye: Referral, Self Treating Aiana Nordquist/Extender: STONE III, HOYT Weeks in Treatment: 3 Encounter Discharge Information Items Post Procedure Vitals Discharge Condition: Stable Temperature (F): 98.2 Ambulatory  Status: Ambulatory Pulse (bpm): 108 Discharge Destination: Home Respiratory Rate (breaths/min): 16 Transportation: Private Auto Blood Pressure (mmHg): 156/107 Accompanied By: self Schedule Follow-up Appointment: Yes Clinical Summary of Care: Electronic Signature(s) Signed: 05/26/2019 4:09:28 PM By: Rodell Perna Entered By: Rodell Perna on 05/26/2019 15:24:54 [name] ([id number]) -------------------------------------------------------------------------------- Lower Extremity Assessment Details Patient Name: [name] Date of Service: 05/26/2019 1:45 PM Medical Record Number: [id number] Patient Account Number: [id number] Date of Birth/Sex: 03/08/1981 (38 y.o. M) Treating RN: Curtis Sites Primary Care Ohana Birdwell: PATIENT, NO Other Clinician: Referring Lynae Pederson: Referral, Self Treating Javar Eshbach/Extender: STONE III, HOYT Weeks in Treatment: 3 Edema Assessment Assessed: [Left: No] [Right: No] Edema: [Left: Ye] [Right: s] Calf Left: Right: Point of Measurement: 32 cm From Medial Instep cm 46.2 cm Ankle Left: Right: Point of Measurement: 11 cm From Medial Instep cm 23.3 cm Vascular Assessment Pulses: Dorsalis Pedis Palpable: [Right:Yes] Electronic Signature(s) Signed: 05/26/2019 4:40:05 PM By: Curtis Sites Entered By: Curtis Sites on 05/26/2019 14:54:16 [name] ([id number]) -------------------------------------------------------------------------------- Multi Wound Chart Details Patient Name: [name] Date of Service: 05/26/2019 1:45 PM Medical Record Number: [id number] Patient Account Number: [id number] Date of Birth/Sex: 04-30-1980 (39 y.o. M) Treating RN: Rodell Perna Primary Care Kasara Schomer: PATIENT, NO Other Clinician: Referring Zyasia Halbleib: Referral, Self Treating Akacia Boltz/Extender: STONE III, HOYT Weeks in Treatment: 3 Vital Signs Height(in): 66 Pulse(bpm): 108 Weight(lbs): 199 Blood Pressure(mmHg): 156/107 Body Mass Index(BMI):  32 Temperature(F): 98.2 Respiratory Rate 16 (breaths/min): Photos: [N/A:N/A] Wound Location: Right Lower Leg - Medial Right Lower Leg - Lateral N/A Wounding Event: Surgical Injury Surgical Injury N/A Primary Etiology: Dehisced Wound Dehisced Wound N/A Comorbid History: Hypertension, Neuropathy Hypertension, Neuropathy N/A Date Acquired: 02/19/2019 02/19/2019 N/A Weeks of Treatment: 3 3 N/A Wound Status: Open Open N/A Measurements L x W x D 12.4x2.6x0.1 9.1x1.2x0.1 N/A (cm) Area (cm) : 25.321 8.577 N/A Volume (cm) : 2.532 0.858 N/A % Reduction in Area: 59.70% 76.00% N/A % Reduction in Volume: 59.70% 76.00% N/A Classification: Full Thickness Without Full Thickness Without N/A Exposed Support Structures Exposed Support  Structures Exudate Amount: Large Medium N/A Exudate Type: Serosanguineous Serosanguineous N/A Exudate Color: red, brown red, brown N/A Wound Margin: Flat and Intact Flat and Intact N/A Granulation Amount: Large (67-100%) Large (67-100%) N/A Granulation Quality: Red, Hyper-granulation Red, Hyper-granulation N/A Necrotic Amount: None Present (0%) None Present (0%) N/A Exposed Structures: Fat Layer (Subcutaneous Fat Layer (Subcutaneous N/A Tissue) Exposed: Yes Tissue) Exposed: Yes Fascia: No Fascia: No Tendon: No Tendon: No Muscle: No Muscle: No Joint: No Joint: No Bone: No Bone: No Whorley, Riyansh ([id number]) Epithelialization: Small (1-33%) None N/A Treatment Notes Electronic Signature(s) Signed: 05/26/2019 4:09:28 PM By: Rodell Perna Entered By: Rodell Perna on 05/26/2019 15:11:40 [name] ([id number]) -------------------------------------------------------------------------------- Multi-Disciplinary Care Plan Details Patient Name: [name] Date of Service: 05/26/2019 1:45 PM Medical Record Number: [id number] Patient Account Number: [id number] Date of Birth/Sex: February 21, 1981 (39 y.o. M) Treating RN: Rodell Perna Primary Care Kalyse Meharg: PATIENT,  NO Other Clinician: Referring Tinna Kolker: Referral, Self Treating Kimani Hovis/Extender: STONE III, HOYT Weeks in Treatment: 3 Active Inactive Medication Nursing Diagnoses: Knowledge deficit related to medication safety: actual or potential Goals: Patient/caregiver will demonstrate understanding of all current medications Date Initiated: 05/05/2019 Target Resolution Date: 05/20/2019 Goal Status: Active Interventions: Assess patient/caregiver ability to manage medication regimen upon admission and as needed Notes: Orientation to the Wound Care Program Nursing Diagnoses: Knowledge deficit related to the wound healing center program Goals: Patient/caregiver will verbalize understanding of the Wound Healing Center Program Date Initiated: 05/05/2019 Target Resolution Date: 05/20/2019 Goal Status: Active Interventions: Provide education on orientation to the wound center Notes: Peripheral Neuropathy Nursing Diagnoses: Knowledge deficit related to disease process and management of peripheral neurovascular dysfunction Goals: Patient/caregiver will verbalize understanding of disease process and disease management Date Initiated: 05/05/2019 Target Resolution Date: 05/20/2019 Goal Status: Active Interventions: Assess signs and symptoms of neuropathy upon admission and as needed Beauchaine, Kipp Brood ([id number]) Notes: Wound/Skin Impairment Nursing Diagnoses: Impaired tissue integrity Goals: Ulcer/skin breakdown will have a volume reduction of 30% by week 4 Date Initiated: 05/05/2019 Target Resolution Date: 05/26/2019 Goal Status: Active Interventions: Assess ulceration(s) every visit Notes: Electronic Signature(s) Signed: 05/26/2019 4:09:28 PM By: Rodell Perna Entered By: Rodell Perna on 05/26/2019 15:11:32 [name] ([id number]) -------------------------------------------------------------------------------- Pain Assessment Details Patient Name: [name] Date of Service: 05/26/2019  1:45 PM Medical Record Number: [id number] Patient Account Number: [id number] Date of Birth/Sex: 1981-03-15 (39 y.o. M) Treating RN: Rodell Perna Primary Care Jacarri Gesner: PATIENT, NO Other Clinician: Referring Joshwa Hemric: Referral, Self Treating Azari Hasler/Extender: STONE III, HOYT Weeks in Treatment: 3 Active Problems Location of Pain Severity and Description of Pain Patient Has Paino Yes Site Locations Rate the pain. Current Pain Level: 8 Pain Management and Medication Current Pain Management: Electronic Signature(s) Signed: 05/26/2019 4:09:28 PM By: Rodell Perna Signed: 05/26/2019 4:36:47 PM By: Dayton Martes RCP, RRT, CHT Entered By: Dayton Martes on 05/26/2019 14:37:09 [name] ([id number]) -------------------------------------------------------------------------------- Patient/Caregiver Education Details Patient Name: [name] Date of Service: 05/26/2019 1:45 PM Medical Record Number: [id number] Patient Account Number: [id number] Date of Birth/Gender: [birth year] (38 y.o. M) Treating RN: Rodell Perna Primary Care Physician: PATIENT, NO Other Clinician: Referring Physician: Referral, Self Treating Physician/Extender: Linwood Dibbles, HOYT Weeks in Treatment: 3 Education Assessment Education Provided To: Patient Education Topics Provided Wound/Skin Impairment: Handouts: Caring for Your Ulcer Methods: Demonstration, Explain/Verbal Responses: State content correctly Electronic Signature(s) Signed: 05/26/2019 4:09:28 PM By: Rodell Perna Entered By: Rodell Perna on 05/26/2019 15:24:07 [name] ([id number]) -------------------------------------------------------------------------------- Wound Assessment Details Patient Name: [name] Date of Service: 05/26/2019 1:45 PM Medical Record Number: [id number]  Patient Account Number: [id number] Date of Birth/Sex: [birth year] (38 y.o. M) Treating RN: Montey Hora Primary Care Kell Ferris: PATIENT, NO  Other Clinician: Referring Dorlis Judice: Referral, Self Treating Savier Trickett/Extender: STONE III, HOYT Weeks in Treatment: 3 Wound Status Wound Number: 1 Primary Etiology: Dehisced Wound Wound Location: Right Lower Leg - Medial Wound Status: Open Wounding Event: Surgical Injury Comorbid History: Hypertension, Neuropathy Date Acquired: 02/19/2019 Weeks Of Treatment: 3 Clustered Wound: No Photos Wound Measurements Length: (cm) 12.4 Width: (cm) 2.6 Depth: (cm) 0.1 Area: (cm) 25.321 Volume: (cm) 2.532 % Reduction in Area: 59.7% % Reduction in Volume: 59.7% Epithelialization: Small (1-33%) Tunneling: No Undermining: No Wound Description Full Thickness Without Exposed Support Slough/ Classification: Structures Wound Margin: Flat and Intact Exudate Large Amount: Exudate Type: Serosanguineous Exudate Color: red, brown Fibrino Yes Wound Bed Granulation Amount: Large (67-100%) Exposed Structure Granulation Quality: Red, Hyper-granulation Fascia Exposed: No Necrotic Amount: None Present (0%) Fat Layer (Subcutaneous Tissue) Exposed: Yes Tendon Exposed: No Muscle Exposed: No Joint Exposed: No Bone Exposed: No Henckel, Rushil ([id number]) Treatment Notes Wound #1 (Right, Medial Lower Leg) Notes hydrafera blue, ABD, conform, double layer tubi grip G Electronic Signature(s) Signed: 05/26/2019 4:40:05 PM By: Montey Hora Entered By: Montey Hora on 05/26/2019 14:52:36 [name] ([id number]) -------------------------------------------------------------------------------- Wound Assessment Details Patient Name: [name] Date of Service: 05/26/2019 1:45 PM Medical Record Number: [id number] Patient Account Number: [id number] Date of Birth/Sex: [birth year] (39 y.o. M) Treating RN: Montey Hora Primary Care Srah Ake: PATIENT, NO Other Clinician: Referring Hollyanne Schloesser: Referral, Self Treating Verdine Grenfell/Extender: STONE III, HOYT Weeks in Treatment: 3 Wound Status Wound  Number: 2 Primary Etiology: Dehisced Wound Wound Location: Right Lower Leg - Lateral Wound Status: Open Wounding Event: Surgical Injury Comorbid History: Hypertension, Neuropathy Date Acquired: 02/19/2019 Weeks Of Treatment: 3 Clustered Wound: No Photos Wound Measurements Length: (cm) 9.1 Width: (cm) 1.2 Depth: (cm) 0.1 Area: (cm) 8.577 Volume: (cm) 0.858 % Reduction in Area: 76% % Reduction in Volume: 76% Epithelialization: None Tunneling: No Undermining: No Wound Description Full Thickness Without Exposed Support Foul Od Classification: Structures Slough/ Wound Margin: Flat and Intact Exudate Medium Amount: Exudate Type: Serosanguineous Exudate Color: red, brown or After Cleansing: No Fibrino No Wound Bed Granulation Amount: Large (67-100%) Exposed Structure Granulation Quality: Red, Hyper-granulation Fascia Exposed: No Necrotic Amount: None Present (0%) Fat Layer (Subcutaneous Tissue) Exposed: Yes Tendon Exposed: No Muscle Exposed: No Joint Exposed: No Bone Exposed: No Dave, Parvin ([id number]) Treatment Notes Wound #2 (Right, Lateral Lower Leg) Notes hydrafera blue, ABD, conform, double layer tubi grip G Electronic Signature(s) Signed: 05/26/2019 4:40:05 PM By: Montey Hora Entered By: Montey Hora on 05/26/2019 14:53:01 [name] ([id number]) -------------------------------------------------------------------------------- [place] Details Patient Name: [name] Date of Service: 05/26/2019 1:45 PM Medical Record Number: [id number] Patient Account Number: [id number] Date of Birth/Sex: 1980-06-20 (39 y.o. M) Treating RN: Army Melia Primary Care Natally Ribera: PATIENT, NO Other Clinician: Referring Olaf Mesa: Referral, Self Treating Kersten Salmons/Extender: STONE III, HOYT Weeks in Treatment: 3 Vital Signs Time Taken: 14:35 Temperature (F): 98.2 Height (in): 66 Pulse (bpm): 108 Weight (lbs): 199 Respiratory Rate (breaths/min): 16 Body Mass  Index (BMI): 32.1 Blood Pressure (mmHg): 156/107 Reference Range: 80 - 120 mg / dl Electronic Signature(s) Signed: 05/26/2019 4:36:47 PM By: Lorine Bears RCP, RRT, CHT Entered By: Lorine Bears on 05/26/2019 14:37:39

## 2019-05-26 NOTE — Progress Notes (Addendum)
David Davies, Erez ([id number]) Visit Report for 05/26/2019 Chief Complaint Document Details Patient Name: David Davies, [name] Date of Service: 05/26/2019 1:45 PM Medical Record Number: [id number] Patient Account Number: [id number] Date of Birth/Sex: 1980-11-13 (39 y.o. M) Treating RN: Rodell PernaScott, Dajea Primary Care Provider: PATIENT, NO Other Clinician: Referring Provider: Referral, Self Treating Provider/Extender: Linwood DibblesSTONE III, Jahni Paul Weeks in Treatment: 3 Information Obtained from: Patient Chief Complaint Right LE surgical ulcers Electronic Signature(s) Signed: 05/26/2019 2:21:05 PM By: Lenda KelpStone III, Angie Hogg PA-C Entered By: Lenda KelpStone III, Landi Biscardi on 05/26/2019 14:21:04 David Davies, Jaymin ([id number]) -------------------------------------------------------------------------------- Debridement Details Patient Name: David Davies, David Davies Date of Service: 05/26/2019 1:45 PM Medical Record Number: [id number] Patient Account Number: [id number] Date of Birth/Sex: 1980-11-13 (38 y.o. M) Treating RN: Rodell PernaScott, Dajea Primary Care Provider: PATIENT, NO Other Clinician: Referring Provider: Referral, Self Treating Provider/Extender: STONE III, Nathanyl Andujo Weeks in Treatment: 3 Debridement Performed for Wound #2 Right,Lateral Lower Leg Assessment: Performed By: Physician STONE III, Iniko Robles E., PA-C Debridement Type: Debridement Level of Consciousness (Pre- Awake and Alert procedure): Pre-procedure Verification/Time Yes - 15:12 Out Taken: Start Time: 15:12 Pain Control: Lidocaine Total Area Debrided (L x W): 9.1 (cm) x 1.2 (cm) = 10.92 (cm) Tissue and other material Viable, Non-Viable, Eschar, Slough, Subcutaneous, Slough debrided: Level: Skin/Subcutaneous Tissue Debridement Description: Excisional Instrument: Curette Bleeding: Minimum Hemostasis Achieved: Pressure End Time: 15:13 Response to Treatment: Procedure was tolerated well Level of Consciousness Awake and Alert (Post-procedure): Post Debridement Measurements of Total  Wound Length: (cm) 9.1 Width: (cm) 1.2 Depth: (cm) 0.1 Volume: (cm) 0.858 Character of Wound/Ulcer Post Debridement: Stable Post Procedure Diagnosis Same as Pre-procedure Electronic Signature(s) Signed: 05/26/2019 4:09:28 PM By: Rodell PernaScott, Dajea Signed: 05/26/2019 6:17:56 PM By: Lenda KelpStone III, Machai Desmith PA-C Entered By: Rodell PernaScott, Dajea on 05/26/2019 15:12:42 David Davies, Cope ([id number]) -------------------------------------------------------------------------------- Debridement Details Patient Name: David Davies, David Davies Date of Service: 05/26/2019 1:45 PM Medical Record Number: [id number] Patient Account Number: [id number] Date of Birth/Sex: 1980-11-13 (38 y.o. M) Treating RN: Rodell PernaScott, Dajea Primary Care Provider: PATIENT, NO Other Clinician: Referring Provider: Referral, Self Treating Provider/Extender: STONE III, Ismail Graziani Weeks in Treatment: 3 Debridement Performed for Wound #1 Right,Medial Lower Leg Assessment: Performed By: Physician STONE III, [name] E., PA-C Debridement Type: Debridement Level of Consciousness (Pre- Awake and Alert procedure): Pre-procedure Verification/Time Yes - 15:12 Out Taken: Start Time: 15:12 Pain Control: Lidocaine Total Area Debrided (L x W): 12.4 (cm) x 2.6 (cm) = 32.24 (cm) Tissue and other material Viable, Non-Viable, Eschar, Slough, Subcutaneous, Slough debrided: Level: Skin/Subcutaneous Tissue Debridement Description: Excisional Instrument: Curette Bleeding: Minimum Hemostasis Achieved: Pressure End Time: 15:13 Response to Treatment: Procedure was tolerated well Level of Consciousness Awake and Alert (Post-procedure): Post Debridement Measurements of Total Wound Length: (cm) 12.4 Width: (cm) 2.6 Depth: (cm) 0.1 Volume: (cm) 2.532 Character of Wound/Ulcer Post Debridement: Stable Post Procedure Diagnosis Same as Pre-procedure Electronic Signature(s) Signed: 05/26/2019 4:09:28 PM By: Rodell PernaScott, Dajea Signed: 05/26/2019 6:17:56 PM By: Lenda KelpStone III, Zohaib Heeney  PA-C Entered By: Rodell PernaScott, Dajea on 05/26/2019 15:19:31 David Davies, Melburn ([id number]) -------------------------------------------------------------------------------- HPI Details Patient Name: David Davies, David Davies Date of Service: 05/26/2019 1:45 PM Medical Record Number: [id number] Patient Account Number: [id number] Date of Birth/Sex: 1980-11-13 (38 y.o. M) Treating RN: Rodell PernaScott, Dajea Primary Care Provider: PATIENT, NO Other Clinician: Referring Provider: Referral, Self Treating Provider/Extender: Linwood DibblesSTONE III, Drexler Maland Weeks in Treatment: 3 History of Present Illness HPI Description: 05/06/2019 patient presents today to our clinic with a fairly extensive history which I was able to retrieve in epic. Unfortunately it looks as though beginning on February 15, 2019 the patient did have an initial  visit with a podiatrist secondary to what was diagnosed as a fungal foot infection. This was at [facility] he saw Reita Cliche. I actually do not have access to that note incompletion that I can see. Nonetheless the patient subsequently 2 days later ended up going to the hospital through the emergency department where he was admitted at [facility] due to a right foot infection. Looking at the discharge summary he was admitted on 02/17/2019 and was not discharged until 03/05/2019. He has history upon admission was that he had been seen 2 days prior to urgent care and had skin breakdown between his toes. He was given Augmentin and terbinafine at that time. He was instructed to try Epson salt baths but noted extreme worsening in the pain and went to the ED for further evaluation. He had no injury noted. 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 He is on Xarelto for prophylaxis due to thrombosis that he is previously had. 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 He has no history of diabetes. He also states that he quit smoking  in 2017. During the hospital admission he denied any use of cocaine or marijuana though there was some question about this as it was reported in the chart. He also denied alcohol use. There was however some initial question as to whether or not his arterial flow was affected on the right. Subsequently he ended up being diagnosed with acute critical limb ischemia in the right lower extremity and had a distal embolectomy on 02/17/2019. This thrombus was in the right anterior tibial artery the patient subsequently ended up having a compartment syndrome and fasciotomy on 02/19/2019. They did end up repeating a CT angiogram with runoff to ensure everything was okay due to his severe pain fortunately everything was stable. 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 With that being said he tells me that he is out of his medication right now. It does not look like he has been going to his appointments as directed he has recently moved to [facility]. Subsequently the patient was reevaluated in December on the ninth of 2020. At this point he was again having issues with no evidence of DVT but that was the concern due to his extensive pain in the and they suggested that he follow-up with pain management and this was initiated at that point. Since that time he really has not followed up with anybody from an orthopedic standpoint following the fasciotomy. Upon inspection today the patient does have a history of what appears to be diabetes with a hemoglobin A1c checked in November 2020 to be 8.0, hypertension, peripheral neuropathy, and peripheral vascular disease which seems to be recurrent due to thrombi. Unfortunately right now he tells me he is not taking any of his medicines as he has not been able to get a primary care provider but it looks as if he is also missed several appointments he was supposed to have with multiple providers including  hematology. 05/12/2019 upon evaluation today patient appears to be doing better in regard to his wounds. He has been tolerating the dressing changes without complication. Fortunately there is no evidence of active infection at this time. No fevers, chills, nausea, vomiting, or diarrhea. We did get released to see him and continue to take over his care in regard to the wounds  currently. The patient had to go to the ER in order to get his medications which included his anticoagulant. Fortunately he has established with a primary care provider now. 05/26/2019 upon evaluation today patient actually appears to be doing quite well with regard to his wounds. Fortunately there is no signs of active infection at this time. No fevers, chills, nausea, vomiting, or diarrhea. Electronic Signature(s) Signed: 05/26/2019 3:23:03 PM By: Lenda Kelp PA-C Entered By: Lenda Kelp on 05/26/2019 15:23:02 MACE, MORRISETTE ([id number]) -------------------------------------------------------------------------------- Physical Exam Details Patient Name: David Davies Date of Service: 05/26/2019 1:45 PM Medical Record Number: [id number] Patient Account Number: [id number] Date of Birth/Sex: September 20, 1980 (38 y.o. M) Treating RN: Rodell Perna Primary Care Provider: PATIENT, NO Other Clinician: Referring Provider: Referral, Self Treating Provider/Extender: STONE III, Pasqualino Witherspoon Weeks in Treatment: 3 Constitutional Well-nourished and well-hydrated in no acute distress. Respiratory normal breathing without difficulty. Psychiatric this patient is able to make decisions and demonstrates good insight into disease process. Alert and Oriented x 3. pleasant and cooperative. Notes Patient's wounds currently showed signs of minimal hyper granulation I think the Hydrofera Blue is done excellent for him and I am very pleased at this point. There is no evidence of active infection and I feel like that he can continue with the  current wound care measures with good result so far he has made excellent improvement. Home health did discharge him secondary to noncompliance again it seems like he is able to change these at home on his own so hopefully that will be just fine. Electronic Signature(s) Signed: 05/26/2019 3:23:32 PM By: Lenda Kelp PA-C Entered By: Lenda Kelp on 05/26/2019 15:23:32 David Davies ([id number]) -------------------------------------------------------------------------------- Physician Orders Details Patient Name: David Davies Date of Service: 05/26/2019 1:45 PM Medical Record Number: [id number] Patient Account Number: [id number] Date of Birth/Sex: 03-05-1981 (38 y.o. M) Treating RN: Rodell Perna Primary Care Provider: PATIENT, NO Other Clinician: Referring Provider: Referral, Self Treating Provider/Extender: STONE III, Raunak Antuna Weeks in Treatment: 3 Verbal / Phone Orders: No Diagnosis Coding ICD-10 Coding Code Description T81.31XA Disruption of external operation (surgical) wound, not elsewhere classified, initial encounter L98.492 Non-pressure chronic ulcer of skin of other sites with fat layer exposed I73.89 Other specified peripheral vascular diseases E11.622 Type 2 diabetes mellitus with other skin ulcer Z79.01 Long term (current) use of anticoagulants I10 Essential (primary) hypertension Wound Cleansing Wound #1 Right,Medial Lower Leg o Clean wound with Normal Saline. - in office o Cleanse wound with mild soap and water Wound #2 Right,Lateral Lower Leg o Clean wound with Normal Saline. - in office o Cleanse wound with mild soap and water Primary Wound Dressing Wound #1 Right,Medial Lower Leg o Hydrafera Blue Ready Transfer Wound #2 Right,Lateral Lower Leg o Hydrafera Blue Ready Transfer Secondary Dressing Wound #1 Right,Medial Lower Leg o ABD and Kerlix/Conform Wound #2 Right,Lateral Lower Leg o ABD and Kerlix/Conform Dressing Change Frequency Wound  #1 Right,Medial Lower Leg o Change dressing every other day. Follow-up Appointments Wound #1 Right,Medial Lower Leg o Return Appointment in 1 week. Wound #2 Right,Lateral Lower Leg Hajduk, Jeromy ([id number]) o Return Appointment in 1 week. Edema Control Wound #1 Right,Medial Lower Leg o Elevate legs to the level of the heart and pump ankles as often as possible o Other: - double layer tubi grip G Wound #2 Right,Lateral Lower Leg o Elevate legs to the level of the heart and pump ankles as often as possible o Other: - double layer tubi grip G Electronic Signature(s) Signed: 05/26/2019 4:09:28 PM By:  Rodell Perna Signed: 05/26/2019 6:17:56 PM By: Lenda Kelp PA-C Entered By: Rodell Perna on 05/26/2019 15:22:21 David Davies ([id number]) -------------------------------------------------------------------------------- Problem List Details Patient Name: David Davies Date of Service: 05/26/2019 1:45 PM Medical Record Number: [id number] Patient Account Number: [id number] Date of Birth/Sex: 10/13/1980 (38 y.o. 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 layer 05/06/2019 No Yes exposed I73.89 Other specified peripheral vascular diseases 05/06/2019 No Yes E11.622 Type 2 diabetes mellitus with other skin ulcer 05/06/2019 No Yes Z79.01 Long term (current) use of anticoagulants 05/06/2019 No Yes I10 Essential (primary) hypertension 05/06/2019 No Yes Inactive Problems Resolved Problems Electronic Signature(s) Signed: 05/26/2019 2:20:57 PM By: Lenda Kelp  PA-C Entered By: Lenda Kelp on 05/26/2019 14:20:57 David Davies ([id number]) -------------------------------------------------------------------------------- Progress Note Details Patient Name: David Davies Date of Service: 05/26/2019 1:45 PM Medical Record Number: [id number] Patient Account Number: [id number] Date of Birth/Sex: [birth year] (38 y.o. M) Treating RN: Rodell Perna Primary Care Provider: PATIENT, NO Other Clinician: Referring Provider: Referral, Self Treating Provider/Extender: Linwood Dibbles, Luka Stohr Weeks in Treatment: 3 Subjective Chief Complaint Information obtained from Patient Right LE surgical ulcers History of Present Illness (HPI) 05/06/2019 patient presents today to our clinic with a fairly extensive history which I was able to retrieve in epic. Unfortunately it looks as though beginning on February 15, 2019 the patient did have an initial visit with a podiatrist secondary to what was diagnosed as a fungal foot infection. This was at [facility] he saw Reita Cliche. I actually do not have access to that note incompletion that I can see. Nonetheless the patient subsequently 2 days later ended up going to the hospital through the emergency department where he was admitted at [facility] due to a right foot infection. Looking at the discharge summary he was admitted on 02/17/2019 and was not discharged until 03/05/2019. He has history upon admission was that he had been seen 2 days prior to urgent care and had skin breakdown between his toes. He was given Augmentin and terbinafine at that time. He was instructed to try Epson salt baths but noted extreme worsening in the pain and went to the ED for further evaluation. He had no injury noted. 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 He is on Xarelto for prophylaxis due to thrombosis that he is previously had. The patient has undergone a BKA in December 2019 due to critical limb  ischemia and this was at [facility]. He has no history of diabetes. He also states that he quit smoking in 2017. During the hospital admission he denied any use of cocaine or marijuana though there was some question about this as it was reported in the chart. He also denied alcohol use. There was however some initial question as to whether or not his arterial flow was affected on the right. Subsequently he ended up being diagnosed with acute critical limb ischemia in the right lower extremity and had a distal embolectomy on 02/17/2019. This thrombus was in the right anterior tibial artery the patient subsequently ended up having a compartment syndrome and fasciotomy on 02/19/2019. They did end up repeating a CT angiogram with runoff to ensure  everything was okay due to his severe pain fortunately everything was stable. 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 With that being said he tells me that he is out of his medication right now. It does not look like he has been going to his appointments as directed he has recently moved to [facility]. Subsequently the patient was reevaluated in December on the ninth of 2020. At this point he was again having issues with no evidence of DVT but that was the concern due to his extensive pain in the and they suggested that he follow-up with pain management and this was initiated at that point. Since that time he really has not followed up with anybody from an orthopedic standpoint following the fasciotomy. Upon inspection today the patient does have a history of what appears to be diabetes with a hemoglobin A1c checked in November 2020 to be 8.0, hypertension, peripheral neuropathy, and peripheral vascular disease which seems to be recurrent due to thrombi. Unfortunately right now he tells me he is not taking any of his medicines as he has not been able to get a primary care provider but it looks as if he  is also missed several appointments he was supposed to have with multiple providers including hematology. 05/12/2019 upon evaluation today patient appears to be doing better in regard to his wounds. He has been tolerating the dressing changes without complication. Fortunately there is no evidence of active infection at this time. No fevers, chills, nausea, vomiting, or diarrhea. We did get released to see him and continue to take over his care in regard to the wounds currently. The patient had to go to the ER in order to get his medications which included his anticoagulant. Fortunately he has established with a primary care provider now. 05/26/2019 upon evaluation today patient actually appears to be doing quite well with regard to his wounds. Fortunately there is no signs of active infection at this time. No fevers, chills, nausea, vomiting, or diarrhea. JESUA, TAMBLYN ([id number]) Objective Constitutional Well-nourished and well-hydrated in no acute distress. Vitals Time Taken: 2:35 PM, Height: 66 in, Weight: 199 lbs, BMI: 32.1, Temperature: 98.2 F, Pulse: 108 bpm, Respiratory Rate: 16 breaths/min, Blood Pressure: 156/107 mmHg. Respiratory normal breathing without difficulty. Psychiatric this patient is able to make decisions and demonstrates good insight into disease process. Alert and Oriented x 3. pleasant and cooperative. General Notes: Patient's wounds currently showed signs of minimal hyper granulation I think the Hydrofera Blue is done excellent for him and I am very pleased at this point. There is no evidence of active infection and I feel like that he can continue with the current wound care measures with good result so far he has made excellent improvement. Home health did discharge him secondary to noncompliance again it seems like he is able to change these at home on his own so hopefully that will be just fine. Integumentary (Hair, Skin) Wound #1 status is Open. Original cause  of wound was Surgical Injury. The wound is located on the Right,Medial Lower Leg. The wound measures 12.4cm length x 2.6cm width x 0.1cm depth; 25.321cm^2 area and 2.532cm^3 volume. There is Fat Layer (Subcutaneous Tissue) Exposed exposed. There is no tunneling or undermining noted. There is a large amount of serosanguineous drainage noted. The wound margin is flat and intact. There is large (67-100%) red, hyper - granulation within the wound bed. There is no necrotic tissue within the wound bed.  Wound #2 status is Open. Original cause of wound was Surgical Injury. The wound is located on the Right,Lateral Lower Leg. The wound measures 9.1cm length x 1.2cm width x 0.1cm depth; 8.577cm^2 area and 0.858cm^3 volume. There is Fat Layer (Subcutaneous Tissue) Exposed exposed. There is no tunneling or undermining noted. There is a medium amount of serosanguineous drainage noted. The wound margin is flat and intact. There is large (67-100%) red, hyper - granulation within the wound bed. There is no necrotic tissue within the wound bed. Assessment Active Problems ICD-10 Disruption of external operation (surgical) wound, not elsewhere classified, initial encounter Non-pressure chronic ulcer of skin of other sites with fat layer exposed Other specified peripheral vascular diseases Type 2 diabetes mellitus with other skin ulcer Long term (current) use of anticoagulants Essential (primary) hypertension Ambrosius, Lebo ([id number]) Procedures Wound #1 Pre-procedure diagnosis of Wound #1 is a Dehisced Wound located on the Right,Medial Lower Leg . There was a Excisional Skin/Subcutaneous Tissue Debridement with a total area of 32.24 sq cm performed by STONE III, Contessa Preuss E., PA-C. With the following instrument(s): Curette to remove Viable and Non-Viable tissue/material. Material removed includes Eschar, Subcutaneous Tissue, and Slough after achieving pain control using Lidocaine. A time out was conducted at  15:12, prior to the start of the procedure. A Minimum amount of bleeding was controlled with Pressure. The procedure was tolerated well. Post Debridement Measurements: 12.4cm length x 2.6cm width x 0.1cm depth; 2.532cm^3 volume. Character of Wound/Ulcer Post Debridement is stable. Post procedure Diagnosis Wound #1: Same as Pre-Procedure Wound #2 Pre-procedure diagnosis of Wound #2 is a Dehisced Wound located on the Right,Lateral Lower Leg . There was a Excisional Skin/Subcutaneous Tissue Debridement with a total area of 10.92 sq cm performed by STONE III, Cavon Nicolls E., PA-C. With the following instrument(s): Curette to remove Viable and Non-Viable tissue/material. Material removed includes Eschar, Subcutaneous Tissue, and Slough after achieving pain control using Lidocaine. A time out was conducted at 15:12, prior to the start of the procedure. A Minimum amount of bleeding was controlled with Pressure. The procedure was tolerated well. Post Debridement Measurements: 9.1cm length x 1.2cm width x 0.1cm depth; 0.858cm^3 volume. Character of Wound/Ulcer Post Debridement is stable. Post procedure Diagnosis Wound #2: Same as Pre-Procedure Plan Wound Cleansing: Wound #1 Right,Medial Lower Leg: Clean wound with Normal Saline. - in office Cleanse wound with mild soap and water Wound #2 Right,Lateral Lower Leg: Clean wound with Normal Saline. - in office Cleanse wound with mild soap and water Primary Wound Dressing: Wound #1 Right,Medial Lower Leg: Hydrafera Blue Ready Transfer Wound #2 Right,Lateral Lower Leg: Hydrafera Blue Ready Transfer Secondary Dressing: Wound #1 Right,Medial Lower Leg: ABD and Kerlix/Conform Wound #2 Right,Lateral Lower Leg: ABD and Kerlix/Conform Dressing Change Frequency: Wound #1 Right,Medial Lower Leg: Change dressing every other day. Follow-up Appointments: Wound #1 Right,Medial Lower Leg: Return Appointment in 1 week. Wound #2 Right,Lateral Lower Leg: Return  Appointment in 1 week. Edema Control: Wound #1 Right,Medial Lower Leg: Elevate legs to the level of the heart and pump ankles as often as possible Mannella, Refoel ([id number]) Other: - double layer tubi grip G Wound #2 Right,Lateral Lower Leg: Elevate legs to the level of the heart and pump ankles as often as possible Other: - double layer tubi grip G 1. I would recommend currently that we continue with the current wound care measures specifically with regard to the [facility] dressing. We will also continue with the double layer Tubigrip. 2. This will be covered with ABD  pads for drainage control and again the patient states he can change this on his own at home. 3. I do recommend that he continue to elevate his legs as much as possible. I think that can help with edema control as well. We will see patient back for reevaluation in 1 week here in the clinic. If anything worsens or changes patient will contact our office for additional recommendations. Electronic Signature(s) Signed: 05/26/2019 3:24:06 PM By: Lenda KelpStone III, Analysa Nutting PA-C Entered By: Lenda KelpStone III, Prosper Paff on 05/26/2019 15:24:06 David Davies, Ayush ([id number]) -------------------------------------------------------------------------------- SuperBill Details Patient Name: David Davies, Krista Date of Service: 05/26/2019 Medical Record Number: [id number] Patient Account Number: [id number] Date of Birth/Sex: Mar 17, 1981 (38 y.o. M) Treating RN: Rodell PernaScott, Dajea Primary Care Provider: PATIENT, NO Other Clinician: Referring Provider: Referral, Self Treating Provider/Extender: STONE III, Da Authement Weeks in Treatment: 3 Diagnosis Coding ICD-10 Codes Code Description T81.31XA Disruption of external operation (surgical) wound, not elsewhere classified, initial encounter L98.492 Non-pressure chronic ulcer of skin of other sites with fat layer exposed I73.89 Other specified peripheral vascular diseases E11.622 Type 2 diabetes mellitus with other skin  ulcer Z79.01 Long term (current) use of anticoagulants I10 Essential (primary) hypertension Facility Procedures CPT4 Code: 7829562136100012 Description: 11042 - DEB SUBQ TISSUE 20 SQ CM/< ICD-10 Diagnosis Description L98.492 Non-pressure chronic ulcer of skin of other sites with fat lay Modifier: er exposed Quantity: 1 CPT4 Code: [id number] Description: 11045 - DEB SUBQ TISS EA ADDL 20CM ICD-10 Diagnosis Description L98.492 Non-pressure chronic ulcer of skin of other sites with fat lay Modifier: er exposed Quantity: 2 Physician Procedures CPT4 Code: [id number] Description: 11042 - WC PHYS SUBQ TISS 20 SQ CM ICD-10 Diagnosis Description L98.492 Non-pressure chronic ulcer of skin of other sites with fat laye Modifier: r exposed Quantity: 1 CPT4 Code: [id number] Description: 11045 - WC PHYS SUBQ TISS EA ADDL 20 CM ICD-10 Diagnosis Description L98.492 Non-pressure chronic ulcer of skin of other sites with fat laye Modifier: r exposed Quantity: 2 Electronic Signature(s) Signed: 05/26/2019 3:24:45 PM By: Lenda KelpStone III, Calin Ellery PA-C Entered By: Lenda KelpStone III, Edeline Greening on 05/26/2019 15:24:45

## 2019-06-02 ENCOUNTER — Ambulatory Visit: Payer: Medicare HMO | Admitting: Physician Assistant

## 2019-06-05 ENCOUNTER — Emergency Department: Payer: Medicare HMO

## 2019-06-05 ENCOUNTER — Other Ambulatory Visit: Payer: Self-pay

## 2019-06-05 ENCOUNTER — Emergency Department
Admission: EM | Admit: 2019-06-05 | Discharge: 2019-06-05 | Disposition: A | Discharge status: Home / Self Care | Payer: Medicare HMO | Attending: Student | Admitting: Student

## 2019-06-05 DIAGNOSIS — R202 Paresthesia of skin: Secondary | ICD-10-CM | POA: Insufficient documentation

## 2019-06-05 DIAGNOSIS — I10 Essential (primary) hypertension: Secondary | ICD-10-CM | POA: Insufficient documentation

## 2019-06-05 DIAGNOSIS — Z79899 Other long term (current) drug therapy: Secondary | ICD-10-CM | POA: Diagnosis not present

## 2019-06-05 DIAGNOSIS — Z89512 Acquired absence of left leg below knee: Secondary | ICD-10-CM | POA: Insufficient documentation

## 2019-06-05 DIAGNOSIS — I739 Peripheral vascular disease, unspecified: Secondary | ICD-10-CM | POA: Diagnosis not present

## 2019-06-05 DIAGNOSIS — R079 Chest pain, unspecified: Secondary | ICD-10-CM | POA: Diagnosis present

## 2019-06-05 DIAGNOSIS — Z7901 Long term (current) use of anticoagulants: Secondary | ICD-10-CM | POA: Insufficient documentation

## 2019-06-05 LAB — CBC
HCT: 33 % — ABNORMAL LOW (ref 39.0–52.0)
Hemoglobin: 10.6 g/dL — ABNORMAL LOW (ref 13.0–17.0)
MCH: 25.7 pg — ABNORMAL LOW (ref 26.0–34.0)
MCHC: 32.1 g/dL (ref 30.0–36.0)
MCV: 80.1 fL (ref 80.0–100.0)
Platelets: 283 10*3/uL (ref 150–400)
RBC: 4.12 MIL/uL — ABNORMAL LOW (ref 4.22–5.81)
RDW: 17.4 % — ABNORMAL HIGH (ref 11.5–15.5)
WBC: 6.1 10*3/uL (ref 4.0–10.5)
nRBC: 0 % (ref 0.0–0.2)

## 2019-06-05 LAB — COMPREHENSIVE METABOLIC PANEL
ALT: 33 U/L (ref 0–44)
AST: 52 U/L — ABNORMAL HIGH (ref 15–41)
Albumin: 3.5 g/dL (ref 3.5–5.0)
Alkaline Phosphatase: 67 U/L (ref 38–126)
Anion gap: 7 (ref 5–15)
BUN: <5 mg/dL — ABNORMAL LOW (ref 6–20)
CO2: 29 mmol/L (ref 22–32)
Calcium: 9.1 mg/dL (ref 8.9–10.3)
Chloride: 97 mmol/L — ABNORMAL LOW (ref 98–111)
Creatinine, Ser: 0.76 mg/dL (ref 0.61–1.24)
GFR calc Af Amer: >60 mL/min (ref >60)
GFR calc non Af Amer: >60 mL/min (ref >60)
Glucose, Bld: 105 mg/dL — ABNORMAL HIGH (ref 70–99)
Potassium: 5 mmol/L (ref 3.5–5.1)
Sodium: 133 mmol/L — ABNORMAL LOW (ref 135–145)
Total Bilirubin: 1.1 mg/dL (ref 0.3–1.2)
Total Protein: 7.8 g/dL (ref 6.5–8.1)

## 2019-06-05 LAB — TROPONIN I (HIGH SENSITIVITY)
Troponin I (High Sensitivity): <2 ng/L (ref <18)
Troponin I (High Sensitivity): <2 ng/L (ref <18)

## 2019-06-05 MED ORDER — IOHEXOL 350 MG/ML SOLN
75.0000 mL | Freq: Once | INTRAVENOUS | Status: AC | PRN
  Administered 2019-06-05: 75 mL via INTRAVENOUS

## 2019-06-05 MED ORDER — IOHEXOL 350 MG/ML SOLN
75.0000 mL | Freq: Once | INTRAVENOUS | Status: AC | PRN
  Administered 2019-06-05: 22:00:00 75 mL via INTRAVENOUS

## 2019-06-05 MED ORDER — METOPROLOL SUCCINATE ER 25 MG PO TB24: 25.0000 mg | ORAL_TABLET | Freq: Every day | ORAL | 0 refills | Status: DC

## 2019-06-05 MED ORDER — MORPHINE SULFATE (PF) 4 MG/ML IV SOLN
4.0000 mg | Freq: Once | INTRAVENOUS | Status: AC
  Administered 2019-06-05: 4 mg via INTRAVENOUS
  Filled 2019-06-05: qty 1

## 2019-06-05 MED ORDER — SODIUM CHLORIDE 0.9 % IV BOLUS
1000.0000 mL | Freq: Once | INTRAVENOUS | Status: AC
  Administered 2019-06-05: 1000 mL via INTRAVENOUS

## 2019-06-05 MED ORDER — NITROGLYCERIN 0.4 MG SL SUBL
0.4000 mg | SUBLINGUAL_TABLET | Freq: Once | SUBLINGUAL | Status: AC
  Administered 2019-06-05: 0.4 mg via SUBLINGUAL
  Filled 2019-06-05: qty 1

## 2019-06-05 NOTE — ED Notes (Signed)
This RN attempted to insert IV and was unsuccessful. Dewayne Hatch, RN at bedside attempting now.

## 2019-06-05 NOTE — ED Triage Notes (Signed)
Pt with hx of htn and previous MI. Pt ran out of bp 3 days ago. Pt states midsternal chest pain started about an hour ago with radiation to left shoulder.

## 2019-06-05 NOTE — ED Notes (Signed)
Pt transported to CT ?

## 2019-06-05 NOTE — ED Notes (Signed)
MD Colon Branch states that pt only needs to receive 500 mL of 1L fluid bolus and this is able to be discharged from ED

## 2019-06-05 NOTE — ED Provider Notes (Signed)
[facility] Emergency Department Provider Note  ____________________________________________   First MD Initiated Contact with Patient 06/05/19 1853     (approximate)  I have reviewed the triage vital signs and the nursing notes.  History  Chief Complaint Chest Pain    HPI David Davies is a 39 y.o. 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 Patient states chest pain started about 1 hour prior to arrival and has been constant since onset. Describes a left sided pressure/squeezing sensation. 9/10 in severity. Radiates somewhat to the shoulder. No nausea, no SOB at present, perhaps some mild SOB at onset of symptoms. No alleviation with ASA or nitro. No aggravating components. Reports compliance w/ all of his mediations.    Past Medical Hx Past Medical History:  Diagnosis Date  . Chronic back pain   . Chronic leg pain    left  . Chronic pain   . DVT (deep venous thrombosis) ([place])   . Hypertension   . Left leg paresthesias     Problem List There are no problems to display for this patient.   Past Surgical Hx Past Surgical History:  Procedure Laterality Date  . FASCIOTOMY    . THROMBECTOMY      Medications Prior to Admission medications   Medication Sig Start Date End Date Taking? Authorizing Provider  apixaban (ELIQUIS) 5 MG TABS tablet Take 1 tablet (5 mg total) by mouth 2 (two) times daily. 05/07/19   Earleen Newport, MD  bismuth subsalicylate (PEPTO BISMOL) 262 MG/15ML suspension Take 30 mLs by mouth every 6 (six) hours as needed (stomach pain).     [provider]  ondansetron (ZOFRAN) 4 MG tablet Take 1 tablet (4 mg total) by mouth every 8 (eight) hours as needed for nausea or vomiting. 12/15/14   Mesner, Corene Cornea, MD  oxyCODONE-acetaminophen (PERCOCET) 5-325 MG per tablet Take 2 tablets by mouth every 4  (four) hours as needed. 12/15/14   Mesner, Corene Cornea, MD  oxyCODONE-acetaminophen (PERCOCET) 7.5-325 MG tablet Take 1 tablet by mouth 2 (two) times daily as needed for severe pain. 05/07/19 05/06/20  Earleen Newport, MD  gabapentin (NEURONTIN) 300 MG capsule Take 1 capsule (300 mg total) by mouth 3 (three) times daily. Patient not taking: Reported on 12/15/2014 08/22/14 12/15/14  Hendricks Limes, MD  metoprolol succinate (TOPROL-XL) 25 MG 24 hr tablet Take 1 tablet (25 mg total) by mouth 2 (two) times daily. Patient not taking: Reported on 12/15/2014 08/28/14 12/15/14  Hendricks Limes, MD    Allergies Patient has no known allergies.  Family Hx History reviewed. No pertinent family history.  Social Hx Social History   Tobacco Use  . Smoking status: Never Smoker  . Smokeless tobacco: Never Used  Substance Use Topics  . Alcohol use: No  . Drug use: No     Review of Systems  Constitutional: Negative for fever, chills. Eyes: Negative for visual changes. ENT: Negative for sore throat. Cardiovascular: + for chest pain. Respiratory: Negative for shortness of breath. Gastrointestinal: Negative for nausea, vomiting.  Genitourinary: Negative for dysuria. Musculoskeletal: Negative for leg swelling. Skin: Negative for rash. Neurological: Negative for headaches.   Physical Exam  Vital Signs: ED Triage Vitals  Enc Vitals Group     BP 06/05/19 1855 (!) 176/120     Pulse Rate 06/05/19 1855 98     Resp 06/05/19 1855 20  Temp 06/05/19 1855 97.6 F (36.4 C)     Temp Source 06/05/19 1855 Oral     SpO2 06/05/19 1855 98 %     Weight 06/05/19 1856 170 lb (77.1 kg)     Height 06/05/19 1856 5\' 6"  (1.676 m)     Head Circumference --      Peak Flow --      Pain Score 06/05/19 1856 9     Pain Loc --      Pain Edu? --      Excl. in GC? --     Constitutional: Alert and oriented.  Head: Normocephalic. Atraumatic. Eyes: Conjunctivae clear. Sclera anicteric. Nose: No congestion. No  rhinorrhea. Mouth/Throat: Wearing mask.  Neck: No stridor.   Cardiovascular: Normal rate, regular rhythm. Extremities well perfused. Respiratory: Normal respiratory effort.  Lungs CTAB. Gastrointestinal: Soft. Non-tender. Non-distended.  Musculoskeletal: s/p BKA on LEFT. Dressing to RLE.  Neurologic:  Normal speech and language. No gross focal neurologic deficits are appreciated.  Skin: Skin is warm, dry and intact. No rash noted. Psychiatric: Mood and affect are appropriate for situation.  EKG  Personally reviewed.   Rate: 101 Rhythm: sinus Axis: normal Intervals: WNL Non-specific changes in III Sinus tachycardia No STEMI    Radiology  CXR: IMPRESSION:  No active disease.   CT PE: IMPRESSION:  1. No pulmonary embolus or acute aortic syndrome.  2. Multifocal ground-glass opacity in the right upper lobe, slightly  improved from the prior study and again favored to be infectious or  inflammatory.    Procedures  Procedure(s) performed (including critical care):  Procedures   Initial Impression / Assessment and Plan / ED Course  39 y.o. male who presents to the ED for chest pain, as above.  Ddx: ACS, PE, MSK, pleurisy, GERD  Will evaluate with labs, imaging, EKG  EKG w/o acute ischemic changes. Initial troponin negative.   Note: erroneously ordered CT angio of head instead of chest. Updated patient on this, and need for repeat imaging to r/o PE, he is agreeable. His renal function is normal, will bolus with 500 cc IVF given he will require a 2nd contrast load. Discussed with CT as well who agree with PE scanning with another contrast load.   Troponin x 2 negative. Imaging negative for PE. As such, patient stable for discharge with outpatient follow up. He voices understanding and is agreeable with the plan and discharge.  Given information for cardiology for follow-up as well as refill of his prescription for his hypertensive medications.   Final Clinical  Impression(s) / ED Diagnosis  Final diagnoses:  Chest pain       Note:  This document was prepared using Dragon voice recognition software and may include unintentional dictation errors.   30., MD 06/06/19 [phone]

## 2019-06-05 NOTE — Discharge Instructions (Addendum)
Thank you for letting us take care of you in the emergency department today.   Please continue to take any regular, prescribed medications.   Please follow up with: - Your primary care doctor to review your ER visit and follow up on your symptoms.  - Cardiology doctor, information below  Please return to the ER for any new or worsening symptoms.   

## 2019-06-09 ENCOUNTER — Ambulatory Visit: Payer: Medicare HMO | Admitting: Physician Assistant

## 2019-06-16 ENCOUNTER — Ambulatory Visit: Payer: Medicare HMO | Admitting: Physician Assistant

## 2019-07-08 ENCOUNTER — Encounter: Payer: Medicare HMO | Attending: Physician Assistant | Admitting: Physician Assistant

## 2019-07-08 ENCOUNTER — Other Ambulatory Visit: Payer: Self-pay

## 2019-07-08 DIAGNOSIS — Z87891 Personal history of nicotine dependence: Secondary | ICD-10-CM | POA: Diagnosis not present

## 2019-07-08 DIAGNOSIS — T8131XA Disruption of external operation (surgical) wound, not elsewhere classified, initial encounter: Secondary | ICD-10-CM | POA: Diagnosis not present

## 2019-07-08 DIAGNOSIS — E1151 Type 2 diabetes mellitus with diabetic peripheral angiopathy without gangrene: Secondary | ICD-10-CM | POA: Diagnosis not present

## 2019-07-08 DIAGNOSIS — Z89519 Acquired absence of unspecified leg below knee: Secondary | ICD-10-CM | POA: Insufficient documentation

## 2019-07-08 DIAGNOSIS — Y838 Other surgical procedures as the cause of abnormal reaction of the patient, or of later complication, without mention of misadventure at the time of the procedure: Secondary | ICD-10-CM | POA: Diagnosis not present

## 2019-07-08 DIAGNOSIS — I10 Essential (primary) hypertension: Secondary | ICD-10-CM | POA: Insufficient documentation

## 2019-07-08 DIAGNOSIS — Z96641 Presence of right artificial hip joint: Secondary | ICD-10-CM | POA: Diagnosis not present

## 2019-07-08 DIAGNOSIS — E11622 Type 2 diabetes mellitus with other skin ulcer: Secondary | ICD-10-CM | POA: Diagnosis present

## 2019-07-08 DIAGNOSIS — Z7901 Long term (current) use of anticoagulants: Secondary | ICD-10-CM | POA: Insufficient documentation

## 2019-07-08 NOTE — Progress Notes (Addendum)
David Davies ([id number]) Visit Report for 07/08/2019 Chief Complaint Document Details Patient Name: David Davies Date of Service: 07/08/2019 12:30 PM Medical Record Number: [id number] Patient Account Number: [id number] Date of Birth/Sex: 02/06/1981 (39 y.o. M) Treating RN: Rodell Perna Primary Care Provider: PATIENT, NO Other Clinician: Referring Provider: Referral, Self Treating Provider/Extender: Linwood Dibbles, Hampton Wixom Weeks in Treatment: 9 Information Obtained from: Patient Chief Complaint Right LE surgical ulcers Electronic Signature(s) Signed: 07/08/2019 12:42:11 PM By: Lenda Kelp PA-C Entered By: Lenda Kelp on 07/08/2019 12:42:11 David Davies ([id number]) -------------------------------------------------------------------------------- HPI Details Patient Name: David Davies Date of Service: 07/08/2019 12:30 PM Medical Record Number: [id number] Patient Account Number: [id number] Date of Birth/Sex: 27-Feb-1981 (38 y.o. M) Treating RN: Rodell Perna Primary Care Provider: PATIENT, NO Other Clinician: Referring Provider: Referral, Self Treating Provider/Extender: STONE III, Cordaro Mukai Weeks in Treatment: 9 History of Present Illness HPI Description: 05/06/2019 patient presents today to our clinic with a fairly extensive history which I was able to retrieve in epic. Unfortunately it looks as though beginning on February 15, 2019 the patient did have an initial visit with a podiatrist secondary to what was diagnosed as a fungal foot infection. This was at [facility] he saw Reita Cliche. I actually do not have access to that note incompletion that I can see. Nonetheless the patient subsequently 2 days later ended up going to the hospital through the emergency department where he was admitted at [facility] due to a right foot infection. Looking at the discharge summary he was admitted on 02/17/2019 and was not discharged until 03/05/2019. He has history upon admission was that he had  been seen 2 days prior to urgent care and had skin breakdown between his toes. He was given Augmentin and terbinafine at that time. He was instructed to try Epson salt baths but noted extreme worsening in the pain and went to the ED for further evaluation. He had no injury noted. 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 He is on Xarelto for prophylaxis due to thrombosis that he is previously had. 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 He has no history of diabetes. He also states that he quit smoking in 2017. During the hospital admission he denied any use of cocaine or marijuana though there was some question about this as it was reported in the chart. He also denied alcohol use. There was however some initial question as to whether or not his arterial flow was affected on the right. Subsequently he ended up being diagnosed with acute critical limb ischemia in the right lower extremity and had a distal embolectomy on 02/17/2019. This thrombus was in the right anterior tibial artery the patient subsequently ended up having a compartment syndrome and fasciotomy on 02/19/2019. They did end up repeating a CT angiogram with runoff to ensure everything was okay due to his severe pain fortunately everything was stable. 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 With that being said he tells me that he is out of his medication right now. It does not look like he has been going to his appointments as directed he has recently moved to [facility]. Subsequently the patient was reevaluated in December on the ninth of 2020. At this point he was again having issues with no evidence of DVT but that was the concern due to his extensive pain  in the and they suggested that he follow-up with pain management and this was initiated at that point. Since that  time he really has not followed up with anybody from an orthopedic standpoint following the fasciotomy. Upon inspection today the patient does have a history of what appears to be diabetes with a hemoglobin A1c checked in November 2020 to be 8.0, hypertension, peripheral neuropathy, and peripheral vascular disease which seems to be recurrent due to thrombi. Unfortunately right now he tells me he is not taking any of his medicines as he has not been able to get a primary care provider but it looks as if he is also missed several appointments he was supposed to have with multiple providers including hematology. 05/12/2019 upon evaluation today patient appears to be doing better in regard to his wounds. He has been tolerating the dressing changes without complication. Fortunately there is no evidence of active infection at this time. No fevers, chills, nausea, vomiting, or diarrhea. We did get released to see him and continue to take over his care in regard to the wounds currently. The patient had to go to the ER in order to get his medications which included his anticoagulant. Fortunately he has established with a primary care provider now. 05/26/2019 upon evaluation today patient actually appears to be doing quite well with regard to his wounds. Fortunately there is no signs of active infection at this time. No fevers, chills, nausea, vomiting, or diarrhea. 07/08/2019 upon evaluation today patient appears to be doing little better in regard to his wounds compared to previous evaluations. Fortunately there is no signs of active infection. The wounds do seem to be making progress the medial more so than the lateral. Nonetheless he has been using Hydrofera Blue it has been several weeks in fact February 11 since have last seen him. Electronic Signature(s) Signed: 07/08/2019 12:53:00 PM By: Lenda KelpStone III, Tegan Britain PA-C Entered By: Lenda KelpStone III, Caylor Cerino on 07/08/2019 12:52:59 David Davies, David Davies  ([id number]) -------------------------------------------------------------------------------- Gaynelle AduHEM CAUT GRANULATION TISS Details Patient Name: David Davies, David Davies Date of Service: 07/08/2019 12:30 PM Medical Record Number: [id number] Patient Account Number: [id number] Date of Birth/Sex: 09-19-1980 (38 y.o. M) Treating RN: Rodell PernaScott, Dajea Primary Care Provider: PATIENT, NO Other Clinician: Referring Provider: Referral, Self Treating Provider/Extender: STONE III, Sayf Kerner Weeks in Treatment: 9 Procedure Performed for: Wound #1 Right,Medial Lower Leg Performed By: Physician Trellis PaganiniSTONE III, Rubyann Lingle E., PA-C Post Procedure Diagnosis Same as Pre-procedure Electronic Signature(s) Signed: 07/08/2019 3:34:12 PM By: Rodell PernaScott, Dajea Entered By: Rodell PernaScott, Dajea on 07/08/2019 12:49:10 David Davies, David Davies ([id number]) -------------------------------------------------------------------------------- Gaynelle AduHEM CAUT GRANULATION TISS Details Patient Name: David Davies, David Davies Date of Service: 07/08/2019 12:30 PM Medical Record Number: [id number] Patient Account Number: [id number] Date of Birth/Sex: 09-19-1980 (38 y.o. M) Treating RN: Rodell PernaScott, Dajea Primary Care Provider: PATIENT, NO Other Clinician: Referring Provider: Referral, Self Treating Provider/Extender: STONE III, Pavlos Yon Weeks in Treatment: 9 Procedure Performed for: Wound #2 Right,Lateral Lower Leg Performed By: Physician Trellis PaganiniSTONE III, Selena Swaminathan E., PA-C Post Procedure Diagnosis Same as Pre-procedure Electronic Signature(s) Signed: 07/08/2019 3:34:12 PM By: Rodell PernaScott, Dajea Entered By: Rodell PernaScott, Dajea on 07/08/2019 12:49:20 David Davies, David Davies ([id number]) -------------------------------------------------------------------------------- Physical Exam Details Patient Name: David Davies, David Davies Date of Service: 07/08/2019 12:30 PM Medical Record Number: [id number] Patient Account Number: [id number] Date of Birth/Sex: 09-19-1980 (38 y.o. M) Treating RN: Rodell PernaScott, Dajea Primary Care Provider: PATIENT, NO Other  Clinician: Referring Provider: Referral, Self Treating Provider/Extender: STONE III, Jahan Friedlander Weeks in Treatment: 9 Constitutional Well-nourished and well-hydrated in no acute distress. Respiratory normal breathing without difficulty. Psychiatric this patient is  able to make decisions and demonstrates good insight into disease process. Alert and Oriented x 3. pleasant and cooperative. Notes Patient's wound bed currently showed signs of hyper granular tissue noted I did have to perform chemical cauterization with silver nitrate after obtaining verbal consent from the patient he tolerated that without complication the entire wound surface of both locations was treated today. Electronic Signature(s) Signed: 07/08/2019 12:53:17 PM By: Lenda Kelp PA-C Entered By: Lenda Kelp on 07/08/2019 12:53:16 David Davies ([id number]) -------------------------------------------------------------------------------- Physician Orders Details Patient Name: David Davies Date of Service: 07/08/2019 12:30 PM Medical Record Number: [id number] Patient Account Number: [id number] Date of Birth/Sex: July 30, 1980 (38 y.o. M) Treating RN: Rodell Perna Primary Care Provider: PATIENT, NO Other Clinician: Referring Provider: Referral, Self Treating Provider/Extender: STONE III, Shanaya Schneck Weeks in Treatment: 9 Verbal / Phone Orders: No Diagnosis Coding ICD-10 Coding Code Description T81.31XA Disruption of external operation (surgical) wound, not elsewhere classified, initial encounter L98.492 Non-pressure chronic ulcer of skin of other sites with fat layer exposed I73.89 Other specified peripheral vascular diseases E11.622 Type 2 diabetes mellitus with other skin ulcer Z79.01 Long term (current) use of anticoagulants I10 Essential (primary) hypertension Wound Cleansing Wound #1 Right,Medial Lower Leg o Clean wound with Normal Saline. - in office o Cleanse wound with mild soap and water Wound #2 Right,Lateral  Lower Leg o Clean wound with Normal Saline. - in office o Cleanse wound with mild soap and water Primary Wound Dressing Wound #1 Right,Medial Lower Leg o Hydrafera Blue Ready Transfer Wound #2 Right,Lateral Lower Leg o Hydrafera Blue Ready Transfer Secondary Dressing Wound #1 Right,Medial Lower Leg o ABD and Kerlix/Conform Wound #2 Right,Lateral Lower Leg o ABD and Kerlix/Conform Dressing Change Frequency Wound #1 Right,Medial Lower Leg o Change dressing every other day. Wound #2 Right,Lateral Lower Leg o Change dressing every other day. Follow-up Appointments Wound #1 Right,Medial Lower Leg o Return Appointment in 1 week. Wound #2 Right,Lateral Lower Leg o Return Appointment in 1 week. Edema Control Wound #1 Right,Medial Lower Leg o Elevate legs to the level of the heart and pump ankles as often as possible o Other: - double layer tubi grip f Budnick, Keiron ([id number]) Wound #2 Right,Lateral Lower Leg o Elevate legs to the level of the heart and pump ankles as often as possible o Other: - double layer tubi grip f Electronic Signature(s) Signed: 07/08/2019 3:34:12 PM By: Rodell Perna Signed: 07/14/2019 5:29:46 PM By: Lenda Kelp PA-C Entered By: Rodell Perna on 07/08/2019 12:50:14 David Davies ([id number]) -------------------------------------------------------------------------------- Problem List Details Patient Name: David Davies Date of Service: 07/08/2019 12:30 PM Medical Record Number: [id number] Patient Account Number: [id number] Date of Birth/Sex: June 22, 1980 (38 y.o. M) Treating RN: Rodell Perna Primary Care Provider: PATIENT, NO Other Clinician: Referring Provider: Referral, Self Treating Provider/Extender: STONE III, Awesome Jared Weeks in Treatment: 9 Active Problems ICD-10 Evaluated Encounter Code Description Active Date Today Diagnosis T81.31XA Disruption of external operation (surgical) wound, not elsewhere classified, 05/06/2019 No  Yes initial encounter L98.492 Non-pressure chronic ulcer of skin of other sites with fat layer exposed 05/06/2019 No Yes I73.89 Other specified peripheral vascular diseases 05/06/2019 No Yes E11.622 Type 2 diabetes mellitus with other skin ulcer 05/06/2019 No Yes Z79.01 Long term (current) use of anticoagulants 05/06/2019 No Yes I10 Essential (primary) hypertension 05/06/2019 No Yes Inactive Problems Resolved Problems Electronic Signature(s) Signed: 07/08/2019 12:42:06 PM By: Lenda Kelp PA-C Entered By: Lenda Kelp on 07/08/2019 12:42:05 David Davies ([id number]) -------------------------------------------------------------------------------- Progress Note Details Patient Name: David Davies Date of  Service: 07/08/2019 12:30 PM Medical Record Number: [id number] Patient Account Number: [id number] Date of Birth/Sex: Jul 11, 1980 (39 y.o. M) Treating RN: Army Melia Primary Care Provider: PATIENT, NO Other Clinician: Referring Provider: Referral, Self Treating Provider/Extender: STONE III, Tyson Parkison Weeks in Treatment: 9 Subjective Chief Complaint Information obtained from Patient Right LE surgical ulcers History of Present Illness (HPI) 05/06/2019 patient presents today to our clinic with a fairly extensive history which I was able to retrieve in epic. Unfortunately it looks as though beginning on February 15, 2019 the patient did have an initial visit with a podiatrist secondary to what was diagnosed as a fungal foot infection. This was at [facility] he saw Quincy Carnes. I actually do not have access to that note incompletion that I can see. Nonetheless the patient subsequently 2 days later ended up going to the hospital through the emergency department where he was admitted at [facility] due to a right foot infection. Looking at the discharge summary he was admitted on 02/17/2019 and was not discharged until 03/05/2019. He has history upon admission was that he had been seen 2 days  prior to urgent care and had skin breakdown between his toes. He was given Augmentin and terbinafine at that time. He was instructed to try Epson salt baths but noted extreme worsening in the pain and went to the ED for further evaluation. He had no injury noted. 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 He is on Xarelto for prophylaxis due to thrombosis that he is previously had. 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. He has no history of diabetes. He also states that he quit smoking in 2017. During the hospital admission he denied any use of cocaine or marijuana though there was some question about this as it was reported in the chart. He also denied alcohol use. There was however some initial question as to whether or not his arterial flow was affected on the right. Subsequently he ended up being diagnosed with acute critical limb ischemia in the right lower extremity and had a distal embolectomy on 02/17/2019. This thrombus was in the right anterior tibial artery the patient subsequently ended up having a compartment syndrome and fasciotomy on 02/19/2019. They did end up repeating a CT angiogram with runoff to ensure everything was okay due to his severe pain fortunately everything was stable. 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 With that being said he tells me that he is out of his medication right now. It does not look like he has been going to his appointments as directed he has recently moved to [facility]. Subsequently the patient was reevaluated in December on the ninth of 2020. At this point he was again having issues with no evidence of DVT but that was the concern due to his extensive pain in the and they suggested that he follow-up with pain management and this was initiated at that point. Since that time he really has  not followed up with anybody from an orthopedic standpoint following the fasciotomy. Upon inspection today the patient does have a history of what appears to be diabetes with a hemoglobin A1c checked in November 2020 to be 8.0, hypertension, peripheral neuropathy, and peripheral vascular disease which seems to be recurrent due to thrombi. Unfortunately right now he tells me he is not taking any of his  medicines as he has not been able to get a primary care provider but it looks as if he is also missed several appointments he was supposed to have with multiple providers including hematology. 05/12/2019 upon evaluation today patient appears to be doing better in regard to his wounds. He has been tolerating the dressing changes without complication. Fortunately there is no evidence of active infection at this time. No fevers, chills, nausea, vomiting, or diarrhea. We did get released to see him and continue to take over his care in regard to the wounds currently. The patient had to go to the ER in order to get his medications which included his anticoagulant. Fortunately he has established with a primary care provider now. 05/26/2019 upon evaluation today patient actually appears to be doing quite well with regard to his wounds. Fortunately there is no signs of active infection at this time. No fevers, chills, nausea, vomiting, or diarrhea. 07/08/2019 upon evaluation today patient appears to be doing little better in regard to his wounds compared to previous evaluations. Fortunately there is no signs of active infection. The wounds do seem to be making progress the medial more so than the lateral. Nonetheless he has been using Hydrofera Blue it has been several weeks in fact February 11 since have last seen him. Objective Constitutional Well-nourished and well-hydrated in no acute distress. Vitals Time Taken: 12:30 PM, Height: 66 in, Weight: 199 lbs, BMI: 32.1, Temperature: 98.7 F, Pulse: 116 bpm,  Respiratory Rate: 16 breaths/min, Blood Pressure: 158/97 mmHg. [place], [place] ([id number]) Respiratory normal breathing without difficulty. Psychiatric this patient is able to make decisions and demonstrates good insight into disease process. Alert and Oriented x 3. pleasant and cooperative. General Notes: Patient's wound bed currently showed signs of hyper granular tissue noted I did have to perform chemical cauterization with silver nitrate after obtaining verbal consent from the patient he tolerated that without complication the entire wound surface of both locations was treated today. Integumentary (Hair, Skin) Wound #1 status is Open. Original cause of wound was Surgical Injury. The wound is located on the Right,Medial Lower Leg. The wound measures 8.5cm length x 1.2cm width x 0.1cm depth; 8.011cm^2 area and 0.801cm^3 volume. There is Fat Layer (Subcutaneous Tissue) Exposed exposed. There is a large amount of serosanguineous drainage noted. The wound margin is flat and intact. There is large (67-100%) red, hyper - granulation within the wound bed. There is no necrotic tissue within the wound bed. Wound #2 status is Open. Original cause of wound was Surgical Injury. The wound is located on the Right,Lateral Lower Leg. The wound measures 8.7cm length x 2.5cm width x 0.1cm depth; 17.082cm^2 area and 1.708cm^3 volume. There is Fat Layer (Subcutaneous Tissue) Exposed exposed. There is a medium amount of serosanguineous drainage noted. The wound margin is flat and intact. There is large (67-100%) red, hyper - granulation within the wound bed. There is no necrotic tissue within the wound bed. Assessment Active Problems ICD-10 Disruption of external operation (surgical) wound, not elsewhere classified, initial encounter Non-pressure chronic ulcer of skin of other sites with fat layer exposed Other specified peripheral vascular diseases Type 2 diabetes mellitus with other skin ulcer Long term  (current) use of anticoagulants Essential (primary) hypertension Procedures Wound #1 Pre-procedure diagnosis of Wound #1 is a Dehisced Wound located on the Right,Medial Lower Leg . An CHEM CAUT GRANULATION TISS procedure was performed by STONE III, Dorota Heinrichs E., PA-C. Post procedure Diagnosis Wound #1: Same as Pre-Procedure Wound #2 Pre-procedure diagnosis of Wound #  2 is a Dehisced Wound located on the Right,Lateral Lower Leg . An CHEM CAUT GRANULATION TISS procedure was performed by STONE III, Nydia Ytuarte E., PA-C. Post procedure Diagnosis Wound #2: Same as Pre-Procedure Plan Wound Cleansing: Wound #1 Right,Medial Lower Leg: Clean wound with Normal Saline. - in office Cleanse wound with mild soap and water Wound #2 Right,Lateral Lower Leg: Clean wound with Normal Saline. - in office Chamberlin, David Davies ([id number]) Cleanse wound with mild soap and water Primary Wound Dressing: Wound #1 Right,Medial Lower Leg: Hydrafera Blue Ready Transfer Wound #2 Right,Lateral Lower Leg: Hydrafera Blue Ready Transfer Secondary Dressing: Wound #1 Right,Medial Lower Leg: ABD and Kerlix/Conform Wound #2 Right,Lateral Lower Leg: ABD and Kerlix/Conform Dressing Change Frequency: Wound #1 Right,Medial Lower Leg: Change dressing every other day. Wound #2 Right,Lateral Lower Leg: Change dressing every other day. Follow-up Appointments: Wound #1 Right,Medial Lower Leg: Return Appointment in 1 week. Wound #2 Right,Lateral Lower Leg: Return Appointment in 1 week. Edema Control: Wound #1 Right,Medial Lower Leg: Elevate legs to the level of the heart and pump ankles as often as possible Other: - double layer tubi grip f Wound #2 Right,Lateral Lower Leg: Elevate legs to the level of the heart and pump ankles as often as possible Other: - double layer tubi grip f 1. I would recommend currently that we continue with the [facility] dressing I think that is a good option for him and it does seem to be doing well  and we order supplies at this point. 2. I am also can recommend at this time that we go ahead and continue with the Tubigrip as well as ABD pads over the [facility]. 3. I am also can recommend he continue to elevate his legs is much as possible. 4. He did inquire about physical therapy I explained that I do not believe therapy is going to anyway damage his healing process and in fact I think the motion and movement can actually help more effectively pump the fluid out of the leg subsequently leading to lower edema and increased and faster healing. He tells me however that the issue is that it causes him a lot of pain which I understand and again that is not something that I can really mitigate he does have a pain management specialist. Nonetheless I do not believe that is can be detrimental to his wounds in order to participate in physical therapy. We will see patient back for reevaluation in 1 week here in the clinic. If anything worsens or changes patient will contact our office for additional recommendations. Electronic Signature(s) Signed: 07/08/2019 12:54:21 PM By: Lenda Kelp PA-C Entered By: Lenda Kelp on 07/08/2019 12:54:20 David Davies ([id number]) -------------------------------------------------------------------------------- SuperBill Details Patient Name: David Davies Date of Service: 07/08/2019 Medical Record Number: [id number] Patient Account Number: [id number] Date of Birth/Sex: 1980-08-11 (38 y.o. M) Treating RN: Rodell Perna Primary Care Provider: PATIENT, NO Other Clinician: Referring Provider: Referral, Self Treating Provider/Extender: STONE III, Aliani Caccavale Weeks in Treatment: 9 Diagnosis Coding ICD-10 Codes Code Description T81.31XA Disruption of external operation (surgical) wound, not elsewhere classified, initial encounter L98.492 Non-pressure chronic ulcer of skin of other sites with fat layer exposed I73.89 Other specified peripheral vascular  diseases E11.622 Type 2 diabetes mellitus with other skin ulcer Z79.01 Long term (current) use of anticoagulants I10 Essential (primary) hypertension Facility Procedures CPT4 Code: [id number] Description: 17250 - CHEM CAUT GRANULATION TISS Modifier: Quantity: 2 CPT4 Code: Description: ICD-10 Diagnosis Description T81.31XA Disruption of external operation (surgical) wound, not elsewhere classified,  ini L98.492 Non-pressure chronic ulcer of skin of other sites with fat layer exposed Modifier: tial encounter Quantity: Physician Procedures CPT4 Code: 4562563 Description: 17250 - WC PHYS CHEM CAUT GRAN TISSUE Modifier: Quantity: 2 CPT4 Code: Description: ICD-10 Diagnosis Description T81.31XA Disruption of external operation (surgical) wound, not elsewhere classified, ini L98.492 Non-pressure chronic ulcer of skin of other sites with fat layer exposed Modifier: tial encounter Quantity: Electronic Signature(s) Signed: 07/08/2019 12:54:44 PM By: Lenda Kelp PA-C Entered By: Lenda Kelp on 07/08/2019 12:54:44

## 2019-07-08 NOTE — Progress Notes (Signed)
HICKS, FEICK ([id number]) Visit Report for 07/08/2019 Arrival Information Details Patient Name: David Davies, David Davies Date of Service: 07/08/2019 12:30 PM Medical Record Number: [id number] Patient Account Number: [id number] Date of Birth/Sex: 1981-02-24 (39 y.o. M) Treating RN: Rodell Perna Primary Care Deseree Zemaitis: PATIENT, NO Other Clinician: Referring Charnice Zwilling: Referral, Self Treating Kenroy Timberman/Extender: STONE III, HOYT Weeks in Treatment: 9 Visit Information History Since Last Visit Added or deleted any medications: No Patient Arrived: Ambulatory Any new allergies or adverse reactions: No Arrival Time: 12:32 Had a fall or experienced change in No Accompanied By: self activities of daily living that may affect Transfer Assistance: None risk of falls: Patient Identification Verified: Yes Signs or symptoms of abuse/neglect since last visito No Secondary Verification Process Completed: Yes Hospitalized since last visit: No Implantable device outside of the clinic excluding No cellular tissue based products placed in the center since last visit: Has Dressing in Place as Prescribed: Yes Pain Present Now: Yes Electronic Signature(s) Signed: 07/08/2019 4:08:02 PM By: Dayton Martes RCP, RRT, CHT Entered By: Dayton Martes on 07/08/2019 12:33:06 David Davies ([id number]) -------------------------------------------------------------------------------- Encounter Discharge Information Details Patient Name: David Davies Date of Service: 07/08/2019 12:30 PM Medical Record Number: [id number] Patient Account Number: [id number] Date of Birth/Sex: [birth year] (38 y.o. M) Treating RN: Rodell Perna Primary Care Yoceline Bazar: PATIENT, NO Other Clinician: Referring Asna Muldrow: Referral, Self Treating Lynda Capistran/Extender: STONE III, HOYT Weeks in Treatment: 9 Encounter Discharge Information Items Discharge Condition: Stable Ambulatory Status: Ambulatory Discharge Destination:  Home Transportation: Private Auto Accompanied By: self Schedule Follow-up Appointment: Yes Clinical Summary of Care: Electronic Signature(s) Signed: 07/08/2019 3:34:12 PM By: Rodell Perna Entered By: Rodell Perna on 07/08/2019 12:51:43 David Davies ([id number]) -------------------------------------------------------------------------------- Lower Extremity Assessment Details Patient Name: David Davies Date of Service: 07/08/2019 12:30 PM Medical Record Number: [id number] Patient Account Number: [id number] Date of Birth/Sex: 1981/03/20 (38 y.o. M) Treating RN: Rodell Perna Primary Care Ariston Grandison: PATIENT, NO Other Clinician: Referring Vishaal Strollo: Referral, Self Treating Annella Prowell/Extender: STONE III, HOYT Weeks in Treatment: 9 Edema Assessment Assessed: [Left: No] [Right: No] Edema: [Left: N] [Right: o] Calf Left: Right: Point of Measurement: 32 cm From Medial Instep cm 44 cm Ankle Left: Right: Point of Measurement: 11 cm From Medial Instep cm 24 cm Vascular Assessment Pulses: Dorsalis Pedis Palpable: [Right:Yes] Electronic Signature(s) Signed: 07/08/2019 3:34:12 PM By: Rodell Perna Entered By: Rodell Perna on 07/08/2019 12:39:05 David Davies ([id number]) -------------------------------------------------------------------------------- Multi Wound Chart Details Patient Name: David Davies Date of Service: 07/08/2019 12:30 PM Medical Record Number: [id number] Patient Account Number: [id number] Date of Birth/Sex: [birth year] (40 y.o. M) Treating RN: Rodell Perna Primary Care Yuriana Gaal: PATIENT, NO Other Clinician: Referring Aaralynn Shepheard: Referral, Self Treating Elisabetta Mishra/Extender: STONE III, HOYT Weeks in Treatment: 9 Vital Signs Height(in): 66 Pulse(bpm): 116 Weight(lbs): 199 Blood Pressure(mmHg): 158/97 Body Mass Index(BMI): 32 Temperature(F): 98.7 Respiratory Rate(breaths/min): 16 Photos: [N/A:N/A] Wound Location: Right Lower Leg - Medial Right Lower Leg - Lateral  N/A Wounding Event: Surgical Injury Surgical Injury N/A Primary Etiology: Dehisced Wound Dehisced Wound N/A Comorbid History: Hypertension, Neuropathy Hypertension, Neuropathy N/A Date Acquired: 02/19/2019 02/19/2019 N/A Weeks of Treatment: 9 9 N/A Wound Status: Open Open N/A Measurements L x W x D (cm) 8.5x1.2x0.1 8.7x2.5x0.1 N/A Area (cm) : 8.011 17.082 N/A Volume (cm) : 0.801 1.708 N/A % Reduction in Area: 87.30% 52.20% N/A % Reduction in Volume: 87.30% 52.20% N/A Classification: Full Thickness Without Exposed Full Thickness Without Exposed N/A Support Structures Support Structures Exudate Amount: Large Medium N/A Exudate Type: Serosanguineous Serosanguineous N/A Exudate Color: red, brown red, brown N/A  Wound Margin: Flat and Intact Flat and Intact N/A Granulation Amount: Large (67-100%) Large (67-100%) N/A Granulation Quality: Red, Hyper-granulation Red, Hyper-granulation N/A Necrotic Amount: None Present (0%) None Present (0%) N/A Exposed Structures: Fat Layer (Subcutaneous Tissue) Fat Layer (Subcutaneous Tissue) N/A Exposed: Yes Exposed: Yes Fascia: No Fascia: No Tendon: No Tendon: No Muscle: No Muscle: No Joint: No Joint: No Bone: No Bone: No Epithelialization: Small (1-33%) None N/A Treatment Notes Electronic Signature(s) Signed: 07/08/2019 3:34:12 PM By: Rodell Perna Entered By: Rodell Perna on 07/08/2019 12:45:59 David Davies ([id number]) David Davies ([id number]) -------------------------------------------------------------------------------- Multi-Disciplinary Care Plan Details Patient Name: David Davies Date of Service: 07/08/2019 12:30 PM Medical Record Number: [id number] Patient Account Number: [id number] Date of Birth/Sex: 07/01/1980 (39 y.o. M) Treating RN: Rodell Perna Primary Care Mithra Spano: PATIENT, NO Other Clinician: Referring Stephani Janak: Referral, Self Treating Malvika Tung/Extender: STONE III, HOYT Weeks in Treatment: 9 Active  Inactive Medication Nursing Diagnoses: Knowledge deficit related to medication safety: actual or potential Goals: Patient/caregiver will demonstrate understanding of all current medications Date Initiated: 05/05/2019 Target Resolution Date: 05/20/2019 Goal Status: Active Interventions: Assess patient/caregiver ability to manage medication regimen upon admission and as needed Notes: Orientation to the Wound Care Program Nursing Diagnoses: Knowledge deficit related to the wound healing center program Goals: Patient/caregiver will verbalize understanding of the Wound Healing Center Program Date Initiated: 05/05/2019 Target Resolution Date: 05/20/2019 Goal Status: Active Interventions: Provide education on orientation to the wound center Notes: Peripheral Neuropathy Nursing Diagnoses: Knowledge deficit related to disease process and management of peripheral neurovascular dysfunction Goals: Patient/caregiver will verbalize understanding of disease process and disease management Date Initiated: 05/05/2019 Target Resolution Date: 05/20/2019 Goal Status: Active Interventions: Assess signs and symptoms of neuropathy upon admission and as needed Notes: Wound/Skin Impairment Nursing Diagnoses: Impaired tissue integrity Bleiler, Legrand ([id number]) Goals: Ulcer/skin breakdown will have a volume reduction of 30% by week 4 Date Initiated: 05/05/2019 Target Resolution Date: 05/26/2019 Goal Status: Active Interventions: Assess ulceration(s) every visit Notes: Electronic Signature(s) Signed: 07/08/2019 3:34:12 PM By: Rodell Perna Entered By: Rodell Perna on 07/08/2019 12:45:48 David Davies ([id number]) -------------------------------------------------------------------------------- Pain Assessment Details Patient Name: David Davies Date of Service: 07/08/2019 12:30 PM Medical Record Number: [id number] Patient Account Number: [id number] Date of Birth/Sex: [birth year] (39 y.o. M) Treating RN:  Rodell Perna Primary Care Beverlie Kurihara: PATIENT, NO Other Clinician: Referring Falecia Vannatter: Referral, Self Treating Glorine Hanratty/Extender: STONE III, HOYT Weeks in Treatment: 9 Active Problems Location of Pain Severity and Description of Pain Patient Has Paino No Site Locations Pain Management and Medication Current Pain Management: Electronic Signature(s) Signed: 07/08/2019 3:34:12 PM By: Rodell Perna Entered By: Rodell Perna on 07/08/2019 12:37:12 David Davies ([id number]) -------------------------------------------------------------------------------- Patient/Caregiver Education Details Patient Name: David Davies Date of Service: 07/08/2019 12:30 PM Medical Record Number: [id number] Patient Account Number: [id number] Date of Birth/Gender: [birth year] (38 y.o. M) Treating RN: Rodell Perna Primary Care Physician: PATIENT, NO Other Clinician: Referring Physician: Referral, Self Treating Physician/Extender: Linwood Dibbles, HOYT Weeks in Treatment: 9 Education Assessment Education Provided To: Patient Education Topics Provided Wound/Skin Impairment: Handouts: Caring for Your Ulcer Methods: Demonstration, Explain/Verbal Responses: State content correctly Electronic Signature(s) Signed: 07/08/2019 3:34:12 PM By: Rodell Perna Entered By: Rodell Perna on 07/08/2019 12:51:12 David Davies ([id number]) -------------------------------------------------------------------------------- Wound Assessment Details Patient Name: David Davies Date of Service: 07/08/2019 12:30 PM Medical Record Number: [id number] Patient Account Number: [id number] Date of Birth/Sex: Dec 28, 1980 (38 y.o. M) Treating RN: Rodell Perna Primary Care Micalah Cabezas: PATIENT, NO Other Clinician: Referring Oluwaseyi Tull: Referral, Self Treating Alara Daniel/Extender: STONE III, HOYT Weeks in Treatment: 9 Wound Status  Wound Number: 1 Primary Etiology: Dehisced Wound Wound Location: Right Lower Leg - Medial Wound Status: Open Wounding Event:  Surgical Injury Comorbid History: Hypertension, Neuropathy Date Acquired: 02/19/2019 Weeks Of Treatment: 9 Clustered Wound: No Photos Wound Measurements Length: (cm) 8.5 Width: (cm) 1.2 Depth: (cm) 0.1 Area: (cm) 8.011 Volume: (cm) 0.801 % Reduction in Area: 87.3% % Reduction in Volume: 87.3% Epithelialization: Small (1-33%) Wound Description Classification: Full Thickness Without Exposed Support Structu Wound Margin: Flat and Intact Exudate Amount: Large Exudate Type: Serosanguineous Exudate Color: red, brown res Slough/Fibrino Yes Wound Bed Granulation Amount: Large (67-100%) Exposed Structure Granulation Quality: Red, Hyper-granulation Fascia Exposed: No Necrotic Amount: None Present (0%) Fat Layer (Subcutaneous Tissue) Exposed: Yes Tendon Exposed: No Muscle Exposed: No Joint Exposed: No Bone Exposed: No Treatment Notes Wound #1 (Right, Medial Lower Leg) Notes Hblue, ABD, conform, tubi F Electronic Signature(s) Signed: 07/08/2019 3:34:12 PM By: Douglass Rivers, Ruby Cola ([id number]) Entered By: Army Melia on 07/08/2019 12:37:50 [name] ([id number]) -------------------------------------------------------------------------------- Wound Assessment Details Patient Name: [name] Date of Service: 07/08/2019 12:30 PM Medical Record Number: [id number] Patient Account Number: [id number] Date of Birth/Sex: 1980-08-04 (39 y.o. M) Treating RN: Army Melia Primary Care Stesha Neyens: PATIENT, NO Other Clinician: Referring Anjel Perfetti: Referral, Self Treating Jayshaun Phillips/Extender: STONE III, HOYT Weeks in Treatment: 9 Wound Status Wound Number: 2 Primary Etiology: Dehisced Wound Wound Location: Right Lower Leg - Lateral Wound Status: Open Wounding Event: Surgical Injury Comorbid History: Hypertension, Neuropathy Date Acquired: 02/19/2019 Weeks Of Treatment: 9 Clustered Wound: No Photos Wound Measurements Length: (cm) 8.7 Width: (cm) 2.5 Depth: (cm) 0.1 Area:  (cm) 17.082 Volume: (cm) 1.708 % Reduction in Area: 52.2% % Reduction in Volume: 52.2% Epithelialization: None Wound Description Classification: Full Thickness Without Exposed Support Structures Wound Margin: Flat and Intact Exudate Amount: Medium Exudate Type: Serosanguineous Exudate Color: red, brown Foul Odor After Cleansing: No Slough/Fibrino No Wound Bed Granulation Amount: Large (67-100%) Exposed Structure Granulation Quality: Red, Hyper-granulation Fascia Exposed: No Necrotic Amount: None Present (0%) Fat Layer (Subcutaneous Tissue) Exposed: Yes Tendon Exposed: No Muscle Exposed: No Joint Exposed: No Bone Exposed: No Treatment Notes Wound #2 (Right, Lateral Lower Leg) Notes Hblue, ABD, conform, tubi F Electronic Signature(s) Signed: 07/08/2019 3:34:12 PM By: Douglass Rivers, Ruby Cola ([id number]) Entered By: Army Melia on 07/08/2019 12:38:06 [name] ([id number]) -------------------------------------------------------------------------------- Vitals Details Patient Name: [name] Date of Service: 07/08/2019 12:30 PM Medical Record Number: [id number] Patient Account Number: [id number] Date of Birth/Sex: Feb 16, 1981 (39 y.o. M) Treating RN: Army Melia Primary Care Keiri Solano: PATIENT, NO Other Clinician: Referring Pricilla Moehle: Referral, Self Treating Deziray Nabi/Extender: STONE III, HOYT Weeks in Treatment: 9 Vital Signs Time Taken: 12:30 Temperature (F): 98.7 Height (in): 66 Pulse (bpm): 116 Weight (lbs): 199 Respiratory Rate (breaths/min): 16 Body Mass Index (BMI): 32.1 Blood Pressure (mmHg): 158/97 Reference Range: 80 - 120 mg / dl Electronic Signature(s) Signed: 07/08/2019 4:08:02 PM By: Lorine Bears RCP, RRT, CHT Entered By: Lorine Bears on 07/08/2019 12:33:29

## 2019-07-15 ENCOUNTER — Ambulatory Visit: Payer: Medicare HMO | Admitting: Physician Assistant

## 2019-08-05 ENCOUNTER — Other Ambulatory Visit: Payer: Self-pay

## 2019-08-05 ENCOUNTER — Encounter: Payer: Medicare HMO | Attending: Internal Medicine | Admitting: Internal Medicine

## 2019-08-05 DIAGNOSIS — L98492 Non-pressure chronic ulcer of skin of other sites with fat layer exposed: Secondary | ICD-10-CM | POA: Diagnosis not present

## 2019-08-05 DIAGNOSIS — Z87891 Personal history of nicotine dependence: Secondary | ICD-10-CM | POA: Insufficient documentation

## 2019-08-05 DIAGNOSIS — Z7901 Long term (current) use of anticoagulants: Secondary | ICD-10-CM | POA: Diagnosis not present

## 2019-08-05 DIAGNOSIS — E1151 Type 2 diabetes mellitus with diabetic peripheral angiopathy without gangrene: Secondary | ICD-10-CM | POA: Insufficient documentation

## 2019-08-05 DIAGNOSIS — Z86718 Personal history of other venous thrombosis and embolism: Secondary | ICD-10-CM | POA: Diagnosis not present

## 2019-08-05 DIAGNOSIS — E1142 Type 2 diabetes mellitus with diabetic polyneuropathy: Secondary | ICD-10-CM | POA: Diagnosis not present

## 2019-08-05 DIAGNOSIS — I10 Essential (primary) hypertension: Secondary | ICD-10-CM | POA: Diagnosis not present

## 2019-08-05 DIAGNOSIS — E11622 Type 2 diabetes mellitus with other skin ulcer: Secondary | ICD-10-CM | POA: Insufficient documentation

## 2019-08-05 NOTE — Progress Notes (Addendum)
David Davies, David Davies ([id number]) Visit Report for 08/05/2019 Arrival Information Details Patient Name: David Davies, David Davies Date of Service: 08/05/2019 8:15 AM Medical Record Number: [id number] Patient Account Number: [id number] Date of Birth/Sex: [birth year] (39 y.o. M) Treating RN: David Davies Primary Care Kesleigh Morson: PATIENT, NO Other Clinician: Referring Darrall Strey: Daryel November Treating Caeli Linehan/Extender: Altamese Reserve in Treatment: 13 Visit Information History Since Last Visit Pain Present Now: Yes Patient Arrived: Ambulatory Arrival Time: 08:21 Accompanied By: self Transfer Assistance: None Patient Identification Verified: Yes Secondary Verification Process Completed: Yes Electronic Signature(s) Signed: 08/05/2019 12:31:33 PM By: Elliot Gurney, BSN, RN, CWS, Kim RN, BSN Entered By: Elliot Gurney, BSN, RN, CWS, Kim on 08/05/2019 08:22:28 David Davies ([id number]) -------------------------------------------------------------------------------- Clinic Level of Care Assessment Details Patient Name: David Davies Date of Service: 08/05/2019 8:15 AM Medical Record Number: [id number] Patient Account Number: [id number] Date of Birth/Sex: 1980-05-19 (39 y.o. M) Treating RN: Curtis Sites Primary Care Anniston Nellums: PATIENT, NO Other Clinician: Referring Gaston Dase: Daryel November Treating Nandan Willems/Extender: Altamese Lyndon in Treatment: 13 Clinic Level of Care Assessment Items TOOL 4 Quantity Score []  - Use when only an EandM is performed on FOLLOW-UP visit 0 ASSESSMENTS - Nursing Assessment / Reassessment X - Reassessment of Co-morbidities (includes updates in patient status) 1 10 X- 1 5 Reassessment of Adherence to Treatment Plan ASSESSMENTS - Wound and Skin Assessment / Reassessment []  - Simple Wound Assessment / Reassessment - one wound 0 X- 2 5 Complex Wound Assessment / Reassessment - multiple wounds []  - 0 Dermatologic / Skin Assessment (not related to wound area) ASSESSMENTS -  Focused Assessment X - Circumferential Edema Measurements - multi extremities 1 5 []  - 0 Nutritional Assessment / Counseling / Intervention X- 1 5 Lower Extremity Assessment (monofilament, tuning fork, pulses) []  - 0 Peripheral Arterial Disease Assessment (using hand held doppler) ASSESSMENTS - Ostomy and/or Continence Assessment and Care []  - Incontinence Assessment and Management 0 []  - 0 Ostomy Care Assessment and Management (repouching, etc.) PROCESS - Coordination of Care X - Simple Patient / Family Education for ongoing care 1 15 []  - 0 Complex (extensive) Patient / Family Education for ongoing care X- 1 10 Staff obtains , Records, Test Results / Process Orders []  - 0 Staff telephones HHA, Nursing Homes / Clarify orders / etc []  - 0 Routine Transfer to another Facility (non-emergent condition) []  - 0 Routine Hospital Admission (non-emergent condition) []  - 0 New Admissions / / Ordering NPWT, Apligraf, etc. []  - 0 Emergency Hospital Admission (emergent condition) X- 1 10 Simple Discharge Coordination []  - 0 Complex (extensive) Discharge Coordination PROCESS - Special Needs []  - Pediatric / Minor Patient Management 0 []  - 0 Isolation Patient Management []  - 0 Hearing / Language / Visual special needs []  - 0 Assessment of Community assistance (transportation, D/C planning, etc.) []  - 0 Additional assistance / Altered mentation []  - 0 Support Surface(s) Assessment (bed, cushion, seat, etc.) INTERVENTIONS - Wound Cleansing / Measurement Davies, David ( ) []  - 0 Simple Wound Cleansing - one wound X- 2 5 Complex Wound Cleansing - multiple wounds X- 1 5 Wound Imaging (photographs - any number of wounds) []  - 0 Wound Tracing (instead of photographs) []  - 0 Simple Wound Measurement - one wound X- 2 5 Complex Wound Measurement - multiple wounds INTERVENTIONS - Wound Dressings []  - Small Wound Dressing one or multiple  wounds 0 []  - 0 Medium Wound Dressing one or multiple wounds X- 1 20 Large Wound Dressing one or multiple wounds []  - 0  Application of Medications - topical []  - 0 Application of Medications - injection INTERVENTIONS - Miscellaneous []  - External ear exam 0 []  - 0 Specimen Collection (cultures, biopsies, blood, body fluids, etc.) []  - 0 Specimen(s) / Culture(s) sent or taken to Lab for analysis []  - 0 Patient Transfer (multiple staff / / Similar devices) []  - 0 Simple Staple / Suture removal (25 or less) []  - 0 Complex Staple / Suture removal (26 or more) []  - 0 Hypo / Hyperglycemic Management (close monitor of Blood Glucose) []  - 0 Ankle / Brachial Index (ABI) - do not check if billed separately X- 1 5 Vital Signs Has the patient been seen at the hospital within the last three years: Yes Total Score: 120 Level Of Care: New/Established - Level 4 Electronic Signature(s) Signed: 08/05/2019 12:04:24 PM By: Entered By: on 08/05/2019 08:42:15 Nurse, adult ( ) -------------------------------------------------------------------------------- Encounter Discharge Information Details Patient Name: Date of Service: 08/05/2019 8:15 AM Medical Record Number: Patient Account Number: 08/07/2019 Date of Birth/Sex: 28-Apr-1980 (39 y.o. M) Treating RN: 08/07/2019 Primary Care Aurther Harlin: PATIENT, NO Other Clinician: Referring Talasia Saulter: David Davies Treating [place] Lesure/Extender: [id number] in Treatment: 13 Encounter Discharge Information Items Discharge Condition: Stable Ambulatory Status: Ambulatory Discharge Destination: Home Transportation: Private Auto Accompanied By: self Schedule Follow-up Appointment: Yes Clinical Summary of Care: Electronic Signature(s) Signed: 08/05/2019 12:04:24 PM By: 08/07/2019 Entered By: [id number] on 08/05/2019 08:44:06 10/02/1980  (05-05-2005) -------------------------------------------------------------------------------- Lower Extremity Assessment Details Patient Name: Curtis Sites Date of Service: 08/05/2019 8:15 AM Medical Record Number: Altamese Stow Patient Account Number: 08/07/2019 Date of Birth/Sex: Oct 07, 1980 (38 y.o. M) Treating RN: 08/07/2019 Primary Care Tryniti Laatsch: PATIENT, NO Other Clinician: Referring Larine Fielding: David Davies Treating Rai Severns/Extender: [id number] in Treatment: 13 Edema Assessment Assessed: [Left: No] [Right: No] [Left: Edema] [Right: :] Calf Left: Right: Point of Measurement: 32 cm From Medial Instep cm 44 cm Ankle Left: Right: Point of Measurement: 11 cm From Medial Instep cm 26 cm Vascular Assessment Pulses: Dorsalis Pedis Palpable: [Right:Yes] Notes LEFT bka Electronic Signature(s) Signed: 08/05/2019 12:31:33 PM By: 08/07/2019, BSN, RN, CWS, Kim RN, BSN Entered By: [id number], BSN, RN, CWS, Kim on 08/05/2019 08:31:44 10/02/1980 (05-05-2005) -------------------------------------------------------------------------------- Multi Wound Chart Details Patient Name: David Davies Date of Service: 08/05/2019 8:15 AM Medical Record Number: Altamese Langley Patient Account Number: 08/07/2019 Date of Birth/Sex: Nov 06, 1980 (38 y.o. M) Treating RN: Elliot Gurney Primary Care Stefan Karen: PATIENT, NO Other Clinician: Referring Kirstyn Lean: 08/07/2019 Treating Terelle Dobler/Extender: David Davies in Treatment: 13 Vital Signs Height(in): 66 Pulse(bpm): 113 Weight(lbs): 199 Blood Pressure(mmHg): 156/108 Body Mass Index(BMI): 32 Temperature(F): 99.2 Respiratory Rate(breaths/min): 16 Photos: [N/A:N/A] Wound Location: Right, Medial Lower Leg Right, Lateral Lower Leg N/A Wounding Event: Surgical Injury Surgical Injury N/A Primary Etiology: Dehisced Wound Dehisced Wound N/A Comorbid History: Hypertension, Neuropathy Hypertension, Neuropathy N/A Date Acquired: 02/19/2019  02/19/2019 N/A Weeks of Treatment: 13 13 N/A Wound Status: Open Open N/A Measurements L x W x D (cm) 3x0.5x0.1 10.5x0.7x0.1 N/A Area (cm) : 1.178 5.773 N/A Volume (cm) : 0.118 0.577 N/A % Reduction in Area: 98.10% 83.80% N/A % Reduction in Volume: 98.10% 83.90% N/A Classification: Full Thickness Without Exposed Full Thickness Without Exposed N/A Support Structures Support Structures Exudate Amount: Small Medium N/A Exudate Type: Serosanguineous Serosanguineous N/A Exudate Color: red, brown red, brown N/A Wound Margin: Flat and Intact Flat and Intact N/A Granulation Amount: Large (67-100%) Large (67-100%) N/A Granulation Quality: Red, Hyper-granulation Red,  Hyper-granulation N/A Necrotic Amount: Small (1-33%) Small (1-33%) N/A Exposed Structures: Fat Layer (Subcutaneous Tissue) Fat Layer (Subcutaneous Tissue) N/A Exposed: Yes Exposed: Yes Fascia: No Fascia: No Tendon: No Tendon: No Muscle: No Muscle: No Joint: No Joint: No Bone: No Bone: No Epithelialization: Medium (34-66%) None N/A Treatment Notes Wound #1 (Right, Medial Lower Leg) Notes Hblue, ABD, conform, netting Wound #2 (Right, Lateral Lower Leg) Buckman, Armel ([id number]) Notes Hblue, ABD, conform, netting Electronic Signature(s) Signed: 08/05/2019 11:57:48 AM By: Linton Ham MD Entered By: Linton Ham on 08/05/2019 08:46:17 David Davies ([id number]) -------------------------------------------------------------------------------- Multi-Disciplinary Care Plan Details Patient Name: David Davies Date of Service: 08/05/2019 8:15 AM Medical Record Number: [id number] Patient Account Number: [id number] Date of Birth/Sex: 01/31/1981 (39 y.o. M) Treating RN: Montey Hora Primary Care Deannie Resetar: PATIENT, NO Other Clinician: Referring Larisa Lanius: Lenise Arena Treating Glyn Zendejas/Extender: Tito Dine in Treatment: 13 Active Inactive Electronic Signature(s) Signed: 08/23/2019 10:30:38 AM By:  Gretta Cool, BSN, RN, CWS, Kim RN, BSN Signed: 10/07/2019 10:49:50 AM By: Montey Hora Previous Signature: 08/05/2019 12:04:24 PM Version By: Montey Hora Entered By: Gretta Cool BSN, RN, CWS, Kim on 08/23/2019 10:30:37 David Davies ([id number]) -------------------------------------------------------------------------------- Pain Assessment Details Patient Name: David Davies Date of Service: 08/05/2019 8:15 AM Medical Record Number: [id number] Patient Account Number: [id number] Date of Birth/Sex: 03-01-1981 (39 y.o. M) Treating RN: Cornell Barman Primary Care Charliegh Vasudevan: PATIENT, NO Other Clinician: Referring Adolfo Granieri: Lenise Arena Treating Pearlena Ow/Extender: Tito Dine in Treatment: 13 Active Problems Location of Pain Severity and Description of Pain Patient Has Paino Yes Site Locations Pain Location: Generalized Pain Rate the pain. Current Pain Level: 7 Pain Management and Medication Current Pain Management: Notes hip pain Electronic Signature(s) Signed: 08/05/2019 12:31:33 PM By: Gretta Cool, BSN, RN, CWS, Kim RN, BSN Entered By: Gretta Cool, BSN, RN, CWS, Kim on 08/05/2019 08:24:53 David Davies ([id number]) -------------------------------------------------------------------------------- Patient/Caregiver Education Details Patient Name: David Davies Date of Service: 08/05/2019 8:15 AM Medical Record Number: [id number] Patient Account Number: [id number] Date of Birth/Gender: 1980-07-27 (38 y.o. M) Treating RN: Montey Hora Primary Care Physician: PATIENT, NO Other Clinician: Referring Physician: Lenise Arena Treating Physician/Extender: Tito Dine in Treatment: 13 Education Assessment Education Provided To: Patient Education Topics Provided Wound/Skin Impairment: Handouts: Other: wound care as ordered Methods: Demonstration, Explain/Verbal Responses: State content correctly Electronic Signature(s) Signed: 08/05/2019 12:04:24 PM By: Montey Hora Entered By: Montey Hora on 08/05/2019 08:42:39 David Davies ([id number]) -------------------------------------------------------------------------------- Wound Assessment Details Patient Name: David Davies Date of Service: 08/05/2019 8:15 AM Medical Record Number: [id number] Patient Account Number: [id number] Date of Birth/Sex: January 05, 1981 (38 y.o. M) Treating RN: Cornell Barman Primary Care Somer Trotter: PATIENT, NO Other Clinician: Referring Gretel Cantu: Lenise Arena Treating [employer] Teegarden/Extender: Tito Dine in Treatment: 13 Wound Status Wound Number: 1 Primary Etiology: Dehisced Wound Wound Location: Right, Medial Lower Leg Wound Status: Open Wounding Event: Surgical Injury Comorbid History: Hypertension, Neuropathy Date Acquired: 02/19/2019 Weeks Of Treatment: 13 Clustered Wound: No Photos Wound Measurements Length: (cm) 3 Width: (cm) 0.5 Depth: (cm) 0.1 Area: (cm) 1.178 Volume: (cm) 0.118 % Reduction in Area: 98.1% % Reduction in Volume: 98.1% Epithelialization: Medium (34-66%) Tunneling: No Undermining: No Wound Description Classification: Full Thickness Without Exposed Support Structu Wound Margin: Flat and Intact Exudate Amount: Small Exudate Type: Serosanguineous Exudate Color: red, brown res Slough/Fibrino Yes Wound Bed Granulation Amount: Large (67-100%) Exposed Structure Granulation Quality: Red, Hyper-granulation Fascia Exposed: No Necrotic Amount: Small (1-33%) Fat Layer (Subcutaneous Tissue) Exposed: Yes Necrotic Quality: Adherent Slough Tendon Exposed: No Muscle Exposed: No Joint Exposed: No Bone  Exposed: No Electronic Signature(s) Signed: 08/05/2019 12:31:33 PM By: Elliot Gurney, BSN, RN, CWS, Kim RN, BSN Entered By: Elliot Gurney, BSN, RN, CWS, Kim on 08/05/2019 08:29:56 David Davies ([id number]) -------------------------------------------------------------------------------- Wound Assessment Details Patient Name: David Davies Date of  Service: 08/05/2019 8:15 AM Medical Record Number: [id number] Patient Account Number: [id number] Date of Birth/Sex: [birth year] (38 y.o. M) Treating RN: David Davies Primary Care Yerachmiel Spinney: PATIENT, NO Other Clinician: Referring Berle Fitz: Daryel November Treating Kieana Livesay/Extender: Altamese [place] in Treatment: 13 Wound Status Wound Number: 2 Primary Etiology: Dehisced Wound Wound Location: Right, Lateral Lower Leg Wound Status: Open Wounding Event: Surgical Injury Comorbid History: Hypertension, Neuropathy Date Acquired: 02/19/2019 Weeks Of Treatment: 13 Clustered Wound: No Photos Wound Measurements Length: (cm) 10.5 Width: (cm) 0.7 Depth: (cm) 0.1 Area: (cm) 5.773 Volume: (cm) 0.577 % Reduction in Area: 83.8% % Reduction in Volume: 83.9% Epithelialization: None Tunneling: No Undermining: No Wound Description Classification: Full Thickness Without Exposed Support Structures Wound Margin: Flat and Intact Exudate Amount: Medium Exudate Type: Serosanguineous Exudate Color: red, brown Foul Odor After Cleansing: No Slough/Fibrino Yes Wound Bed Granulation Amount: Large (67-100%) Exposed Structure Granulation Quality: Red, Hyper-granulation Fascia Exposed: No Necrotic Amount: Small (1-33%) Fat Layer (Subcutaneous Tissue) Exposed: Yes Necrotic Quality: Adherent Slough Tendon Exposed: No Muscle Exposed: No Joint Exposed: No Bone Exposed: No Electronic Signature(s) Signed: 08/05/2019 12:31:33 PM By: Elliot Gurney, BSN, RN, CWS, Kim RN, BSN Entered By: Elliot Gurney, BSN, RN, CWS, Kim on 08/05/2019 08:30:31 David Davies ([id number]) -------------------------------------------------------------------------------- Vitals Details Patient Name: David Davies Date of Service: 08/05/2019 8:15 AM Medical Record Number: [id number] Patient Account Number: [id number] Date of Birth/Sex: Mar 19, 1981 (38 y.o. M) Treating RN: David Davies Primary Care Amil Moseman: PATIENT, NO Other  Clinician: Referring Jaysiah Marchetta: Daryel November Treating Tarahji Ramthun/Extender: Altamese Cutler in Treatment: 13 Vital Signs Time Taken: 08:22 Temperature (F): 99.2 Height (in): 66 Pulse (bpm): 113 Weight (lbs): 199 Respiratory Rate (breaths/min): 16 Body Mass Index (BMI): 32.1 Blood Pressure (mmHg): 156/108 Reference Range: 80 - 120 mg / dl Electronic Signature(s) Signed: 08/05/2019 12:31:33 PM By: Elliot Gurney, BSN, RN, CWS, Kim RN, BSN Entered By: Elliot Gurney, BSN, RN, CWS, Kim on 08/05/2019 08:24:38

## 2019-08-05 NOTE — Progress Notes (Signed)
AJA, BOLANDER ([id number]) Visit Report for 08/05/2019 HPI Details Patient Name: David Davies, David Davies Date of Service: 08/05/2019 8:15 AM Medical Record Number: [id number] Patient Account Number: [id number] Date of Birth/Sex: December 31, 1980 (39 y.o. M) M) Treating RN: Montey Hora Primary Care Provider: PATIENT, NO Other Clinician: Referring Provider: Referral, Self Treating Provider/Extender: Tito Dine in Treatment: 13 History of Present Illness HPI Description: 05/06/2019 patient presents today to our clinic with a fairly extensive history which I was able to retrieve in epic. Unfortunately it looks as though beginning on February 15, 2019 the patient did have an initial visit with a podiatrist secondary to what was diagnosed as a fungal foot infection. This was at [facility] he saw Quincy Carnes. I actually do not have access to that note incompletion that I can see. Nonetheless the patient subsequently 2 days later ended up going to the hospital through the emergency department where he was admitted at [facility] due to a right foot infection. Looking at the discharge summary he was admitted on 02/17/2019 and was not discharged until 03/05/2019. He has history upon admission was that he had been seen 2 days prior to urgent care and had skin breakdown between his toes. He was given Augmentin and terbinafine at that time. He was instructed to try Epson salt baths but noted extreme worsening in the pain and went to the ED for further evaluation. He had no injury noted. 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 He is on Xarelto for prophylaxis due to thrombosis that he is previously had. 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 He has no history of diabetes. He also states that he quit smoking in 2017. During the hospital admission he denied any use of cocaine or marijuana though there was some  question about this as it was reported in the chart. He also denied alcohol use. There was however some initial question as to whether or not his arterial flow was affected on the right. Subsequently he ended up being diagnosed with acute critical limb ischemia in the right lower extremity and had a distal embolectomy on 02/17/2019. This thrombus was in the right anterior tibial artery the patient subsequently ended up having a compartment syndrome and fasciotomy on 02/19/2019. They did end up repeating a CT angiogram with runoff to ensure everything was okay due to his severe pain fortunately everything was stable. 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 With that being said he tells me that he is out of his medication right now. It does not look like he has been going to his appointments as directed he has recently moved to [facility]. Subsequently the patient was reevaluated in December on the ninth of 2020. At this point he was again having issues with no evidence of DVT but that was the concern due to his extensive pain in the and they suggested that he follow-up with pain management and this was initiated at that point. Since that time he really has not followed up with anybody from an orthopedic standpoint following the fasciotomy. Upon inspection today the patient does have a history of what appears to be diabetes with a hemoglobin A1c checked in November 2020 to be 8.0, hypertension, peripheral neuropathy, and peripheral vascular disease which seems to be recurrent due to thrombi. Unfortunately right now he tells me he is not  taking any of his medicines as he has not been able to get a primary care provider but it looks as if he is also missed several appointments he was supposed to have with multiple providers including hematology. 05/12/2019 upon evaluation today patient appears to be doing better in regard to his wounds. He  has been tolerating the dressing changes without complication. Fortunately there is no evidence of active infection at this time. No fevers, chills, nausea, vomiting, or diarrhea. We did get released to see him and continue to take over his care in regard to the wounds currently. The patient had to go to the ER in order to get his medications which included his anticoagulant. Fortunately he has established with a primary care provider now. 05/26/2019 upon evaluation today patient actually appears to be doing quite well with regard to his wounds. Fortunately there is no signs of active infection at this time. No fevers, chills, nausea, vomiting, or diarrhea. 07/08/2019 upon evaluation today patient appears to be doing little better in regard to his wounds compared to previous evaluations. Fortunately there is no signs of active infection. The wounds do seem to be making progress the medial more so than the lateral. Nonetheless he has been using Hydrofera Blue it has been several weeks in fact February 11 since have last seen him. 4/23; patient's wounds have contracted quite a bit. He says that the area laterally had closed last week but then is recently reopened. He still has a smaller open area on the medial leg wound. These were initially fasciotomy surgical incisions. He is using Hydrofera Blue. His attendance in the clinic has been sporadic. He arrived in clinic yesterday asking for a sent to order him product with opening seen but this was refused. He is also complaining of pain in his leg he feels like there is a deep pain. He saw his primary doctor ordered physical therapy Electronic Signature(s) Signed: 08/05/2019 11:57:48 AM By: Baltazar Najjar MD Entered By: Baltazar Najjar on 08/05/2019 08:47:42 David Davies ([id number]) -------------------------------------------------------------------------------- Physical Exam Details Patient Name: David Davies Date of Service: 08/05/2019 8:15  AM Medical Record Number: [id number] Patient Account Number: [id number] Date of Birth/Sex: 09-Nov-1980 (39 y.o. M) Treating RN: Curtis Sites Primary Care Provider: PATIENT, NO Other Clinician: Referring Provider: Referral, Self Treating Provider/Extender: Altamese [place] in Treatment: 13 Constitutional Patient is hypertensive.. Pulse regular and within target range for patient.Marland Kitchen Respirations regular, non-labored and within target range.. Temperature is normal and within the target range for the patient.Marland Kitchen appears in no distress. Cardiovascular Pedal pulses are robust. Edema in the right leg. It looks as though he had taped a dressing in place perhaps too tightly on the upper leg. This area is indented in a circular fashion. Integumentary (Hair, Skin) No erythema around the wounds no evidence of infection. Notes Wound exam; the patient's wounds actually look quite healthy. There is several open areas laterally which paradoxically of the areas he says was closed last week even these look healthy. Some slough washed off with saline and gauze. These did not require mechanical debridement. The area medially is even smaller. Wound surfaces actually look quite good Electronic Signature(s) Signed: 08/05/2019 11:57:48 AM By: Baltazar Najjar MD Entered By: Baltazar Najjar on 08/05/2019 08:50:27 David Davies ([id number]) -------------------------------------------------------------------------------- Physician Orders Details Patient Name: David Davies Date of Service: 08/05/2019 8:15 AM Medical Record Number: [id number] Patient Account Number: [id number] Date of Birth/Sex: 1980-10-02 (38 y.o. M) Treating RN: Curtis Sites Primary Care Provider: PATIENT, NO Other Clinician:  Referring Provider: Referral, Self Treating Provider/Extender: Altamese [place] in Treatment: 13 Verbal / Phone Orders: No Diagnosis Coding Wound Cleansing Wound #1 Right,Medial Lower Leg o Clean wound  with Normal Saline. - in office o Cleanse wound with mild soap and water Wound #2 Right,Lateral Lower Leg o Clean wound with Normal Saline. - in office o Cleanse wound with mild soap and water Primary Wound Dressing Wound #1 Right,Medial Lower Leg o Hydrafera Blue Ready Transfer Wound #2 Right,Lateral Lower Leg o Hydrafera Blue Ready Transfer Secondary Dressing Wound #1 Right,Medial Lower Leg o ABD and Kerlix/Conform - secure with netting Wound #2 Right,Lateral Lower Leg o ABD and Kerlix/Conform - secure with netting Dressing Change Frequency Wound #1 Right,Medial Lower Leg o Change dressing every other day. Wound #2 Right,Lateral Lower Leg o Change dressing every other day. Follow-up Appointments Wound #1 Right,Medial Lower Leg o Return Appointment in 1 week. Wound #2 Right,Lateral Lower Leg o Return Appointment in 1 week. Edema Control Wound #1 Right,Medial Lower Leg o Elevate legs to the level of the heart and pump ankles as often as possible Wound #2 Right,Lateral Lower Leg o Elevate legs to the level of the heart and pump ankles as often as possible Electronic Signature(s) Signed: 08/05/2019 11:57:48 AM By: Baltazar Najjar MD Signed: 08/05/2019 12:04:24 PM By: Curtis Sites Entered By: Curtis Sites on 08/05/2019 08:41:25 David Davies ([id number]) -------------------------------------------------------------------------------- Problem List Details Patient Name: David Davies Date of Service: 08/05/2019 8:15 AM Medical Record Number: [id number] Patient Account Number: [id number] Date of Birth/Sex: [birth year] (38 y.o. M) Treating RN: Curtis Sites Primary Care Provider: PATIENT, NO Other Clinician: Referring Provider: Referral, Self Treating Provider/Extender: Altamese [place] in Treatment: 13 Active Problems ICD-10 Evaluated Encounter Code Description Active Date Today Diagnosis T81.31XA Disruption of external operation  (surgical) wound, not elsewhere classified, 05/06/2019 No Yes initial encounter L98.492 Non-pressure chronic ulcer of skin of other sites with fat layer exposed 05/06/2019 No Yes I73.89 Other specified peripheral vascular diseases 05/06/2019 No Yes E11.622 Type 2 diabetes mellitus with other skin ulcer 05/06/2019 No Yes Z79.01 Long term (current) use of anticoagulants 05/06/2019 No Yes I10 Essential (primary) hypertension 05/06/2019 No Yes Inactive Problems Resolved Problems Electronic Signature(s) Signed: 08/05/2019 11:57:48 AM By: Baltazar Najjar MD Entered By: Baltazar Najjar on 08/05/2019 08:46:06 David Davies ([id number]) -------------------------------------------------------------------------------- Progress Note Details Patient Name: David Davies Date of Service: 08/05/2019 8:15 AM Medical Record Number: [id number] Patient Account Number: [id number] Date of Birth/Sex: October 19, 1980 (38 y.o. M) Treating RN: Curtis Sites Primary Care Provider: PATIENT, NO Other Clinician: Referring Provider: Referral, Self Treating Provider/Extender: Altamese  in Treatment: 13 Subjective History of Present Illness (HPI) 05/06/2019 patient presents today to our clinic with a fairly extensive history which I was able to retrieve in epic. Unfortunately it looks as though beginning on February 15, 2019 the patient did have an initial visit with a podiatrist secondary to what was diagnosed as a fungal foot infection. This was at [facility] he saw Reita Cliche. I actually do not have access to that note incompletion that I can see. Nonetheless the patient subsequently 2 days later ended up going to the hospital through the emergency department where he was admitted at [facility] due to a right foot infection. Looking at the discharge summary he was admitted on 02/17/2019 and was not discharged until 03/05/2019. He has history upon admission was that he had been seen 2 days prior to urgent care  and had skin breakdown between his toes. He  was given Augmentin and terbinafine at that time. He was instructed to try Epson salt baths but noted extreme worsening in the pain and went to the ED for further evaluation. He had no injury noted. 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 He is on Xarelto for prophylaxis due to thrombosis that he is previously had. 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 He has no history of diabetes. He also states that he quit smoking in 2017. During the hospital admission he denied any use of cocaine or marijuana though there was some question about this as it was reported in the chart. He also denied alcohol use. There was however some initial question as to whether or not his arterial flow was affected on the right. Subsequently he ended up being diagnosed with acute critical limb ischemia in the right lower extremity and had a distal embolectomy on 02/17/2019. This thrombus was in the right anterior tibial artery the patient subsequently ended up having a compartment syndrome and fasciotomy on 02/19/2019. They did end up repeating a CT angiogram with runoff to ensure everything was okay due to his severe pain fortunately everything was stable. 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 With that being said he tells me that he is out of his medication right now. It does not look like he has been going to his appointments as directed he has recently moved to [facility]. Subsequently the patient was reevaluated in December on the ninth of 2020. At this point he was again having issues with no evidence of DVT but that was the concern due to his extensive pain in the and they suggested that he follow-up with pain management and this was initiated at that point. Since that time he really has not followed up with  anybody from an orthopedic standpoint following the fasciotomy. Upon inspection today the patient does have a history of what appears to be diabetes with a hemoglobin A1c checked in November 2020 to be 8.0, hypertension, peripheral neuropathy, and peripheral vascular disease which seems to be recurrent due to thrombi. Unfortunately right now he tells me he is not taking any of his medicines as he has not been able to get a primary care provider but it looks as if he is also missed several appointments he was supposed to have with multiple providers including hematology. 05/12/2019 upon evaluation today patient appears to be doing better in regard to his wounds. He has been tolerating the dressing changes without complication. Fortunately there is no evidence of active infection at this time. No fevers, chills, nausea, vomiting, or diarrhea. We did get released to see him and continue to take over his care in regard to the wounds currently. The patient had to go to the ER in order to get his medications which included his anticoagulant. Fortunately he has established with a primary care provider now. 05/26/2019 upon evaluation today patient actually appears to be doing quite well with regard to his wounds. Fortunately there is no signs of active infection at this time. No fevers, chills, nausea, vomiting, or diarrhea. 07/08/2019 upon evaluation today patient appears to be doing little better in regard to his wounds compared to previous evaluations. Fortunately there is no signs of active infection. The wounds do seem to be making progress the medial more so than the lateral. Nonetheless he  has been using Hydrofera Blue it has been several weeks in fact February 11 since have last seen him. 4/23; patient's wounds have contracted quite a bit. He says that the area laterally had closed last week but then is recently reopened. He still has a smaller open area on the medial leg wound. These were initially  fasciotomy surgical incisions. He is using Hydrofera Blue. His attendance in the clinic has been sporadic. He arrived in clinic yesterday asking for a sent to order him product with opening seen but this was refused. He is also complaining of pain in his leg he feels like there is a deep pain. He saw his primary doctor ordered physical therapy Objective Constitutional Patient is hypertensive.. Pulse regular and within target range for patient.Marland Kitchen Respirations regular, non-labored and within target range.. Temperature is normal and within the target range for the patient.Marland Kitchen appears in no distress. Vitals Time Taken: 8:22 AM, Height: 66 in, Weight: 199 lbs, BMI: 32.1, Temperature: 99.2 F, Pulse: 113 bpm, Respiratory Rate: 16 breaths/min, Blood Pressure: 156/108 mmHg. SHAN, VALDES ([id number]) Cardiovascular Pedal pulses are robust. Edema in the right leg. It looks as though he had taped a dressing in place perhaps too tightly on the upper leg. This area is indented in a circular fashion. General Notes: Wound exam; the patient's wounds actually look quite healthy. There is several open areas laterally which paradoxically of the areas he says was closed last week even these look healthy. Some slough washed off with saline and gauze. These did not require mechanical debridement. The area medially is even smaller. Wound surfaces actually look quite good Integumentary (Hair, Skin) No erythema around the wounds no evidence of infection. Wound #1 status is Open. Original cause of wound was Surgical Injury. The wound is located on the Right,Medial Lower Leg. The wound measures 3cm length x 0.5cm width x 0.1cm depth; 1.178cm^2 area and 0.118cm^3 volume. There is Fat Layer (Subcutaneous Tissue) Exposed exposed. There is no tunneling or undermining noted. There is a small amount of serosanguineous drainage noted. The wound margin is flat and intact. There is large (67-100%) red, hyper - granulation within  the wound bed. There is a small (1-33%) amount of necrotic tissue within the wound bed including Adherent Slough. Wound #2 status is Open. Original cause of wound was Surgical Injury. The wound is located on the Right,Lateral Lower Leg. The wound measures 10.5cm length x 0.7cm width x 0.1cm depth; 5.773cm^2 area and 0.577cm^3 volume. There is Fat Layer (Subcutaneous Tissue) Exposed exposed. There is no tunneling or undermining noted. There is a medium amount of serosanguineous drainage noted. The wound margin is flat and intact. There is large (67-100%) red, hyper - granulation within the wound bed. There is a small (1-33%) amount of necrotic tissue within the wound bed including Adherent Slough. Assessment Active Problems ICD-10 Disruption of external operation (surgical) wound, not elsewhere classified, initial encounter Non-pressure chronic ulcer of skin of other sites with fat layer exposed Other specified peripheral vascular diseases Type 2 diabetes mellitus with other skin ulcer Long term (current) use of anticoagulants Essential (primary) hypertension Plan Wound Cleansing: Wound #1 Right,Medial Lower Leg: Clean wound with Normal Saline. - in office Cleanse wound with mild soap and water Wound #2 Right,Lateral Lower Leg: Clean wound with Normal Saline. - in office Cleanse wound with mild soap and water Primary Wound Dressing: Wound #1 Right,Medial Lower Leg: Hydrafera Blue Ready Transfer Wound #2 Right,Lateral Lower Leg: Hydrafera Blue Ready Transfer Secondary Dressing: Wound #1  Right,Medial Lower Leg: ABD and Kerlix/Conform - secure with netting Wound #2 Right,Lateral Lower Leg: ABD and Kerlix/Conform - secure with netting Dressing Change Frequency: Wound #1 Right,Medial Lower Leg: Change dressing every other day. Wound #2 Right,Lateral Lower Leg: Change dressing every other day. David Davies, David Davies ([id number]) Follow-up Appointments: Wound #1 Right,Medial Lower  Leg: Return Appointment in 1 week. Wound #2 Right,Lateral Lower Leg: Return Appointment in 1 week. Edema Control: Wound #1 Right,Medial Lower Leg: Elevate legs to the level of the heart and pump ankles as often as possible Wound #2 Right,Lateral Lower Leg: Elevate legs to the level of the heart and pump ankles as often as possible #1 the patient's wounds actually look quite healthy and in both areas look as though they are closing. Even the area he says is reopening laterally has epithelialization bridges of epithelialization. 2. I am quite confident that the edema in his ankle and edema he is complaining about in his leg was because of occlusive tightness of tapeo That he has been putting on the upper side of the calf just below the tibial tuberosity circumferentially. I do not believe he has a DVT 3. With regards to the pain he is describing I am uncertain about the etiology however the extent of the surgery and the damage he has had in his leg it would not be incompatible with a neuropathic type of pain. Certainly there is no evidence to believe that this is vascular Electronic Signature(s) Signed: 08/05/2019 11:57:48 AM By: Baltazar Najjarobson, Michael MD Entered By: Baltazar Najjarobson, Michael on 08/05/2019 08:52:08 David Davies, David Davies ([id number]) -------------------------------------------------------------------------------- SuperBill Details Patient Name: David Davies, David Davies Date of Service: 08/05/2019 Medical Record Number: [id number] Patient Account Number: [id number] Date of Birth/Sex: [birth year] (38 y.o. 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  exposed I73.89 Other specified peripheral vascular diseases E11.622 Type 2 diabetes mellitus with other skin ulcer Z79.01 Long term (current) use of anticoagulants I10 Essential (primary) hypertension Facility Procedures CPT4 Code: [id number] Description: 99214 - WOUND CARE VISIT-LEV 4 EST PT Modifier: Quantity: 1 Physician Procedures CPT4 Code: [id number]: [id number] Description: 99213 - WC PHYS LEVEL 3 - EST PT Modifier: Quantity: 1 CPT4 Code: Description: ICD-10 Diagnosis Description T81.31XA Disruption of external operation (surgical) wound, not elsewhere classified, ini L98.492 Non-pressure chronic ulcer of skin of other sites with fat layer exposed Modifier: tial encounter Quantity: Electronic Signature(s) Signed: 08/05/2019 11:57:48 AM By: Baltazar Najjarobson, Michael MD Entered By: Baltazar Najjarobson, Michael on 08/05/2019 08:52:37

## 2019-08-06 ENCOUNTER — Other Ambulatory Visit: Payer: Self-pay

## 2019-08-06 ENCOUNTER — Emergency Department: Payer: Medicare HMO

## 2019-08-06 ENCOUNTER — Emergency Department
Admission: EM | Admit: 2019-08-06 | Discharge: 2019-08-06 | Disposition: A | Discharge status: Home / Self Care | Payer: Medicare HMO | Attending: Emergency Medicine | Admitting: Emergency Medicine

## 2019-08-06 DIAGNOSIS — R42 Dizziness and giddiness: Secondary | ICD-10-CM | POA: Insufficient documentation

## 2019-08-06 DIAGNOSIS — Z86718 Personal history of other venous thrombosis and embolism: Secondary | ICD-10-CM | POA: Insufficient documentation

## 2019-08-06 DIAGNOSIS — I10 Essential (primary) hypertension: Secondary | ICD-10-CM | POA: Diagnosis not present

## 2019-08-06 DIAGNOSIS — W19XXXA Unspecified fall, initial encounter: Secondary | ICD-10-CM | POA: Insufficient documentation

## 2019-08-06 LAB — BASIC METABOLIC PANEL
Anion gap: 10 (ref 5–15)
BUN: 10 mg/dL (ref 6–20)
CO2: 28 mmol/L (ref 22–32)
Calcium: 9.4 mg/dL (ref 8.9–10.3)
Chloride: 100 mmol/L (ref 98–111)
Creatinine, Ser: 0.94 mg/dL (ref 0.61–1.24)
GFR calc Af Amer: >60 mL/min (ref >60)
GFR calc non Af Amer: >60 mL/min (ref >60)
Glucose, Bld: 139 mg/dL — ABNORMAL HIGH (ref 70–99)
Potassium: 4.3 mmol/L (ref 3.5–5.1)
Sodium: 138 mmol/L (ref 135–145)

## 2019-08-06 LAB — CBC
HCT: 34.4 % — ABNORMAL LOW (ref 39.0–52.0)
Hemoglobin: 11.6 g/dL — ABNORMAL LOW (ref 13.0–17.0)
MCH: 27.8 pg (ref 26.0–34.0)
MCHC: 33.7 g/dL (ref 30.0–36.0)
MCV: 82.3 fL (ref 80.0–100.0)
Platelets: 213 10*3/uL (ref 150–400)
RBC: 4.18 MIL/uL — ABNORMAL LOW (ref 4.22–5.81)
RDW: 18.3 % — ABNORMAL HIGH (ref 11.5–15.5)
WBC: 6.7 10*3/uL (ref 4.0–10.5)
nRBC: 0 % (ref 0.0–0.2)

## 2019-08-06 MED ORDER — SODIUM CHLORIDE 0.9% FLUSH: 3.0000 mL | Freq: Once | INTRAVENOUS | Status: DC

## 2019-08-06 MED ORDER — OXYCODONE-ACETAMINOPHEN 5-325 MG PO TABS
2.0000 | ORAL_TABLET | Freq: Once | ORAL | Status: AC
  Administered 2019-08-06: 2 via ORAL
  Filled 2019-08-06: qty 2

## 2019-08-06 NOTE — ED Notes (Signed)
Pt returned from CT at this time.  

## 2019-08-06 NOTE — ED Triage Notes (Addendum)
Pt comes via POV from home with c/o fall X2 and being dizzy. Pt states he fell Thursday night and that his leg just gave out on him.  Pt states he had another fall today after getting dizzy. Pt states he hit his head and now has a knot on the back of it.  Pt states he is on Eliquis.  Pt also states he had to have a clot cut out of his right leg back in November of last year. Pt states it then had opened up and pt is seeing wound clinic for that care.

## 2019-08-06 NOTE — ED Notes (Signed)
This RN and EDP to bedside to speak with patient, pt reports increasing HA at thist time to R side of his head. EDP explained will place orders for pain medication.

## 2019-08-06 NOTE — ED Notes (Signed)
NAD noted at this time. Pt can be heard on his cell phone speaking with family at this time.

## 2019-08-06 NOTE — ED Notes (Signed)
Pt transported to CT at this time.

## 2019-08-06 NOTE — ED Provider Notes (Signed)
[facility] Emergency Department Provider Note       Time seen: ----------------------------------------- 5:07 PM on 08/06/2019 -----------------------------------------   I have reviewed the triage vital signs and the nursing notes.  HISTORY   Chief Complaint Dizziness and Fall    HPI David Davies is a 39 y.o. male with a history of chronic leg and back pain, DVT, hypertension, paresthesias who presents to the ED for frequent falls and dizziness.  Patient states he fell Thursday night and his leg gave out on him.  He reports hitting the back of his head on the right side, currently taking Eliquis.  He is seeing the wound clinic for chronic pain.  Does not think he is taking too much medication.  Past Medical History:  Diagnosis Date  . Chronic back pain   . Chronic leg pain    left  . Chronic pain   . DVT (deep venous thrombosis) ([place])   . Hypertension   . Left leg paresthesias     There are no problems to display for this patient.   Past Surgical History:  Procedure Laterality Date  . FASCIOTOMY    . THROMBECTOMY      Allergies Patient has no known allergies.  Social History Social History   Tobacco Use  . Smoking status: Never Smoker  . Smokeless tobacco: Never Used  Substance Use Topics  . Alcohol use: No  . Drug use: No    Review of Systems Constitutional: Negative for fever. Cardiovascular: Negative for chest pain. Respiratory: Negative for shortness of breath. Gastrointestinal: Negative for abdominal pain, vomiting and diarrhea. Musculoskeletal: Positive for leg pain Skin: Positive for scalp hematoma Neurological: Positive for headache  All systems negative/normal/unremarkable except as stated in the HPI  ____________________________________________   PHYSICAL EXAM:  VITAL SIGNS: ED Triage Vitals [08/06/19 1635]  Enc Vitals Group     BP (!) 130/91     Pulse Rate 78     Resp 18     Temp 97.7 F (36.5 C)      Temp src      SpO2 100 %     Weight 198 lb (89.8 kg)     Height 5\' 5"  (1.651 m)     Head Circumference      Peak Flow      Pain Score 8     Pain Loc      Pain Edu?      Excl. in [place]?     Constitutional: Alert and oriented. Well appearing and in no distress. Eyes: Conjunctivae are normal. Normal extraocular movements. ENT      Head: Normocephalic, right posterior parietal scalp hematoma      Nose: No congestion/rhinnorhea.      Mouth/Throat: Mucous membranes are moist.      Neck: No stridor. Cardiovascular: Normal rate, regular rhythm. No murmurs, rubs, or gallops. Respiratory: Normal respiratory effort without tachypnea nor retractions. Breath sounds are clear and equal bilaterally. No wheezes/rales/rhonchi. Gastrointestinal: Soft and nontender. Normal bowel sounds Musculoskeletal: Left lower extremity prosthesis, right lower extremity wound with dressing in place Neurologic:  Normal speech and language. No gross focal neurologic deficits are appreciated.  Skin: Remote surgery on the right leg Psychiatric: Mood and affect are normal. Speech and behavior are normal.  ___________________________________________  ED COURSE:  As part of my medical decision making, I reviewed the following data within the [place] History obtained from family if available, nursing notes, old chart and ekg, as well as  notes from prior ED visits. Patient presented for a fall and dizziness, we will assess with labs and imaging as indicated at this time.   Procedures  Trinidad Petron was evaluated in Emergency Department on 08/06/2019 for the symptoms described in the history of present illness. He was evaluated in the context of the global COVID-19 pandemic, which necessitated consideration that the patient might be at risk for infection with the SARS-CoV-2 virus that causes COVID-19. Institutional protocols and algorithms that pertain to the evaluation of patients at risk for COVID-19 are  in a state of rapid change based on information released by regulatory bodies including the CDC and federal and state organizations. These policies and algorithms were followed during the patient's care in the ED.  ____________________________________________   LABS (pertinent positives/negatives)  Labs Reviewed  BASIC METABOLIC PANEL - Abnormal; Notable for the following components:      Result Value   Glucose, Bld 139 (*)    All other components within normal limits  CBC - Abnormal; Notable for the following components:   RBC 4.18 (*)    Hemoglobin 11.6 (*)    HCT 34.4 (*)    RDW 18.3 (*)    All other components within normal limits  URINALYSIS, COMPLETE (UACMP) WITH MICROSCOPIC  CBG MONITORING, ED    RADIOLOGY  CT head  IMPRESSION:  Unremarkable noncontrast head CT.  ____________________________________________   DIFFERENTIAL DIAGNOSIS   Fall, contusion, subdural hemorrhage, subarachnoid hemorrhage, medication side effect, dehydration, electrolyte abnormality  FINAL ASSESSMENT AND PLAN  Fall, head injury, dizziness   Plan: The patient had presented for dizziness. Patient's labs did not reveal any acute process. Patient's imaging was reassuring.  I feel strongly that his symptoms are likely medication related.  He is cleared for outpatient follow-up.   Ulice Dash, MD    Note: This note was generated in part or whole with voice recognition software. Voice recognition is usually quite accurate but there are transcription errors that can and very often do occur. I apologize for any typographical errors that were not detected and corrected.     Emily Filbert, MD 08/06/19 1900

## 2019-08-06 NOTE — ED Notes (Signed)
This RN to bedside, pt states was getting out of bed and his R leg "just had no strength", pt reports falling and hit the back of his head. Pt c/o HA 8/10 at this time. Pt with wound that is being followed by wound clinic at this time to R leg from thrombectomy, pt with noted L AKA. Pt alert and oriented at this time, speaking on phone when not interacting with staff.

## 2019-08-12 ENCOUNTER — Ambulatory Visit: Payer: Medicare HMO | Admitting: Physician Assistant

## 2019-08-26 ENCOUNTER — Other Ambulatory Visit: Payer: Self-pay

## 2019-08-26 ENCOUNTER — Encounter: Payer: Self-pay | Admitting: Emergency Medicine

## 2019-08-26 ENCOUNTER — Emergency Department
Admission: EM | Admit: 2019-08-26 | Discharge: 2019-08-26 | Disposition: A | Discharge status: Home / Self Care | Payer: Medicare Other | Attending: Emergency Medicine | Admitting: Emergency Medicine

## 2019-08-26 DIAGNOSIS — Z7982 Long term (current) use of aspirin: Secondary | ICD-10-CM | POA: Diagnosis not present

## 2019-08-26 DIAGNOSIS — W07XXXD Fall from chair, subsequent encounter: Secondary | ICD-10-CM | POA: Diagnosis not present

## 2019-08-26 DIAGNOSIS — I10 Essential (primary) hypertension: Secondary | ICD-10-CM | POA: Insufficient documentation

## 2019-08-26 DIAGNOSIS — Z7902 Long term (current) use of antithrombotics/antiplatelets: Secondary | ICD-10-CM | POA: Insufficient documentation

## 2019-08-26 DIAGNOSIS — Z7901 Long term (current) use of anticoagulants: Secondary | ICD-10-CM | POA: Diagnosis not present

## 2019-08-26 DIAGNOSIS — L089 Local infection of the skin and subcutaneous tissue, unspecified: Secondary | ICD-10-CM | POA: Diagnosis not present

## 2019-08-26 DIAGNOSIS — Z79899 Other long term (current) drug therapy: Secondary | ICD-10-CM | POA: Diagnosis not present

## 2019-08-26 DIAGNOSIS — S0003XD Contusion of scalp, subsequent encounter: Secondary | ICD-10-CM | POA: Diagnosis present

## 2019-08-26 DIAGNOSIS — S0000XA Unspecified superficial injury of scalp, initial encounter: Secondary | ICD-10-CM

## 2019-08-26 LAB — CBC
HCT: 38.5 % — ABNORMAL LOW (ref 39.0–52.0)
Hemoglobin: 12.8 g/dL — ABNORMAL LOW (ref 13.0–17.0)
MCH: 28.3 pg (ref 26.0–34.0)
MCHC: 33.2 g/dL (ref 30.0–36.0)
MCV: 85.2 fL (ref 80.0–100.0)
Platelets: 276 10*3/uL (ref 150–400)
RBC: 4.52 MIL/uL (ref 4.22–5.81)
RDW: 16.6 % — ABNORMAL HIGH (ref 11.5–15.5)
WBC: 5.3 10*3/uL (ref 4.0–10.5)
nRBC: 0 % (ref 0.0–0.2)

## 2019-08-26 LAB — BASIC METABOLIC PANEL
Anion gap: 9 (ref 5–15)
BUN: 7 mg/dL (ref 6–20)
CO2: 25 mmol/L (ref 22–32)
Calcium: 9.3 mg/dL (ref 8.9–10.3)
Chloride: 102 mmol/L (ref 98–111)
Creatinine, Ser: 0.71 mg/dL (ref 0.61–1.24)
GFR calc Af Amer: >60 mL/min (ref >60)
GFR calc non Af Amer: >60 mL/min (ref >60)
Glucose, Bld: 95 mg/dL (ref 70–99)
Potassium: 4 mmol/L (ref 3.5–5.1)
Sodium: 136 mmol/L (ref 135–145)

## 2019-08-26 MED ORDER — CEPHALEXIN 500 MG PO CAPS: 500.0000 mg | ORAL_CAPSULE | Freq: Four times a day (QID) | ORAL | 0 refills | Status: AC

## 2019-08-26 MED ORDER — ACETAMINOPHEN 500 MG PO TABS
1000.0000 mg | ORAL_TABLET | Freq: Once | ORAL | Status: AC
  Administered 2019-08-26: 1000 mg via ORAL
  Filled 2019-08-26: qty 2

## 2019-08-26 MED ORDER — SODIUM CHLORIDE 0.9 % IV SOLN
1.0000 g | Freq: Once | INTRAVENOUS | Status: AC
  Administered 2019-08-26: 1 g via INTRAVENOUS
  Filled 2019-08-26: qty 10

## 2019-08-26 NOTE — ED Triage Notes (Signed)
Pt here for evaluation of head wound/injury from when he fell out of chair hitting head on floor. Larey Seat a few days ago and was seen here.  Starting yesterday began with dizziness.  Increased pain to wound. Scant purulent drainage. No fevers. Other than elevated BP VSS.  Unlabored.

## 2019-08-26 NOTE — ED Notes (Addendum)
See triage note  States he hit his head about 2 weeks ago  Was seen at that time had CT done   States he had an possible small laceration to top of head  States now he thinks the area is infected

## 2019-08-26 NOTE — Discharge Instructions (Signed)
Follow discharge care instruction take medication as directed.  Wash scalp daily with antibiotic soap solution given with your discharge.

## 2019-08-26 NOTE — ED Triage Notes (Signed)
First RN Note: Pt presents to ED via [facility] with c/o fall and head wound approx 4 days ago. Per EMS pt states wound is scabbed over but reports that when he pushes on it pus comes out.    160/100  VS otherwise stable.

## 2019-08-26 NOTE — ED Notes (Signed)
Discussed with dr Mayford Knife, labs ordered. Will wait for further protocols until seen by MD

## 2019-08-26 NOTE — ED Provider Notes (Signed)
[facility] Emergency Department Provider Note   ____________________________________________   First MD Initiated Contact with Patient 08/26/19 1436     (approximate)  I have reviewed the triage vital signs and the nursing notes.   HISTORY  Chief Complaint Wound Check    HPI David Davies is a 39 y.o. male patient presents for evaluation of a wound injury that he received when he fell off a chair 2 weeks ago.  Patient was seen at this location and was diagnosed with a scalp hematoma status post CT scan.  Patient stated few days ago see note a small amount of purulent drainage from the area.  States the area is now crusted over but when palpated drainage is apparent.  Patient rates his pain as 8/10.  Patient has adequate pain medication secondary to previous medical conditions.  No palliative measure for this complaint.         Past Medical History:  Diagnosis Date  . Chronic back pain   . Chronic leg pain    left  . Chronic pain   . DVT (deep venous thrombosis) ([place])   . Hypertension   . Left leg paresthesias     There are no problems to display for this patient.   Past Surgical History:  Procedure Laterality Date  . FASCIOTOMY    . THROMBECTOMY      Prior to Admission medications   Medication Sig Start Date End Date Taking? Authorizing Provider  acetaminophen (TYLENOL) 325 MG tablet Take by mouth. 03/05/19  Yes [provider]  ascorbic acid (VITAMIN C) 500 MG tablet Take by mouth. 03/05/19 03/04/20 Yes [provider]  aspirin 81 MG chewable tablet Chew by mouth. 05/08/16 03/04/20 Yes [provider]  atorvastatin (LIPITOR) 40 MG tablet Take by mouth. 05/08/16  Yes [provider]  Butalbital-APAP-Caffeine 50-300-40 MG CAPS Take by mouth. 05/08/16  Yes [provider]  clopidogrel (PLAVIX) 75 MG tablet Take by mouth. 05/08/16  Yes [provider]  clotrimazole (LOTRIMIN) 1 % cream Apply  topically. 03/05/19 03/04/20 Yes [provider]  diclofenac (VOLTAREN) 75 MG EC tablet diclofenac sodium 75 mg tablet,delayed release 06/24/18  Yes [provider]  Multiple Vitamin (MULTI-VITAMIN) tablet Take by mouth. 03/05/19 03/04/20 Yes [provider]  traMADol (ULTRAM) 50 MG tablet Take by mouth. 05/08/16  Yes [provider]  amitriptyline (ELAVIL) 50 MG tablet Take 50 mg by mouth at bedtime. 07/26/19   [provider]  apixaban (ELIQUIS) 5 MG TABS tablet Take 1 tablet (5 mg total) by mouth 2 (two) times daily. 05/07/19   Earleen Newport, MD  bismuth subsalicylate (PEPTO BISMOL) 262 MG/15ML suspension Take 30 mLs by mouth every 6 (six) hours as needed (stomach pain).     [provider]  cephALEXin (KEFLEX) 500 MG capsule Take 1 capsule (500 mg total) by mouth 4 (four) times daily for 10 days. 08/26/19 09/05/19  Sable Feil, PA-C  metoprolol succinate (TOPROL XL) 25 MG 24 hr tablet Take 1 tablet (25 mg total) by mouth daily. 06/05/19 08/04/19  Lilia Pro., MD  tiZANidine (ZANAFLEX) 4 MG tablet  07/26/19   [provider]  gabapentin (NEURONTIN) 300 MG capsule Take 1 capsule (300 mg total) by mouth 3 (three) times daily. Patient not taking: Reported on 12/15/2014 08/22/14 12/15/14  Hendricks Limes, MD    Allergies Patient has no known allergies.  History reviewed. No pertinent family history.  Social History Social History  Tobacco Use  . Smoking status: Never Smoker  . Smokeless tobacco: Never Used  Substance Use Topics  . Alcohol use: No  . Drug use: No   Review of Systems Constitutional: No fever/chills Eyes: No visual changes. ENT: No sore throat. Cardiovascular: Denies chest pain. Respiratory: Denies shortness of breath. Gastrointestinal: No abdominal pain.  No nausea, no vomiting.  No diarrhea.  No constipation. Genitourinary: Negative for dysuria. Musculoskeletal: Chronic back and knee pain with  history of BKA left lower extremity.. Skin: Negative for rash.  Wound right parietal scalp with Neurological: Negative for headaches, focal weakness or numbness.   ____________________________________________   PHYSICAL EXAM:  VITAL SIGNS: ED Triage Vitals [08/26/19 1337]  Enc Vitals Group     BP (!) 164/117     Pulse Rate 89     Resp 18     Temp 98.2 F (36.8 C)     Temp Source Oral     SpO2 97 %     Weight 185 lb (83.9 kg)     Height 5\' 5"  (1.651 m)     Head Circumference      Peak Flow      Pain Score 8     Pain Loc      Pain Edu?      Excl. in [place]?    Constitutional: Alert and oriented. Well appearing and in no acute distress. Neck:No cervical spine tenderness to palpation. Hematological/Lymphatic/Immunilogical: No cervical lymphadenopathy. Cardiovascular: Normal rate, regular rhythm. Grossly normal heart sounds.  Good peripheral circulation.  Elevated blood pressure Respiratory: Normal respiratory effort.  No retractions. Lungs CTAB. Neurologic:  Normal speech and language. No gross focal neurologic deficits are appreciated. No gait instability. Skin: Nonfluctuant crust over nodule lesion right parietal scalp Psychiatric: Mood and affect are normal. Speech and behavior are normal.  ____________________________________________   LABS (all labs ordered are listed, but only abnormal results are displayed)  Labs Reviewed  CBC - Abnormal; Notable for the following components:      Result Value   Hemoglobin 12.8 (*)    HCT 38.5 (*)    RDW 16.6 (*)    All other components within normal limits  BASIC METABOLIC PANEL   ____________________________________________  EKG   ____________________________________________  RADIOLOGY  ED MD interpretation:    Official radiology report(s): No results found.  ____________________________________________   PROCEDURES  Procedure(s) performed (including Critical  Care):  Procedures   ____________________________________________   INITIAL IMPRESSION / ASSESSMENT AND PLAN / ED COURSE  As part of my medical decision making, I reviewed the following data within the electronic MEDICAL RECORD NUMBER     Nonfluctuant abscess of the scalp.  Discussed patient rationale for not incising and draining.  Patient given IV Rocephin and a prescription for Keflex.  Patient also given antibacterial scrub to use daily and cleaning the scalp.  Patient given discharge care instruction advised him to ED if condition worsens.    David Davies was evaluated in Emergency Department on 08/26/2019 for the symptoms described in the history of present illness. He was evaluated in the context of the global COVID-19 pandemic, which necessitated consideration that the patient might be at risk for infection with the SARS-CoV-2 virus that causes COVID-19. Institutional protocols and algorithms that pertain to the evaluation of patients at risk for COVID-19 are in a state of rapid change based on information released by regulatory bodies including the CDC and federal and state organizations. These policies and algorithms were followed during the patient's care  in the ED.       ____________________________________________   FINAL CLINICAL IMPRESSION(S) / ED DIAGNOSES  Final diagnoses:  Superficial injury of scalp with infection, initial encounter     ED Discharge Orders         Ordered    cephALEXin (KEFLEX) 500 MG capsule  4 times daily     08/26/19 1526           Note:  This document was prepared using Dragon voice recognition software and may include unintentional dictation errors.    Joni Reining, PA-C 08/26/19 1536    Emily Filbert, MD 08/26/19 1539

## 2019-08-26 NOTE — ED Notes (Addendum)
Attempted for 22g at R ac. Pt states history of being a hard stick. Pt jerks arm around while being stuck stating "it hurts". Will have 2nd RN try. Pt states history of HTN and that he took his meds today.

## 2019-09-13 ENCOUNTER — Encounter: Payer: Self-pay | Admitting: *Deleted

## 2019-09-13 ENCOUNTER — Other Ambulatory Visit: Payer: Self-pay

## 2019-09-13 ENCOUNTER — Emergency Department: Payer: Medicare Other

## 2019-09-13 DIAGNOSIS — Z5321 Procedure and treatment not carried out due to patient leaving prior to being seen by health care provider: Secondary | ICD-10-CM | POA: Insufficient documentation

## 2019-09-13 DIAGNOSIS — M79604 Pain in right leg: Secondary | ICD-10-CM | POA: Diagnosis not present

## 2019-09-13 DIAGNOSIS — R200 Anesthesia of skin: Secondary | ICD-10-CM | POA: Diagnosis present

## 2019-09-13 LAB — CBC
HCT: 41.5 % (ref 39.0–52.0)
Hemoglobin: 14.2 g/dL (ref 13.0–17.0)
MCH: 28.9 pg (ref 26.0–34.0)
MCHC: 34.2 g/dL (ref 30.0–36.0)
MCV: 84.3 fL (ref 80.0–100.0)
Platelets: 290 10*3/uL (ref 150–400)
RBC: 4.92 MIL/uL (ref 4.22–5.81)
RDW: 15.2 % (ref 11.5–15.5)
WBC: 5.8 10*3/uL (ref 4.0–10.5)
nRBC: 0 % (ref 0.0–0.2)

## 2019-09-13 LAB — BASIC METABOLIC PANEL
Anion gap: 11 (ref 5–15)
BUN: 12 mg/dL (ref 6–20)
CO2: 24 mmol/L (ref 22–32)
Calcium: 9.8 mg/dL (ref 8.9–10.3)
Chloride: 102 mmol/L (ref 98–111)
Creatinine, Ser: 1.01 mg/dL (ref 0.61–1.24)
GFR calc Af Amer: >60 mL/min (ref >60)
GFR calc non Af Amer: >60 mL/min (ref >60)
Glucose, Bld: 100 mg/dL — ABNORMAL HIGH (ref 70–99)
Potassium: 4.3 mmol/L (ref 3.5–5.1)
Sodium: 137 mmol/L (ref 135–145)

## 2019-09-13 LAB — PROTIME-INR
INR: 1.1 (ref 0.8–1.2)
Prothrombin Time: 13.8 seconds (ref 11.4–15.2)

## 2019-09-13 LAB — APTT: aPTT: 35 seconds (ref 24–36)

## 2019-09-13 NOTE — ED Triage Notes (Signed)
Pt to ED reporting numbness in right leg x 2 days with pain in shin and foot. 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

## 2019-09-14 ENCOUNTER — Encounter: Payer: Self-pay | Admitting: Emergency Medicine

## 2019-09-14 ENCOUNTER — Encounter: Payer: Medicare Other | Attending: Internal Medicine | Admitting: Internal Medicine

## 2019-09-14 ENCOUNTER — Emergency Department
Admission: EM | Admit: 2019-09-14 | Discharge: 2019-09-14 | Discharge status: Against Medical Advice | Payer: Medicare Other | Source: Home / Self Care

## 2019-09-14 ENCOUNTER — Emergency Department
Admission: EM | Admit: 2019-09-14 | Discharge: 2019-09-14 | Disposition: A | Discharge status: Home / Self Care | Payer: Medicare Other | Attending: Emergency Medicine | Admitting: Emergency Medicine

## 2019-09-14 ENCOUNTER — Other Ambulatory Visit: Payer: Self-pay

## 2019-09-14 DIAGNOSIS — Z87891 Personal history of nicotine dependence: Secondary | ICD-10-CM | POA: Diagnosis not present

## 2019-09-14 DIAGNOSIS — Z96641 Presence of right artificial hip joint: Secondary | ICD-10-CM | POA: Diagnosis not present

## 2019-09-14 DIAGNOSIS — Z8249 Family history of ischemic heart disease and other diseases of the circulatory system: Secondary | ICD-10-CM | POA: Diagnosis not present

## 2019-09-14 DIAGNOSIS — Z89512 Acquired absence of left leg below knee: Secondary | ICD-10-CM | POA: Insufficient documentation

## 2019-09-14 DIAGNOSIS — Z7901 Long term (current) use of anticoagulants: Secondary | ICD-10-CM | POA: Insufficient documentation

## 2019-09-14 DIAGNOSIS — Z9119 Patient's noncompliance with other medical treatment and regimen: Secondary | ICD-10-CM | POA: Insufficient documentation

## 2019-09-14 DIAGNOSIS — Z823 Family history of stroke: Secondary | ICD-10-CM | POA: Insufficient documentation

## 2019-09-14 DIAGNOSIS — L98499 Non-pressure chronic ulcer of skin of other sites with unspecified severity: Secondary | ICD-10-CM | POA: Insufficient documentation

## 2019-09-14 DIAGNOSIS — I10 Essential (primary) hypertension: Secondary | ICD-10-CM | POA: Insufficient documentation

## 2019-09-14 NOTE — ED Triage Notes (Signed)
Patient ambulatory to triage with steady gait, without difficulty or distress noted, mask in place; pt here last night (waiting approx 7hrs, went to exam room but left immed because he "had to go get his car"); pt is returning to get his results

## 2019-09-14 NOTE — ED Triage Notes (Signed)
Pt called from [facility] to treatment room, no response ?

## 2019-09-14 NOTE — ED Notes (Signed)
Pt was escorted to hallway bed, seated, then promptly stood up and said "I'm leaving" and walked out. Refused signature or to see provider.

## 2019-09-14 NOTE — ED Triage Notes (Signed)
Pt called from [facility] to treatment room, no response 

## 2019-09-14 NOTE — ED Triage Notes (Signed)
Pt called from [facility] to treatment room, no response

## 2019-09-14 NOTE — Progress Notes (Signed)
David, Davies ([id number]) Visit Report for 09/14/2019 Abuse/Suicide Risk Screen Details Patient Name: David Davies, David Davies Date of Service: 09/14/2019 1:00 PM Medical Record Number: [id number] Patient Account Number: [id number] Date of Birth/Sex: Mar 30, 1981 (39 y.o. M) Treating RN: Curtis Sites Primary Care Reed Dady: PATIENT, NO Other Clinician: Referring Saahir Prude: Daryel November Treating Corinthia Helmers/Extender: Altamese [place] in Treatment: 0 Abuse/Suicide Risk Screen Items Answer ABUSE RISK SCREEN: Has anyone close to you tried to hurt or harm you recentlyo No Do you feel uncomfortable with anyone in your familyo No Has anyone forced you do things that you didnot want to doo No Electronic Signature(s) Signed: 09/14/2019 4:33:52 PM By: Curtis Sites Entered By: Curtis Sites on 09/14/2019 13:08:36 David Davies ([id number]) -------------------------------------------------------------------------------- Activities of Daily Living Details Patient Name: David Davies Date of Service: 09/14/2019 1:00 PM Medical Record Number: [id number] Patient Account Number: [id number] Date of Birth/Sex: February 17, 1981 (39 y.o. M) Treating RN: Curtis Sites Primary Care Kionna Brier: PATIENT, NO Other Clinician: Referring Samie Barclift: Daryel November Treating Terryn Rosenkranz/Extender: Altamese [place] in Treatment: 0 Activities of Daily Living Items Answer Activities of Daily Living (Please select one for each item) Drive Automobile Completely Able Take Medications Completely Able Use Telephone Completely Able Care for Appearance Completely Able Use Toilet Completely Able Bath / Shower Completely Able Dress Self Completely Able Feed Self Completely Able Walk Completely Able Get In / Out Bed Completely Able Housework Completely Able Prepare Meals Completely Able Handle Money Completely Able Shop for Self Completely Able Electronic Signature(s) Signed: 09/14/2019 4:33:52 PM By: Curtis Sites Entered By: Curtis Sites on 09/14/2019 13:09:00 David Davies ([id number]) -------------------------------------------------------------------------------- Education Screening Details Patient Name: David Davies Date of Service: 09/14/2019 1:00 PM Medical Record Number: [id number] Patient Account Number: [id number] Date of Birth/Sex: 11/08/1980 (38 y.o. M) Treating RN: Curtis Sites Primary Care [name]: PATIENT, NO Other Clinician: Referring Montell Leopard: Daryel November Treating Shiri Hodapp/Extender: Altamese [place] in Treatment: 0 Primary Learner Assessed: Patient Learning Preferences/Education Level/Primary Language Learning Preference: Explanation, Demonstration Highest Education Level: College or Above Preferred Language: English Cognitive Barrier Language Barrier: No Translator Needed: No Memory Deficit: No Emotional Barrier: No Cultural/Religious Beliefs Affecting Medical Care: No Physical Barrier Impaired Vision: No Impaired Hearing: No Decreased Hand dexterity: No Knowledge/Comprehension Knowledge Level: Medium Comprehension Level: Medium Ability to understand written instructions: Medium Ability to understand verbal instructions: Medium Motivation Anxiety Level: Calm Cooperation: Cooperative Education Importance: Acknowledges Need Interest in Health Problems: Asks Questions Perception: Coherent Willingness to Engage in Self-Management Medium Activities: Readiness to Engage in Self-Management Medium Activities: Electronic Signature(s) Signed: 09/14/2019 4:33:52 PM By: Curtis Sites Entered By: Curtis Sites on 09/14/2019 13:09:23 David Davies ([id number]) -------------------------------------------------------------------------------- Fall Risk Assessment Details Patient Name: David Davies Date of Service: 09/14/2019 1:00 PM Medical Record Number: [id number] Patient Account Number: [id number] Date of Birth/Sex: 07/14/1980 (38 y.o.  M) Treating RN: Curtis Sites Primary Care [name]: PATIENT, NO Other Clinician: Referring Delbert Darley: Daryel November Treating Jenavi Beedle/Extender: Altamese [place] in Treatment: 0 Fall Risk Assessment Items Have you had 2 or more falls in the last 12 monthso 0 No Have you had any fall that resulted in injury in the last 12 monthso 0 Yes FALLS RISK SCREEN History of falling - immediate or within 3 months 25 Yes Secondary diagnosis (Do you have 2 or more medical diagnoseso) 0 No Ambulatory aid None/bed rest/wheelchair/nurse 0 Yes Crutches/cane/walker 0 No Furniture 0 No Intravenous therapy Access/Saline/Heparin Lock 0 No Gait/Transferring Normal/ bed rest/ wheelchair 0 Yes Weak (short steps with or without shuffle, stooped but able  to lift head while walking, may 0 No seek support from furniture) Impaired (short steps with shuffle, may have difficulty arising from chair, head down, impaired 0 No balance) Mental Status Oriented to own ability 0 Yes Electronic Signature(s) Signed: 09/14/2019 4:33:52 PM By: Montey Hora Entered By: Montey Hora on 09/14/2019 13:09:44 David Davies ([id number]) -------------------------------------------------------------------------------- Foot Assessment Details Patient Name: David Davies Date of Service: 09/14/2019 1:00 PM Medical Record Number: [id number] Patient Account Number: [id number] Date of Birth/Sex: Nov 28, 1980 (38 y.o. M) Treating RN: Montey Hora Primary Care Haleemah Buckalew: PATIENT, NO Other Clinician: Referring Rylin Seavey: Lenise Arena Treating Kathalina Ostermann/Extender: Tito Dine in Treatment: 0 Foot Assessment Items Site Locations + = Sensation present, - = Sensation absent, C = Callus, U = Ulcer R = Redness, W = Warmth, M = Maceration, PU = Pre-ulcerative lesion F = Fissure, S = Swelling, D = Dryness Assessment Right: Left: Other Deformity: No No Prior Foot Ulcer: No No Prior Amputation: No No Charcot  Joint: No No Ambulatory Status: Ambulatory Without Help Gait: Steady Electronic Signature(s) Signed: 09/14/2019 4:33:52 PM By: Montey Hora Entered By: Montey Hora on 09/14/2019 13:09:58 David Davies ([id number]) -------------------------------------------------------------------------------- Nutrition Risk Screening Details Patient Name: David Davies Date of Service: 09/14/2019 1:00 PM Medical Record Number: [id number] Patient Account Number: [id number] Date of Birth/Sex: [birth year] (39 y.o. M) Treating RN: Montey Hora Primary Care Alverna Fawley: PATIENT, NO Other Clinician: Referring Yameli Delamater: Lenise Arena Treating Chea Malan/Extender: Tito Dine in Treatment: 0 Height (in): 65 Weight (lbs): 175 Body Mass Index (BMI): 29.1 Nutrition Risk Screening Items Score Screening NUTRITION RISK SCREEN: I have an illness or condition that made me change the kind and/or amount of food I eat 0 No I eat fewer than two meals per day 0 No I eat few fruits and vegetables, or milk products 0 No I have three or more drinks of beer, liquor or wine almost every day 0 No I have tooth or mouth problems that make it hard for me to eat 0 No I don't always have enough money to buy the food I need 0 No I eat alone most of the time 0 No I take three or more different prescribed or over-the-counter drugs a day 1 Yes Without wanting to, I have lost or gained 10 pounds in the last six months 0 No I am not always physically able to shop, cook and/or feed myself 0 No Nutrition Protocols Good Risk Protocol 0 No interventions needed Moderate Risk Protocol High Risk Proctocol Risk Level: Good Risk Score: 1 Electronic Signature(s) Signed: 09/14/2019 4:33:52 PM By: Montey Hora Entered By: Montey Hora on 09/14/2019 13:09:51

## 2019-09-14 NOTE — ED Notes (Signed)
Pt called from [facility] to treatment room, no response 

## 2019-09-15 NOTE — Progress Notes (Signed)
[facility], [facility] ([id number]) Visit Report for 09/14/2019 Allergy List Details Patient Name: David Davies, David Davies Date of Service: 09/14/2019 1:00 PM Medical Record Number: [id number] Patient Account Number: [id number] Date of Birth/Sex: 1980/07/07 (39 y.o. M) Treating RN: Curtis Sites Primary Care Tailyn Hantz: PATIENT, NO Other Clinician: Referring Shoshana Johal: Daryel November Treating Jisel Fleet/Extender: Maxwell Caul Weeks in Treatment: 0 Allergies Active Allergies No Known Drug Allergies Allergy Notes Electronic Signature(s) Signed: 09/14/2019 4:33:52 PM By: Curtis Sites Entered By: Curtis Sites on 09/14/2019 13:06:52 David Davies ([id number]) -------------------------------------------------------------------------------- Arrival Information Details Patient Name: David Davies Date of Service: 09/14/2019 1:00 PM Medical Record Number: [id number] Patient Account Number: [id number] Date of Birth/Sex: [birth year] (39 y.o. M) Treating RN: Huel Coventry Primary Care Chrishauna Mee: PATIENT, NO Other Clinician: Referring Tawonda Legaspi: Daryel November Treating Cheetara Hoge/Extender: Altamese Petronila in Treatment: 0 Visit Information Patient Arrived: Ambulatory Arrival Time: 13:00 Accompanied By: self Transfer Assistance: None Patient Identification Verified: Yes Secondary Verification Process Completed: Yes Patient Has Alerts: Yes Patient Alerts: Patient on Blood Thinner Eliquis History Since Last Visit Added or deleted any medications: No Any new allergies or adverse reactions: No Had a fall or experienced change in activities of daily living that may affect risk of falls: No Signs or symptoms of abuse/neglect since last visito No Hospitalized since last visit: No Implantable device outside of the clinic excluding cellular tissue based products placed in the center since last visit: No Electronic Signature(s) Signed: 09/14/2019 4:33:52 PM By: Curtis Sites Entered By: Curtis Sites on  09/14/2019 13:10:53 David Davies ([id number]) -------------------------------------------------------------------------------- Clinic Level of Care Assessment Details Patient Name: David Davies Date of Service: 09/14/2019 1:00 PM Medical Record Number: [id number] Patient Account Number: [id number] Date of Birth/Sex: Jan 15, 1981 (39 y.o. M) Treating RN: Huel Coventry Primary Care Armya Westerhoff: PATIENT, NO Other Clinician: Referring Ryker Pherigo: Daryel November Treating Randell Detter/Extender: Altamese [place] in Treatment: 0 Clinic Level of Care Assessment Items TOOL 2 Quantity Score []  - Use when only an EandM is performed on the INITIAL visit 0 ASSESSMENTS - Nursing Assessment / Reassessment X - General Physical Exam (combine w/ comprehensive assessment (listed just below) when performed on new 1 20 pt. evals) X- 1 25 Comprehensive Assessment (HX, ROS, Risk Assessments, Wounds Hx, etc.) ASSESSMENTS - Wound and Skin Assessment / Reassessment X - Simple Wound Assessment / Reassessment - one wound 1 5 []  - 0 Complex Wound Assessment / Reassessment - multiple wounds []  - 0 Dermatologic / Skin Assessment (not related to wound area) ASSESSMENTS - Ostomy and/or Continence Assessment and Care []  - Incontinence Assessment and Management 0 []  - 0 Ostomy Care Assessment and Management (repouching, etc.) PROCESS - Coordination of Care X - Simple Patient / Family Education for ongoing care 1 15 []  - 0 Complex (extensive) Patient / Family Education for ongoing care []  - 0 Staff obtains , Records, Test Results / Process Orders []  - 0 Staff telephones HHA, Nursing Homes / Clarify orders / etc []  - 0 Routine Transfer to another Facility (non-emergent condition) []  - 0 Routine Hospital Admission (non-emergent condition) X- 1 15 New Admissions / / Ordering NPWT, Apligraf, etc. []  - 0 Emergency Hospital Admission (emergent condition) X- 1 10 Simple Discharge  Coordination []  - 0 Complex (extensive) Discharge Coordination PROCESS - Special Needs []  - Pediatric / Minor Patient Management 0 []  - 0 Isolation Patient Management []  - 0 Hearing / Language / Visual special needs []  - 0 Assessment of Community assistance (transportation, D/C planning, etc.) []  - 0 Additional assistance /  Altered mentation []  - 0 Support Surface(s) Assessment (bed, cushion, seat, etc.) INTERVENTIONS - Wound Cleansing / Measurement X - Wound Imaging (photographs - any number of wounds) 1 5 []  - 0 Wound Tracing (instead of photographs) X- 1 5 Simple Wound Measurement - one wound []  - 0 Complex Wound Measurement - multiple wounds David Davies, David Davies ([id number]) X- 1 5 Simple Wound Cleansing - one wound []  - 0 Complex Wound Cleansing - multiple wounds INTERVENTIONS - Wound Dressings []  - Small Wound Dressing one or multiple wounds 0 X- 1 15 Medium Wound Dressing one or multiple wounds []  - 0 Large Wound Dressing one or multiple wounds []  - 0 Application of Medications - injection INTERVENTIONS - Miscellaneous []  - External ear exam 0 []  - 0 Specimen Collection (cultures, biopsies, blood, body fluids, etc.) []  - 0 Specimen(s) / Culture(s) sent or taken to Lab for analysis []  - 0 Patient Transfer (multiple staff / [employer] fast streamer / Similar devices) []  - 0 Simple Staple / Suture removal (25 or less) []  - 0 Complex Staple / Suture removal (26 or more) []  - 0 Hypo / Hyperglycemic Management (close monitor of Blood Glucose) []  - 0 Ankle / Brachial Index (ABI) - do not check if billed separately Has the patient been seen at the hospital within the last three years: Yes Total Score: 120 Level Of Care: New/Established - Level 4 Electronic Signature(s) Signed: 09/15/2019 1:40:27 PM By: Gretta Cool, BSN, RN, CWS, Kim RN, BSN Entered By: Gretta Cool, BSN, RN, CWS, Kim on 09/15/2019 11:51:17 David Davies  ([id number]) -------------------------------------------------------------------------------- Encounter Discharge Information Details Patient Name: David Davies Date of Service: 09/14/2019 1:00 PM Medical Record Number: [id number] Patient Account Number: [id number] Date of Birth/Sex: [birth year] (39 y.o. M) Treating RN: Cornell Barman Primary Care Amiri Tritch: PATIENT, NO Other Clinician: Referring Blenda Wisecup: Lenise Arena Treating Rawley Harju/Extender: Tito Dine in Treatment: 0 Encounter Discharge Information Items Discharge Condition: Stable Ambulatory Status: Ambulatory Discharge Destination: Home Transportation: Private Auto Accompanied By: self Schedule Follow-up Appointment: Yes Clinical Summary of Care: Electronic Signature(s) Signed: 09/15/2019 1:40:27 PM By: Gretta Cool, BSN, RN, CWS, Kim RN, BSN Entered By: Gretta Cool, BSN, RN, CWS, Kim on 09/14/2019 13:24:52 David Davies ([id number]) -------------------------------------------------------------------------------- Lower Extremity Assessment Details Patient Name: David Davies Date of Service: 09/14/2019 1:00 PM Medical Record Number: [id number] Patient Account Number: [id number] Date of Birth/Sex: 07-Dec-1980 (39 y.o. M) Treating RN: Montey Hora Primary Care Coda Filler: PATIENT, NO Other Clinician: Referring Kaidon Kinker: Lenise Arena Treating Briggs Edelen/Extender: Ricard Dillon Weeks in Treatment: 0 Electronic Signature(s) Signed: 09/14/2019 4:33:52 PM By: Montey Hora Entered By: Montey Hora on 09/14/2019 13:10:31 David Davies ([id number]) -------------------------------------------------------------------------------- Multi Wound Chart Details Patient Name: David Davies Date of Service: 09/14/2019 1:00 PM Medical Record Number: [id number] Patient Account Number: [id number] Date of Birth/Sex: 09-Feb-1981 (39 y.o. M) Treating RN: Cornell Barman Primary Care Silviano Neuser: PATIENT, NO Other Clinician: Referring Statia Burdick:  Lenise Arena Treating Alysse Rathe/Extender: Tito Dine in Treatment: 0 Vital Signs Height(in): 65 Pulse(bpm): 96 Weight(lbs): 175 Blood Pressure(mmHg): 138/95 Body Mass Index(BMI): 29 Temperature(F): 98.0 Respiratory Rate(breaths/min): 16 Photos: [N/A:N/A] Wound Location: Head - frontal N/A N/A Wounding Event: Trauma N/A N/A Primary Etiology: Trauma, Other N/A N/A Comorbid History: Hypertension, Neuropathy N/A N/A Date Acquired: 08/13/2019 N/A N/A Weeks of Treatment: 0 N/A N/A Wound Status: Open N/A N/A Measurements L x W x D (cm) 0.6x0.7x0.1 N/A N/A Area (cm) : 0.33 N/A N/A Volume (cm) : 0.033 N/A N/A Classification: Full Thickness Without Exposed N/A N/A Support Structures Exudate Amount: Medium N/A N/A  Exudate Type: Serous N/A N/A Exudate Color: amber N/A N/A Wound Margin: Flat and Intact N/A N/A Granulation Amount: Large (67-100%) N/A N/A Granulation Quality: Pink N/A N/A Necrotic Amount: None Present (0%) N/A N/A Exposed Structures: Fat Layer (Subcutaneous Tissue) N/A N/A Exposed: Yes Fascia: No Tendon: No Muscle: No Joint: No Bone: No Epithelialization: Small (1-33%) N/A N/A Treatment Notes Wound #3 (Head - frontal) Notes Prisma moistened with hydrogel, coverlet Electronic Signature(s) Signed: 09/14/2019 4:41:32 PM By: Baltazar Najjar MD Entered By: Baltazar Najjar on 09/14/2019 13:28:27 David Davies, David Davies ([id number] Davies ([id number]) -------------------------------------------------------------------------------- Multi-Disciplinary Care Plan Details Patient Name: David Davies Date of Service: 09/14/2019 1:00 PM Medical Record Number: [id number] Patient Account Number: [id number] Date of Birth/Sex: 08-30-1980 (38 y.o. M) Treating RN: Huel Coventry Primary Care Daryl Quiros: PATIENT, NO Other Clinician: Referring Najeh Credit: Daryel November Treating Vinnie Gombert/Extender: Altamese [place] in Treatment: 0 Active Inactive Orientation to  the Wound Care Program Nursing Diagnoses: Knowledge deficit related to the wound healing center program Goals: Patient/caregiver will verbalize understanding of the Wound Healing Center Program Date Initiated: 09/14/2019 Target Resolution Date: 09/21/2019 Goal Status: Active Interventions: Provide education on orientation to the wound center Notes: Soft Tissue Infection Nursing Diagnoses: Impaired tissue integrity Goals: Patient will remain free of wound infection Date Initiated: 09/14/2019 Target Resolution Date: 09/21/2019 Goal Status: Active Interventions: Assess signs and symptoms of infection every visit Notes: Wound/Skin Impairment Nursing Diagnoses: Impaired tissue integrity Goals: Patient/caregiver will verbalize understanding of skin care regimen Date Initiated: 09/14/2019 Target Resolution Date: 09/21/2019 Goal Status: Active Interventions: Provide education on ulcer and skin care Treatment Activities: Skin care regimen initiated : 09/14/2019 Notes: Electronic Signature(s) Signed: 09/15/2019 1:40:27 PM By: Elliot Gurney, BSN, RN, CWS, Kim RN, BSN Entered By: Elliot Gurney, BSN, RN, CWS, Kim on 09/14/2019 13:21:27 David Davies ([id number]) -------------------------------------------------------------------------------- Pain Assessment Details Patient Name: David Davies Date of Service: 09/14/2019 1:00 PM Medical Record Number: [id number] Patient Account Number: [id number] Date of Birth/Sex: [birth year] (38 y.o. M) Treating RN: Huel Coventry Primary Care Keyasha Miah: PATIENT, NO Other Clinician: Referring Yomaira Solar: Daryel November Treating Benino Korinek/Extender: Altamese [place] in Treatment: 0 Active Problems Location of Pain Severity and Description of Pain Patient Has Paino No Site Locations Pain Management and Medication Current Pain Management: Electronic Signature(s) Signed: 09/14/2019 4:11:28 PM By: Dayton Martes RCP, RRT, CHT Signed: 09/15/2019 1:40:27 PM By: Elliot Gurney,  BSN, RN, CWS, Kim RN, BSN Entered By: Dayton Martes on 09/14/2019 13:01:11 David Davies ([id number]) -------------------------------------------------------------------------------- Patient/Caregiver Education Details Patient Name: David Davies Date of Service: 09/14/2019 1:00 PM Medical Record Number: [id number] Patient Account Number: [id number] Date of Birth/Gender: 11-Sep-1980 (38 y.o. M) Treating RN: Huel Coventry Primary Care Physician: PATIENT, NO Other Clinician: Referring Physician: Daryel November Treating Physician/Extender: Altamese [place] in Treatment: 0 Education Assessment Education Provided To: Patient Education Topics Provided Wound/Skin Impairment: Handouts: Caring for Your Ulcer Methods: Demonstration, Explain/Verbal Responses: State content correctly Electronic Signature(s) Signed: 09/15/2019 1:40:27 PM By: Elliot Gurney, BSN, RN, CWS, Kim RN, BSN Entered By: Elliot Gurney, BSN, RN, CWS, Kim on 09/15/2019 11:51:36 David Davies ([id number]) -------------------------------------------------------------------------------- Wound Assessment Details Patient Name: David Davies Date of Service: 09/14/2019 1:00 PM Medical Record Number: [id number] Patient Account Number: [id number] Date of Birth/Sex: 1980-08-23 (38 y.o. M) Treating RN: Curtis Sites Primary Care Nadege Carriger: PATIENT, NO Other Clinician: Referring Addalie Calles: Daryel November Treating Alesandro Stueve/Extender: Altamese  in Treatment: 0 Wound Status Wound Number: 3 Primary Etiology: Trauma, Other Wound Location: Head - frontal Wound Status: Open Wounding Event: Trauma Comorbid History: Hypertension, Neuropathy  Date Acquired: 08/13/2019 Weeks Of Treatment: 0 Clustered Wound: No Photos Wound Measurements Length: (cm) 0.6 Width: (cm) 0.7 Depth: (cm) 0.1 Area: (cm) 0.33 Volume: (cm) 0.033 % Reduction in Area: % Reduction in Volume: Epithelialization: Small (1-33%) Tunneling:  No Undermining: No Wound Description Classification: Full Thickness Without Exposed Support Structu Wound Margin: Flat and Intact Exudate Amount: Medium Exudate Type: Serous Exudate Color: amber res Foul Odor After Cleansing: No Slough/Fibrino No Wound Bed Granulation Amount: Large (67-100%) Exposed Structure Granulation Quality: Pink Fascia Exposed: No Necrotic Amount: None Present (0%) Fat Layer (Subcutaneous Tissue) Exposed: Yes Tendon Exposed: No Muscle Exposed: No Joint Exposed: No Bone Exposed: No Treatment Notes Wound #3 (Head - frontal) Notes Prisma moistened with hydrogel, coverlet Electronic Signature(s) Signed: 09/14/2019 4:33:52 PM By: Marisa Cyphers, Kipp Brood ([id number]) Entered By: Curtis Sites on 09/14/2019 13:14:16 David Davies ([id number]) -------------------------------------------------------------------------------- Vitals Details Patient Name: David Davies Date of Service: 09/14/2019 1:00 PM Medical Record Number: [id number] Patient Account Number: [id number] Date of Birth/Sex: 1980/09/10 (38 y.o. M) Treating RN: Huel Coventry Primary Care Sadie Hazelett: PATIENT, NO Other Clinician: Referring Keionna Kinnaird: Daryel November Treating Jinna Weinman/Extender: Altamese [place] in Treatment: 0 Vital Signs Time Taken: 13:00 Temperature (F): 98.0 Height (in): 65 Pulse (bpm): 96 Source: Stated Respiratory Rate (breaths/min): 16 Weight (lbs): 175 Blood Pressure (mmHg): 138/95 Source: Stated Reference Range: 80 - 120 mg / dl Body Mass Index (BMI): 29.1 Electronic Signature(s) Signed: 09/14/2019 4:11:28 PM By: Dayton Martes RCP, RRT, CHT Entered By: Dayton Martes on 09/14/2019 13:03:59

## 2019-09-15 NOTE — Progress Notes (Signed)
David Davies, David Davies ([id number]) Visit Report for 09/14/2019 Chief Complaint Document Details Patient Name: David Davies, David Davies Date of Service: 09/14/2019 1:00 PM Medical Record Number: [id number] Patient Account Number: [id number] Date of Birth/Sex: 1980-04-23 (39 y.o. M) Treating RN: Huel Coventry Primary Care Provider: PATIENT, NO Other Clinician: Referring Provider: Daryel November Treating Provider/Extender: Altamese [place] in Treatment: 0 Information Obtained from: Patient Chief Complaint Right LE surgical ulcers 09/14/2019; patient is here for a nonhealing area on the occiput of his scalp Electronic Signature(s) Signed: 09/14/2019 4:41:32 PM By: Baltazar Najjar MD Entered By: Baltazar Najjar on 09/14/2019 13:28:56 David Davies ([id number]) -------------------------------------------------------------------------------- HPI Details Patient Name: David Davies Date of Service: 09/14/2019 1:00 PM Medical Record Number: [id number] Patient Account Number: [id number] Date of Birth/Sex: 1981/01/25 (38 y.o. M) Treating RN: Huel Coventry Primary Care Provider: PATIENT, NO Other Clinician: Referring Provider: Daryel November Treating Provider/Extender: Altamese  in Treatment: 0 History of Present Illness HPI Description: 05/06/2019 patient presents today to our clinic with a fairly extensive history which I was able to retrieve in epic. Unfortunately it looks as though beginning on February 15, 2019 the patient did have an initial visit with a podiatrist secondary to what was diagnosed as a fungal foot infection. This was at [facility] he saw Reita Cliche. I actually do not have access to that note incompletion that I can see. Nonetheless the patient subsequently 2 days later ended up going to the hospital through the emergency department where he was admitted at [facility] due to a right foot infection. Looking at the discharge summary he was admitted on 02/17/2019 and was not  discharged until 03/05/2019. He has history upon admission was that he had been seen 2 days prior to urgent care and had skin breakdown between his toes. He was given Augmentin and terbinafine at that time. He was instructed to try Epson salt baths but noted extreme worsening in the pain and went to the ED for further evaluation. He had no injury noted. 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 He is on Xarelto for prophylaxis due to thrombosis that he is previously had. 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 He has no history of diabetes. He also states that he quit smoking in 2017. During the hospital admission he denied any use of cocaine or marijuana though there was some question about this as it was reported in the chart. He also denied alcohol use. There was however some initial question as to whether or not his arterial flow was affected on the right. Subsequently he ended up being diagnosed with acute critical limb ischemia in the right lower extremity and had a distal embolectomy on 02/17/2019. This thrombus was in the right anterior tibial artery the patient subsequently ended up having a compartment syndrome and fasciotomy on 02/19/2019. They did end up repeating a CT angiogram with runoff to ensure everything was okay due to his severe pain fortunately everything was stable. 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 With that being said he tells me that he is out of his medication right now. It does not look like he has been going to his appointments as directed he has recently moved to [facility]. Subsequently the patient was reevaluated in December on the ninth of 2020. At this point he was again having issues with no evidence  of DVT but that was the concern due to his extensive pain in the and they suggested that he  follow-up with pain management and this was initiated at that point. Since that time he really has not followed up with anybody from an orthopedic standpoint following the fasciotomy. Upon inspection today the patient does have a history of what appears to be diabetes with a hemoglobin A1c checked in November 2020 to be 8.0, hypertension, peripheral neuropathy, and peripheral vascular disease which seems to be recurrent due to thrombi. Unfortunately right now he tells me he is not taking any of his medicines as he has not been able to get a primary care provider but it looks as if he is also missed several appointments he was supposed to have with multiple providers including hematology. 05/12/2019 upon evaluation today patient appears to be doing better in regard to his wounds. He has been tolerating the dressing changes without complication. Fortunately there is no evidence of active infection at this time. No fevers, chills, nausea, vomiting, or diarrhea. We did get released to see him and continue to take over his care in regard to the wounds currently. The patient had to go to the ER in order to get his medications which included his anticoagulant. Fortunately he has established with a primary care provider now. 05/26/2019 upon evaluation today patient actually appears to be doing quite well with regard to his wounds. Fortunately there is no signs of active infection at this time. No fevers, chills, nausea, vomiting, or diarrhea. 07/08/2019 upon evaluation today patient appears to be doing little better in regard to his wounds compared to previous evaluations. Fortunately there is no signs of active infection. The wounds do seem to be making progress the medial more so than the lateral. Nonetheless he has been using Hydrofera Blue it has been several weeks in fact February 11 since have last seen him. 4/23; patient's wounds have contracted quite a bit. He says that the area laterally had closed last  week but then is recently reopened. He still has a smaller open area on the medial leg wound. These were initially fasciotomy surgical incisions. He is using Hydrofera Blue. His attendance in the clinic has been sporadic. He arrived in clinic yesterday asking for a sent to order him product with opening seen but this was refused. He is also complaining of pain in his leg he feels like there is a deep pain. He saw his primary doctor ordered physical therapy READMISSION 09/14/2019 This is a patient we had in the clinic from 05/06/2019 through 08/09/2019. He had wounds on the lateral and medial part of his right lower extremity which were surgical incision secondary to a fasciotomy he had. He was discharged in a nonhealed state largely because of noncompliance with his clinic visits. Almost a month ago he had a fall off a chair by description he had a scalp hematoma and subsequently an abscess. He was seen 2 times in the ER most recently on 09/05/2019. I am uncertain whether the hematoma became secondarily infected or the original diagnosis of the hematoma was in error. In any case he has just finished the Keflex he was given in the ER. He set up in the last week it was scabbed and fairly bloody but the scab recently came off. He has a small open area that remains Electronic Signature(s) Signed: 09/14/2019 4:41:32 PM By: Baltazar Najjarobson, Alam Guterrez MD Entered By: Baltazar Najjarobson, Kylle Lall on 09/14/2019 13:31:59 David CarpenLYBURN, David Davies ([id number]) -------------------------------------------------------------------------------- Physical Exam Details Patient  Name: David Davies, David Davies Date of Service: 09/14/2019 1:00 PM Medical Record Number: [id number] Patient Account Number: [id number] Date of Birth/Sex: [birth year] (39 y.o. M) Treating RN: Huel Coventry Primary Care Provider: PATIENT, NO Other Clinician: Referring Provider: Daryel November Treating Provider/Extender: Altamese [place] in Treatment: 0 Constitutional Sitting or  standing Blood Pressure is within target range for patient.. David Davies regular and within target range for patient.Marland Kitchen Respirations regular, non- labored and within target range.. Temperature is normal and within the target range for the patient.Marland Kitchen appears in no distress. Cardiovascular . Integumentary (Hair, Skin) Surgical scars on both medial scar on the right leg and there is an area here that is open. I will not define this unless it is worse next week. I asked him to put Polysporin and a Band-Aid on this.. Notes Wound exam; on the occiput of his scalp he has a perfectly circular area with complete absence of any hair growth. In the middle of this is a small open wound. This looks reasonably healthy no debridement was required. There is no evidence of surrounding infection Electronic Signature(s) Signed: 09/14/2019 4:41:32 PM By: Baltazar Najjar MD Entered By: Baltazar Najjar on 09/14/2019 13:33:57 David Davies ([id number]) -------------------------------------------------------------------------------- Physician Orders Details Patient Name: David Davies Date of Service: 09/14/2019 1:00 PM Medical Record Number: [id number] Patient Account Number: [id number] Date of Birth/Sex: [birth year] (38 y.o. M) Treating RN: Huel Coventry Primary Care Provider: PATIENT, NO Other Clinician: Referring Provider: Daryel November Treating Provider/Extender: Altamese [place] in Treatment: 0 Verbal / Phone Orders: No Diagnosis Coding Wound Cleansing Wound #3 Head - frontal o Clean wound with Normal Saline. Anesthetic (add to Medication List) Wound #3 Head - frontal o Topical Lidocaine 4% cream applied to wound bed prior to debridement (In Clinic Only). Primary Wound Dressing Wound #3 Head - frontal o Silver Collagen - moistened with hydrogel Secondary Dressing o Other - coverlet to secure Dressing Change Frequency Wound #3 Head - frontal o Change Dressing Monday, Wednesday,  Friday Follow-up Appointments Wound #3 Head - frontal o Return Appointment in 1 week. Electronic Signature(s) Signed: 09/14/2019 4:41:32 PM By: Baltazar Najjar MD Signed: 09/15/2019 1:40:27 PM By: Elliot Gurney BSN, RN, CWS, Kim RN, BSN Entered By: Elliot Gurney, BSN, RN, CWS, Kim on 09/14/2019 13:23:56 David Davies, David Davies ([id number]) -------------------------------------------------------------------------------- Problem List Details Patient Name: David Davies Date of Service: 09/14/2019 1:00 PM Medical Record Number: [id number] Patient Account Number: [id number] Date of Birth/Sex: [birth year] (38 y.o. M) Treating RN: Huel Coventry Primary Care Provider: PATIENT, NO Other Clinician: Referring Provider: Daryel November Treating Provider/Extender: Altamese [place] in Treatment: 0 Active Problems ICD-10 Encounter Code Description Active Date MDM Diagnosis S01.00XD Unspecified open wound of scalp, subsequent encounter 09/14/2019 No Yes Inactive Problems Resolved Problems Electronic Signature(s) Signed: 09/14/2019 4:41:32 PM By: Baltazar Najjar MD Entered By: Baltazar Najjar on 09/14/2019 13:27:59 David Davies ([id number]) -------------------------------------------------------------------------------- Progress Note Details Patient Name: David Davies Date of Service: 09/14/2019 1:00 PM Medical Record Number: [id number] Patient Account Number: [id number] Date of Birth/Sex: [birth year] (38 y.o. M) Treating RN: Huel Coventry Primary Care Provider: PATIENT, NO Other Clinician: Referring Provider: Daryel November Treating Provider/Extender: Altamese [place] in Treatment: 0 Subjective Chief Complaint Information obtained from Patient Right LE surgical ulcers 09/14/2019; patient is here for a nonhealing area on the occiput of his scalp History of Present Illness (HPI) 05/06/2019 patient presents today to our clinic with a fairly extensive history which I was able to retrieve in epic. Unfortunately  it looks as though beginning on February 15, 2019 the patient did have an initial visit with a podiatrist secondary to what was diagnosed as a fungal foot infection. This was at [facility] he saw Reita Cliche. I actually do not have access to that note incompletion that I can see. Nonetheless the patient subsequently 2 days later ended up going to the hospital through the emergency department where he was admitted at [facility] due to a right foot infection. Looking at the discharge summary he was admitted on 02/17/2019 and was not discharged until 03/05/2019. He has history upon admission was that he had been seen 2 days prior to urgent care and had skin breakdown between his toes. He was given Augmentin and terbinafine at that time. He was instructed to try Epson salt baths but noted extreme worsening in the pain and went to the ED for further evaluation. He had no injury noted. 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 He is on Xarelto for prophylaxis due to thrombosis that he is previously had. 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 He has no history of diabetes. He also states that he quit smoking in 2017. During the hospital admission he denied any use of cocaine or marijuana though there was some question about this as it was reported in the chart. He also denied alcohol use. There was however some initial question as to whether or not his arterial flow was affected on the right. Subsequently he ended up being diagnosed with acute critical limb ischemia in the right lower extremity and had a distal embolectomy on 02/17/2019. This thrombus was in the right anterior tibial artery the patient subsequently ended up having a compartment syndrome and fasciotomy on 02/19/2019. They did end up repeating a CT angiogram with runoff to ensure everything was okay due to his severe pain fortunately  everything was stable. 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 With that being said he tells me that he is out of his medication right now. It does not look like he has been going to his appointments as directed he has recently moved to [facility]. Subsequently the patient was reevaluated in December on the ninth of 2020. At this point he was again having issues with no evidence of DVT but that was the concern due to his extensive pain in the and they suggested that he follow-up with pain management and this was initiated at that point. Since that time he really has not followed up with anybody from an orthopedic standpoint following the fasciotomy. Upon inspection today the patient does have a history of what appears to be diabetes with a hemoglobin A1c checked in November 2020 to be 8.0, hypertension, peripheral neuropathy, and peripheral vascular disease which seems to be recurrent due to thrombi. Unfortunately right now he tells me he is not taking any of his medicines as he has not been able to get a primary care provider but it looks as if he is also missed several appointments he was supposed to have with multiple providers including hematology. 05/12/2019 upon evaluation today patient appears to be doing better in regard to his wounds. He has been tolerating the dressing changes without complication. Fortunately there is no evidence of active infection at this time. No fevers, chills, nausea, vomiting, or diarrhea. We did get released to see him and continue to take over his  care in regard to the wounds currently. The patient had to go to the ER in order to get his medications which included his anticoagulant. Fortunately he has established with a primary care provider now. 05/26/2019 upon evaluation today patient actually appears to be doing quite well with regard to his wounds. Fortunately there is no signs of  active infection at this time. No fevers, chills, nausea, vomiting, or diarrhea. 07/08/2019 upon evaluation today patient appears to be doing little better in regard to his wounds compared to previous evaluations. Fortunately there is no signs of active infection. The wounds do seem to be making progress the medial more so than the lateral. Nonetheless he has been using Hydrofera Blue it has been several weeks in fact February 11 since have last seen him. 4/23; patient's wounds have contracted quite a bit. He says that the area laterally had closed last week but then is recently reopened. He still has a smaller open area on the medial leg wound. These were initially fasciotomy surgical incisions. He is using Hydrofera Blue. His attendance in the clinic has been sporadic. He arrived in clinic yesterday asking for a sent to order him product with opening seen but this was refused. He is also complaining of pain in his leg he feels like there is a deep pain. He saw his primary doctor ordered physical therapy READMISSION 09/14/2019 This is a patient we had in the clinic from 05/06/2019 through 08/09/2019. He had wounds on the lateral and medial part of his right lower extremity which were surgical incision secondary to a fasciotomy he had. He was discharged in a nonhealed state largely because of noncompliance with his clinic visits. Almost a month ago he had a fall off a chair by description he had a scalp hematoma and subsequently an abscess. He was seen 2 times in the ER most recently on 09/05/2019. I am uncertain whether the hematoma became secondarily infected or the original diagnosis of the hematoma was in error. In any case he has just finished the Keflex he was given in the ER. He set up in the last week it was scabbed and fairly bloody but the scab recently came off. He has a small open area that remains Patient History Information obtained from Patient. Allergies David Davies, David Davies ([id number]) No  Known Drug Allergies Family History Hypertension - Mother,Father, Stroke - Mother, No family history of Cancer, Diabetes, Heart Disease, Kidney Disease, Lung Disease, Seizures, Thyroid Problems, Tuberculosis. Social History Former smoker, Marital Status - Married, Alcohol Use - Never, Drug Use - No History, Caffeine Use - Moderate. Medical History Cardiovascular Patient has history of Hypertension Denies history of Angina, Arrhythmia, Congestive Heart Failure, Coronary Artery Disease, Deep Vein Thrombosis, Hypotension, Myocardial Infarction, Peripheral Arterial Disease, Peripheral Venous Disease, Phlebitis, Vasculitis Endocrine Denies history of Type I Diabetes, Type II Diabetes Integumentary (Skin) Denies history of History of Burn, History of pressure wounds Musculoskeletal Denies history of Gout, Rheumatoid Arthritis, Osteoarthritis, Osteomyelitis Neurologic Patient has history of Neuropathy - feet Denies history of Dementia, Quadriplegia, Paraplegia, Seizure Disorder Hospitalization/Surgery History - November [facility]. Medical And Surgical History Notes Musculoskeletal L BKA Review of Systems (ROS) Constitutional Symptoms (General Health) Denies complaints or symptoms of Fatigue, Fever, Chills, Marked Weight Change. Eyes Denies complaints or symptoms of Dry Eyes, Vision Changes, Glasses / Contacts. Ear/Nose/Mouth/Throat Denies complaints or symptoms of Difficult clearing ears, Sinusitis. Hematologic/Lymphatic Denies complaints or symptoms of Bleeding / Clotting Disorders, Human Immunodeficiency Virus. Respiratory Denies complaints or symptoms of  Chronic or frequent coughs, Shortness of Breath. Cardiovascular Denies complaints or symptoms of Chest pain, LE edema. Gastrointestinal Denies complaints or symptoms of Frequent diarrhea, Nausea, Vomiting. Endocrine Denies complaints or symptoms of Hepatitis, Thyroid disease, Polydypsia (Excessive  Thirst). Genitourinary Denies complaints or symptoms of Kidney failure/ Dialysis, Incontinence/dribbling. Immunological Denies complaints or symptoms of Hives, Itching. Integumentary (Skin) Complains or has symptoms of Wounds. Denies complaints or symptoms of Bleeding or bruising tendency, Breakdown, Swelling. Musculoskeletal Denies complaints or symptoms of Muscle Pain, Muscle Weakness. Neurologic Denies complaints or symptoms of Numbness/parasthesias, Focal/Weakness. Psychiatric Denies complaints or symptoms of Anxiety, Claustrophobia. Objective Constitutional Sitting or standing Blood Pressure is within target range for patient.. David Davies regular and within target range for patient.Marland Kitchen Respirations regular, non- labored and within target range.. Temperature is normal and within the target range for the patient.Marland Kitchen appears in no distress. Vitals Time Taken: 1:00 PM, Height: 65 in, Source: Stated, Weight: 175 lbs, Source: Stated, BMI: 29.1, Temperature: 98.0 F, David Davies: 96 bpm, Respiratory Rate: 16 breaths/min, Blood Pressure: 138/95 mmHg. David Davies, David Davies ([id number]) General Notes: Wound exam; on the occiput of his scalp he has a perfectly circular area with complete absence of any hair growth. In the middle of this is a small open wound. This looks reasonably healthy no debridement was required. There is no evidence of surrounding infection Integumentary (Hair, Skin) Surgical scars on both medial scar on the right leg and there is an area here that is open. I will not define this unless it is worse next week. I asked him to put Polysporin and a Band-Aid on this.. Wound #3 status is Open. Original cause of wound was Trauma. The wound is located on the Head - frontal. The wound measures 0.6cm length x 0.7cm width x 0.1cm depth; 0.33cm^2 area and 0.033cm^3 volume. There is Fat Layer (Subcutaneous Tissue) Exposed exposed. There is no tunneling or undermining noted. There is a medium amount of  serous drainage noted. The wound margin is flat and intact. There is large (67- 100%) pink granulation within the wound bed. There is no necrotic tissue within the wound bed. Assessment Active Problems ICD-10 Unspecified open wound of scalp, subsequent encounter Plan Wound Cleansing: Wound #3 Head - frontal: Clean wound with Normal Saline. Anesthetic (add to Medication List): Wound #3 Head - frontal: Topical Lidocaine 4% cream applied to wound bed prior to debridement (In Clinic Only). Primary Wound Dressing: Wound #3 Head - frontal: Silver Collagen - moistened with hydrogel Secondary Dressing: Other - coverlet to secure Dressing Change Frequency: Wound #3 Head - frontal: Change Dressing Monday, Wednesday, Friday Follow-up Appointments: Wound #3 Head - frontal: Return Appointment in 1 week. 1. There is no evidence of infection currently. He has completed his antibiotics 2. He is also been applying Hibiclens to this in the shower I have asked him to stop this 3. He has had a complete absence of hair growth in a circular area around this area. We will see if he has return of hair growth next week 4. We used plain collagen with a covering strip over this area. This will need to be moistened with K-Y jelly. Hopefully this will close over. 5. No additional antibiotics. #6 I will have another look at the medial surgical scar next week. He was completely noncompliant with his appointments in the clinic. I will act on this if this is not considerably better by next week I spent 25 minutes in review of this patient's records, face-to-face evaluation and preparation of this record  Electronic Signature(s) Signed: 09/14/2019 4:41:32 PM By: Baltazar Najjar MD Entered By: Baltazar Najjar on 09/14/2019 13:35:56 David Davies ([id number]) -------------------------------------------------------------------------------- ROS/PFSH Details Patient Name: David Davies Date of Service: 09/14/2019 1:00  PM Medical Record Number: [id number] Patient Account Number: [id number] Date of Birth/Sex: July 02, 1980 (38 y.o. M) Treating RN: Curtis Sites Primary Care Provider: PATIENT, NO Other Clinician: Referring Provider: Daryel November Treating Provider/Extender: Altamese [place] in Treatment: 0 Information Obtained From Patient Constitutional Symptoms (General Health) Complaints and Symptoms: Negative for: Fatigue; Fever; Chills; Marked Weight Change Eyes Complaints and Symptoms: Negative for: Dry Eyes; Vision Changes; Glasses / Contacts Ear/Nose/Mouth/Throat Complaints and Symptoms: Negative for: Difficult clearing ears; Sinusitis Hematologic/Lymphatic Complaints and Symptoms: Negative for: Bleeding / Clotting Disorders; Human Immunodeficiency Virus Respiratory Complaints and Symptoms: Negative for: Chronic or frequent coughs; Shortness of Breath Cardiovascular Complaints and Symptoms: Negative for: Chest pain; LE edema Medical History: Positive for: Hypertension Negative for: Angina; Arrhythmia; Congestive Heart Failure; Coronary Artery Disease; Deep Vein Thrombosis; Hypotension; Myocardial Infarction; Peripheral Arterial Disease; Peripheral Venous Disease; Phlebitis; Vasculitis Gastrointestinal Complaints and Symptoms: Negative for: Frequent diarrhea; Nausea; Vomiting Endocrine Complaints and Symptoms: Negative for: Hepatitis; Thyroid disease; Polydypsia (Excessive Thirst) Medical History: Negative for: Type I Diabetes; Type II Diabetes Genitourinary Complaints and Symptoms: Negative for: Kidney failure/ Dialysis; Incontinence/dribbling Immunological David Davies, David Davies ([id number]) Complaints and Symptoms: Negative for: Hives; Itching Integumentary (Skin) Complaints and Symptoms: Positive for: Wounds Negative for: Bleeding or bruising tendency; Breakdown; Swelling Medical History: Negative for: History of Burn; History of pressure  wounds Musculoskeletal Complaints and Symptoms: Negative for: Muscle Pain; Muscle Weakness Medical History: Negative for: Gout; Rheumatoid Arthritis; Osteoarthritis; Osteomyelitis Past Medical History Notes: L BKA Neurologic Complaints and Symptoms: Negative for: Numbness/parasthesias; Focal/Weakness Medical History: Positive for: Neuropathy - feet Negative for: Dementia; Quadriplegia; Paraplegia; Seizure Disorder Psychiatric Complaints and Symptoms: Negative for: Anxiety; Claustrophobia Oncologic Immunizations Pneumococcal Vaccine: Received Pneumococcal Vaccination: No Implantable Devices None Hospitalization / Surgery History Type of Hospitalization/Surgery November [facility]ot [facility] Family and Social History Cancer: No; Diabetes: No; Heart Disease: No; Hypertension: Yes - Mother,Father; Kidney Disease: No; Lung Disease: No; Seizures: No; Stroke: Yes - Mother; Thyroid Problems: No; Tuberculosis: No; Former smoker; Marital Status - Married; Alcohol Use: Never; Drug Use: No History; Caffeine Use: Moderate Electronic Signature(s) Signed: 09/14/2019 4:33:52 PM By: Curtis Sites Signed: 09/14/2019 4:41:32 PM By: Baltazar Najjar MD Entered By: Curtis Sites on 09/14/2019 13:08:30 David Davies ([id number]) -------------------------------------------------------------------------------- SuperBill Details Patient Name: David Davies Date of Service: 09/14/2019 Medical Record Number: [id number] Patient Account Number: [id number] Date of Birth/Sex: March 15, 1981 (38 y.o. M) Treating RN: Huel Coventry Primary Care Provider: PATIENT, NO Other Clinician: Referring Provider: Daryel November Treating Provider/Extender: Altamese Brooks in Treatment: 0 Diagnosis Coding ICD-10 Codes Code Description S01.00XD Unspecified open wound of scalp, subsequent encounter Facility Procedures CPT4 Code: 95188416 Description: 99214 - WOUND CARE VISIT-LEV 4 EST PT Modifier: Quantity:  1 Physician Procedures CPT4 Code: 6063016 Description: 99213 - WC PHYS LEVEL 3 - EST PT Modifier: Quantity: 1 CPT4 Code: Description: ICD-10 Diagnosis Description S01.00XD Unspecified open wound of scalp, subsequent encounter Modifier: Quantity: Electronic Signature(s) Signed: 09/15/2019 2:06:24 PM By: Francie Massing Previous Signature: 09/14/2019 4:41:32 PM Version By: Baltazar Najjar MD Entered By: Francie Massing on 09/15/2019 14:06:21

## 2019-09-21 ENCOUNTER — Other Ambulatory Visit: Payer: Self-pay

## 2019-09-21 ENCOUNTER — Encounter: Payer: Medicare Other | Admitting: Internal Medicine

## 2019-09-21 DIAGNOSIS — L98499 Non-pressure chronic ulcer of skin of other sites with unspecified severity: Secondary | ICD-10-CM | POA: Diagnosis not present

## 2019-09-23 NOTE — Progress Notes (Signed)
ARMSTEAD, HEILAND ([id number]) Visit Report for 09/21/2019 Arrival Information Details Patient Name: David Davies, ION Date of Service: 09/21/2019 2:00 PM Medical Record Number: [id number] Patient Account Number: [id number] Date of Birth/Sex: [birth year] (39 y.o. M) Treating RN: Huel Coventry Primary Care Irie Fiorello: PATIENT, NO Other Clinician: Referring Fabrizzio Marcella: Daryel November Treating Aanshi Batchelder/Extender: Altamese [place] in Treatment: 1 Visit Information History Since Last Visit Added or deleted any medications: No Patient Arrived: Ambulatory Any new allergies or adverse reactions: No Arrival Time: 14:06 Had a fall or experienced change in No Accompanied By: self activities of daily living that may affect Transfer Assistance: None risk of falls: Patient Identification Verified: Yes Signs or symptoms of abuse/neglect since last visito No Secondary Verification Process Completed: Yes Hospitalized since last visit: No Patient Has Alerts: Yes Implantable device outside of the clinic excluding No Patient Alerts: Patient on Blood Thinner cellular tissue based products placed in the center Eliquis since last visit: Has Dressing in Place as Prescribed: Yes Pain Present Now: No Electronic Signature(s) Signed: 09/22/2019 11:39:41 AM By: Dayton Martes RCP, RRT, CHT Entered By: Dayton Martes on 09/21/2019 14:06:40 [name] ([id number]) -------------------------------------------------------------------------------- Clinic Level of Care Assessment Details Patient Name: [name] Date of Service: 09/21/2019 2:00 PM Medical Record Number: [id number] Patient Account Number: [id number] Date of Birth/Sex: 01-Oct-1980 (39 y.o. M) Treating RN: Huel Coventry Primary Care Glenford Garis: PATIENT, NO Other Clinician: Referring Akelia Husted: Daryel November Treating Dezeray Puccio/Extender: Altamese  in Treatment: 1 Clinic Level of Care Assessment Items TOOL 4  Quantity Score []  - Use when only an EandM is performed on FOLLOW-UP visit 0 ASSESSMENTS - Nursing Assessment / Reassessment X - Reassessment of Co-morbidities (includes updates in patient status) 1 10 X- 1 5 Reassessment of Adherence to Treatment Plan ASSESSMENTS - Wound and Skin Assessment / Reassessment X - Simple Wound Assessment / Reassessment - one wound 1 5 []  - 0 Complex Wound Assessment / Reassessment - multiple wounds []  - 0 Dermatologic / Skin Assessment (not related to wound area) ASSESSMENTS - Focused Assessment []  - Circumferential Edema Measurements - multi extremities 0 []  - 0 Nutritional Assessment / Counseling / Intervention []  - 0 Lower Extremity Assessment (monofilament, tuning fork, pulses) []  - 0 Peripheral Arterial Disease Assessment (using hand held doppler) ASSESSMENTS - Ostomy and/or Continence Assessment and Care []  - Incontinence Assessment and Management 0 []  - 0 Ostomy Care Assessment and Management (repouching, etc.) PROCESS - Coordination of Care X - Simple Patient / Family Education for ongoing care 1 15 []  - 0 Complex (extensive) Patient / Family Education for ongoing care X- 1 10 Staff obtains , Records, Test Results / Process Orders []  - 0 Staff telephones HHA, Nursing Homes / Clarify orders / etc []  - 0 Routine Transfer to another Facility (non-emergent condition) []  - 0 Routine Hospital Admission (non-emergent condition) []  - 0 New Admissions / / Ordering NPWT, Apligraf, etc. []  - 0 Emergency Hospital Admission (emergent condition) X- 1 10 Simple Discharge Coordination []  - 0 Complex (extensive) Discharge Coordination PROCESS - Special Needs []  - Pediatric / Minor Patient Management 0 []  - 0 Isolation Patient Management []  - 0 Hearing / Language / Visual special needs []  - 0 Assessment of Community assistance (transportation, D/C planning, etc.) []  - 0 Additional assistance / Altered  mentation []  - 0 Support Surface(s) Assessment (bed, cushion, seat, etc.) INTERVENTIONS - Wound Cleansing / Measurement Poma, Alfonsa ( ) X- 1 5 Simple Wound Cleansing - one wound []  - 0 Complex  Wound Cleansing - multiple wounds X- 1 5 Wound Imaging (photographs - any number of wounds) []  - 0 Wound Tracing (instead of photographs) X- 1 5 Simple Wound Measurement - one wound []  - 0 Complex Wound Measurement - multiple wounds INTERVENTIONS - Wound Dressings []  - Small Wound Dressing one or multiple wounds 0 X- 1 15 Medium Wound Dressing one or multiple wounds []  - 0 Large Wound Dressing one or multiple wounds []  - 0 Application of Medications - topical []  - 0 Application of Medications - injection INTERVENTIONS - Miscellaneous []  - External ear exam 0 []  - 0 Specimen Collection (cultures, biopsies, blood, body fluids, etc.) []  - 0 Specimen(s) / Culture(s) sent or taken to Lab for analysis []  - 0 Patient Transfer (multiple staff / / Similar devices) []  - 0 Simple Staple / Suture removal (25 or less) []  - 0 Complex Staple / Suture removal (26 or more) []  - 0 Hypo / Hyperglycemic Management (close monitor of Blood Glucose) []  - 0 Ankle / Brachial Index (ABI) - do not check if billed separately X- 1 5 Vital Signs Has the patient been seen at the hospital within the last three years: Yes Total Score: 90 Level Of Care: New/Established - Level 3 Electronic Signature(s) Signed: 09/23/2019 12:58:54 PM By: , BSN, RN, CWS, Kim RN, BSN Entered By: , BSN, RN, CWS, Kim on 09/21/2019 14:32:49 ( ) -------------------------------------------------------------------------------- Encounter Discharge Information Details Patient Name: Date of Service: 09/21/2019 2:00 PM Medical Record Number: Patient Account Number: Nurse, adult Date of Birth/Sex: October 18, 1980 (38 y.o. M) Treating RN: Primary Care  Taaj Hurlbut: PATIENT, NO Other Clinician: Referring Shevon Sian: Treating Otha Rickles/Extender: in Treatment: 1 Encounter Discharge Information Items Discharge Condition: Stable Ambulatory Status: Ambulatory Discharge Destination: Home Transportation: Private Auto Accompanied By: self Schedule Follow-up Appointment: Yes Clinical Summary of Care: Electronic Signature(s) Signed: 09/23/2019 12:58:54 PM By: Elliot Gurney, BSN, RN, CWS, Kim RN, BSN Entered By: Elliot Gurney, BSN, RN, CWS, Kim on 09/21/2019 14:36:20 [name] ([id number]) -------------------------------------------------------------------------------- Lower Extremity Assessment Details Patient Name: [name] Date of Service: 09/21/2019 2:00 PM Medical Record Number: [id number] Patient Account Number: [id number] Date of Birth/Sex: Nov 02, 1980 (38 y.o. M) Treating RN: Huel Coventry Primary Care Shanitha Twining: PATIENT, NO Other Clinician: Referring Adrien Dietzman: Daryel November Treating Shandell Giovanni/Extender: Altamese Bandon Weeks in Treatment: 1 Electronic Signature(s) Signed: 09/21/2019 4:13:29 PM By: Elliot Gurney Entered By: Elliot Gurney on 09/21/2019 14:13:20 [name] ([id number]) -------------------------------------------------------------------------------- Multi Wound Chart Details Patient Name: [name] Date of Service: 09/21/2019 2:00 PM Medical Record Number: [id number] Patient Account Number: [id number] Date of Birth/Sex: March 24, 1981 (38 y.o. M) Treating RN: Curtis Sites Primary Care Annahi Short: PATIENT, NO Other Clinician: Referring Kherington Meraz: Daryel November Treating Duwayne Matters/Extender: Maxwell Caul in Treatment: 1 Vital Signs Height(in): 65 Pulse(bpm): 78 Weight(lbs): 175 Blood Pressure(mmHg): 135/89 Body Mass Index(BMI): 29 Temperature(F): 97.8 Respiratory Rate(breaths/min): 18 Photos: [N/A:N/A] Wound Location: Head - frontal N/A N/A Wounding Event: Trauma  N/A N/A Primary Etiology: Trauma, Other N/A N/A Comorbid History: Hypertension, Neuropathy N/A N/A Date Acquired: 08/13/2019 N/A N/A Weeks of Treatment: 1 N/A N/A Wound Status: Open N/A N/A Measurements L x W x D (cm) 2x2.6x0.2 N/A N/A Area (cm) : 4.084 N/A N/A Volume (cm) : 0.817 N/A N/A % Reduction in Area: -1137.60% N/A N/A % Reduction in Volume: -2375.80% N/A N/A Classification: Full Thickness Without Exposed N/A N/A Support Structures Exudate Amount: Medium N/A N/A Exudate Type: Serous N/A N/A Exudate Color: amber  N/A N/A Wound Margin: Flat and Intact N/A N/A Granulation Amount: Large (67-100%) N/A N/A Granulation Quality: Red N/A N/A Necrotic Amount: None Present (0%) N/A N/A Exposed Structures: Fat Layer (Subcutaneous Tissue) N/A N/A Exposed: Yes Fascia: No Tendon: No Muscle: No Joint: No Bone: No Epithelialization: Small (1-33%) N/A N/A Debridement: Chemical/Enzymatic/Mechanical N/A N/A Pre-procedure Verification/Time 14:35 N/A N/A Out Taken: Pain Control: Lidocaine N/A N/A Instrument: Other(saline and gauze) N/A N/A Bleeding: Minimum N/A N/A Hemostasis Achieved: Pressure N/A N/A Debridement Treatment Procedure was tolerated well N/A N/A Response: Post Debridement 2x2.6x0.2 N/A N/A Measurements L x W x D (cm) Post Debridement Volume: 0.817 N/A N/A (cm) Procedures Performed: Debridement N/A N/A SAI, ZINN ([id number]) Treatment Notes Wound #3 (Head - frontal) Notes Prisma moistened with hydrogel, coverlet Electronic Signature(s) Signed: 09/21/2019 4:11:49 PM By: Baltazar Najjar MD Previous Signature: 09/21/2019 2:41:36 PM Version By: Elliot Gurney, BSN, RN, CWS, Kim RN, BSN Entered By: Baltazar Najjar on 09/21/2019 14:53:00 [name] ([id number]) -------------------------------------------------------------------------------- Multi-Disciplinary Care Plan Details Patient Name: [name] Date of Service: 09/21/2019 2:00 PM Medical Record Number:  [id number] Patient Account Number: [id number] Date of Birth/Sex: 1981/01/11 (38 y.o. M) Treating RN: Huel Coventry Primary Care Rhina Kramme: PATIENT, NO Other Clinician: Referring Harlene Petralia: Daryel November Treating Suvi Archuletta/Extender: Altamese Zumbrota in Treatment: 1 Active Inactive Orientation to the Wound Care Program Nursing Diagnoses: Knowledge deficit related to the wound healing center program Goals: Patient/caregiver will verbalize understanding of the Wound Healing Center Program Date Initiated: 09/14/2019 Target Resolution Date: 09/21/2019 Goal Status: Active Interventions: Provide education on orientation to the wound center Notes: Soft Tissue Infection Nursing Diagnoses: Impaired tissue integrity Goals: Patient will remain free of wound infection Date Initiated: 09/14/2019 Target Resolution Date: 09/21/2019 Goal Status: Active Interventions: Assess signs and symptoms of infection every visit Notes: Wound/Skin Impairment Nursing Diagnoses: Impaired tissue integrity Goals: Patient/caregiver will verbalize understanding of skin care regimen Date Initiated: 09/14/2019 Target Resolution Date: 09/21/2019 Goal Status: Active Interventions: Provide education on ulcer and skin care Treatment Activities: Skin care regimen initiated : 09/14/2019 Notes: Electronic Signature(s) Signed: 09/21/2019 2:41:13 PM By: Elliot Gurney, BSN, RN, CWS, Kim RN, BSN Entered By: Elliot Gurney, BSN, RN, CWS, Kim on 09/21/2019 14:41:13 [name] ([id number]) -------------------------------------------------------------------------------- Pain Assessment Details Patient Name: [name] Date of Service: 09/21/2019 2:00 PM Medical Record Number: [id number] Patient Account Number: [id number] Date of Birth/Sex: March 11, 1981 (38 y.o. M) Treating RN: Curtis Sites Primary Care Isatou Agredano: PATIENT, NO Other Clinician: Referring Harvir Patry: Daryel November Treating Arta Stump/Extender: Altamese Kimberly in  Treatment: 1 Active Problems Location of Pain Severity and Description of Pain Patient Has Paino Yes Site Locations Pain Location: Pain in Ulcers With Dressing Change: Yes Duration of the Pain. Constant / Intermittento Intermittent Pain Management and Medication Current Pain Management: Notes hurts "a little" Electronic Signature(s) Signed: 09/21/2019 4:13:29 PM By: Curtis Sites Entered By: Curtis Sites on 09/21/2019 14:13:13 [name] ([id number]) -------------------------------------------------------------------------------- Patient/Caregiver Education Details Patient Name: [name] Date of Service: 09/21/2019 2:00 PM Medical Record Number: [id number] Patient Account Number: [id number] Date of Birth/Gender: [birth year] (38 y.o. M) Treating RN: Huel Coventry Primary Care Physician: PATIENT, NO Other Clinician: Referring Physician: Daryel November Treating Physician/Extender: Altamese Rattan in Treatment: 1 Education Assessment Education Provided To: Patient Education Topics Provided Wound/Skin Impairment: Handouts: Caring for Your Ulcer Methods: Demonstration, Explain/Verbal Responses: State content correctly Electronic Signature(s) Signed: 09/23/2019 12:58:54 PM By: Elliot Gurney, BSN, RN, CWS, Kim RN, BSN Entered By: Elliot Gurney, BSN, RN, CWS, Kim on 09/21/2019 14:35:46 [name] ([id number]) -------------------------------------------------------------------------------- Wound Assessment  Details Patient Name: JASSIEL, FLYE Date of Service: 09/21/2019 2:00 PM Medical Record Number: [id number] Patient Account Number: [id number] Date of Birth/Sex: 1980-04-23 (39 y.o. M) Treating RN: Montey Hora Primary Care Clea Dubach: PATIENT, NO Other Clinician: Referring Analeya Luallen: Lenise Arena Treating Robie Mcniel/Extender: Tito Dine in Treatment: 1 Wound Status Wound Number: 3 Primary Etiology: Trauma, Other Wound Location: Head - frontal Wound Status:  Open Wounding Event: Trauma Comorbid History: Hypertension, Neuropathy Date Acquired: 08/13/2019 Weeks Of Treatment: 1 Clustered Wound: No Photos Wound Measurements Length: (cm) 2 Width: (cm) 2.6 Depth: (cm) 0.2 Area: (cm) 4.084 Volume: (cm) 0.817 % Reduction in Area: -1137.6% % Reduction in Volume: -2375.8% Epithelialization: Small (1-33%) Tunneling: No Undermining: No Wound Description Classification: Full Thickness Without Exposed Support Structu Wound Margin: Flat and Intact Exudate Amount: Medium Exudate Type: Serous Exudate Color: amber res Foul Odor After Cleansing: No Slough/Fibrino No Wound Bed Granulation Amount: Large (67-100%) Exposed Structure Granulation Quality: Red Fascia Exposed: No Necrotic Amount: None Present (0%) Fat Layer (Subcutaneous Tissue) Exposed: Yes Tendon Exposed: No Muscle Exposed: No Joint Exposed: No Bone Exposed: No Treatment Notes Wound #3 (Head - frontal) Notes Prisma moistened with hydrogel, coverlet Electronic Signature(s) Signed: 09/21/2019 4:13:29 PM By: Garey Ham, Ruby Cola ([id number]) Entered By: Montey Hora on 09/21/2019 14:17:33 [name] ([id number]) -------------------------------------------------------------------------------- Vitals Details Patient Name: [name] Date of Service: 09/21/2019 2:00 PM Medical Record Number: [id number] Patient Account Number: [id number] Date of Birth/Sex: 02/26/1981 (39 y.o. M) Treating RN: Cornell Barman Primary Care Kaeden Mester: PATIENT, NO Other Clinician: Referring Jc Veron: Lenise Arena Treating Bitha Fauteux/Extender: Tito Dine in Treatment: 1 Vital Signs Time Taken: 14:08 Temperature (F): 97.8 Height (in): 65 Pulse (bpm): 78 Weight (lbs): 175 Respiratory Rate (breaths/min): 18 Body Mass Index (BMI): 29.1 Blood Pressure (mmHg): 135/89 Reference Range: 80 - 120 mg / dl Electronic Signature(s) Signed: 09/22/2019 11:39:41 AM By: Lorine Bears RCP, RRT, CHT Entered By: Lorine Bears on 09/21/2019 14:09:41

## 2019-09-23 NOTE — Progress Notes (Signed)
SYON, TEWS ([id number]) Visit Report for 09/21/2019 Debridement Details Patient Name: David Davies, David Davies Date of Service: 09/21/2019 2:00 PM Medical Record Number: [id number] Patient Account Number: [id number] Date of Birth/Sex: 05/22/1980 (39 y.o. M) Treating RN: Huel Coventry Primary Care Provider: PATIENT, NO Other Clinician: Referring Provider: Daryel November Treating Provider/Extender: Altamese [place] in Treatment: 1 Debridement Performed for Wound #3 Head - frontal Assessment: Performed By: Physician Maxwell Caul, MD Debridement Type: Chemical/Enzymatic/Mechanical Agent Used: saline and gauze Level of Consciousness (Pre- Awake and Alert procedure): Pre-procedure Verification/Time Out Yes - 14:35 Taken: Pain Control: Lidocaine Instrument: Other : saline and gauze Bleeding: Minimum Hemostasis Achieved: Pressure Response to Treatment: Procedure was tolerated well Level of Consciousness (Post- Awake and Alert procedure): Post Debridement Measurements of Total Wound Length: (cm) 2 Width: (cm) 2.6 Depth: (cm) 0.2 Volume: (cm) 0.817 Character of Wound/Ulcer Post Debridement: Stable Post Procedure Diagnosis Same as Pre-procedure Electronic Signature(s) Signed: 09/21/2019 4:11:49 PM By: Baltazar Najjar MD Signed: 09/23/2019 12:58:54 PM By: Elliot Gurney, BSN, RN, CWS, Kim RN, BSN Entered By: Elliot Gurney, BSN, RN, CWS, Kim on 09/21/2019 14:37:48 David Davies ([id number]) -------------------------------------------------------------------------------- HPI Details Patient Name: David Davies Date of Service: 09/21/2019 2:00 PM Medical Record Number: [id number] Patient Account Number: [id number] Date of Birth/Sex: [birth year] (38 y.o. M) Treating RN: Huel Coventry Primary Care Provider: PATIENT, NO Other Clinician: Referring Provider: Daryel November Treating Provider/Extender: Altamese [place] in Treatment: 1 History of Present Illness HPI Description: 05/06/2019 patient  presents today to our clinic with a fairly extensive history which I was able to retrieve in epic. Unfortunately it looks as though beginning on February 15, 2019 the patient did have an initial visit with a podiatrist secondary to what was diagnosed as a fungal foot infection. This was at [facility] he saw Reita Cliche. I actually do not have access to that note incompletion that I can see. Nonetheless the patient subsequently 2 days later ended up going to the hospital through the emergency department where he was admitted at [facility] due to a right foot infection. Looking at the discharge summary he was admitted on 02/17/2019 and was not discharged until 03/05/2019. He has history upon admission was that he had been seen 2 days prior to urgent care and had skin breakdown between his toes. He was given Augmentin and terbinafine at that time. He was instructed to try Epson salt baths but noted extreme worsening in the pain and went to the ED for further evaluation. He had no injury noted. 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 He is on Xarelto for prophylaxis due to thrombosis that he is previously had. 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 He has no history of diabetes. He also states that he quit smoking in 2017. During the hospital admission he denied any use of cocaine or marijuana though there was some question about this as it was reported in the chart. He also denied alcohol use. There was however some initial question as to whether or not his arterial flow was affected on the right. Subsequently he ended up being diagnosed with acute critical limb ischemia in the right lower extremity and had a distal embolectomy on 02/17/2019. This thrombus was in the right anterior tibial artery the patient subsequently ended up having a compartment syndrome and fasciotomy on 02/19/2019. They did end up  repeating a CT angiogram with runoff to ensure  everything was okay due to his severe pain fortunately everything was stable. 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 With that being said he tells me that he is out of his medication right now. It does not look like he has been going to his appointments as directed he has recently moved to [facility]. Subsequently the patient was reevaluated in December on the ninth of 2020. At this point he was again having issues with no evidence of DVT but that was the concern due to his extensive pain in the and they suggested that he follow-up with pain management and this was initiated at that point. Since that time he really has not followed up with anybody from an orthopedic standpoint following the fasciotomy. Upon inspection today the patient does have a history of what appears to be diabetes with a hemoglobin A1c checked in November 2020 to be 8.0, hypertension, peripheral neuropathy, and peripheral vascular disease which seems to be recurrent due to thrombi. Unfortunately right now he tells me he is not taking any of his medicines as he has not been able to get a primary care provider but it looks as if he is also missed several appointments he was supposed to have with multiple providers including hematology. 05/12/2019 upon evaluation today patient appears to be doing better in regard to his wounds. He has been tolerating the dressing changes without complication. Fortunately there is no evidence of active infection at this time. No fevers, chills, nausea, vomiting, or diarrhea. We did get released to see him and continue to take over his care in regard to the wounds currently. The patient had to go to the ER in order to get his medications which included his anticoagulant. Fortunately he has established with a primary care provider now. 05/26/2019 upon evaluation today patient actually  appears to be doing quite well with regard to his wounds. Fortunately there is no signs of active infection at this time. No fevers, chills, nausea, vomiting, or diarrhea. 07/08/2019 upon evaluation today patient appears to be doing little better in regard to his wounds compared to previous evaluations. Fortunately there is no signs of active infection. The wounds do seem to be making progress the medial more so than the lateral. Nonetheless he has been using Hydrofera Blue it has been several weeks in fact February 11 since have last seen him. 4/23; patient's wounds have contracted quite a bit. He says that the area laterally had closed last week but then is recently reopened. He still has a smaller open area on the medial leg wound. These were initially fasciotomy surgical incisions. He is using Hydrofera Blue. His attendance in the clinic has been sporadic. He arrived in clinic yesterday asking for a sent to order him product with opening seen but this was refused. He is also complaining of pain in his leg he feels like there is a deep pain. He saw his primary doctor ordered physical therapy READMISSION 09/14/2019 This is a patient we had in the clinic from 05/06/2019 through 08/09/2019. He had wounds on the lateral and medial part of his right lower extremity which were surgical incision secondary to a fasciotomy he had. He was discharged in a nonhealed state largely because of noncompliance with his clinic visits. Almost a month ago he had a fall off a chair by description he had a scalp hematoma and subsequently an abscess. He was seen 2 times in the ER most recently on  09/05/2019. I am uncertain whether the hematoma became secondarily infected or the original diagnosis of the hematoma was in error. In any case he has just finished the Keflex he was given in the ER. He set up in the last week it was scabbed and fairly bloody but the scab recently came off. He has a small open area that remains 6/9;  the patient comes in today with the wounded area on his scalp about 4 times the size of last week. He says he has been using Hydrofera Blue because he did not have enough collagen. He had Hydrofera Blue left over from leg wounds from which she was in this clinic previously. I asked him last week to stop using Hibiclens he expressed that he had never been using Hibiclens. He is also using Neosporin I asked him to stop using this. Wounded area itself is superficial and does not appear ominous there is nothing that looks like it needs to be biopsied. He is regrowing hair around the wounded area. Electronic Signature(s) Signed: 09/21/2019 4:11:49 PM By: Baltazar Najjar MD Entered By: Baltazar Najjar on 09/21/2019 14:58:03 David Davies, David Davies ([id number]) David Davies, David Davies ([id number]) -------------------------------------------------------------------------------- Physical Exam Details Patient Name: David Davies Date of Service: 09/21/2019 2:00 PM Medical Record Number: [id number] Patient Account Number: [id number] Date of Birth/Sex: [birth year] (38 y.o. M) Treating RN: Huel Coventry Primary Care Provider: PATIENT, NO Other Clinician: Referring Provider: Daryel November Treating Provider/Extender: Altamese [place] in Treatment: 1 Constitutional Sitting or standing Blood Pressure is within target range for patient.. Pulse regular and within target range for patient.Marland Kitchen Respirations regular, non- labored and within target range.. Temperature is normal and within the target range for the patient.Marland Kitchen appears in no distress. Notes Wound exam; the occiput of his scalp. The area seems to have expanded both the wounded area and the loss of hair. The wounded area itself is about 4 times the size although it appears to be healthy and there are rims of epithelialization. Certainly nothing I would consider biopsying or culturing. There is no evidence of surrounding infection. Also the areas of alopecia appear to be  larger. I am not sure I have a wonderful explanation for this. Electronic Signature(s) Signed: 09/21/2019 4:11:49 PM By: Baltazar Najjar MD Entered By: Baltazar Najjar on 09/21/2019 14:59:50 David Davies ([id number]) -------------------------------------------------------------------------------- Physician Orders Details Patient Name: David Davies Date of Service: 09/21/2019 2:00 PM Medical Record Number: [id number] Patient Account Number: [id number] Date of Birth/Sex: 1980/10/26 (38 y.o. M) Treating RN: Huel Coventry Primary Care Provider: PATIENT, NO Other Clinician: Referring Provider: Daryel November Treating Provider/Extender: Altamese  in Treatment: 1 Verbal / Phone Orders: No Diagnosis Coding Wound Cleansing Wound #3 Head - frontal o Clean wound with Normal Saline. Anesthetic (add to Medication List) Wound #3 Head - frontal o Topical Lidocaine 4% cream applied to wound bed prior to debridement (In Clinic Only). Primary Wound Dressing Wound #3 Head - frontal o Silver Collagen - moistened with hydrogel Secondary Dressing o Other - coverlet to secure Dressing Change Frequency Wound #3 Head - frontal o Change Dressing Monday, Wednesday, Friday Follow-up Appointments Wound #3 Head - frontal o Return Appointment in 1 week. Electronic Signature(s) Signed: 09/21/2019 4:11:49 PM By: Baltazar Najjar MD Signed: 09/23/2019 12:58:54 PM By: Elliot Gurney, BSN, RN, CWS, Kim RN, BSN Entered By: Elliot Gurney, BSN, RN, CWS, Kim on 09/21/2019 14:32:21 David Davies, David Davies ([id number]) -------------------------------------------------------------------------------- Problem List Details Patient Name: David Davies Date of Service: 09/21/2019 2:00 PM Medical Record Number: [id number] Patient Account Number: [id number]  Date of Birth/Sex: 23-Sep-1980 (38 y.o. M) Treating RN: Huel Coventry Primary Care Provider: PATIENT, NO Other Clinician: Referring Provider: Daryel November Treating  Provider/Extender: Altamese [place] in Treatment: 1 Active Problems ICD-10 Encounter Code Description Active Date MDM Diagnosis S01.00XD Unspecified open wound of scalp, subsequent encounter 09/14/2019 No Yes Inactive Problems Resolved Problems Electronic Signature(s) Signed: 09/21/2019 4:11:49 PM By: Baltazar Najjar MD Entered By: Baltazar Najjar on 09/21/2019 14:52:47 David Davies ([id number]) -------------------------------------------------------------------------------- Progress Note Details Patient Name: David Davies Date of Service: 09/21/2019 2:00 PM Medical Record Number: [id number] Patient Account Number: [id number] Date of Birth/Sex: 01-25-1981 (38 y.o. M) Treating RN: Huel Coventry Primary Care Provider: PATIENT, NO Other Clinician: Referring Provider: Daryel November Treating Provider/Extender: Altamese [place] in Treatment: 1 Subjective History of Present Illness (HPI) 05/06/2019 patient presents today to our clinic with a fairly extensive history which I was able to retrieve in epic. Unfortunately it looks as though beginning on February 15, 2019 the patient did have an initial visit with a podiatrist secondary to what was diagnosed as a fungal foot infection. This was at [facility] he saw Reita Cliche. I actually do not have access to that note incompletion that I can see. Nonetheless the patient subsequently 2 days later ended up going to the hospital through the emergency department where he was admitted at [facility] due to a right foot infection. Looking at the discharge summary he was admitted on 02/17/2019 and was not discharged until 03/05/2019. He has history upon admission was that he had been seen 2 days prior to urgent care and had skin breakdown between his toes. He was given Augmentin and terbinafine at that time. He was instructed to try Epson salt baths but noted extreme worsening in the pain and went to the ED for further evaluation. He  had no injury noted. 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 He is on Xarelto for prophylaxis due to thrombosis that he is previously had. 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 He has no history of diabetes. He also states that he quit smoking in 2017. During the hospital admission he denied any use of cocaine or marijuana though there was some question about this as it was reported in the chart. He also denied alcohol use. There was however some initial question as to whether or not his arterial flow was affected on the right. Subsequently he ended up being diagnosed with acute critical limb ischemia in the right lower extremity and had a distal embolectomy on 02/17/2019. This thrombus was in the right anterior tibial artery the patient subsequently ended up having a compartment syndrome and fasciotomy on 02/19/2019. They did end up repeating a CT angiogram with runoff to ensure everything was okay due to his severe pain fortunately everything was stable. 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 With that being said he tells me that he is out of his medication right now. It does not look like he has been going to his appointments as directed he has recently moved to [facility]. Subsequently the patient was reevaluated in December on the ninth of 2020. At this point he was again having issues with no evidence of DVT but that was the concern due to his extensive pain in the and they suggested that he follow-up with pain management and this was  initiated at that point. Since that time he really has not followed up with anybody from an orthopedic standpoint following the fasciotomy. Upon inspection today the patient does have a history of what appears to be diabetes with a hemoglobin A1c checked in November 2020 to be 8.0, hypertension,  peripheral neuropathy, and peripheral vascular disease which seems to be recurrent due to thrombi. Unfortunately right now he tells me he is not taking any of his medicines as he has not been able to get a primary care provider but it looks as if he is also missed several appointments he was supposed to have with multiple providers including hematology. 05/12/2019 upon evaluation today patient appears to be doing better in regard to his wounds. He has been tolerating the dressing changes without complication. Fortunately there is no evidence of active infection at this time. No fevers, chills, nausea, vomiting, or diarrhea. We did get released to see him and continue to take over his care in regard to the wounds currently. The patient had to go to the ER in order to get his medications which included his anticoagulant. Fortunately he has established with a primary care provider now. 05/26/2019 upon evaluation today patient actually appears to be doing quite well with regard to his wounds. Fortunately there is no signs of active infection at this time. No fevers, chills, nausea, vomiting, or diarrhea. 07/08/2019 upon evaluation today patient appears to be doing little better in regard to his wounds compared to previous evaluations. Fortunately there is no signs of active infection. The wounds do seem to be making progress the medial more so than the lateral. Nonetheless he has been using Hydrofera Blue it has been several weeks in fact February 11 since have last seen him. 4/23; patient's wounds have contracted quite a bit. He says that the area laterally had closed last week but then is recently reopened. He still has a smaller open area on the medial leg wound. These were initially fasciotomy surgical incisions. He is using Hydrofera Blue. His attendance in the clinic has been sporadic. He arrived in clinic yesterday asking for a sent to order him product with opening seen but this was refused. He is  also complaining of pain in his leg he feels like there is a deep pain. He saw his primary doctor ordered physical therapy READMISSION 09/14/2019 This is a patient we had in the clinic from 05/06/2019 through 08/09/2019. He had wounds on the lateral and medial part of his right lower extremity which were surgical incision secondary to a fasciotomy he had. He was discharged in a nonhealed state largely because of noncompliance with his clinic visits. Almost a month ago he had a fall off a chair by description he had a scalp hematoma and subsequently an abscess. He was seen 2 times in the ER most recently on 09/05/2019. I am uncertain whether the hematoma became secondarily infected or the original diagnosis of the hematoma was in error. In any case he has just finished the Keflex he was given in the ER. He set up in the last week it was scabbed and fairly bloody but the scab recently came off. He has a small open area that remains 6/9; the patient comes in today with the wounded area on his scalp about 4 times the size of last week. He says he has been using Hydrofera Blue because he did not have enough collagen. He had Hydrofera Blue left over from leg wounds from which she was in this  clinic previously. I asked him last week to stop using Hibiclens he expressed that he had never been using Hibiclens. He is also using Neosporin I asked him to stop using this. Wounded area itself is superficial and does not appear ominous there is nothing that looks like it needs to be biopsied. He is regrowing hair around the wounded area. David Davies, David Davies ([id number]) Objective Constitutional Sitting or standing Blood Pressure is within target range for patient.. Pulse regular and within target range for patient.Marland Kitchen Respirations regular, non- labored and within target range.. Temperature is normal and within the target range for the patient.Marland Kitchen appears in no distress. Vitals Time Taken: 2:08 PM, Height: 65 in, Weight: 175  lbs, BMI: 29.1, Temperature: 97.8 F, Pulse: 78 bpm, Respiratory Rate: 18 breaths/min, Blood Pressure: 135/89 mmHg. General Notes: Wound exam; the occiput of his scalp. The area seems to have expanded both the wounded area and the loss of hair. The wounded area itself is about 4 times the size although it appears to be healthy and there are rims of epithelialization. Certainly nothing I would consider biopsying or culturing. There is no evidence of surrounding infection. Also the areas of alopecia appear to be larger. I am not sure I have a wonderful explanation for this. Integumentary (Hair, Skin) Wound #3 status is Open. Original cause of wound was Trauma. The wound is located on the Head - frontal. The wound measures 2cm length x 2.6cm width x 0.2cm depth; 4.084cm^2 area and 0.817cm^3 volume. There is Fat Layer (Subcutaneous Tissue) Exposed exposed. There is no tunneling or undermining noted. There is a medium amount of serous drainage noted. The wound margin is flat and intact. There is large (67- 100%) red granulation within the wound bed. There is no necrotic tissue within the wound bed. Assessment Active Problems ICD-10 Unspecified open wound of scalp, subsequent encounter Procedures Wound #3 Pre-procedure diagnosis of Wound #3 is a Trauma, Other located on the Head - frontal . There was a Chemical/Enzymatic/Mechanical debridement performed by Maxwell Caul, MD. With the following instrument(s): saline and gauze after achieving pain control using Lidocaine. Other agent used was saline and gauze. A time out was conducted at 14:35, prior to the start of the procedure. A Minimum amount of bleeding was controlled with Pressure. The procedure was tolerated well. Post Debridement Measurements: 2cm length x 2.6cm width x 0.2cm depth; 0.817cm^3 volume. Character of Wound/Ulcer Post Debridement is stable. Post procedure Diagnosis Wound #3: Same as Pre-Procedure Plan Wound  Cleansing: Wound #3 Head - frontal: Clean wound with Normal Saline. Anesthetic (add to Medication List): Wound #3 Head - frontal: Topical Lidocaine 4% cream applied to wound bed prior to debridement (In Clinic Only). Primary Wound Dressing: Wound #3 Head - frontal: Silver Collagen - moistened with hydrogel Secondary Dressing: Other - coverlet to secure Dressing Change Frequency: Wound #3 Head - frontal: Change Dressing Monday, Wednesday, Friday Follow-up Appointments: DRAXTON, LUU ([id number]) Wound #3 Head - frontal: Return Appointment in 1 week. 1. I would like to use silver collagen on this area. I told him to stop Hibiclens last week although he denies today ever having said he used Hibiclens. I have also asked him to stop using Neosporin. 2. I have asked him to moisten the collagen with K-Y jelly. 3. I am uncertain whether there is a more obscure dermatologic issue here that I am not thinking about. The original history was trauma with either hematoma with secondary abscess or may be an abscess that did not get  diagnosed. Electronic Signature(s) Signed: 09/21/2019 4:11:49 PM By: Baltazar Najjarobson, Samik Balkcom MD Entered By: Baltazar Najjarobson, Allan Bacigalupi on 09/21/2019 15:01:38 David CarpenLYBURN, David Davies ([id number]) -------------------------------------------------------------------------------- SuperBill Details Patient Name: David CarpenLYBURN, David Davies Date of Service: 09/21/2019 Medical Record Number: [id number] Patient Account Number: [id number] Date of Birth/Sex: 1980/09/21 (38 y.o. M) Treating RN: Huel CoventryWoody, Kim Primary Care Provider: PATIENT, NO Other Clinician: Referring Provider: Daryel NovemberWILLIAMS, JONATHAN Treating Provider/Extender: Altamese CarolinaOBSON, Tiffiny Worthy G Weeks in Treatment: 1 Diagnosis Coding ICD-10 Codes Code Description S01.00XD Unspecified open wound of scalp, subsequent encounter Facility Procedures CPT4 Code: [id number] Description: 99213 - WOUND CARE VISIT-LEV 3 EST PT Modifier: Quantity: 1 Physician Procedures CPT4 Code:  [id number] Description: 99213 - WC PHYS LEVEL 3 - EST PT Modifier: Quantity: 1 CPT4 Code: Description: ICD-10 Diagnosis Description S01.00XD Unspecified open wound of scalp, subsequent encounter Modifier: Quantity: Electronic Signature(s) Signed: 09/21/2019 4:11:49 PM By: Baltazar Najjarobson, Ayaat Jansma MD Entered By: Baltazar Najjarobson, Saunders [place] on 09/21/2019 15:01:57

## 2019-09-27 ENCOUNTER — Ambulatory Visit: Payer: Medicare Other | Attending: Family Medicine | Admitting: Physical Therapy

## 2019-09-27 ENCOUNTER — Other Ambulatory Visit: Payer: Self-pay

## 2019-09-27 ENCOUNTER — Encounter: Payer: Self-pay | Admitting: Physical Therapy

## 2019-09-27 DIAGNOSIS — M6281 Muscle weakness (generalized): Secondary | ICD-10-CM | POA: Diagnosis present

## 2019-09-27 DIAGNOSIS — M25551 Pain in right hip: Secondary | ICD-10-CM | POA: Diagnosis present

## 2019-09-27 DIAGNOSIS — R2689 Other abnormalities of gait and mobility: Secondary | ICD-10-CM | POA: Insufficient documentation

## 2019-09-27 DIAGNOSIS — R262 Difficulty in walking, not elsewhere classified: Secondary | ICD-10-CM

## 2019-09-27 DIAGNOSIS — M79661 Pain in right lower leg: Secondary | ICD-10-CM | POA: Insufficient documentation

## 2019-09-27 NOTE — Therapy (Signed)
[facility] MAIN [facility] SERVICES [address] [place], [place], [address] Phone: [phone]   Fax:  [phone]  Physical Therapy Evaluation  Patient Details  Name: David Davies MRN: [id number] Date of Birth: Oct 25, 1980 Referring Provider (PT): Darreld Mclean   Encounter Date: 09/27/2019   PT End of Session - 09/27/19 1125    Visit Number 1    Number of Visits 17    Date for PT Re-Evaluation 11/22/19    PT Start Time 1110    PT Stop Time 1200    PT Time Calculation (min) 50 min    Equipment Utilized During Treatment Gait belt    Activity Tolerance Patient tolerated treatment well    Behavior During Therapy [facility] for tasks assessed/performed           Past Medical History:  Diagnosis Date  . Chronic back pain   . Chronic leg pain    left  . Chronic pain   . DVT (deep venous thrombosis) (HCC)   . Hypertension   . Left leg paresthesias     Past Surgical History:  Procedure Laterality Date  . FASCIOTOMY    . THROMBECTOMY      There were no vitals filed for this visit.    Subjective Assessment - 09/27/19 1116    Subjective Patient has stiffness in his right hip 5/10, right leg 7/10. Patient has decreased balance.    Pertinent History David Davies is a 39 y.o. male patient  has a hx a wound injury that he received when he fell off a chair in May.   Patient was diagnosed with a scalp hematoma status post CT scan. 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    How long can you stand comfortably? 10 mins    How long can you walk comfortably? 10 mins    Patient Stated Goals to walk better    Currently in Pain? Yes    Pain Score 5     Pain Location Hip    Pain Orientation Right    Pain Descriptors / Indicators Aching    Pain Type Chronic pain     Pain Onset More than a month ago    Pain Frequency Constant    Aggravating Factors  standing    Pain Relieving Factors sitting    Effect of Pain on Daily Activities difficulty to do    Multiple Pain Sites No              [facility] PT Assessment - 09/27/19 1118      Assessment   Medical Diagnosis R Leg pain s/p L BKA s/p R Hip replacement     Referring Provider (PT) Darreld Mclean    Onset Date/Surgical Date 09/26/19    Hand Dominance Left    Prior Therapy no      Precautions   Precautions Fall      Restrictions   Weight Bearing Restrictions No      Balance Screen   Has the patient fallen in the past 6 months Yes    How many times? 2    Has the patient had a decrease in activity level because of a fear of falling?  Yes    Is the patient reluctant to leave their home because of a fear of falling?  No  Home Environment   Living Environment Private residence    Living Arrangements Spouse/significant other    Available Help at Discharge Family    Type of Home House    Home Access Level entry    Home Layout Two level    Alternate Level Stairs-Number of Steps 3    Alternate Level Stairs-Rails Right    Home Equipment Walker - 2 wheels;Shower seat;Wheelchair - manual      Prior Function   Level of Independence Independent with household mobility without device    Vocation On disability    Leisure write      Cognition   Overall Cognitive Status Within Functional Limits for tasks assessed           PAIN: 5/10 right hip, 7/10 right lower leg  POSTURE: WNL   PROM/AROM: WFL BLE  STRENGTH:  Graded on a 0-5 scale Muscle Group Left Right                          Hip Flex 3+/5 3/5  Hip Abd -3/5 3+/5      Hip Ext -3/5 -3/5      Knee Flex 4/5 4/5  Knee Ext 4/5 4/5  Ankle DF - 4/5  Ankle PF - 3+/5   SENSATION: WFL LLE   SPECIAL TESTS: + ELys test Bilateral  FUNCTIONAL MOBILITY: independent, needs Bue for support   BALANCE: Standing Dynamic Balance   Normal Stand independently unsupported, able to weight shift and cross midline maximally   Good Stand independently unsupported, able to weight shift and cross midline moderately   Good-/Fair+ Stand independently unsupported, able to weight shift across midline minimally x  Fair Stand independently unsupported, weight shift, and reach ipsilaterally, loss of balance when crossing midline   Poor+ Able to stand with Min A and reach ipsilaterally, unable to weight shift   Poor Able to stand with Mod A and minimally reach ipsilaterally, unable to cross midline.    Static Standing Balance  Normal Able to maintain standing balance against maximal resistance   Good Able to maintain standing balance against moderate resistance   Good-/Fair+ Able to maintain standing balance against minimal resistance x  Fair Able to stand unsupported without UE support and without LOB for 1-2 min   Fair- Requires Min A and UE support to maintain standing without loss of balance   Poor+ Requires mod A and UE support to maintain standing without loss of balance   Poor Requires max A and UE support to maintain standing balance without loss       GAIT: Patient ambulates with decreased gait speed without AD intermediate and short distances.  OUTCOME MEASURES: TEST Outcome Interpretation  5 times sit<>stand 14.08 sec >60 yo, >15 sec indicates increased risk for falls  10 meter walk test     1.13            m/s <1.0 m/s indicates increased risk for falls; limited community ambulator  Timed up and Go          10.99       sec <14 sec indicates increased risk for falls  6 minute walk test      1050         Feet 1000 feet is community Financial controller deferred <36/56 (100% risk for falls), 37-45 (80% risk for falls); 46-51 (>50% risk for falls); 52-55 (lower risk <25% of falls)  Functional Status Measures: Intake Score Interpretation of FS Scores/Stages Value Patient's Physical FS Primary Measure  38 Patient's intake functional measure is 38 out of 100 (higher number = greater function).This FS measure places the patient in Stage 2 and means the patient has household ambulation. Risk Adjusted Statistical FOTO* 52 Given the patient's risk-adjustment variables, like-patients nationally had a FS score of 52, Stage 3, at             Objective measurements completed on examination: See above findings.               PT Education - 09/27/19 1123    Education Details plan of care    Person(s) Educated Patient    Methods Explanation    Comprehension Verbalized understanding            PT Short Term Goals - 09/27/19 1713      PT SHORT TERM GOAL #1   Title Patient will be independent in home exercise program to improve strength/mobility for better functional independence with ADLs.    Time 4    Period Weeks    Status New    Target Date 10/25/19      PT SHORT TERM GOAL #2   Title Patient (< 10 years old) will complete five times sit to stand test in < 10 seconds indicating an increased LE strength and improved balance.    Time 4    Period Weeks    Status New    Target Date 10/25/19             PT Long Term Goals - 09/27/19 1714      PT LONG TERM GOAL #1   Title Patient will increase six minute walk test distance to >1200 for progression to community ambulator and improve gait ability    Time 8    Period Weeks    Status New    Target Date 11/22/19      PT LONG TERM GOAL #2   Title Patient will reduce timed up and go to <11 seconds to reduce fall risk and demonstrate improved transfer/gait ability.    Time 8    Period Weeks    Status New    Target Date 11/22/19      PT LONG TERM GOAL #3   Title Patient will increase Berg Balance score by > 6 points to demonstrate decreased fall risk during functional activities.    Time 8    Period Weeks    Status New    Target Date 11/22/19      PT LONG TERM GOAL #4   Title Patient will increase FOTO score  to show improved mobility.    Time 8    Period Weeks    Status New    Target Date 11/22/19                  Plan - 09/27/19 1126    Clinical Impression Statement Patient presents with decreased gait speed, decreased balance, and decreased BLE strength. Patient's main complaint is BLE weakness and inability to participate in desired activities. Patient wants to improve his balance and ability to ambulate. Patient will benefit from skilled PT in order to increase gait speed, increase BLE strength, and improve dynamic standing balance to decrease risk for falls and enable patient to participate in desired activities.   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), R acute ischemic limb requiring thrombectomy, c/b compartment syndrome s/p    Examination-Activity Limitations Locomotion Level;Stairs;Stand    Examination-Participation Restrictions Yard Work;Community Activity    Stability/Clinical Decision Making Stable/Uncomplicated    Clinical Decision Making Low    Rehab Potential Good    PT Frequency 2x / week    PT Duration 8 weeks    PT Treatment/Interventions Neuromuscular re-education;Balance training;Therapeutic exercise;Patient/family education;Prosthetic Training;Therapeutic activities;Functional mobility training;Gait training;Stair training;Manual techniques;Dry needling;Joint Manipulations    Consulted and Agree with Plan of Care Patient           Patient will benefit from skilled therapeutic intervention in order to improve the following deficits and impairments:  Abnormal gait, Decreased activity tolerance, Decreased endurance, Decreased range of motion, Decreased strength, Pain, Impaired flexibility, Difficulty walking, Decreased balance  Visit Diagnosis: Difficulty in walking, not elsewhere classified  Muscle weakness  (generalized)  Other abnormalities of gait and mobility  Pain in right hip  Pain in right lower leg     Problem List There are no problems to display for this patient.   [address] [place] DPT 09/27/2019, 5:23 PM  [place] [facility] MAIN [facility] SERVICES [address] [place], [place], [address] Phone: [phone]   Fax:  [phone]  Name: David Davies MRN: [id number] Date of Birth: 04-Jan-1981

## 2019-09-28 ENCOUNTER — Ambulatory Visit: Payer: Medicare Other | Admitting: Internal Medicine

## 2019-09-29 ENCOUNTER — Ambulatory Visit: Payer: Medicare Other | Admitting: Physical Therapy

## 2019-10-04 ENCOUNTER — Ambulatory Visit: Payer: Medicare Other | Admitting: Physical Therapy

## 2019-10-05 ENCOUNTER — Encounter: Payer: Medicare Other | Admitting: Internal Medicine

## 2019-10-05 ENCOUNTER — Other Ambulatory Visit: Payer: Self-pay

## 2019-10-05 DIAGNOSIS — L98499 Non-pressure chronic ulcer of skin of other sites with unspecified severity: Secondary | ICD-10-CM | POA: Diagnosis not present

## 2019-10-05 NOTE — Progress Notes (Signed)
EMRY, BARBATO ([id number]) Visit Report for 10/05/2019 HPI Details Patient Name: David Davies, David Davies Date of Service: 10/05/2019 2:30 PM Medical Record Number: [id number] Patient Account Number: [id number] Date of Birth/Sex: 1980-10-14 (39 y.o. M) Treating RN: Huel Coventry Primary Care Provider: PATIENT, NO Other Clinician: Referring Provider: Daryel November Treating Provider/Extender: Altamese [place] in Treatment: 3 History of Present Illness HPI Description: 05/06/2019 patient presents today to our clinic with a fairly extensive history which I was able to retrieve in epic. Unfortunately it looks as though beginning on February 15, 2019 the patient did have an initial visit with a podiatrist secondary to what was diagnosed as a fungal foot infection. This was at [facility] he saw Reita Cliche. I actually do not have access to that note incompletion that I can see. Nonetheless the patient subsequently 2 days later ended up going to the hospital through the emergency department where he was admitted at [facility] due to a right foot infection. Looking at the discharge summary he was admitted on 02/17/2019 and was not discharged until 03/05/2019. He has history upon admission was that he had been seen 2 days prior to urgent care and had skin breakdown between his toes. He was given Augmentin and terbinafine at that time. He was instructed to try Epson salt baths but noted extreme worsening in the pain and went to the ED for further evaluation. He had no injury noted. 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 He is on Xarelto for prophylaxis due to thrombosis that he is previously had. 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 He has no history of diabetes. He also states that he quit smoking in 2017. During the hospital admission he denied any use of cocaine or marijuana though there was some  question about this as it was reported in the chart. He also denied alcohol use. There was however some initial question as to whether or not his arterial flow was affected on the right. Subsequently he ended up being diagnosed with acute critical limb ischemia in the right lower extremity and had a distal embolectomy on 02/17/2019. This thrombus was in the right anterior tibial artery the patient subsequently ended up having a compartment syndrome and fasciotomy on 02/19/2019. They did end up repeating a CT angiogram with runoff to ensure everything was okay due to his severe pain fortunately everything was stable. 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 With that being said he tells me that he is out of his medication right now. It does not look like he has been going to his appointments as directed he has recently moved to [facility] Subsequently the patient was reevaluated in December on the ninth of 2020. At this point he was again having issues with no evidence of DVT but that was the concern due to his extensive pain in the and they suggested that he follow-up with pain management and this was initiated at that point. Since that time he really has not followed up with anybody from an orthopedic standpoint following the fasciotomy. Upon inspection today the patient does have a history of what appears to be diabetes with a hemoglobin A1c checked in November 2020 to be 8.0, hypertension, peripheral neuropathy, and peripheral vascular disease which seems to be recurrent due to thrombi. Unfortunately right now he tells me he is not  taking any of his medicines as he has not been able to get a primary care provider but it looks as if he is also missed several appointments he was supposed to have with multiple providers including hematology. 05/12/2019 upon evaluation today patient appears to be doing better in regard to his wounds. He  has been tolerating the dressing changes without complication. Fortunately there is no evidence of active infection at this time. No fevers, chills, nausea, vomiting, or diarrhea. We did get released to see him and continue to take over his care in regard to the wounds currently. The patient had to go to the ER in order to get his medications which included his anticoagulant. Fortunately he has established with a primary care provider now. 05/26/2019 upon evaluation today patient actually appears to be doing quite well with regard to his wounds. Fortunately there is no signs of active infection at this time. No fevers, chills, nausea, vomiting, or diarrhea. 07/08/2019 upon evaluation today patient appears to be doing little better in regard to his wounds compared to previous evaluations. Fortunately there is no signs of active infection. The wounds do seem to be making progress the medial more so than the lateral. Nonetheless he has been using Hydrofera Blue it has been several weeks in fact February 11 since have last seen him. 4/23; patient's wounds have contracted quite a bit. He says that the area laterally had closed last week but then is recently reopened. He still has a smaller open area on the medial leg wound. These were initially fasciotomy surgical incisions. He is using Hydrofera Blue. His attendance in the clinic has been sporadic. He arrived in clinic yesterday asking for a sent to order him product with opening seen but this was refused. He is also complaining of pain in his leg he feels like there is a deep pain. He saw his primary doctor ordered physical therapy READMISSION 09/14/2019 This is a patient we had in the clinic from 05/06/2019 through 08/09/2019. He had wounds on the lateral and medial part of his right lower extremity which were surgical incision secondary to a fasciotomy he had. He was discharged in a nonhealed state largely because of noncompliance with his clinic  visits. Almost a month ago he had a fall off a chair by description he had a scalp hematoma and subsequently an abscess. He was seen 2 times in the ER most recently on 09/05/2019. I am uncertain whether the hematoma became secondarily infected or the original diagnosis of the hematoma was in error. In any case he has just finished the Keflex he was given in the ER. He set up in the last week it was scabbed and fairly bloody but the scab recently came off. He has a small open area that remains 6/9; the patient comes in today with the wounded area on his scalp about 4 times the size of last week. He says he has been using Hydrofera Blue because he did not have enough collagen. He had Hydrofera Blue left over from leg wounds from which she was in this clinic previously. I asked him last week to stop using Hibiclens he expressed that he had never been using Hibiclens. He is also using Neosporin I asked him to stop using this. Wounded area itself is superficial and does not appear ominous there is nothing that looks like it needs to be biopsied. He is regrowing hair around the wounded area. 6/23; the patient had missed his appointment last week. He has  been using moistened collagen with K-Y jelly to this area. He had a blister on Colavito, Shervin ([id number]) arrival and one small area of the wound on the scalp and a small open area that still remains open although overall all of this is considerably better. He has had return of pigmentation over the vast majority of the circular area and he is beginning to grow Event organiser) Signed: 10/05/2019 3:45:53 PM By: Baltazar Najjar MD Entered By: Baltazar Najjar on 10/05/2019 15:02:09 [name] ([id number]) -------------------------------------------------------------------------------- Physical Exam Details Patient Name: [name] Date of Service: 10/05/2019 2:30 PM Medical Record Number: [id number] Patient Account Number:  [id number] Date of Birth/Sex: 03-Jun-1980 (39 y.o. M) Treating RN: Huel Coventry Primary Care Provider: PATIENT, NO Other Clinician: Referring Provider: Daryel November Treating Provider/Extender: Altamese [place] in Treatment: 3 Constitutional Patient is hypertensive.. Pulse regular and within target range for patient.Marland Kitchen Respirations regular, non-labored and within target range.. Temperature is normal and within the target range for the patient.Marland Kitchen appears in no distress. Notes Wound exam; this is on the occiput of his scalp. The area in question is a lot better than when he was here 2 weeks ago. Much less of a wound area. He comes in today with a small area with a blister I am not exactly sure how this could have happened. He says he might of scratched the area but even with that I am surprised. His pigmentation is returning in the area and he is beginning to have new hair growth Electronic Signature(s) Signed: 10/05/2019 3:45:53 PM By: Baltazar Najjar MD Entered By: Baltazar Najjar on 10/05/2019 15:03:35 [name] ([id number]) -------------------------------------------------------------------------------- Physician Orders Details Patient Name: [name] Date of Service: 10/05/2019 2:30 PM Medical Record Number: [id number] Patient Account Number: [id number] Date of Birth/Sex: Feb 10, 1981 (39 y.o. M) Treating RN: Huel Coventry Primary Care Provider: PATIENT, NO Other Clinician: Referring Provider: Daryel November Treating Provider/Extender: Altamese [place] in Treatment: 3 Verbal / Phone Orders: No Diagnosis Coding Wound Cleansing Wound #3 Head - frontal o Clean wound with Normal Saline. Anesthetic (add to Medication List) Wound #3 Head - frontal o Topical Lidocaine 4% cream applied to wound bed prior to debridement (In Clinic Only). Primary Wound Dressing Wound #3 Head - frontal o Silver Collagen - moistened with hydrogel Secondary Dressing o Other -  coverlet to secure Dressing Change Frequency Wound #3 Head - frontal o Change Dressing Monday, Wednesday, Friday Follow-up Appointments Wound #3 Head - frontal o Return Appointment in 1 week. Electronic Signature(s) Signed: 10/05/2019 3:45:53 PM By: Baltazar Najjar MD Signed: 10/05/2019 3:55:21 PM By: Elliot Gurney BSN, RN, CWS, Kim RN, BSN Entered By: Elliot Gurney, BSN, RN, CWS, Kim on 10/05/2019 14:45:33 [name] ([id number]) -------------------------------------------------------------------------------- Problem List Details Patient Name: [name] Date of Service: 10/05/2019 2:30 PM Medical Record Number: [id number] Patient Account Number: [id number] Date of Birth/Sex: 1980-05-09 (39 y.o. M) Treating RN: Huel Coventry Primary Care Provider: PATIENT, NO Other Clinician: Referring Provider: Daryel November Treating Provider/Extender: Altamese [place] in Treatment: 3 Active Problems ICD-10 Encounter Code Description Active Date MDM Diagnosis S01.00XD Unspecified open wound of scalp, subsequent encounter 09/14/2019 No Yes Inactive Problems Resolved Problems Electronic Signature(s) Signed: 10/05/2019 3:45:53 PM By: Baltazar Najjar MD Entered By: Baltazar Najjar on 10/05/2019 15:00:34 [name] ([id number]) -------------------------------------------------------------------------------- Progress Note Details Patient Name: [name] Date of Service: 10/05/2019 2:30 PM Medical Record Number: [id number] Patient Account Number: [id number] Date of Birth/Sex: 08/18/1980 (39 y.o. M) Treating RN: Huel Coventry Primary Care Provider: PATIENT, NO  Other Clinician: Referring Provider: Daryel November Treating Provider/Extender: Altamese [place] in Treatment: 3 Subjective History of Present Illness (HPI) 05/06/2019 patient presents today to our clinic with a fairly extensive history which I was able to retrieve in epic. Unfortunately it looks as though beginning on  February 15, 2019 the patient did have an initial visit with a podiatrist secondary to what was diagnosed as a fungal foot infection. This was at [facility] he saw Reita Cliche. I actually do not have access to that note incompletion that I can see. Nonetheless the patient subsequently 2 days later ended up going to the hospital through the emergency department where he was admitted at [facility] due to a right foot infection. Looking at the discharge summary he was admitted on 02/17/2019 and was not discharged until 03/05/2019. He has history upon admission was that he had been seen 2 days prior to urgent care and had skin breakdown between his toes. He was given Augmentin and terbinafine at that time. He was instructed to try Epson salt baths but noted extreme worsening in the pain and went to the ED for further evaluation. He had no injury noted. 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 He is on Xarelto for prophylaxis due to thrombosis that he is previously had. 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 He has no history of diabetes. He also states that he quit smoking in 2017. During the hospital admission he denied any use of cocaine or marijuana though there was some question about this as it was reported in the chart. He also denied alcohol use. There was however some initial question as to whether or not his arterial flow was affected on the right. Subsequently he ended up being diagnosed with acute critical limb ischemia in the right lower extremity and had a distal embolectomy on 02/17/2019. This thrombus was in the right anterior tibial artery the patient subsequently ended up having a compartment syndrome and fasciotomy on 02/19/2019. They did end up repeating a CT angiogram with runoff to ensure everything was okay due to his severe pain fortunately everything was stable. The patient does  have recurrent arterial thrombi and is supposed to be on Eliquis consistently. With that being said he tells me that he is out of his medication right now. It does not look like he has been going to his appointments as directed he has recently moved to [facility]. Subsequently the patient was reevaluated in December on the ninth of 2020. At this point he was again having issues with no evidence of DVT but that was the concern due to his extensive pain in the and they suggested that he follow-up with pain management and this was initiated at that point. Since that time he really has not followed up with anybody from an orthopedic standpoint following the fasciotomy. Upon inspection today the patient does have a history of what appears to be diabetes with a hemoglobin A1c checked in November 2020 to be 8.0, hypertension, peripheral neuropathy, and peripheral vascular disease which seems to be recurrent due to thrombi. Unfortunately right now he tells me he is not taking any of his medicines as he has not been able to get a primary care provider but it looks as if he is also missed several appointments he was supposed to have with multiple providers including hematology. 05/12/2019 upon evaluation today  patient appears to be doing better in regard to his wounds. He has been tolerating the dressing changes without complication. Fortunately there is no evidence of active infection at this time. No fevers, chills, nausea, vomiting, or diarrhea. We did get released to see him and continue to take over his care in regard to the wounds currently. The patient had to go to the ER in order to get his medications which included his anticoagulant. Fortunately he has established with a primary care provider now. 05/26/2019 upon evaluation today patient actually appears to be doing quite well with regard to his wounds. Fortunately there is no signs of active infection at this time. No fevers, chills,  nausea, vomiting, or diarrhea. 07/08/2019 upon evaluation today patient appears to be doing little better in regard to his wounds compared to previous evaluations. Fortunately there is no signs of active infection. The wounds do seem to be making progress the medial more so than the lateral. Nonetheless he has been using Hydrofera Blue it has been several weeks in fact February 11 since have last seen him. 4/23; patient's wounds have contracted quite a bit. He says that the area laterally had closed last week but then is recently reopened. He still has a smaller open area on the medial leg wound. These were initially fasciotomy surgical incisions. He is using Hydrofera Blue. His attendance in the clinic has been sporadic. He arrived in clinic yesterday asking for a sent to order him product with opening seen but this was refused. He is also complaining of pain in his leg he feels like there is a deep pain. He saw his primary doctor ordered physical therapy READMISSION 09/14/2019 This is a patient we had in the clinic from 05/06/2019 through 08/09/2019. He had wounds on the lateral and medial part of his right lower extremity which were surgical incision secondary to a fasciotomy he had. He was discharged in a nonhealed state largely because of noncompliance with his clinic visits. Almost a month ago he had a fall off a chair by description he had a scalp hematoma and subsequently an abscess. He was seen 2 times in the ER most recently on 09/05/2019. I am uncertain whether the hematoma became secondarily infected or the original diagnosis of the hematoma was in error. In any case he has just finished the Keflex he was given in the ER. He set up in the last week it was scabbed and fairly bloody but the scab recently came off. He has a small open area that remains 6/9; the patient comes in today with the wounded area on his scalp about 4 times the size of last week. He says he has been using Hydrofera  Blue because he did not have enough collagen. He had Hydrofera Blue left over from leg wounds from which she was in this clinic previously. I asked him last week to stop using Hibiclens he expressed that he had never been using Hibiclens. He is also using Neosporin I asked him to stop using this. Wounded area itself is superficial and does not appear ominous there is nothing that looks like it needs to be biopsied. He is regrowing hair around the wounded area. 6/23; the patient had missed his appointment last week. He has been using moistened collagen with K-Y jelly to this area. He had a blister on arrival and one small area of the wound on the scalp and a small open area that still remains open although overall all of this is considerably  better. He has had return of pigmentation over the vast majority of the circular area and he is beginning to grow hair Seymour, Graylin ([id number]) Objective Constitutional Patient is hypertensive.. Pulse regular and within target range for patient.Marland Kitchen Respirations regular, non-labored and within target range.. Temperature is normal and within the target range for the patient.Marland Kitchen appears in no distress. Vitals Time Taken: 2:35 PM, Height: 65 in, Weight: 175 lbs, BMI: 29.1, Temperature: 98.6 F, Pulse: 101 bpm, Respiratory Rate: 18 breaths/min, Blood Pressure: 143/91 mmHg. General Notes: Wound exam; this is on the occiput of his scalp. The area in question is a lot better than when he was here 2 weeks ago. Much less of a wound area. He comes in today with a small area with a blister I am not exactly sure how this could have happened. He says he might of scratched the area but even with that I am surprised. His pigmentation is returning in the area and he is beginning to have new hair growth Integumentary (Hair, Skin) Wound #3 status is Open. Original cause of wound was Trauma. The wound is located on the Head - frontal. The wound measures 0.3cm length x 0.3cm width  x 0.1cm depth; 0.071cm^2 area and 0.007cm^3 volume. There is Fat Layer (Subcutaneous Tissue) Exposed exposed. There is a medium amount of serous drainage noted. The wound margin is flat and intact. There is large (67-100%) red granulation within the wound bed. There is no necrotic tissue within the wound bed. Assessment Active Problems ICD-10 Unspecified open wound of scalp, subsequent encounter Plan Wound Cleansing: Wound #3 Head - frontal: Clean wound with Normal Saline. Anesthetic (add to Medication List): Wound #3 Head - frontal: Topical Lidocaine 4% cream applied to wound bed prior to debridement (In Clinic Only). Primary Wound Dressing: Wound #3 Head - frontal: Silver Collagen - moistened with hydrogel Secondary Dressing: Other - coverlet to secure Dressing Change Frequency: Wound #3 Head - frontal: Change Dressing Monday, Wednesday, Friday Follow-up Appointments: Wound #3 Head - frontal: Return Appointment in 1 week. #1 much of the area is healed. He is returning his pigmentation. 2. I never quite understood the history here the although what he described was a hematoma that became secondarily infected. Any degree of inflammation might be enough to explain the hair loss 3. The surprising thing today was the small blister. 4. Nevertheless the entire area including the wound, depigmentation and the localized alopecia all looked a lot better 5. If this was not getting better I plan to send him to dermatology, that at least today does not appear to be necessary SYLAR, VOONG ([id number]) Electronic Signature(s) Signed: 10/05/2019 3:45:53 PM By: Baltazar Najjar MD Entered By: Baltazar Najjar on 10/05/2019 15:05:22 [name] ([id number]) -------------------------------------------------------------------------------- SuperBill Details Patient Name: [name] Date of Service: 10/05/2019 Medical Record Number: [id number] Patient Account Number: [id number] Date of  Birth/Sex: [birth year] (38 y.o. M) Treating RN: Huel Coventry Primary Care Provider: PATIENT, NO Other Clinician: Referring Provider: Daryel November Treating Provider/Extender: Altamese [place] in Treatment: 3 Diagnosis Coding ICD-10 Codes Code Description S01.00XD Unspecified open wound of scalp, subsequent encounter Facility Procedures CPT4 Code: [id number] Description: 99213 - WOUND CARE VISIT-LEV 3 EST PT Modifier: Quantity: 1 Physician Procedures CPT4 Code: [id number] Description: 99213 - WC PHYS LEVEL 3 - EST PT Modifier: Quantity: 1 CPT4 Code: Description: ICD-10 Diagnosis Description S01.00XD Unspecified open wound of scalp, subsequent encounter Modifier: Quantity: Electronic Signature(s) Signed: 10/05/2019 3:45:53 PM By: Baltazar Najjar MD Entered By: Baltazar Najjar on 10/05/2019  15:05:41 °

## 2019-10-06 ENCOUNTER — Ambulatory Visit: Payer: Medicare Other | Admitting: Physical Therapy

## 2019-10-06 NOTE — Progress Notes (Signed)
David Davies, David Davies ([id number]) Visit Report for 10/05/2019 Arrival Information Details Patient Name: David Davies, David Davies Date of Service: 10/05/2019 2:30 PM Medical Record Number: [id number] Patient Account Number: [id number] Date of Birth/Sex: 06-27-1980 (39 y.o. M) Treating RN: Huel Coventry Primary Care Carmine Youngberg: PATIENT, NO Other Clinician: Referring Alexzavier Girardin: Daryel November Treating Nhi Butrum/Extender: Altamese [place] in Treatment: 3 Visit Information History Since Last Visit Added or deleted any medications: No Patient Arrived: Ambulatory Any new allergies or adverse reactions: No Arrival Time: 14:35 Had a fall or experienced change in No Accompanied By: self activities of daily living that may affect Transfer Assistance: None risk of falls: Patient Identification Verified: Yes Signs or symptoms of abuse/neglect since last visito No Secondary Verification Process Completed: Yes Hospitalized since last visit: No Patient Has Alerts: Yes Implantable device outside of the clinic excluding No Patient Alerts: Patient on Blood Thinner cellular tissue based products placed in the center Eliquis since last visit: Has Dressing in Place as Prescribed: Yes Pain Present Now: No Electronic Signature(s) Signed: 10/05/2019 3:28:46 PM By: Dayton Martes RCP, RRT, CHT Entered By: Dayton Martes on 10/05/2019 14:36:36 David Davies ([id number]) -------------------------------------------------------------------------------- Clinic Level of Care Assessment Details Patient Name: David Davies Date of Service: 10/05/2019 2:30 PM Medical Record Number: [id number] Patient Account Number: [id number] Date of Birth/Sex: [birth year] (39 y.o. M) Treating RN: Huel Coventry Primary Care Cantrell Martus: PATIENT, NO Other Clinician: Referring Keauna Brasel: Daryel November Treating Lonnie Rosado/Extender: Altamese [place] in Treatment: 3 Clinic Level of Care Assessment Items TOOL 4  Quantity Score []  - Use when only an EandM is performed on FOLLOW-UP visit 0 ASSESSMENTS - Nursing Assessment / Reassessment X - Reassessment of Co-morbidities (includes updates in patient status) 1 10 X- 1 5 Reassessment of Adherence to Treatment Plan ASSESSMENTS - Wound and Skin Assessment / Reassessment X - Simple Wound Assessment / Reassessment - one wound 1 5 []  - 0 Complex Wound Assessment / Reassessment - multiple wounds []  - 0 Dermatologic / Skin Assessment (not related to wound area) ASSESSMENTS - Focused Assessment []  - Circumferential Edema Measurements - multi extremities 0 []  - 0 Nutritional Assessment / Counseling / Intervention []  - 0 Lower Extremity Assessment (monofilament, tuning fork, pulses) []  - 0 Peripheral Arterial Disease Assessment (using hand held doppler) ASSESSMENTS - Ostomy and/or Continence Assessment and Care []  - Incontinence Assessment and Management 0 []  - 0 Ostomy Care Assessment and Management (repouching, etc.) PROCESS - Coordination of Care X - Simple Patient / Family Education for ongoing care 1 15 []  - 0 Complex (extensive) Patient / Family Education for ongoing care []  - 0 Staff obtains , Records, Test Results / Process Orders []  - 0 Staff telephones HHA, Nursing Homes / Clarify orders / etc []  - 0 Routine Transfer to another Facility (non-emergent condition) []  - 0 Routine Hospital Admission (non-emergent condition) []  - 0 New Admissions / / Ordering NPWT, Apligraf, etc. []  - 0 Emergency Hospital Admission (emergent condition) X- 1 10 Simple Discharge Coordination []  - 0 Complex (extensive) Discharge Coordination PROCESS - Special Needs []  - Pediatric / Minor Patient Management 0 []  - 0 Isolation Patient Management []  - 0 Hearing / Language / Visual special needs []  - 0 Assessment of Community assistance (transportation, D/C planning, etc.) []  - 0 Additional assistance / Altered  mentation []  - 0 Support Surface(s) Assessment (bed, cushion, seat, etc.) INTERVENTIONS - Wound Cleansing / Measurement David Davies, David Davies ( ) X- 1 5 Simple Wound Cleansing - one wound []  - 0 Complex  Wound Cleansing - multiple wounds X- 1 5 Wound Imaging (photographs - any number of wounds) []  - 0 Wound Tracing (instead of photographs) X- 1 5 Simple Wound Measurement - one wound []  - 0 Complex Wound Measurement - multiple wounds INTERVENTIONS - Wound Dressings []  - Small Wound Dressing one or multiple wounds 0 X- 1 15 Medium Wound Dressing one or multiple wounds []  - 0 Large Wound Dressing one or multiple wounds []  - 0 Application of Medications - topical []  - 0 Application of Medications - injection INTERVENTIONS - Miscellaneous []  - External ear exam 0 []  - 0 Specimen Collection (cultures, biopsies, blood, body fluids, etc.) []  - 0 Specimen(s) / Culture(s) sent or taken to Lab for analysis []  - 0 Patient Transfer (multiple staff / / Similar devices) []  - 0 Simple Staple / Suture removal (25 or less) []  - 0 Complex Staple / Suture removal (26 or more) []  - 0 Hypo / Hyperglycemic Management (close monitor of Blood Glucose) []  - 0 Ankle / Brachial Index (ABI) - do not check if billed separately X- 1 5 Vital Signs Has the patient been seen at the hospital within the last three years: Yes Total Score: 80 Level Of Care: New/Established - Level 3 Electronic Signature(s) Signed: 10/05/2019 3:55:21 PM By: , BSN, RN, CWS, Kim RN, BSN Entered By: , BSN, RN, CWS, Kim on 10/05/2019 14:45:53 ( ) -------------------------------------------------------------------------------- Encounter Discharge Information Details Patient Name: Date of Service: 10/05/2019 2:30 PM Medical Record Number: Patient Account Number: Nurse, adult Date of Birth/Sex: Nov 26, 1980 (39 y.o. M) Treating RN: Primary Care  Emry Barbato: PATIENT, NO Other Clinician: Referring Amahri Dengel: Treating Akita Maxim/Extender: 10/07/2019 in Treatment: 3 Encounter Discharge Information Items Discharge Condition: Stable Ambulatory Status: Ambulatory Discharge Destination: Home Transportation: Private Auto Accompanied By: self Schedule Follow-up Appointment: Yes Clinical Summary of Care: Electronic Signature(s) Signed: 10/05/2019 3:55:21 PM By: Elliot Gurney, BSN, RN, CWS, Kim RN, BSN Entered By: 10/07/2019, BSN, RN, CWS, Kim on 10/05/2019 14:46:50 [id number] (David Davies) -------------------------------------------------------------------------------- Lower Extremity Assessment Details Patient Name: 10/07/2019 Date of Service: 10/05/2019 2:30 PM Medical Record Number: [id number] Patient Account Number: 10/02/1980 Date of Birth/Sex: 08/11/1980 (39 y.o. M) Treating RN: Daryel November Primary Care Keilani Terrance: PATIENT, NO Other Clinician: Referring Krysteena Stalker: Altamese [place] Treating Niyam Bisping/Extender: 10/07/2019 Weeks in Treatment: 3 Electronic Signature(s) Signed: 10/06/2019 10:24:12 AM By: Elliot Gurney Entered By: 10/07/2019 on 10/05/2019 14:41:47 [id number] (David Davies) -------------------------------------------------------------------------------- Multi Wound Chart Details Patient Name: 10/07/2019 Date of Service: 10/05/2019 2:30 PM Medical Record Number: [id number] Patient Account Number: 10/02/1980 Date of Birth/Sex: [birth year] (39 y.o. M) Treating RN: Daryel November Primary Care Mckinleigh Schuchart: PATIENT, NO Other Clinician: Referring Zachariah Pavek: Maxwell Caul Treating Saveah Bahar/Extender: 10/08/2019 in Treatment: 3 Vital Signs Height(in): 65 Pulse(bpm): 101 Weight(lbs): 175 Blood Pressure(mmHg): 143/91 Body Mass Index(BMI): 29 Temperature(F): 98.6 Respiratory Rate(breaths/min): 18 Photos: [N/A:N/A] Wound Location: Head - frontal N/A N/A Wounding Event: Trauma N/A  N/A Primary Etiology: Trauma, Other N/A N/A Comorbid History: Hypertension, Neuropathy N/A N/A Date Acquired: 08/13/2019 N/A N/A Weeks of Treatment: 3 N/A N/A Wound Status: Open N/A N/A Measurements L x W x D (cm) 0.3x0.3x0.1 N/A N/A Area (cm) : 0.071 N/A N/A Volume (cm) : 0.007 N/A N/A % Reduction in Area: 78.50% N/A N/A % Reduction in Volume: 78.80% N/A N/A Classification: Full Thickness Without Exposed N/A N/A Support Structures Exudate Amount: Medium N/A N/A Exudate Type: Serous N/A N/A Exudate Color: amber  N/A N/A Wound Margin: Flat and Intact N/A N/A Granulation Amount: Large (67-100%) N/A N/A Granulation Quality: Red N/A N/A Necrotic Amount: None Present (0%) N/A N/A Exposed Structures: Fat Layer (Subcutaneous Tissue) N/A N/A Exposed: Yes Fascia: No Tendon: No Muscle: No Joint: No Bone: No Epithelialization: Small (1-33%) N/A N/A Treatment Notes Wound #3 (Head - frontal) Notes Prisma moistened with hydrogel, coverlet Electronic Signature(s) David Davies, David Davies ([id number]) Signed: 10/05/2019 3:45:53 PM By: Baltazar Najjar MD Entered By: Baltazar Najjar on 10/05/2019 15:00:47 David Davies ([id number]) -------------------------------------------------------------------------------- Multi-Disciplinary Care Plan Details Patient Name: David Davies Date of Service: 10/05/2019 2:30 PM Medical Record Number: [id number] Patient Account Number: [id number] Date of Birth/Sex: [birth year] (39 y.o. M) Treating RN: Huel Coventry Primary Care Johnney Scarlata: PATIENT, NO Other Clinician: Referring Venetia Prewitt: Daryel November Treating San Lohmeyer/Extender: Altamese [place] in Treatment: 3 Active Inactive Orientation to the Wound Care Program Nursing Diagnoses: Knowledge deficit related to the wound healing center program Goals: Patient/caregiver will verbalize understanding of the Wound Healing Center Program Date Initiated: 09/14/2019 Target Resolution Date: 09/21/2019 Goal Status:  Active Interventions: Provide education on orientation to the wound center Notes: Soft Tissue Infection Nursing Diagnoses: Impaired tissue integrity Goals: Patient will remain free of wound infection Date Initiated: 09/14/2019 Target Resolution Date: 09/21/2019 Goal Status: Active Interventions: Assess signs and symptoms of infection every visit Notes: Wound/Skin Impairment Nursing Diagnoses: Impaired tissue integrity Goals: Patient/caregiver will verbalize understanding of skin care regimen Date Initiated: 09/14/2019 Target Resolution Date: 09/21/2019 Goal Status: Active Interventions: Provide education on ulcer and skin care Treatment Activities: Skin care regimen initiated : 09/14/2019 Notes: Electronic Signature(s) Signed: 10/05/2019 3:55:21 PM By: Elliot Gurney, BSN, RN, CWS, Kim RN, BSN Entered By: Elliot Gurney, BSN, RN, CWS, Kim on 10/05/2019 14:44:54 David Davies ([id number]) -------------------------------------------------------------------------------- Pain Assessment Details Patient Name: David Davies Date of Service: 10/05/2019 2:30 PM Medical Record Number: [id number] Patient Account Number: [id number] Date of Birth/Sex: 08-27-1980 (39 y.o. M) Treating RN: Rodell Perna Primary Care Kiley Solimine: PATIENT, NO Other Clinician: Referring Barlow Harrison: Daryel November Treating Shakiya Mcneary/Extender: Altamese [place] in Treatment: 3 Active Problems Location of Pain Severity and Description of Pain Patient Has Paino No Site Locations Pain Management and Medication Current Pain Management: Electronic Signature(s) Signed: 10/06/2019 10:24:12 AM By: Rodell Perna Entered By: Rodell Perna on 10/05/2019 14:39:51 David Davies ([id number]) -------------------------------------------------------------------------------- Patient/Caregiver Education Details Patient Name: David Davies Date of Service: 10/05/2019 2:30 PM Medical Record Number: [id number] Patient Account Number:  [id number] Date of Birth/Gender: [birth year] (39 y.o. M) Treating RN: Huel Coventry Primary Care Physician: PATIENT, NO Other Clinician: Referring Physician: Daryel November Treating Physician/Extender: Altamese [place] in Treatment: 3 Education Assessment Education Provided To: Patient Education Topics Provided Wound/Skin Impairment: Handouts: Caring for Your Ulcer Methods: Demonstration Responses: State content correctly Electronic Signature(s) Signed: 10/05/2019 3:55:21 PM By: Elliot Gurney, BSN, RN, CWS, Kim RN, BSN Entered By: Elliot Gurney, BSN, RN, CWS, Kim on 10/05/2019 14:46:12 David Davies ([id number]) -------------------------------------------------------------------------------- Wound Assessment Details Patient Name: David Davies Date of Service: 10/05/2019 2:30 PM Medical Record Number: [id number] Patient Account Number: [id number] Date of Birth/Sex: Jun 17, 1980 (39 y.o. M) Treating RN: Rodell Perna Primary Care Javelle Donigan: PATIENT, NO Other Clinician: Referring Calla Wedekind: Daryel November Treating Kile Kabler/Extender: Altamese [place] in Treatment: 3 Wound Status Wound Number: 3 Primary Etiology: Trauma, Other Wound Location: Head - frontal Wound Status: Open Wounding Event: Trauma Comorbid History: Hypertension, Neuropathy Date Acquired: 08/13/2019 Weeks Of Treatment: 3 Clustered Wound: No Photos Wound Measurements Length: (cm) 0.3 Width: (cm) 0.3 Depth: (cm) 0.1 Area: (cm) 0.071 Volume: (  cm) 0.007 % Reduction in Area: 78.5% % Reduction in Volume: 78.8% Epithelialization: Small (1-33%) Wound Description Classification: Full Thickness Without Exposed Support Structu Wound Margin: Flat and Intact Exudate Amount: Medium Exudate Type: Serous Exudate Color: amber res Foul Odor After Cleansing: No Slough/Fibrino No Wound Bed Granulation Amount: Large (67-100%) Exposed Structure Granulation Quality: Red Fascia Exposed: No Necrotic Amount: None Present  (0%) Fat Layer (Subcutaneous Tissue) Exposed: Yes Tendon Exposed: No Muscle Exposed: No Joint Exposed: No Bone Exposed: No Treatment Notes Wound #3 (Head - frontal) Notes Prisma moistened with hydrogel, coverlet Electronic Signature(s) Signed: 10/06/2019 10:24:12 AM By: Douglass Rivers, Ruby Cola ([id number]) Entered By: Army Melia on 10/05/2019 14:41:22 David Davies ([id number]) -------------------------------------------------------------------------------- Vitals Details Patient Name: David Davies Date of Service: 10/05/2019 2:30 PM Medical Record Number: [id number] Patient Account Number: [id number] Date of Birth/Sex: David Davies 23, 1982 (39 y.o. M) Treating RN: Cornell Barman Primary Care Brooklynne Pereida: PATIENT, NO Other Clinician: Referring Makesha Belitz: Lenise Arena Treating Derrius Furtick/Extender: Tito Dine in Treatment: 3 Vital Signs Time Taken: 14:35 Temperature (F): 98.6 Height (in): 65 Pulse (bpm): 101 Weight (lbs): 175 Respiratory Rate (breaths/min): 18 Body Mass Index (BMI): 29.1 Blood Pressure (mmHg): 143/91 Reference Range: 80 - 120 mg / dl Electronic Signature(s) Signed: 10/05/2019 3:28:46 PM By: Lorine Bears RCP, RRT, CHT Entered By: Lorine Bears on 10/05/2019 14:37:12

## 2019-10-10 ENCOUNTER — Ambulatory Visit: Payer: Medicare Other | Admitting: Physical Therapy

## 2019-10-12 ENCOUNTER — Encounter: Payer: Medicare Other | Admitting: Physical Therapy

## 2019-10-18 ENCOUNTER — Encounter: Payer: Medicare Other | Admitting: Physical Therapy

## 2019-10-19 ENCOUNTER — Ambulatory Visit: Payer: Medicare Other | Admitting: Internal Medicine

## 2019-10-20 ENCOUNTER — Encounter: Payer: Medicare Other | Admitting: Physical Therapy

## 2019-10-21 ENCOUNTER — Encounter: Payer: Medicare Other | Attending: Physician Assistant | Admitting: Physician Assistant

## 2019-10-21 ENCOUNTER — Other Ambulatory Visit: Payer: Self-pay

## 2019-10-21 DIAGNOSIS — Z7901 Long term (current) use of anticoagulants: Secondary | ICD-10-CM | POA: Insufficient documentation

## 2019-10-21 DIAGNOSIS — I10 Essential (primary) hypertension: Secondary | ICD-10-CM | POA: Insufficient documentation

## 2019-10-21 DIAGNOSIS — Z87891 Personal history of nicotine dependence: Secondary | ICD-10-CM | POA: Diagnosis not present

## 2019-10-21 DIAGNOSIS — L97819 Non-pressure chronic ulcer of other part of right lower leg with unspecified severity: Secondary | ICD-10-CM | POA: Diagnosis present

## 2019-10-21 DIAGNOSIS — Z96641 Presence of right artificial hip joint: Secondary | ICD-10-CM | POA: Insufficient documentation

## 2019-10-21 DIAGNOSIS — L98499 Non-pressure chronic ulcer of skin of other sites with unspecified severity: Secondary | ICD-10-CM | POA: Diagnosis not present

## 2019-10-21 NOTE — Progress Notes (Addendum)
David Davies ([id number]) Visit Report for 10/21/2019 Chief Complaint Document Details Patient Name: David Davies, David Davies Date of Service: 10/21/2019 8:00 AM Medical Record Number: [id number] Patient Account Number: [id number] Date of Birth/Sex: 02/28/1981 (39 y.o. M) Treating RN: Huel Coventry Primary Care Provider: PATIENT, NO Other Clinician: Referring Provider: Daryel November Treating Provider/Extender: Linwood Dibbles, Loralye Loberg Weeks in Treatment: 5 Information Obtained from: Patient Chief Complaint Right LE surgical ulcers 09/14/2019; patient is here for a nonhealing area on the occiput of his scalp Electronic Signature(s) Signed: 10/21/2019 8:36:54 AM By: Lenda Kelp PA-C Entered By: Lenda Kelp on 10/21/2019 08:36:54 David Davies ([id number]) -------------------------------------------------------------------------------- HPI Details Patient Name: David Davies Date of Service: 10/21/2019 8:00 AM Medical Record Number: [id number] Patient Account Number: [id number] Date of Birth/Sex: 10-21-1980 (39 y.o. M) Treating RN: Huel Coventry Primary Care Provider: PATIENT, NO Other Clinician: Referring Provider: Daryel November Treating Provider/Extender: Linwood Dibbles, Jasten Guyette Weeks in Treatment: 5 History of Present Illness HPI Description: 05/06/2019 patient presents today to our clinic with a fairly extensive history which I was able to retrieve in epic. Unfortunately it looks as though beginning on February 15, 2019 the patient did have an initial visit with a podiatrist secondary to what was diagnosed as a fungal foot infection. This was at [facility] he saw Reita Cliche. I actually do not have access to that note incompletion that I can see. Nonetheless the patient subsequently 2 days later ended up going to the hospital through the emergency department where he was admitted at [facility] due to a right foot infection. Looking at the discharge summary he was admitted on 02/17/2019 and was not  discharged until 03/05/2019. He has history upon admission was that he had been seen 2 days prior to urgent care and had skin breakdown between his toes. He was given Augmentin and terbinafine at that time. He was instructed to try Epson salt baths but noted extreme worsening in the pain and went to the ED for further evaluation. He had no injury noted. 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 He is on Xarelto for prophylaxis due to thrombosis that he is previously had. 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 He has no history of diabetes. He also states that he quit smoking in 2017. During the hospital admission he denied any use of cocaine or marijuana though there was some question about this as it was reported in the chart. He also denied alcohol use. There was however some initial question as to whether or not his arterial flow was affected on the right. Subsequently he ended up being diagnosed with acute critical limb ischemia in the right lower extremity and had a distal embolectomy on 02/17/2019. This thrombus was in the right anterior tibial artery the patient subsequently ended up having a compartment syndrome and fasciotomy on 02/19/2019. They did end up repeating a CT angiogram with runoff to ensure everything was okay due to his severe pain fortunately everything was stable. 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 With that being said he tells me that he is out of his medication right now. It does not look like he has been going to his appointments as directed he has recently moved to [facility]. Subsequently the patient was reevaluated in December on the ninth of 2020. At this point he was again having issues with  no evidence of DVT but that was the concern due to his extensive pain in the and they suggested that he  follow-up with pain management and this was initiated at that point. Since that time he really has not followed up with anybody from an orthopedic standpoint following the fasciotomy. Upon inspection today the patient does have a history of what appears to be diabetes with a hemoglobin A1c checked in November 2020 to be 8.0, hypertension, peripheral neuropathy, and peripheral vascular disease which seems to be recurrent due to thrombi. Unfortunately right now he tells me he is not taking any of his medicines as he has not been able to get a primary care provider but it looks as if he is also missed several appointments he was supposed to have with multiple providers including hematology. 05/12/2019 upon evaluation today patient appears to be doing better in regard to his wounds. He has been tolerating the dressing changes without complication. Fortunately there is no evidence of active infection at this time. No fevers, chills, nausea, vomiting, or diarrhea. We did get released to see him and continue to take over his care in regard to the wounds currently. The patient had to go to the ER in order to get his medications which included his anticoagulant. Fortunately he has established with a primary care provider now. 05/26/2019 upon evaluation today patient actually appears to be doing quite well with regard to his wounds. Fortunately there is no signs of active infection at this time. No fevers, chills, nausea, vomiting, or diarrhea. 07/08/2019 upon evaluation today patient appears to be doing little better in regard to his wounds compared to previous evaluations. Fortunately there is no signs of active infection. The wounds do seem to be making progress the medial more so than the lateral. Nonetheless he has been using Hydrofera Blue it has been several weeks in fact February 11 since have last seen him. 4/23; patient's wounds have contracted quite a bit. He says that the area laterally had closed last  week but then is recently reopened. He still has a smaller open area on the medial leg wound. These were initially fasciotomy surgical incisions. He is using Hydrofera Blue. His attendance in the clinic has been sporadic. He arrived in clinic yesterday asking for a sent to order him product with opening seen but this was refused. He is also complaining of pain in his leg he feels like there is a deep pain. He saw his primary doctor ordered physical therapy READMISSION 09/14/2019 This is a patient we had in the clinic from 05/06/2019 through 08/09/2019. He had wounds on the lateral and medial part of his right lower extremity which were surgical incision secondary to a fasciotomy he had. He was discharged in a nonhealed state largely because of noncompliance with his clinic visits. Almost a month ago he had a fall off a chair by description he had a scalp hematoma and subsequently an abscess. He was seen 2 times in the ER most recently on 09/05/2019. I am uncertain whether the hematoma became secondarily infected or the original diagnosis of the hematoma was in error. In any case he has just finished the Keflex he was given in the ER. He set up in the last week it was scabbed and fairly bloody but the scab recently came off. He has a small open area that remains 6/9; the patient comes in today with the wounded area on his scalp about 4 times the size of last week. He says  he has been using Hydrofera Blue because he did not have enough collagen. He had Hydrofera Blue left over from leg wounds from which she was in this clinic previously. I asked him last week to stop using Hibiclens he expressed that he had never been using Hibiclens. He is also using Neosporin I asked him to stop using this. Wounded area itself is superficial and does not appear ominous there is nothing that looks like it needs to be biopsied. He is regrowing hair around the wounded area. 6/23; the patient had missed his appointment last  week. He has been using moistened collagen with K-Y jelly to this area. He had a blister on arrival and one small area of the wound on the scalp and a small open area that still remains open although overall all of this is considerably better. He has had return of pigmentation over the vast majority of the circular area and he is beginning to grow hair 10/21/2019 upon evaluation today patient appears to be doing fairly well in my opinion with regard to his scalp. There is a very small area that almost appears to be a scab but really does not want to come off and compared to last week's picture I feel like this is probably healed. There is Rocks, Beuford ([id number]) no signs of active infection at this time which is great news and no drainage she tells me he really has not had any drainage over the past week at all. Electronic Signature(s) Signed: 10/21/2019 8:45:54 AM By: Lenda Kelp PA-C Entered By: Lenda Kelp on 10/21/2019 08:45:53 ZYSHONNE, MALECHA ([id number]) -------------------------------------------------------------------------------- Physical Exam Details Patient Name: David Davies Date of Service: 10/21/2019 8:00 AM Medical Record Number: [id number] Patient Account Number: [id number] Date of Birth/Sex: [birth year] (39 y.o. M) Treating RN: Huel Coventry Primary Care Provider: PATIENT, NO Other Clinician: Referring Provider: Daryel November Treating Provider/Extender: STONE III, Alaijah Gibler Weeks in Treatment: 5 Constitutional Well-nourished and well-hydrated in no acute distress. Respiratory normal breathing without difficulty. Psychiatric this patient is able to make decisions and demonstrates good insight into disease process. Alert and Oriented x 3. pleasant and cooperative. Notes Upon inspection patient's wound bed actually showed signs of good epithelization at this point I do not really think there is much that may be open and this is just a pinpoint area nonetheless I think  just using a little bit of the K-Y jelly followed by a small bandage just to cover over may help keep that area moist to help this little scab work itself off. Electronic Signature(s) Signed: 10/21/2019 8:46:17 AM By: Lenda Kelp PA-C Entered By: Lenda Kelp on 10/21/2019 08:46:17 CAPERS, HAGMANN ([id number]) -------------------------------------------------------------------------------- Physician Orders Details Patient Name: David Davies Date of Service: 10/21/2019 8:00 AM Medical Record Number: [id number] Patient Account Number: [id number] Date of Birth/Sex: 1980/12/07 (39 y.o. M) Treating RN: Huel Coventry Primary Care Provider: PATIENT, NO Other Clinician: Referring Provider: Daryel November Treating Provider/Extender: Linwood Dibbles, Jahzara Slattery Weeks in Treatment: 5 Verbal / Phone Orders: No Diagnosis Coding ICD-10 Coding Code Description S01.00XD Unspecified open wound of scalp, subsequent encounter Primary Wound Dressing Wound #3 Head - frontal o Other: - hydrogel with coverlet Follow-up Appointments Wound #3 Head - frontal o Return Appointment in 3 weeks. Electronic Signature(s) Signed: 10/21/2019 3:59:28 PM By: Lenda Kelp PA-C Signed: 10/26/2019 6:25:04 PM By: Elliot Gurney, BSN, RN, CWS, Kim RN, BSN Entered By: Elliot Gurney, BSN, RN, CWS, Kim on 10/21/2019 08:45:32 David Davies ([id number]) -------------------------------------------------------------------------------- Problem List Details Patient Name: David Davies  Date of Service: 10/21/2019 8:00 AM Medical Record Number: [id number] Patient Account Number: [id number] Date of Birth/Sex: 05-18-1980 (39 y.o. M) Treating RN: Huel Coventry Primary Care Provider: PATIENT, NO Other Clinician: Referring Provider: Daryel November Treating Provider/Extender: Linwood Dibbles, Tonja Jezewski Weeks in Treatment: 5 Active Problems ICD-10 Encounter Code Description Active Date MDM Diagnosis S01.00XD Unspecified open wound of scalp, subsequent encounter  09/14/2019 No Yes Inactive Problems Resolved Problems Electronic Signature(s) Signed: 10/21/2019 8:36:49 AM By: Lenda Kelp PA-C Entered By: Lenda Kelp on 10/21/2019 08:36:49 OLUWASEUN, CREMER ([id number]) -------------------------------------------------------------------------------- Progress Note Details Patient Name: David Davies Date of Service: 10/21/2019 8:00 AM Medical Record Number: [id number] Patient Account Number: [id number] Date of Birth/Sex: [birth year] (39 y.o. M) Treating RN: Huel Coventry Primary Care Provider: PATIENT, NO Other Clinician: Referring Provider: Daryel November Treating Provider/Extender: Linwood Dibbles, Shanay Woolman Weeks in Treatment: 5 Subjective Chief Complaint Information obtained from Patient Right LE surgical ulcers 09/14/2019; patient is here for a nonhealing area on the occiput of his scalp History of Present Illness (HPI) 05/06/2019 patient presents today to our clinic with a fairly extensive history which I was able to retrieve in epic. Unfortunately it looks as though beginning on February 15, 2019 the patient did have an initial visit with a podiatrist secondary to what was diagnosed as a fungal foot infection. This was at [facility] he saw Reita Cliche. I actually do not have access to that note incompletion that I can see. Nonetheless the patient subsequently 2 days later ended up going to the hospital through the emergency department where he was admitted at [facility] due to a right foot infection. Looking at the discharge summary he was admitted on 02/17/2019 and was not discharged until 03/05/2019. He has history upon admission was that he had been seen 2 days prior to urgent care and had skin breakdown between his toes. He was given Augmentin and terbinafine at that time. He was instructed to try Epson salt baths but noted extreme worsening in the pain and went to the ED for further evaluation. He had no injury noted. Subsequently he has had a right  hip joint replacement in July with [facility]. He is on Xarelto for prophylaxis due to thrombosis that he is previously had. 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 He has no history of diabetes. He also states that he quit smoking in 2017. During the hospital admission he denied any use of cocaine or marijuana though there was some question about this as it was reported in the chart. He also denied alcohol use. There was however some initial question as to whether or not his arterial flow was affected on the right. Subsequently he ended up being diagnosed with acute critical limb ischemia in the right lower extremity and had a distal embolectomy on 02/17/2019. This thrombus was in the right anterior tibial artery the patient subsequently ended up having a compartment syndrome and fasciotomy on 02/19/2019. They did end up repeating a CT angiogram with runoff to ensure everything was okay due to his severe pain fortunately everything was stable. 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 With that being said he tells me that he is out of his medication right now. It does not look like he has been going to his appointments as directed he has recently moved to [facility]. Subsequently the patient was reevaluated in December on the  ninth of 2020. At this point he was again having issues with no evidence of DVT but that was the concern due to his extensive pain in the and they suggested that he follow-up with pain management and this was initiated at that point. Since that time he really has not followed up with anybody from an orthopedic standpoint following the fasciotomy. Upon inspection today the patient does have a history of what appears to be diabetes with a hemoglobin A1c checked in November 2020 to be 8.0, hypertension, peripheral neuropathy, and peripheral  vascular disease which seems to be recurrent due to thrombi. Unfortunately right now he tells me he is not taking any of his medicines as he has not been able to get a primary care provider but it looks as if he is also missed several appointments he was supposed to have with multiple providers including hematology. 05/12/2019 upon evaluation today patient appears to be doing better in regard to his wounds. He has been tolerating the dressing changes without complication. Fortunately there is no evidence of active infection at this time. No fevers, chills, nausea, vomiting, or diarrhea. We did get released to see him and continue to take over his care in regard to the wounds currently. The patient had to go to the ER in order to get his medications which included his anticoagulant. Fortunately he has established with a primary care provider now. 05/26/2019 upon evaluation today patient actually appears to be doing quite well with regard to his wounds. Fortunately there is no signs of active infection at this time. No fevers, chills, nausea, vomiting, or diarrhea. 07/08/2019 upon evaluation today patient appears to be doing little better in regard to his wounds compared to previous evaluations. Fortunately there is no signs of active infection. The wounds do seem to be making progress the medial more so than the lateral. Nonetheless he has been using Hydrofera Blue it has been several weeks in fact February 11 since have last seen him. 4/23; patient's wounds have contracted quite a bit. He says that the area laterally had closed last week but then is recently reopened. He still has a smaller open area on the medial leg wound. These were initially fasciotomy surgical incisions. He is using Hydrofera Blue. His attendance in the clinic has been sporadic. He arrived in clinic yesterday asking for a sent to order him product with opening seen but this was refused. He is also complaining of pain in his leg he  feels like there is a deep pain. He saw his primary doctor ordered physical therapy READMISSION 09/14/2019 This is a patient we had in the clinic from 05/06/2019 through 08/09/2019. He had wounds on the lateral and medial part of his right lower extremity which were surgical incision secondary to a fasciotomy he had. He was discharged in a nonhealed state largely because of noncompliance with his clinic visits. Almost a month ago he had a fall off a chair by description he had a scalp hematoma and subsequently an abscess. He was seen 2 times in the ER most recently on 09/05/2019. I am uncertain whether the hematoma became secondarily infected or the original diagnosis of the hematoma was in error. In any case he has just finished the Keflex he was given in the ER. He set up in the last week it was scabbed and fairly bloody but the scab recently came off. He has a small open area that remains 6/9; the patient comes in today with the wounded area on  his scalp about 4 times the size of last week. He says he has been using Hydrofera Blue because he did not have enough collagen. He had Hydrofera Blue left over from leg wounds from which she was in this clinic previously. I asked him last week to stop using Hibiclens he expressed that he had never been using Hibiclens. He is also using Neosporin I asked him to stop using this. Wounded area itself is superficial and does not appear ominous there is nothing that looks like it needs to be biopsied. He is regrowing hair around the wounded area. 6/23; the patient had missed his appointment last week. He has been using moistened collagen with K-Y jelly to this area. He had a blister on David Davies, David Davies ([id number]) arrival and one small area of the wound on the scalp and a small open area that still remains open although overall all of this is considerably better. He has had return of pigmentation over the vast majority of the circular area and he is beginning to grow  hair 10/21/2019 upon evaluation today patient appears to be doing fairly well in my opinion with regard to his scalp. There is a very small area that almost appears to be a scab but really does not want to come off and compared to last week's picture I feel like this is probably healed. There is no signs of active infection at this time which is great news and no drainage she tells me he really has not had any drainage over the past week at all. Objective Constitutional Well-nourished and well-hydrated in no acute distress. Vitals Time Taken: 8:23 AM, Height: 65 in, Weight: 175 lbs, BMI: 29.1, Temperature: 98.4 F, Pulse: 79 bpm, Respiratory Rate: 16 breaths/min, Blood Pressure: 155/109 mmHg. General Notes: BP Manuel 145/110 Respiratory normal breathing without difficulty. Psychiatric this patient is able to make decisions and demonstrates good insight into disease process. Alert and Oriented x 3. pleasant and cooperative. General Notes: Upon inspection patient's wound bed actually showed signs of good epithelization at this point I do not really think there is much that may be open and this is just a pinpoint area nonetheless I think just using a little bit of the K-Y jelly followed by a small bandage just to cover over may help keep that area moist to help this little scab work itself off. Integumentary (Hair, Skin) Wound #3 status is Open. Original cause of wound was Trauma. The wound is located on the Head - frontal. The wound measures 0.1cm length x 0.1cm width x 0.1cm depth; 0.008cm^2 area and 0.001cm^3 volume. There is Fat Layer (Subcutaneous Tissue) Exposed exposed. There is no tunneling or undermining noted. There is a none present amount of drainage noted. The wound margin is flat and intact. There is no granulation within the wound bed. There is no necrotic tissue within the wound bed. Assessment Active Problems ICD-10 Unspecified open wound of scalp, subsequent  encounter Plan Primary Wound Dressing: Wound #3 Head - frontal: Other: - hydrogel with coverlet Follow-up Appointments: Wound #3 Head - frontal: Return Appointment in 3 weeks. 1. I would recommend against K-Y jelly and just a small bandage over the area for the time being. 2. I am also can recommend the patient continue to monitor for any signs of excessive or worsening in regard to drainage if anything occurs he should let us know. David Davies, David Davies ([id number]) We will see patient back for reevaluation in 3 weeks here in the clinic. If anything worsens or  changes patient will contact our office for additional recommendations. This would just be a final check in order to make sure there is nothing still causing any complications or problems for the patient. Electronic Signature(s) Signed: 10/21/2019 8:46:54 AM By: Lenda Kelp PA-C Entered By: Lenda Kelp on 10/21/2019 08:46:53 YOVAN, LEEMAN ([id number]) -------------------------------------------------------------------------------- SuperBill Details Patient Name: David Davies Date of Service: 10/21/2019 Medical Record Number: [id number] Patient Account Number: [id number] Date of Birth/Sex: [birth year] (39 y.o. M) Treating RN: Huel Coventry Primary Care Provider: PATIENT, NO Other Clinician: Referring Provider: Daryel November Treating Provider/Extender: STONE III, Milicent Acheampong Weeks in Treatment: 5 Diagnosis Coding ICD-10 Codes Code Description S01.00XD Unspecified open wound of scalp, subsequent encounter Facility Procedures CPT4 Code: [id number] Description: [phone] - WOUND CARE VISIT-LEV 2 EST PT Modifier: Quantity: 1 Physician Procedures CPT4 Code: [id number] Description: 99213 - WC PHYS LEVEL 3 - EST PT Modifier: Quantity: 1 CPT4 Code: Description: ICD-10 Diagnosis Description S01.00XD Unspecified open wound of scalp, subsequent encounter Modifier: Quantity: Electronic Signature(s) Signed: 10/21/2019 8:47:02 AM By: Lenda Kelp PA-C Entered By: Lenda Kelp on 10/21/2019 08:47:01

## 2019-10-25 ENCOUNTER — Encounter: Payer: Medicare Other | Admitting: Physical Therapy

## 2019-10-27 ENCOUNTER — Encounter: Payer: Medicare Other | Admitting: Physical Therapy

## 2019-10-27 NOTE — Progress Notes (Addendum)
[name], HEAL ([id number]) Visit Report for 10/21/2019 Arrival Information Details Patient Name: David Davies, David Davies Date of Service: 10/21/2019 8:00 AM Medical Record Number: [id number] Patient Account Number: [id number] Date of Birth/Sex: 16-Nov-1980 (39 y.o. M) Treating RN: Huel Coventry Primary Care Lorn Butcher: PATIENT, NO Other Clinician: Referring Earvin Blazier: Daryel November Treating Schneur Crowson/Extender: Linwood Dibbles, HOYT Weeks in Treatment: 5 Visit Information History Since Last Visit Added or deleted any medications: No Patient Arrived: Ambulatory Any new allergies or adverse reactions: No Arrival Time: 08:22 Had a fall or experienced change in No Accompanied By: self activities of daily living that may affect Transfer Assistance: None risk of falls: Patient Has Alerts: Yes Signs or symptoms of abuse/neglect since last visito No Patient Alerts: Patient on Blood Thinner Has Dressing in Place as Prescribed: No Eliquis Pain Present Now: No Electronic Signature(s) Signed: 10/26/2019 6:25:04 PM By: Elliot Gurney, BSN, RN, CWS, Kim RN, BSN Entered By: Elliot Gurney, BSN, RN, CWS, Kim on 10/21/2019 08:23:11 David Davies Date of Service: 10/21/2019 8:00 AM Medical Record Number: [id number] Patient Account Number: [id number] Date of Birth/Sex: [birth year] (39 y.o. M) Treating RN: Huel Coventry Primary Care Fredonia Casalino: PATIENT, NO Other Clinician: Referring Neshawn Aird: Daryel November Treating Bettie Capistran/Extender: Linwood Dibbles, HOYT Weeks in Treatment: 5 Clinic Level of Care Assessment Items TOOL 4 Quantity Score []  - Use when only an EandM is performed on FOLLOW-UP visit 0 ASSESSMENTS - Nursing Assessment / Reassessment X - Reassessment of Co-morbidities (includes updates in patient status) 1 10 X- 1 5 Reassessment of Adherence to Treatment Plan ASSESSMENTS  - Wound and Skin Assessment / Reassessment X - Simple Wound Assessment / Reassessment - one wound 1 5 []  - 0 Complex Wound Assessment / Reassessment - multiple wounds []  - 0 Dermatologic / Skin Assessment (not related to wound area) ASSESSMENTS - Focused Assessment []  - Circumferential Edema Measurements - multi extremities 0 []  - 0 Nutritional Assessment / Counseling / Intervention []  - 0 Lower Extremity Assessment (monofilament, tuning fork, pulses) []  - 0 Peripheral Arterial Disease Assessment (using hand held doppler) ASSESSMENTS - Ostomy and/or Continence Assessment and Care []  - Incontinence Assessment and Management 0 []  - 0 Ostomy Care Assessment and Management (repouching, etc.) PROCESS - Coordination of Care X - Simple Patient / Family Education for ongoing care 1 15 []  - 0 Complex (extensive) Patient / Family Education for ongoing care []  - 0 Staff obtains , Records, Test Results / Process Orders []  - 0 Staff telephones HHA, Nursing Homes / Clarify orders / etc []  - 0 Routine Transfer to another Facility (non-emergent condition) []  - 0 Routine Hospital Admission (non-emergent condition) []  - 0 New Admissions / / Ordering NPWT, Apligraf, etc. []  - 0 Emergency Hospital Admission (emergent condition) X- 1 10 Simple Discharge Coordination []  - 0 Complex (extensive) Discharge Coordination PROCESS - Special Needs []  - Pediatric / Minor Patient Management 0 []  - 0 Isolation Patient Management []  - 0 Hearing / Language / Visual special needs []  - 0 Assessment of Community assistance (transportation, D/C planning, etc.) []  - 0 Additional assistance / Altered mentation []  - 0 Support Surface(s) Assessment (bed, cushion, seat, etc.) INTERVENTIONS - Wound Cleansing / Measurement Palla, David Davies ( ) X- 1 5 Simple Wound Cleansing - one wound []  - 0 Complex Wound Cleansing - multiple wounds X- 1 5 Wound Imaging (photographs  - any number of wounds) []  - 0 Wound Tracing (instead of photographs) X- 1 5 Simple Wound Measurement -  one wound []  - 0 Complex Wound Measurement - multiple wounds INTERVENTIONS - Wound Dressings X - Small Wound Dressing one or multiple wounds 1 10 []  - 0 Medium Wound Dressing one or multiple wounds []  - 0 Large Wound Dressing one or multiple wounds []  - 0 Application of Medications - topical []  - 0 Application of Medications - injection INTERVENTIONS - Miscellaneous []  - External ear exam 0 []  - 0 Specimen Collection (cultures, biopsies, blood, body fluids, etc.) []  - 0 Specimen(s) / Culture(s) sent or taken to Lab for analysis []  - 0 Patient Transfer (multiple staff / / Similar devices) []  - 0 Simple Staple / Suture removal (25 or less) []  - 0 Complex Staple / Suture removal (26 or more) []  - 0 Hypo / Hyperglycemic Management (close monitor of Blood Glucose) []  - 0 Ankle / Brachial Index (ABI) - do not check if billed separately X- 1 5 Vital Signs Has the patient been seen at the hospital within the last three years: Yes Total Score: 75 Level Of Care: New/Established - Level 2 Electronic Signature(s) Signed: 10/26/2019 6:25:04 PM By: , BSN, RN, CWS, Kim RN, BSN Entered By: , BSN, RN, CWS, Kim on 10/21/2019 08:45:54 ( ) -------------------------------------------------------------------------------- Encounter Discharge Information Details Patient Name: Date of Service: 10/21/2019 8:00 AM Medical Record Number: Nurse, adult Patient Account Number: Date of Birth/Sex: July 17, 1980 (39 y.o. M) Treating RN: Primary Care Tyerra Loretto: PATIENT, NO Other Clinician: Referring Esra Frankowski: 10/28/2019 Treating Nandika Stetzer/Extender: Elliot Gurney, HOYT Weeks in Treatment: 5 Encounter Discharge Information Items Discharge Condition: Unstable Ambulatory Status: Ambulatory Discharge Destination:  Home Transportation: Private Auto Accompanied By: self Schedule Follow-up Appointment: Yes Clinical Summary of Care: Electronic Signature(s) Signed: 10/26/2019 6:25:04 PM By: 12/22/2019, BSN, RN, CWS, Kim RN, BSN Entered By: David Davies, BSN, RN, CWS, Kim on 10/21/2019 08:46:46 David Davies (12/22/2019) -------------------------------------------------------------------------------- Lower Extremity Assessment Details Patient Name: David Davies Date of Service: 10/21/2019 8:00 AM Medical Record Number: 10/02/1980 Patient Account Number: 10-09-1984 Date of Birth/Sex: October 19, 1980 (39 y.o. M) Treating RN: Linwood Dibbles Primary Care Kymiah Araiza: PATIENT, NO Other Clinician: Referring Valerya Maxton: 10/28/2019 Treating Braylynn Ghan/Extender: Elliot Gurney, HOYT Weeks in Treatment: 5 Electronic Signature(s) Signed: 10/26/2019 6:25:04 PM By: 12/22/2019, BSN, RN, CWS, Kim RN, BSN Entered By: David Davies, BSN, RN, CWS, Kim on 10/21/2019 David Davies 12/22/2019 ([id number]) -------------------------------------------------------------------------------- Multi Wound Chart Details Patient Name: [id number] Date of Service: 10/21/2019 8:00 AM Medical Record Number: 10-09-1984 Patient Account Number: Huel Coventry Date of Birth/Sex: 1980/12/29 (39 y.o. M) Treating RN: 10/28/2019 Primary Care Deziyah Arvin: PATIENT, NO Other Clinician: Referring Starlee Corralejo: Elliot Gurney Treating Mesa Janus/Extender: STONE III, HOYT Weeks in Treatment: 5 Vital Signs Height(in): 65 Pulse(bpm): 79 Weight(lbs): 175 Blood Pressure(mmHg): 155/109 Body Mass Index(BMI): 29 Temperature(F): 98.4 Respiratory Rate(breaths/min): 16 Photos: [N/A:N/A] Wound Location: Head - frontal N/A N/A Wounding Event: Trauma N/A N/A Primary Etiology: Trauma, Other N/A N/A Comorbid History: Hypertension, Neuropathy N/A N/A Date Acquired: 08/13/2019 N/A N/A Weeks of Treatment: 5 N/A N/A Wound Status: Open N/A N/A Measurements L x W x D (cm) 0.1x0.1x0.1 N/A N/A Area (cm)  : 0.008 N/A N/A Volume (cm) : 0.001 N/A N/A % Reduction in Area: 97.60% N/A N/A % Reduction in Volume: 97.00% N/A N/A Classification: Full Thickness Without Exposed N/A N/A Support Structures Exudate Amount: None Present N/A N/A Wound Margin: Flat and Intact N/A N/A Granulation Amount: None Present (0%) N/A N/A Necrotic Amount: None Present (0%) N/A N/A Exposed Structures: Fat Layer (Subcutaneous Tissue) N/A N/A  Exposed: Yes Fascia: No Tendon: No Muscle: No Joint: No Bone: No Epithelialization: Large (67-100%) N/A N/A Treatment Notes Electronic Signature(s) Signed: 10/26/2019 6:25:04 PM By: Elliot GurneyWoody, BSN, RN, CWS, Kim RN, BSN Entered By: Elliot GurneyWoody, BSN, RN, CWS, Kim on 10/21/2019 08:42:21 David Davies, David Davies ([id number]) -------------------------------------------------------------------------------- Multi-Disciplinary Care Plan Details Patient Name: David Davies, David Davies Date of Service: 10/21/2019 8:00 AM Medical Record Number: [id number] Patient Account Number: [id number] Date of Birth/Sex: 25-Sep-1980 (39 y.o. M) Treating RN: Huel CoventryWoody, Kim Primary Care Morenike Cuff: PATIENT, NO Other Clinician: Referring Kearra Calkin: Daryel NovemberWILLIAMS, JONATHAN Treating Kysean Sweet/Extender: Linwood DibblesSTONE III, HOYT Weeks in Treatment: 5 Active Inactive Electronic Signature(s) Signed: 11/30/2019 2:49:24 PM By: Elliot GurneyWoody, BSN, RN, CWS, Kim RN, BSN Previous Signature: 10/26/2019 6:25:04 PM Version By: Elliot GurneyWoody, BSN, RN, CWS, Kim RN, BSN Entered By: Elliot GurneyWoody, BSN, RN, CWS, Kim on 11/30/2019 14:49:24 David Davies, David Davies ([id number]) -------------------------------------------------------------------------------- Pain Assessment Details Patient Name: David Davies, David Davies Date of Service: 10/21/2019 8:00 AM Medical Record Number: [id number] Patient Account Number: [id number] Date of Birth/Sex: 25-Sep-1980 (39 y.o. M) Treating RN: Huel CoventryWoody, Kim Primary Care Derryl Uher: PATIENT, NO Other Clinician: Referring Darcell Sabino: Daryel NovemberWILLIAMS, JONATHAN Treating Jaycie Kregel/Extender: Linwood DibblesSTONE III,  HOYT Weeks in Treatment: 5 Active Problems Location of Pain Severity and Description of Pain Patient Has Paino No Site Locations Pain Management and Medication Current Pain Management: Electronic Signature(s) Signed: 10/26/2019 6:25:04 PM By: Elliot GurneyWoody, BSN, RN, CWS, Kim RN, BSN Entered By: Elliot GurneyWoody, BSN, RN, CWS, Kim on 10/21/2019 08:24:37 David Davies, David Davies ([id number]) -------------------------------------------------------------------------------- Patient/Caregiver Education Details Patient Name: David Davies, David Davies Date of Service: 10/21/2019 8:00 AM Medical Record Number: [id number] Patient Account Number: [id number] Date of Birth/Gender: 25-Sep-1980 (39 y.o. M) Treating RN: Huel CoventryWoody, Kim Primary Care Physician: PATIENT, NO Other Clinician: Referring Physician: Daryel NovemberWILLIAMS, JONATHAN Treating Physician/Extender: Skeet SimmerSTONE III, HOYT Weeks in Treatment: 5 Education Assessment Education Provided To: Patient Education Topics Provided Wound/Skin Impairment: Handouts: Caring for Your Ulcer, Other: wound care as prescribed Methods: Demonstration Responses: State content correctly Electronic Signature(s) Signed: 10/26/2019 6:25:04 PM By: Elliot GurneyWoody, BSN, RN, CWS, Kim RN, BSN Entered By: Elliot GurneyWoody, BSN, RN, CWS, Kim on 10/21/2019 08:46:18 David Davies, David Davies ([id number]) -------------------------------------------------------------------------------- Wound Assessment Details Patient Name: David Davies, David Davies Date of Service: 10/21/2019 8:00 AM Medical Record Number: [id number] Patient Account Number: [id number] Date of Birth/Sex: 25-Sep-1980 (39 y.o. M) Treating RN: Huel CoventryWoody, Kim Primary Care Tanee Henery: PATIENT, NO Other Clinician: Referring Martyna Thorns: Daryel NovemberWILLIAMS, JONATHAN Treating Mackinley Kiehn/Extender: Linwood DibblesSTONE III, HOYT Weeks in Treatment: 5 Wound Status Wound Number: 3 Primary Etiology: Trauma, Other Wound Location: Head - frontal Wound Status: Open Wounding Event: Trauma Comorbid History: Hypertension, Neuropathy Date Acquired:  08/13/2019 Weeks Of Treatment: 5 Clustered Wound: No Photos Wound Measurements Length: (cm) 0.1 Width: (cm) 0.1 Depth: (cm) 0.1 Area: (cm) 0.008 Volume: (cm) 0.001 % Reduction in Area: 97.6% % Reduction in Volume: 97% Epithelialization: Large (67-100%) Tunneling: No Undermining: No Wound Description Classification: Full Thickness Without Exposed Support Structure Wound Margin: Flat and Intact Exudate Amount: None Present s Foul Odor After Cleansing: No Slough/Fibrino No Wound Bed Granulation Amount: None Present (0%) Exposed Structure Necrotic Amount: None Present (0%) Fascia Exposed: No Fat Layer (Subcutaneous Tissue) Exposed: Yes Tendon Exposed: No Muscle Exposed: No Joint Exposed: No Bone Exposed: No Electronic Signature(s) Signed: 10/26/2019 6:25:04 PM By: Elliot GurneyWoody, BSN, RN, CWS, Kim RN, BSN Entered By: Elliot GurneyWoody, BSN, RN, CWS, Kim on 10/21/2019 08:28:05 David Davies, David Davies ([id number]) -------------------------------------------------------------------------------- Vitals Details Patient Name: David Davies, David Davies Date of Service: 10/21/2019 8:00 AM Medical Record Number: [id number] Patient Account Number: [id number] Date of Birth/Sex: 25-Sep-1980 (39 y.o. M) Treating RN: Huel CoventryWoody, Kim Primary Care Alfonso Carden:  PATIENT, NO Other Clinician: Referring Minka Knight: Daryel November Treating Jethro Radke/Extender: STONE III, HOYT Weeks in Treatment: 5 Vital Signs Time Taken: 08:23 Temperature (F): 98.4 Height (in): 65 Pulse (bpm): 79 Weight (lbs): 175 Respiratory Rate (breaths/min): 16 Body Mass Index (BMI): 29.1 Blood Pressure (mmHg): 155/109 Reference Range: 80 - 120 mg / dl Notes BP Manuel 952/841 Porsche Noguchi notified. Electronic Signature(s) Signed: 10/26/2019 6:25:04 PM By: Elliot Gurney, BSN, RN, CWS, Kim RN, BSN Entered By: Elliot Gurney, BSN, RN, CWS, Kim on 10/21/2019 08:50:06

## 2019-11-01 ENCOUNTER — Ambulatory Visit: Payer: Medicare Other | Admitting: Physical Therapy

## 2019-11-03 ENCOUNTER — Ambulatory Visit: Payer: Medicare Other | Admitting: Physical Therapy

## 2019-11-08 ENCOUNTER — Ambulatory Visit: Payer: Medicare Other | Admitting: Physical Therapy

## 2019-11-10 ENCOUNTER — Ambulatory Visit: Payer: Medicare Other | Admitting: Physical Therapy

## 2019-11-10 ENCOUNTER — Ambulatory Visit: Payer: Medicare Other | Admitting: Physician Assistant

## 2019-11-11 ENCOUNTER — Encounter: Payer: Medicare Other | Admitting: Physician Assistant

## 2019-12-03 ENCOUNTER — Emergency Department
Admission: EM | Admit: 2019-12-03 | Discharge: 2019-12-03 | Disposition: A | Discharge status: Home / Self Care | Payer: Medicare Other | Attending: Emergency Medicine | Admitting: Emergency Medicine

## 2019-12-03 ENCOUNTER — Emergency Department: Payer: Medicare Other

## 2019-12-03 ENCOUNTER — Encounter: Payer: Self-pay | Admitting: Emergency Medicine

## 2019-12-03 ENCOUNTER — Other Ambulatory Visit: Payer: Self-pay

## 2019-12-03 DIAGNOSIS — Z5321 Procedure and treatment not carried out due to patient leaving prior to being seen by health care provider: Secondary | ICD-10-CM | POA: Insufficient documentation

## 2019-12-03 DIAGNOSIS — R079 Chest pain, unspecified: Secondary | ICD-10-CM | POA: Insufficient documentation

## 2019-12-03 LAB — CBC
HCT: 39.6 % (ref 39.0–52.0)
Hemoglobin: 13.4 g/dL (ref 13.0–17.0)
MCH: 29.5 pg (ref 26.0–34.0)
MCHC: 33.8 g/dL (ref 30.0–36.0)
MCV: 87 fL (ref 80.0–100.0)
Platelets: 215 10*3/uL (ref 150–400)
RBC: 4.55 MIL/uL (ref 4.22–5.81)
RDW: 13.2 % (ref 11.5–15.5)
WBC: 5.9 10*3/uL (ref 4.0–10.5)
nRBC: 0 % (ref 0.0–0.2)

## 2019-12-03 LAB — BASIC METABOLIC PANEL
Anion gap: 11 (ref 5–15)
BUN: 6 mg/dL (ref 6–20)
CO2: 24 mmol/L (ref 22–32)
Calcium: 9.2 mg/dL (ref 8.9–10.3)
Chloride: 102 mmol/L (ref 98–111)
Creatinine, Ser: 1.09 mg/dL (ref 0.61–1.24)
GFR calc Af Amer: >60 mL/min (ref >60)
GFR calc non Af Amer: >60 mL/min (ref >60)
Glucose, Bld: 112 mg/dL — ABNORMAL HIGH (ref 70–99)
Potassium: 3.7 mmol/L (ref 3.5–5.1)
Sodium: 137 mmol/L (ref 135–145)

## 2019-12-03 LAB — TROPONIN I (HIGH SENSITIVITY): Troponin I (High Sensitivity): 19 ng/L — ABNORMAL HIGH (ref <18)

## 2019-12-03 NOTE — ED Notes (Signed)
Pt not here when called for recheck

## 2019-12-03 NOTE — ED Triage Notes (Signed)
First Nurse: patient brought in by ems from home. Patient with complaint of chest pain times two hours. Patient was given 324 mg asa and 3 nitro sprays by ems. Patient's pain decrease from 7 to 4 after nitro. Initial bp 170/110 and down to 130/100 after nitro. Sinus tach on the monitor.

## 2019-12-03 NOTE — ED Triage Notes (Signed)
Pt presents to the ER from home with complaints of chest pain for about 3 hrs, report BP elevated systolic around 168, Pt talks in complete sentences no respiratory distress noted

## 2019-12-03 NOTE — ED Notes (Signed)
Called for revitalize. Not found 

## 2019-12-05 ENCOUNTER — Telehealth: Payer: Self-pay | Admitting: Emergency Medicine

## 2019-12-05 NOTE — Telephone Encounter (Signed)
Called patient due to lwot to inquire about condition and follow up plans. Says he does have pcp at [facility]. I asked him to call and have pcp review his labs and let them know his symptoms.  He grees.

## 2020-02-14 ENCOUNTER — Other Ambulatory Visit: Payer: Self-pay

## 2020-02-14 ENCOUNTER — Ambulatory Visit
Payer: Medicare Other | Attending: Student in an Organized Health Care Education/Training Program | Admitting: Student in an Organized Health Care Education/Training Program

## 2020-02-14 ENCOUNTER — Encounter: Payer: Self-pay | Admitting: Student in an Organized Health Care Education/Training Program

## 2020-02-14 VITALS — BP 147/116 | HR 104 | Temp 97.8°F | Resp 18 | Ht 66.0 in | Wt 170.0 lb

## 2020-02-14 DIAGNOSIS — G546 Phantom limb syndrome with pain: Secondary | ICD-10-CM | POA: Diagnosis present

## 2020-02-14 DIAGNOSIS — M792 Neuralgia and neuritis, unspecified: Secondary | ICD-10-CM | POA: Diagnosis not present

## 2020-02-14 DIAGNOSIS — Z89512 Acquired absence of left leg below knee: Secondary | ICD-10-CM | POA: Diagnosis not present

## 2020-02-14 DIAGNOSIS — G894 Chronic pain syndrome: Secondary | ICD-10-CM | POA: Insufficient documentation

## 2020-02-14 NOTE — Progress Notes (Signed)
Safety precautions to be maintained throughout the outpatient stay will include: orient to surroundings, keep bed in low position, maintain call bell within reach at all times, provide assistance with transfer out of bed and ambulation.  

## 2020-02-14 NOTE — Progress Notes (Signed)
Patient: David Davies  Service Category: E/M  Provider: Gillis Santa, MD  DOB: Dec 15, 1980  DOS: 02/14/2020  Referring Provider: Ranae Plumber, [place]  MRN: [id number]  Setting: Ambulatory outpatient  PCP: [facility]  Type: New Patient  Specialty: Interventional Pain Management    Location: Office  Delivery: Face-to-face     Primary Reason(s) for Visit: Encounter for initial evaluation of one or more chronic problems (new to examiner) potentially causing chronic pain, and posing a threat to normal musculoskeletal function. (Level of risk: High) CC: Leg Pain (left lower(phantom pain))  HPI  [name] is a 39 y.o. year old, male patient, who comes for the first time to our practice referred by Ranae Plumber, [place] for our initial evaluation of his chronic pain. He has Phantom limb pain ([place]); Status post below-knee amputation of left lower extremity ([place]); Neuropathic pain; and Chronic pain syndrome on their problem list. Today he comes in for evaluation of his Leg Pain (left lower(phantom pain))  Pain Assessment: Location: Right, Lower Leg Radiating: radiates from mid calf to foot Onset: More than a month ago Duration: Chronic pain Quality:  (shock, constant) Severity: 7 /10 (subjective, self-reported pain score)  Effect on ADL: limits activities Timing: Intermittent (3-4 times an hour) Modifying factors: lyrica BP:  (states he has stopped taking blood pressure medication.)  HR: (!) 104  Onset and Duration: Gradual and Present longer than 3 months Cause of pain: PVD< BKA left Severity: Getting worse, NAS-11 at its worse: 10/10, NAS-11 at its best: 7/10, NAS-11 now: 7/10 and NAS-11 on the average: 7/10 Timing: Not influenced by the time of the day Aggravating Factors: Bending, Climbing, Kneeling, Lifiting, Motion, Prolonged standing, Squatting, Twisting, Walking, Walking uphill, Walking downhill and Working Alleviating Factors: Resting Associated Problems: Erectile  dysfunction, Numbness and Swelling Quality of Pain: Aching, Agonizing, Annoying, Constant, Deep and Sharp Previous Examinations or Tests: CT scan, MRI scan, Myelogram, Nerve block, Spinal tap, X-rays, Nerve conduction test, Neurological evaluation, Neurosurgical evaluation, Orthopedic evaluation and Psychiatric evaluation Previous Treatments: Narcotic medications, Physical Therapy, Steroid treatments by mouth, Strengthening exercises and Stretching exercises  [name] is a 39 year old male who presents with a chief complaint of left phantom pain, left neuropathic pain of left thigh.  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 Of note patient is anticoagulated on Eliquis 5 mg twice daily.  Given his extensive surgeries over a short period of time, patient states that he was prescribed oxycodone for pain management and that after his surgeries, he was still dependent on them.  For this reason, he was transition to Suboxone and has been on this medication for quite some time.  He states that the Suboxone helps minimize withdrawal symptoms, allows him to sleep and allows him to function but does not necessarily help with his left lower extremity phantom pain.  Of note patient is on high-dose Lyrica 150 mg 3 times a day and is endorsing some side effects with this medication such as decreased sensory perception.  He also does not like the way it makes him feel.  He was on a much lower dose before.  He would like to decrease his Lyrica usage.  He has tried gabapentin, amitriptyline, nortriptyline in the past.  He has also tried various muscle relaxers including Robaxin, tizanidine, Flexeril with limited benefit.  Do not recommend direct acting opioid analgesic therapy.  He has done  physical therapy in the past.  He is also done various spinal injections, nerve blocks with limited benefit.  He does have a prosthesis for his lower extremity in place.  Of note, upon reviewing the patient's prior pain clinic notes, there was an episode in 2018 where the patient presented to the emergency department on his own for chest palpitations.  He states that he had taken a medication given to him by his friend for pain which actually had cocaine in it which was causing his palpitations.  He adamantly states that he does not abuse drugs.   I informed the patient that we will focus on interventional pain management.  He is instructed to continue Suboxone as prescribed by his prescribing provider.  Meds   Current Outpatient Medications:  .  acetaminophen (TYLENOL) 325 MG tablet, Take by mouth., Disp: , Rfl:  .  apixaban (ELIQUIS) 5 MG TABS tablet, Take 1 tablet (5 mg total) by mouth 2 (two) times daily., Disp: 60 tablet, Rfl: 1 .  Buprenorphine HCl-Naloxone HCl 8-2 MG FILM, Place under the tongue., Disp: , Rfl:  .  clotrimazole (LOTRIMIN) 1 % cream, Apply topically., Disp: , Rfl:  .  Multiple Vitamin (MULTI-VITAMIN) tablet, Take by mouth., Disp: , Rfl:  .  pregabalin (LYRICA) 150 MG capsule, Take 150 mg by mouth in the morning, at noon, and at bedtime. , Disp: , Rfl:  .  tiZANidine (ZANAFLEX) 4 MG tablet, Take 4 mg by mouth in the morning and at bedtime. , Disp: , Rfl:  .  metoprolol succinate (TOPROL XL) 25 MG 24 hr tablet, Take 1 tablet (25 mg total) by mouth daily., Disp: 60 tablet, Rfl: 0   ROS  Cardiovascular: No reported cardiovascular signs or symptoms such as High blood pressure, coronary artery disease, abnormal heart rate or rhythm, heart attack, blood thinner therapy or heart weakness and/or failure Pulmonary or Respiratory: No reported pulmonary signs or symptoms such as wheezing and difficulty taking a deep full breath (Asthma), difficulty blowing air out (Emphysema),  coughing up mucus (Bronchitis), persistent dry cough, or temporary stoppage of breathing during sleep Neurological: No reported neurological signs or symptoms such as seizures, abnormal skin sensations, urinary and/or fecal incontinence, being born with an abnormal open spine and/or a tethered spinal cord Psychological-Psychiatric: No reported psychological or psychiatric signs or symptoms such as difficulty sleeping, anxiety, depression, delusions or hallucinations (schizophrenial), mood swings (bipolar disorders) or suicidal ideations or attempts Gastrointestinal: No reported gastrointestinal signs or symptoms such as vomiting or evacuating blood, reflux, heartburn, alternating episodes of diarrhea and constipation, inflamed or scarred liver, or pancreas or irrregular and/or infrequent bowel movements Genitourinary: No reported renal or genitourinary signs or symptoms such as difficulty voiding or producing urine, peeing blood, non-functioning kidney, kidney stones, difficulty emptying the bladder, difficulty controlling the flow of urine, or chronic kidney disease Hematological: No reported hematological signs or symptoms such as prolonged bleeding, low or poor functioning platelets, bruising or bleeding easily, hereditary bleeding problems, low energy levels due to low hemoglobin or being anemic Endocrine: No reported endocrine signs or symptoms such as high or low blood sugar, rapid heart rate due to high thyroid levels, obesity or weight gain due to slow thyroid or thyroid disease Rheumatologic: No reported rheumatological signs and symptoms such as fatigue, joint pain, tenderness, swelling, redness, heat, stiffness, decreased range of motion, with or without associated rash Musculoskeletal: Negative for myasthenia gravis, muscular dystrophy,  multiple sclerosis or malignant hyperthermia Work History: Disabled  Allergies  Mr. Vieth has No Known Allergies.  Laboratory Chemistry Profile    Renal Lab Results  Component Value Date   BUN 6 12/03/2019   CREATININE 1.09 12/03/2019   GFR 133.59 08/22/2014   GFRAA >60 12/03/2019   GFRNONAA >60 12/03/2019   PROTEINUR NEGATIVE 12/15/2014     Electrolytes Lab Results  Component Value Date   NA 137 12/03/2019   K 3.7 12/03/2019   CL 102 12/03/2019   CALCIUM 9.2 12/03/2019     Hepatic Lab Results  Component Value Date   AST 52 (H) 06/05/2019   ALT 33 06/05/2019   ALBUMIN 3.5 06/05/2019   ALKPHOS 67 06/05/2019   LIPASE 11 (L) 12/15/2014     ID No results found for: LYMEIGGIGMAB, HIV, SARSCOV2NAA, STAPHAUREUS, MRSAPCR, HCVAB, PREGTESTUR, RMSFIGG, QFVRPH1IGG, QFVRPH2IGG, LYMEIGGIGMAB   Bone No results found for: VD25OH, H139778, HU3149FW2, OV7858IF0, 25OHVITD1, 25OHVITD2, 25OHVITD3, TESTOFREE, TESTOSTERONE   Endocrine Lab Results  Component Value Date   GLUCOSE 112 (H) 12/03/2019   GLUCOSEU NEGATIVE 12/15/2014   TSH 2.09 08/28/2014   FREET4 0.62 08/28/2014     Neuropathy Lab Results  Component Value Date   VITAMINB12 684 08/28/2014     CNS No results found for: COLORCSF, APPEARCSF, RBCCOUNTCSF, WBCCSF, POLYSCSF, LYMPHSCSF, EOSCSF, PROTEINCSF, GLUCCSF, JCVIRUS, CSFOLI, IGGCSF, LABACHR, ACETBL, LABACHR, ACETBL   Inflammation (CRP: Acute  ESR: Chronic) Lab Results  Component Value Date   LATICACIDVEN 1.4 12/15/2014     Rheumatology No results found for: RF, ANA, LABURIC, URICUR, LYMEIGGIGMAB, LYMEABIGMQN, HLAB27   Coagulation Lab Results  Component Value Date   INR 1.1 09/13/2019   LABPROT 13.8 09/13/2019   APTT 35 09/13/2019   PLT 215 12/03/2019     Cardiovascular Lab Results  Component Value Date   BNP 51.0 05/07/2019   CKTOTAL 404 (H) 08/22/2014   HGB 13.4 12/03/2019   HCT 39.6 12/03/2019     Screening No results found for: SARSCOV2NAA, COVIDSOURCE, STAPHAUREUS, MRSAPCR, HCVAB, HIV, PREGTESTUR   Cancer No results found for: CEA, CA125, LABCA2   Allergens No results found for:  ALMOND, APPLE, ASPARAGUS, AVOCADO, BANANA, BARLEY, BASIL, BAYLEAF, GREENBEAN, LIMABEAN, WHITEBEAN, BEEFIGE, REDBEET, BLUEBERRY, BROCCOLI, CABBAGE, MELON, CARROT, CASEIN, CASHEWNUT, CAULIFLOWER, CELERY     Note: Lab results reviewed.  PFSH  Drug: Mr. Massman  reports no history of drug use. Alcohol:  reports no history of alcohol use. Tobacco:  reports that he has never smoked. He has never used smokeless tobacco. Medical:  has a past medical history of Chronic back pain, Chronic leg pain, Chronic pain, DVT (deep venous thrombosis) ([place]), Hypertension, and Left leg paresthesias. Family: family history is not on file.  Past Surgical History:  Procedure Laterality Date  . FASCIOTOMY    . left bka    . THROMBECTOMY     Active Ambulatory Problems    Diagnosis Date Noted  . Phantom limb pain ([place]) 02/14/2020  . Status post below-knee amputation of left lower extremity ([place]) 02/14/2020  . Neuropathic pain 02/14/2020  . Chronic pain syndrome 02/14/2020   Resolved Ambulatory Problems    Diagnosis Date Noted  . Non-compliant behavior 08/22/2014   Past Medical History:  Diagnosis Date  . Chronic back pain   . Chronic leg pain   . Chronic pain   . DVT (deep venous thrombosis) ([place])   . Hypertension   . Left leg paresthesias    Constitutional Exam  General appearance: Well nourished, well developed,  and well hydrated. In no apparent acute distress Vitals:   02/14/20 1419  BP: (!) 147/116  Pulse: (!) 104  Resp: 18  Temp: 97.8 F (36.6 C)  SpO2: 99%  Weight: 170 lb (77.1 kg)  Height: 5' 6"  (1.676 m)   BMI Assessment: Estimated body mass index is 27.44 kg/m as calculated from the following:   Height as of this encounter: 5' 6"  (1.676 m).   Weight as of this encounter: 170 lb (77.1 kg).  BMI interpretation table: BMI level Category Range association with higher incidence of chronic pain  <18 kg/m2 Underweight   18.5-24.9 kg/m2 Ideal body weight   25-29.9 kg/m2 Overweight  Increased incidence by 20%  30-34.9 kg/m2 Obese (Class I) Increased incidence by 68%  35-39.9 kg/m2 Severe obesity (Class II) Increased incidence by 136%  >40 kg/m2 Extreme obesity (Class III) Increased incidence by 254%   Patient's current BMI Ideal Body weight  Body mass index is 27.44 kg/m. Ideal body weight: 63.8 kg (140 lb 10.5 oz) Adjusted ideal body weight: 69.1 kg (152 lb 6.3 oz)   BMI Readings from Last 4 Encounters:  02/14/20 27.44 kg/m  09/13/19 29.12 kg/m  08/26/19 30.79 kg/m  08/06/19 32.95 kg/m   Wt Readings from Last 4 Encounters:  02/14/20 170 lb (77.1 kg)  09/13/19 175 lb (79.4 kg)  08/26/19 185 lb (83.9 kg)  08/06/19 198 lb (89.8 kg)    Psych/Mental status: Alert, oriented x 3 (person, place, & time)       Eyes: PERLA Respiratory: No evidence of acute respiratory distress  Cervical Spine Exam  Skin & Axial Inspection: No masses, redness, edema, swelling, or associated skin lesions Alignment: Symmetrical Functional ROM: Unrestricted ROM      Stability: No instability detected Muscle Tone/Strength: Functionally intact. No obvious neuro-muscular anomalies detected. Sensory (Neurological): Unimpaired Palpation: No palpable anomalies              Upper Extremity (UE) Exam    Side: Right upper extremity  Side: Left upper extremity  Skin & Extremity Inspection: Skin color, temperature, and hair growth are WNL. No peripheral edema or cyanosis. No masses, redness, swelling, asymmetry, or associated skin lesions. No contractures.  Skin & Extremity Inspection: Skin color, temperature, and hair growth are WNL. No peripheral edema or cyanosis. No masses, redness, swelling, asymmetry, or associated skin lesions. No contractures.  Functional ROM: Unrestricted ROM          Functional ROM: Unrestricted ROM          Muscle Tone/Strength: Functionally intact. No obvious neuro-muscular anomalies detected.   Muscle Tone/Strength: Functionally intact. No obvious  neuro-muscular anomalies detected.  Sensory (Neurological): Unimpaired          Sensory (Neurological): Unimpaired          Palpation: No palpable anomalies              Palpation: No palpable anomalies              Provocative Test(s):  Phalen's test: deferred Tinel's test: deferred Apley's scratch test (touch opposite shoulder):  Action 1 (Across chest): deferred Action 2 (Overhead): deferred Action 3 (LB reach): deferred   Provocative Test(s):  Phalen's test: deferred Tinel's test: deferred Apley's scratch test (touch opposite shoulder):  Action 1 (Across chest): deferred Action 2 (Overhead): deferred Action 3 (LB reach): deferred    Thoracic Spine Area Exam  Skin & Axial Inspection: No masses, redness, or swelling Alignment: Symmetrical Functional ROM: Unrestricted ROM Stability: No instability  detected Muscle Tone/Strength: Functionally intact. No obvious neuro-muscular anomalies detected. Sensory (Neurological): Unimpaired Muscle strength & Tone: No palpable anomalies  Lumbar Exam  Skin & Axial Inspection: No masses, redness, or swelling Alignment: Symmetrical Functional ROM: Pain restricted ROM       Stability: No instability detected Muscle Tone/Strength: Functionally intact. No obvious neuro-muscular anomalies detected. Sensory (Neurological): Unimpaired   Gait & Posture Assessment  Ambulation: Unassisted Gait: Antalgic Posture: WNL   Lower Extremity Exam    Side: Right lower extremity  Side: Left lower extremity  Stability: No instability observed          Stability: No instability observed          Skin & Extremity Inspection: Skin color, temperature, and hair growth are WNL. No peripheral edema or cyanosis. No masses, redness, swelling, asymmetry, or associated skin lesions. No contractures.  Skin & Extremity Inspection: Below knee amputation (BKA) prosthesis in place  Functional ROM: Pain restricted ROM for hip joint          Functional ROM: Pain restricted  ROM for hip joint          Muscle Tone/Strength: Functionally intact. No obvious neuro-muscular anomalies detected.  Muscle Tone/Strength: Functionally intact. No obvious neuro-muscular anomalies detected.  Sensory (Neurological): Arthropathic arthralgia        Sensory (Neurological): Neurogenic pain pattern        DTR: Patellar: deferred today Achilles: deferred today Plantar: deferred today  DTR: Patellar: deferred today Achilles: deferred today Plantar: deferred today  Palpation: No palpable anomalies  Palpation: No palpable anomalies   Assessment  Primary Diagnosis & Pertinent Problem List: The primary encounter diagnosis was Phantom limb pain ([place]). Diagnoses of Status post below-knee amputation of left lower extremity ([place]), Neuropathic pain, and Chronic pain syndrome were also pertinent to this visit.  Visit Diagnosis (New problems to examiner): 1. Phantom limb pain ([place])   2. Status post below-knee amputation of left lower extremity ([place])   3. Neuropathic pain   4. Chronic pain syndrome    Plan of Care (Initial workup plan)  I had an extensive discussion with the patient regarding his treatment options.  Patient has failed multiple medications.  History of opioid dependence related to number of extensive surgeries that he had in 2019 and 2020.  Is currently on Suboxone.  States that it does not help with his left lower extremity phantom pain.  Is on Lyrica 150 mg 3 times a day.  Is having side effects with this medication and would like to decrease.  Has had nerve blocks, epidural injections, spinal injections in the past with limited benefit.  Has failed various neuropathic's and membrane stabilizers including TCAs, gabapentin.  Recommend patient consider alpha lipoic acid which can help with neuropathic pain and he can use that as an adjunct to his Lyrica.  We spent a great deal of time discussing spinal cord stimulation therapy for his phantom limb pain given that he has failed  medication management, physical therapy and conservative spinal injections.  Spinal cord stimulation can be helpful for phantom limb pain.  Will obtain thoracic and lumbar MRI for work-up to rule out thoracic and lumbar canal stenosis.  I will also place referral to Dr. Lyman Speller for psychological evaluation.  I have encouraged the patient to follow-up with his prescribing provider of Eliquis and see if it is okay to stop his Eliquis for 10 days prior and during his spinal cord stimulator trial.  Given the patient's prior DVT, this does carry  inherent risk and I would like the patient to carefully think about this before deciding to pursue an elective procedure such as spinal cord stimulation.  I will follow up on [facility] note to see what her recommendations are regarding his Eliquis.  I have instructed to the patient to call and schedule his second visit after he has completed his MRI studies as below and completed his psychological evaluation and seen his primary care provider to discuss whether he can discontinue Eliquis for 10 days prior to and during the duration of his SCS trial.  Imaging Orders     MR THORACIC SPINE WO CONTRAST     MR [place]  Referral Orders     Ambulatory referral to Psychology  Interventional management options: Mr. Bundren was informed that there is no guarantee that he would be a candidate for interventional therapies. The decision will be based on the results of diagnostic studies, as well as Mr. Strojny's risk profile.  Procedure(s) under consideration:  SCS trial   Provider-requested follow-up: No follow-ups on file.  No future appointments.  Note by: Gillis Santa, MD Date: 02/14/2020; Time: 3:20 PM

## 2020-02-24 ENCOUNTER — Other Ambulatory Visit: Payer: Self-pay | Admitting: Otolaryngology

## 2020-02-28 ENCOUNTER — Telehealth: Payer: Self-pay | Admitting: Student in an Organized Health Care Education/Training Program

## 2020-02-28 NOTE — Telephone Encounter (Signed)
PCP or prescribing physician

## 2020-02-28 NOTE — Telephone Encounter (Signed)
Dr. Cherylann Ratel,                You have only seen this patient for an initial evaluation and you have not prescribed these medications before. Are you willing to refill or should I have him ask the prescribing physician to refill? Please advise.

## 2020-02-28 NOTE — Telephone Encounter (Signed)
Patient lvmail asking for refill of Lyrica and Tizanidine. Please advise patient

## 2020-02-29 ENCOUNTER — Ambulatory Visit: Admission: RE | Admit: 2020-02-29 | Payer: Medicare Other | Source: Ambulatory Visit

## 2020-03-01 ENCOUNTER — Telehealth: Payer: Self-pay | Admitting: Student in an Organized Health Care Education/Training Program

## 2020-03-01 NOTE — Telephone Encounter (Signed)
FYI he did NOT show for his MRIs.

## 2020-03-01 NOTE — Telephone Encounter (Signed)
Patient is again calling about medications. He also was No Show for his MRIs that were set up. Please call patient.

## 2020-03-01 NOTE — Telephone Encounter (Signed)
Spoke with patient at length about refilling his Lyrica and Zanaflex. He relocated pain clinics due to a 1.5 hour commute. He is no longer a patient there and his PCP is not willing to prescribe these medications now that he is a pain clinic patient. Should he schedule a VV with you to discuss or will he have to wait until his next visit (which is pending a few referrals that may take time to complete)? Please advise.

## 2020-03-05 ENCOUNTER — Inpatient Hospital Stay (HOSPITAL_COMMUNITY): Admission: RE | Admit: 2020-03-05 | Payer: Medicare Other | Source: Ambulatory Visit

## 2020-03-05 ENCOUNTER — Inpatient Hospital Stay (HOSPITAL_COMMUNITY)
Admission: RE | Admit: 2020-03-05 | Discharge: 2020-03-05 | Disposition: A | Discharge status: Home / Self Care | Payer: Medicare Other | Source: Ambulatory Visit

## 2020-03-05 NOTE — Progress Notes (Addendum)
[facility] [address] - [place], [place] - [address] [place] [address] Phone: [phone] Fax: [phone]      Your procedure is scheduled on Wednesday November 24th .  Report to [facility] Main Entrance "A" at 10:35 A.M., and check in at the Admitting office.  Call this number if you have problems the morning of surgery:  [phone]  Call [phone] if you have any questions prior to your surgery date Monday-Friday 8am-4pm    Remember:  Do not eat or drink anything after midnight the night before your surgery    Take these medicines the morning of surgery with A SIP OF WATER   metoprolol succinate (TOPROL XL) 25 MG 24 hr tablet  pregabalin (LYRICA) 150 MG capsule  tiZANidine (ZANAFLEX) 4 MG tablet   IF NEEDED  acetaminophen (TYLENOL) 325 MG tablet   Hold your Buprenorphine HCl-Naloxone HCl 8-2 MG FILM three days prior to surgery.  As of today, STOP taking any Aspirin (unless otherwise instructed by your surgeon) Aleve, Naproxen, Ibuprofen, Motrin, Advil, Goody's, BC's, all herbal medications, fish oil, and all vitamins.  If no instructions were given regarding your Eliquis please reach out to your surgeon for instruction.                     Do not wear jewelry            Do not wear lotions, powders, colognes, or deodorant.            Do not shave 48 hours prior to surgery.  Men may shave face and neck.            Do not bring valuables to the hospital.            [facility] is not responsible for any belongings or valuables.  Do NOT Smoke (Tobacco/Vaping) or drink Alcohol 24 hours prior to your procedure If you use a CPAP at night, you may bring all equipment for your overnight stay.   Contacts, glasses, dentures or bridgework may not be worn into surgery.      For patients admitted to the hospital, discharge time will be determined by your treatment team.   Patients discharged the day of surgery will not be  allowed to drive home, and someone needs to stay with them for 24 hours.    Special instructions:   [facility]- Preparing For Surgery  Before surgery, you can play an important role. Because skin is not sterile, your skin needs to be as free of germs as possible. You can reduce the number of germs on your skin by washing with CHG (chlorahexidine gluconate) Soap before surgery.  CHG is an antiseptic cleaner which kills germs and bonds with the skin to continue killing germs even after washing.    Oral Hygiene is also important to reduce your risk of infection.  Remember - BRUSH YOUR TEETH THE MORNING OF SURGERY WITH YOUR REGULAR TOOTHPASTE  Please do not use if you have an allergy to CHG or antibacterial soaps. If your skin becomes reddened/irritated stop using the CHG.  Do not shave (including legs and underarms) for at least 48 hours prior to first CHG shower. It is OK to shave your face.  Please follow these instructions carefully.   1. Shower the NIGHT BEFORE SURGERY and the MORNING OF SURGERY with CHG Soap.   2. If you chose to wash your hair, wash your hair first as usual  with your normal shampoo.  3. After you shampoo, rinse your hair and body thoroughly to remove the shampoo.  4. Use CHG as you would any other liquid soap. You can apply CHG directly to the skin and wash gently with a scrungie or a clean washcloth.   5. Apply the CHG Soap to your body ONLY FROM THE NECK DOWN.  Do not use on open wounds or open sores. Avoid contact with your eyes, ears, mouth and genitals (private parts). Wash Face and genitals (private parts)  with your normal soap.   6. Wash thoroughly, paying special attention to the area where your surgery will be performed.  7. Thoroughly rinse your body with warm water from the neck down.  8. DO NOT shower/wash with your normal soap after using and rinsing off the CHG Soap.  9. Pat yourself dry with a CLEAN TOWEL.  10. Wear CLEAN PAJAMAS to bed the night  before surgery  11. Place CLEAN SHEETS on your bed the night of your first shower and DO NOT SLEEP WITH PETS.   Day of Surgery: Wear Clean/Comfortable clothing the morning of surgery Do not apply any deodorants/lotions.   Remember to brush your teeth WITH YOUR REGULAR TOOTHPASTE.   Please read over the following fact sheets that you were given.

## 2020-03-07 ENCOUNTER — Encounter (HOSPITAL_COMMUNITY): Admission: RE | Payer: Self-pay | Source: Home / Self Care

## 2020-03-07 ENCOUNTER — Ambulatory Visit (HOSPITAL_COMMUNITY): Admission: RE | Admit: 2020-03-07 | Payer: Medicare Other | Source: Home / Self Care | Admitting: Otolaryngology

## 2020-03-07 SURGERY — MICROLARYNGOSCOPY, WITH VOCAL CORD INJECTION
Anesthesia: General

## 2020-05-08 ENCOUNTER — Encounter: Payer: Self-pay | Admitting: Student in an Organized Health Care Education/Training Program

## 2020-05-08 ENCOUNTER — Ambulatory Visit
Payer: Medicare Other | Attending: Student in an Organized Health Care Education/Training Program | Admitting: Student in an Organized Health Care Education/Training Program

## 2020-05-08 ENCOUNTER — Other Ambulatory Visit: Payer: Self-pay

## 2020-05-08 VITALS — BP 145/112 | HR 83 | Temp 97.9°F | Resp 18 | Ht 65.0 in | Wt 175.0 lb

## 2020-05-08 DIAGNOSIS — Z89512 Acquired absence of left leg below knee: Secondary | ICD-10-CM | POA: Insufficient documentation

## 2020-05-08 DIAGNOSIS — G546 Phantom limb syndrome with pain: Secondary | ICD-10-CM | POA: Diagnosis not present

## 2020-05-08 DIAGNOSIS — M792 Neuralgia and neuritis, unspecified: Secondary | ICD-10-CM | POA: Diagnosis not present

## 2020-05-08 MED ORDER — PREGABALIN 150 MG PO CAPS: 150.0000 mg | ORAL_CAPSULE | Freq: Three times a day (TID) | ORAL | 2 refills | Status: DC

## 2020-05-08 MED ORDER — TIZANIDINE HCL 4 MG PO TABS
4.0000 mg | ORAL_TABLET | Freq: Three times a day (TID) | ORAL | 2 refills | Status: DC | PRN
Start: 2020-05-08 — End: 2020-08-01

## 2020-05-08 NOTE — Progress Notes (Signed)
Safety precautions to be maintained throughout the outpatient stay will include: orient to surroundings, keep bed in low position, maintain call bell within reach at all times, provide assistance with transfer out of bed and ambulation.  

## 2020-05-08 NOTE — Progress Notes (Signed)
PROVIDER NOTE: Information contained herein reflects review and annotations entered in association with encounter. Interpretation of such information and data should be left to medically-trained personnel. Information provided to patient can be located elsewhere in the medical record under "Patient Instructions". Document created using STT-dictation technology, any transcriptional errors that may result from process are unintentional.    Patient: David Davies  Service Category: E/M  Provider: Gillis Santa, MD  DOB: [birth year]  DOS: 05/08/2020  Specialty: Interventional Pain Management  MRN: [id number]  Setting: Ambulatory outpatient  PCP: [facility] [place]  Type: Established Patient    Referring Provider: [facility]  Location: Office  Delivery: Face-to-face     HPI  Mr. Lakeith Careaga, a 40 y.o. year old male, is here today because of his Phantom limb pain ([place]) [G54.6]. Mr. Terlizzi primary complain today is Leg Pain (bilat) Last encounter: My last encounter with him was on 03/01/2020. Pertinent problems: Mr. Lycan does not have any pertinent problems on file. Pain Assessment: Severity of Chronic pain is reported as a 8 /10. Location: Leg (calves bilat; Right leg phantom pain; BKA) Right,Left/denies. Onset: More than a month ago. Quality: Aching,Sharp,Shooting. Timing: Constant. Modifying factor(s): meds. Vitals:  height is 5' 5"  (1.651 m) and weight is 175 lb (79.4 kg). His temporal temperature is 97.9 F (36.6 C). His blood pressure is 145/112 (abnormal) and his pulse is 83. His respiration is 18 and oxygen saturation is 99%.   Reason for encounter: follow-up evaluation, second patient visit  Patient presents today for his second patient visit.  Please see below for his HPI from his initial clinic visit on 02/14/2020.  I was very clear with the patient on that visit that I would not be prescribing him any opioid analgesics to help manage his pain but that we could focus  on behavioral, psychological, interventional pain therapies to help manage his pain.  He presents today with increased phantom limb pain and muscle pain.  Has initial clinic visit, we discussed spinal cord stimulation and we were going to proceed with work-up via thoracic lumbar MRI however the patient was a no-show to that appointment on 02/29/2020.  Furthermore he was unavailable and missed his appointment with Dr. Lyman Speller with neuropsychology for [facility] psych evaluation.  At this point I have limited therapies to offer this patient.  He is also on Eliquis and high risk to be off.  I will refill his Lyrica 150 mg 3 times a day as well as tizanidine.  I instructed him to have his primary care provider manage his medications which are nonnarcotic.  Patient was again counseled on the risk of chronic opioid therapy and how he would not be a candidate for COT. at this clinic for reasons detailed in his initial clinic note, copied below  HPI from 11-20 21, initial clinic note  Barrie is a 40 year old male who presents with a chief complaint of left phantom pain, left neuropathic pain of left thigh.  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 Of note patient is anticoagulated on Eliquis 5 mg twice daily.  Given his extensive surgeries over a short period of time, patient states that he was prescribed oxycodone for pain management and that after his surgeries, he was still dependent on them.  For this reason, he was transition to  Suboxone and has been on this medication for quite some time.  He states that the Suboxone helps minimize withdrawal symptoms, allows him to sleep and allows him to function but does not necessarily help with his left lower extremity phantom pain.  Of note patient  is on high-dose Lyrica 150 mg 3 times a day and is endorsing some side effects with this medication such as decreased sensory perception.  He also does not like the way it makes him feel.  He was on a much lower dose before.  He would like to decrease his Lyrica usage.  He has tried gabapentin, amitriptyline, nortriptyline in the past.  He has also tried various muscle relaxers including Robaxin, tizanidine, Flexeril with limited benefit.  Do not recommend direct acting opioid analgesic therapy.  He has done physical therapy in the past.  He is also done various spinal injections, nerve blocks with limited benefit.  He does have a prosthesis for his lower extremity in place.  Of note, upon reviewing the patient's prior pain clinic notes, there was an episode in 2018 where the patient presented to the emergency department on his own for chest palpitations.  He states that he had taken a medication given to him by his friend for pain which actually had cocaine in it which was causing his palpitations.  He adamantly states that he does not abuse drugs.   I informed the patient that we will focus on interventional pain management.  He is instructed to continue Suboxone as prescribed by his prescribing provider.  ROS  Constitutional: Denies any fever or chills Gastrointestinal: No reported hemesis, hematochezia, vomiting, or acute GI distress Musculoskeletal: Denies any acute onset joint swelling, redness, loss of ROM, or weakness Neurological: Phantom limb pain  Medication Review  Buprenorphine HCl-Naloxone HCl, acetaminophen, apixaban, carvedilol, gabapentin, metoprolol succinate, pregabalin, and tiZANidine  History Review  Allergy: Mr. Mazzocco has No Known Allergies. Drug: Mr. Obryant  reports no history of drug use. Alcohol:  reports no history of alcohol use. Tobacco:  reports that he has never smoked. He has never used smokeless tobacco. Social: Mr. Hovater  reports that he has never smoked.  He has never used smokeless tobacco. He reports that he does not drink alcohol and does not use drugs. Medical:  has a past medical history of Chronic back pain, Chronic leg pain, Chronic pain, DVT (deep venous thrombosis) ([place]), Hypertension, and Left leg paresthesias. Surgical: Mr. Zywicki  has a past surgical history that includes Thrombectomy; Fasciotomy; and left bka. Family: family history is not on file.  Laboratory Chemistry Profile   Renal Lab Results  Component Value Date   BUN 6 12/03/2019   CREATININE 1.09 12/03/2019   GFR 133.59 08/22/2014   GFRAA >60 12/03/2019   GFRNONAA >60 12/03/2019     Hepatic Lab Results  Component Value Date   AST 52 (H) 06/05/2019   ALT 33 06/05/2019   ALBUMIN 3.5 06/05/2019   ALKPHOS 67 06/05/2019   LIPASE 11 (L) 12/15/2014     Electrolytes Lab Results  Component Value Date   NA 137 12/03/2019   K 3.7 12/03/2019   CL 102 12/03/2019   CALCIUM 9.2 12/03/2019     Bone No results found for: VD25OH, [id number], OJ5009FG1, [id number], 25OHVITD1, 25OHVITD2, 25OHVITD3, TESTOFREE, TESTOSTERONE   Inflammation (CRP: Acute Phase) (ESR: Chronic Phase) Lab Results  Component Value Date   LATICACIDVEN 1.4 12/15/2014       Note: Above Lab results reviewed.  Recent Imaging Review  DG Chest 2 View CLINICAL DATA:  Chest pain, hypertension  EXAM: CHEST - 2 VIEW  COMPARISON:  06/05/2019  FINDINGS: The heart size and mediastinal contours are within normal limits. Both lungs are clear. The visualized skeletal structures are unremarkable.  IMPRESSION: No active cardiopulmonary disease.  Electronically Signed   By: Fidela Salisbury MD   On: 12/03/2019 01:52 Note: Reviewed        Physical Exam  General appearance: Well nourished, well developed, and well hydrated. In no apparent acute distress Mental status: Alert, oriented x 3 (person, place, & time)       Respiratory: No evidence of acute respiratory distress Eyes: PERLA Vitals: BP  (!) 145/112   Pulse 83   Temp 97.9 F (36.6 C) (Temporal)   Resp 18   Ht 5' 5"  (1.651 m)   Wt 175 lb (79.4 kg)   SpO2 99%   BMI 29.12 kg/m  BMI: Estimated body mass index is 29.12 kg/m as calculated from the following:   Height as of this encounter: 5' 5"  (1.651 m).   Weight as of this encounter: 175 lb (79.4 kg). Ideal: Ideal body weight: 61.5 kg (135 lb 9.3 oz) Adjusted ideal body weight: 68.7 kg (151 lb 5.6 oz)  Lumbar Exam  Skin & Axial Inspection: No masses, redness, or swelling Alignment: Symmetrical Functional ROM: Pain restricted ROM       Stability: No instability detected Muscle Tone/Strength: Functionally intact. No obvious neuro-muscular anomalies detected. Sensory (Neurological): Unimpaired   Gait & Posture Assessment  Ambulation: Unassisted Gait: Antalgic Posture: WNL   Lower Extremity Exam    Side: Right lower extremity  Side: Left lower extremity  Stability: No instability observed          Stability: No instability observed          Skin & Extremity Inspection: Skin color, temperature, and hair growth are WNL. No peripheral edema or cyanosis. No masses, redness, swelling, asymmetry, or associated skin lesions. No contractures.  Skin & Extremity Inspection: Below knee amputation (BKA) prosthesis in place  Functional ROM: Pain restricted ROM for hip joint          Functional ROM: Pain restricted ROM for hip joint          Muscle Tone/Strength: Functionally intact. No obvious neuro-muscular anomalies detected.  Muscle Tone/Strength: Functionally intact. No obvious neuro-muscular anomalies detected.  Sensory (Neurological): Arthropathic arthralgia        Sensory (Neurological): Neurogenic pain pattern        DTR: Patellar: deferred today Achilles: deferred today Plantar: deferred today  DTR: Patellar: deferred today Achilles: deferred today Plantar: deferred today  Palpation: No palpable anomalies  Palpation: No palpable anomalies      Assessment   Status Diagnosis  Persistent Persistent Persistent 1. Phantom limb pain ([place])   2. Status post below-knee amputation of left lower extremity ([place])   3. Neuropathic pain        Plan of Care   Mr. [name] has a current medication list which includes the following long-term medication(s): apixaban, carvedilol, metoprolol succinate, pregabalin, and [DISCONTINUED] gabapentin.  Pharmacotherapy (Medications Ordered): Meds ordered this encounter  Medications  . pregabalin (LYRICA) 150 MG capsule    Sig: Take 1 capsule (150 mg total) by mouth in the morning, at noon, and at bedtime.    Dispense:  90 capsule    Refill:  2  . tiZANidine (ZANAFLEX) 4 MG tablet    Sig: Take 1 tablet (4 mg total) by  mouth every 8 (eight) hours as needed for muscle spasms.    Dispense:  90 tablet    Refill:  2   Limited options for [place] at this time.  He was unable to complete his lumbar, thoracic MRI or his psych evaluation.  He has been told that he cannot stop his Eliquis for 10 days which precludes him from a safe spinal cord stimulator trial.  He could consider a lidocaine infusion.  He has failed various membrane stabilizers including amitriptyline, gabapentin.  Continues on Suboxone for what may be a history of opioid use disorder.  Continues Lyrica 150 mg 3 times daily which I will refill today.  Also refill his tizanidine and he can increase it from twice daily as needed to every 8 hours as needed for increased muscle spasms.  Discussed the impact of chronic pain on mood instructed him to follow-up with Dr. Lyman Speller for pain coping skills for his chronic pain.  Follow-up plan:   No follow-ups on file.   Recent Visits Date Type Provider Dept  02/14/20 Office Visit Gillis Santa, MD [facility]  Showing recent visits within past 90 days and meeting all other requirements Today's Visits Date Type Provider Dept  05/08/20 Office Visit Gillis Santa, MD [facility]  Showing today's visits and meeting all other requirements Future Appointments Date Type Provider Dept  08/02/20 Appointment Gillis Santa, MD [facility]  Showing future appointments within next 90 days and meeting all other requirements  I discussed the assessment and treatment plan with the patient. The patient was provided an opportunity to ask questions and all were answered. The patient agreed with the plan and demonstrated an understanding of the instructions.  Patient advised to call back or seek an in-person evaluation if the symptoms or condition worsens.  Duration of encounter: 30 minutes.  Note by: Gillis Santa, MD Date: 05/08/2020; Time: 8:37 AM

## 2020-05-15 ENCOUNTER — Ambulatory Visit: Payer: Medicare Other | Admitting: Student in an Organized Health Care Education/Training Program

## 2020-07-05 ENCOUNTER — Encounter: Payer: Self-pay | Admitting: Emergency Medicine

## 2020-07-05 ENCOUNTER — Other Ambulatory Visit: Payer: Self-pay

## 2020-07-05 ENCOUNTER — Emergency Department
Admission: EM | Admit: 2020-07-05 | Discharge: 2020-07-05 | Disposition: A | Discharge status: Home / Self Care | Payer: Medicare Other | Attending: Emergency Medicine | Admitting: Emergency Medicine

## 2020-07-05 DIAGNOSIS — Z7901 Long term (current) use of anticoagulants: Secondary | ICD-10-CM | POA: Diagnosis not present

## 2020-07-05 DIAGNOSIS — Z79899 Other long term (current) drug therapy: Secondary | ICD-10-CM | POA: Diagnosis not present

## 2020-07-05 DIAGNOSIS — R112 Nausea with vomiting, unspecified: Secondary | ICD-10-CM | POA: Diagnosis not present

## 2020-07-05 DIAGNOSIS — Z20822 Contact with and (suspected) exposure to covid-19: Secondary | ICD-10-CM | POA: Diagnosis not present

## 2020-07-05 DIAGNOSIS — I10 Essential (primary) hypertension: Secondary | ICD-10-CM | POA: Diagnosis not present

## 2020-07-05 DIAGNOSIS — Z86718 Personal history of other venous thrombosis and embolism: Secondary | ICD-10-CM | POA: Insufficient documentation

## 2020-07-05 LAB — CBC
HCT: 44.6 % (ref 39.0–52.0)
Hemoglobin: 15.5 g/dL (ref 13.0–17.0)
MCH: 29.6 pg (ref 26.0–34.0)
MCHC: 34.8 g/dL (ref 30.0–36.0)
MCV: 85.1 fL (ref 80.0–100.0)
Platelets: 288 10*3/uL (ref 150–400)
RBC: 5.24 MIL/uL (ref 4.22–5.81)
RDW: 13.2 % (ref 11.5–15.5)
WBC: 4.3 10*3/uL (ref 4.0–10.5)
nRBC: 0 % (ref 0.0–0.2)

## 2020-07-05 LAB — RESP PANEL BY RT-PCR (FLU A&B, COVID) ARPGX2
Influenza A by PCR: NEGATIVE
Influenza B by PCR: NEGATIVE
SARS Coronavirus 2 by RT PCR: NEGATIVE

## 2020-07-05 LAB — COMPREHENSIVE METABOLIC PANEL
ALT: 95 U/L — ABNORMAL HIGH (ref 0–44)
AST: 59 U/L — ABNORMAL HIGH (ref 15–41)
Albumin: 4 g/dL (ref 3.5–5.0)
Alkaline Phosphatase: 71 U/L (ref 38–126)
Anion gap: 12 (ref 5–15)
BUN: 11 mg/dL (ref 6–20)
CO2: 24 mmol/L (ref 22–32)
Calcium: 9.4 mg/dL (ref 8.9–10.3)
Chloride: 104 mmol/L (ref 98–111)
Creatinine, Ser: 0.79 mg/dL (ref 0.61–1.24)
GFR, Estimated: >60 mL/min (ref >60)
Glucose, Bld: 138 mg/dL — ABNORMAL HIGH (ref 70–99)
Potassium: 3.9 mmol/L (ref 3.5–5.1)
Sodium: 140 mmol/L (ref 135–145)
Total Bilirubin: 1.1 mg/dL (ref 0.3–1.2)
Total Protein: 8.6 g/dL — ABNORMAL HIGH (ref 6.5–8.1)

## 2020-07-05 LAB — LIPASE, BLOOD: Lipase: 54 U/L — ABNORMAL HIGH (ref 11–51)

## 2020-07-05 MED ORDER — MORPHINE SULFATE (PF) 2 MG/ML IV SOLN
2.0000 mg | Freq: Once | INTRAVENOUS | Status: AC
  Administered 2020-07-05: 2 mg via INTRAVENOUS
  Filled 2020-07-05: qty 1

## 2020-07-05 MED ORDER — METOCLOPRAMIDE HCL 5 MG/ML IJ SOLN
10.0000 mg | Freq: Once | INTRAMUSCULAR | Status: AC
  Administered 2020-07-05: 10 mg via INTRAVENOUS
  Filled 2020-07-05: qty 2

## 2020-07-05 MED ORDER — BUPRENORPHINE HCL-NALOXONE HCL 8-2 MG SL SUBL
1.0000 | SUBLINGUAL_TABLET | Freq: Once | SUBLINGUAL | Status: AC
  Administered 2020-07-05: 1 via SUBLINGUAL
  Filled 2020-07-05: qty 1

## 2020-07-05 MED ORDER — KETOROLAC TROMETHAMINE 30 MG/ML IJ SOLN
30.0000 mg | Freq: Once | INTRAMUSCULAR | Status: AC
  Administered 2020-07-05: 30 mg via INTRAVENOUS
  Filled 2020-07-05: qty 1

## 2020-07-05 MED ORDER — SODIUM CHLORIDE 0.9 % IV BOLUS
1000.0000 mL | Freq: Once | INTRAVENOUS | Status: AC
  Administered 2020-07-05: 1000 mL via INTRAVENOUS

## 2020-07-05 NOTE — ED Notes (Signed)
Pt states has been on opiod pain medications since surgery to remove DVTs 11/21 and on and off since 12/19 for L BKA.  Pt has been tapering off but believes this could be contributing to nausea/vomiting/tremors/malaise.

## 2020-07-05 NOTE — ED Triage Notes (Signed)
Pt ems home for n/v x 7 days. Pt states that he is getting weak from not being able to eat or drink. Pt states that he last urinated last night.

## 2020-07-05 NOTE — ED Notes (Signed)
This nurse went to MRI to administer meds to another pt. Upon return, pt not seen in hall bed. IV catheter, still attached to tubing and fluids, laying on bed.

## 2020-07-05 NOTE — ED Notes (Addendum)
Pt having severe N/V since 1 week, states each time tries to drink fluids, even water, he vomits.  Denies pain, except states abdominal muscles sore from vomiting. EDP at bedside.

## 2020-07-05 NOTE — ED Provider Notes (Signed)
[facility] Emergency Department Provider Note  Time seen: 12:09 PM  I have reviewed the triage vital signs and the nursing notes.   HISTORY  Chief Complaint Nausea   HPI David Davies is a 40 y.o. male with a past medical history of chronic pain, hypertension, presents to the emergency department for nausea vomiting.  According to the patient for the past 1 week he has been experiencing frequent episodes of nausea vomiting having trouble keeping fluids down.  Patient denies any abdominal pain, no diarrhea or fever.  Patient states he has been experiencing chills at times but no measured fever.  No cough or congestion.   Past Medical History:  Diagnosis Date  . Chronic back pain   . Chronic leg pain    left  . Chronic pain   . DVT (deep venous thrombosis) ([facility])   . Hypertension   . Left leg paresthesias     Patient Active Problem List   Diagnosis Date Noted  . Phantom limb pain ([facility]) 02/14/2020  . Status post below-knee amputation of left lower extremity ([facility]) 02/14/2020  . Neuropathic pain 02/14/2020  . Chronic pain syndrome 02/14/2020    Past Surgical History:  Procedure Laterality Date  . FASCIOTOMY    . left bka    . THROMBECTOMY      Prior to Admission medications   Medication Sig Start Date End Date Taking? Authorizing Provider  acetaminophen (TYLENOL) 325 MG tablet Take by mouth. Patient not taking: Reported on 05/08/2020 03/05/19   [provider]  apixaban (ELIQUIS) 5 MG TABS tablet Take 1 tablet (5 mg total) by mouth 2 (two) times daily. 05/07/19   Emily Filbert, MD  Buprenorphine HCl-Naloxone HCl 8-2 MG FILM Place under the tongue. Patient not taking: Reported on 05/08/2020 02/09/20   [provider]  carvedilol (COREG) 25 MG tablet Take 25 mg by mouth 2 (two) times daily with a meal.    [provider]  metoprolol succinate (TOPROL XL) 25 MG 24 hr tablet Take 1 tablet (25 mg total) by mouth daily.  06/05/19 08/04/19  Miguel Aschoff., MD  pregabalin (LYRICA) 150 MG capsule Take 1 capsule (150 mg total) by mouth in the morning, at noon, and at bedtime. 05/08/20   Edward Jolly, MD  tiZANidine (ZANAFLEX) 4 MG tablet Take 1 tablet (4 mg total) by mouth every 8 (eight) hours as needed for muscle spasms. 05/08/20   Edward Jolly, MD  gabapentin (NEURONTIN) 300 MG capsule Take 1 capsule (300 mg total) by mouth 3 (three) times daily. Patient not taking: Reported on 12/15/2014 08/22/14 12/15/14  Pecola Lawless, MD    No Known Allergies  History reviewed. No pertinent family history.  Social History Social History   Tobacco Use  . Smoking status: Never Smoker  . Smokeless tobacco: Never Used  Vaping Use  . Vaping Use: Never used  Substance Use Topics  . Alcohol use: No  . Drug use: No    Review of Systems Constitutional: Negative for fever. Cardiovascular: Negative for chest pain. Respiratory: Negative for shortness of breath. Gastrointestinal: Negative for abdominal pain.  Positive for nausea vomiting. Musculoskeletal: Negative for musculoskeletal complaints Neurological: Negative for headache All other ROS negative  ____________________________________________   PHYSICAL EXAM:  VITAL SIGNS: ED Triage Vitals  Enc Vitals Group     BP 07/05/20 1028 (!) 160/128     Pulse Rate 07/05/20 1028 92     Resp 07/05/20 1028 18  Temp 07/05/20 1028 98.3 F (36.8 C)     Temp Source 07/05/20 1028 Oral     SpO2 07/05/20 1028 97 %     Weight 07/05/20 1027 175 lb (79.4 kg)     Height 07/05/20 1027 5\' 5"  (1.651 m)     Head Circumference --      Peak Flow --      Pain Score 07/05/20 1052 8     Pain Loc --      Pain Edu? --      Excl. in GC? --     Constitutional: Alert and oriented. Well appearing and in no distress. Eyes: Normal exam ENT      Head: Normocephalic and atraumatic.      Mouth/Throat: Mucous membranes are moist. Cardiovascular: Normal rate, regular rhythm.   Respiratory: Normal respiratory effort without tachypnea nor retractions. Breath sounds are clear  Gastrointestinal: Soft and nontender. No distention.   Musculoskeletal: Nontender with normal range of motion in all extremities. Neurologic:  Normal speech and language. No gross focal neurologic deficits Skin:  Skin is warm, dry and intact.  Psychiatric: Mood and affect are normal.  ____________________________________________   INITIAL IMPRESSION / ASSESSMENT AND PLAN / ED COURSE  Pertinent labs & imaging results that were available during my care of the patient were reviewed by me and considered in my medical decision making (see chart for details).   Patient presents emergency department for nausea vomiting over the past 1 week.  Patient states over the past week he has been experiencing nausea frequent episodes of vomiting especially after eating or drinking.  States he feels dehydrated.  Has had chills but no measured temperature.  Denies any cough or congestion.  Patient's labs are largely within normal limits besides mild LFT elevation.  Patient does admit to daily alcohol intake.  Specific attention paid to the right upper quadrant on abdominal exam with no tenderness to palpation.  In reviewing the patient's chart it appears that he takes Suboxone.  States he has not been taking it over the past 3 days or so due to the nausea and vomiting, he believes it was making him worse.  Patient could be experiencing a degree of withdrawal as well.  We will dose Suboxone in the emergency department.  It appears that the patient has eloped from the emergency department and has removed his IV.  Unclear why the patient has done this, he appeared to have been improving throughout his stay.  David Davies was evaluated in Emergency Department on 07/05/2020 for the symptoms described in the history of present illness. He was evaluated in the context of the global COVID-19 pandemic, which necessitated  consideration that the patient might be at risk for infection with the SARS-CoV-2 virus that causes COVID-19. Institutional protocols and algorithms that pertain to the evaluation of patients at risk for COVID-19 are in a state of rapid change based on information released by regulatory bodies including the CDC and federal and state organizations. These policies and algorithms were followed during the patient's care in the ED.  ____________________________________________   FINAL CLINICAL IMPRESSION(S) / ED DIAGNOSES  Nausea vomiting   07/07/2020, MD 07/05/20 1337

## 2020-07-05 NOTE — ED Notes (Signed)
Sent purple, light green and red tubes to lab.

## 2020-08-01 ENCOUNTER — Encounter: Payer: Self-pay | Admitting: Student in an Organized Health Care Education/Training Program

## 2020-08-01 ENCOUNTER — Telehealth: Payer: Self-pay

## 2020-08-01 ENCOUNTER — Other Ambulatory Visit: Payer: Self-pay

## 2020-08-01 ENCOUNTER — Ambulatory Visit
Payer: Medicare Other | Attending: Student in an Organized Health Care Education/Training Program | Admitting: Student in an Organized Health Care Education/Training Program

## 2020-08-01 DIAGNOSIS — G894 Chronic pain syndrome: Secondary | ICD-10-CM

## 2020-08-01 DIAGNOSIS — Z89512 Acquired absence of left leg below knee: Secondary | ICD-10-CM | POA: Diagnosis not present

## 2020-08-01 DIAGNOSIS — G546 Phantom limb syndrome with pain: Secondary | ICD-10-CM | POA: Diagnosis not present

## 2020-08-01 DIAGNOSIS — M792 Neuralgia and neuritis, unspecified: Secondary | ICD-10-CM

## 2020-08-01 MED ORDER — TIZANIDINE HCL 4 MG PO TABS: 4.0000 mg | ORAL_TABLET | Freq: Three times a day (TID) | ORAL | 2 refills | Status: DC | PRN

## 2020-08-01 MED ORDER — PREGABALIN 150 MG PO CAPS: 150.0000 mg | ORAL_CAPSULE | Freq: Three times a day (TID) | ORAL | 2 refills | Status: DC

## 2020-08-01 NOTE — Telephone Encounter (Signed)
Pt was called to update his record for a virtual call appointment today. Unable to leave message on his phone due to full message service.

## 2020-08-01 NOTE — Progress Notes (Signed)
Patient: David Davies  Service Category: E/M  Provider: Gillis Santa, MD  DOB: 1980/12/26  DOS: 08/01/2020  Location: Office  MRN: [id number]  Setting: Ambulatory outpatient  Referring Provider: [facility] [place]*  Type: Established Patient  Specialty: Interventional Pain Management  PCP: [facility] [place]  Location: Home  Delivery: TeleHealth     Virtual Encounter - Pain Management PROVIDER NOTE: Information contained herein reflects review and annotations entered in association with encounter. Interpretation of such information and data should be left to medically-trained personnel. Information provided to patient can be located elsewhere in the medical record under "Patient Instructions". Document created using STT-dictation technology, any transcriptional errors that may result from process are unintentional.    Contact & Pharmacy Preferred: [phone] Home: [phone] (home) Mobile: [phone] (mobile) E-mail: [email or web address]  [facility] [id number], [place] [address] [place] [address] Phone: [phone] Fax: [phone]   Pre-screening  David Davies offered "in-person" vs "virtual" encounter. He indicated preferring virtual for this encounter.   Reason COVID-19*  Social distancing based on CDC and AMA recommendations.   I contacted David Davies on 08/01/2020 via video conference.      I clearly identified myself as Gillis Santa, MD. I verified that I was speaking with the correct person using two identifiers (Name: David Davies, and date of birth: [birth year]).  Consent I sought verbal advanced consent from David Davies for virtual visit interactions. I informed David Davies of possible security and privacy concerns, risks, and limitations associated with providing "not-in-person" medical evaluation and management services. I also informed David Davies of the  availability of "in-person" appointments. Finally, I informed him that there would be a charge for the virtual visit and that he could be  personally, fully or partially, financially responsible for it. David Davies expressed understanding and agreed to proceed.   Historic Elements   David Davies is a 40 y.o. year old, male patient evaluated today after our last contact on 05/08/2020. David Davies  has a past medical history of Chronic back pain, Chronic leg pain, Chronic pain, DVT (deep venous thrombosis) ([place]), Hypertension, and Left leg paresthesias. He also  has a past surgical history that includes Thrombectomy; Fasciotomy; and left bka. David Davies has a current medication list which includes the following prescription(s): acetaminophen, apixaban, buprenorphine hcl-naloxone hcl, carvedilol, metoprolol succinate, pregabalin, tizanidine, and [DISCONTINUED] gabapentin. He  reports that he has never smoked. He has never used smokeless tobacco. He reports that he does not drink alcohol and does not use drugs. David Davies has No Known Allergies.   HPI  Today, he is being contacted for medication management.  No change in medical history since last visit.  Patient's pain is at baseline.  Patient continues multimodal pain regimen as prescribed (Lyrica and Zanaflex).  States that it provides pain relief and improvement in functional status. On Suboxone by Psychiatry.  Laboratory Chemistry Profile   Renal Lab Results  Component Value Date   BUN 11 07/05/2020   CREATININE 0.79 07/05/2020   GFR 133.59 08/22/2014   GFRAA >60 12/03/2019   GFRNONAA >60 07/05/2020     Hepatic Lab Results  Component Value Date   AST 59 (H) 07/05/2020   ALT 95 (H) 07/05/2020   ALBUMIN 4.0 07/05/2020   ALKPHOS 71 07/05/2020   LIPASE 54 (H) 07/05/2020     Electrolytes Lab Results  Component Value Date   NA 140 07/05/2020  K 3.9 07/05/2020   CL 104 07/05/2020   CALCIUM 9.4 07/05/2020     Bone No results  found for: VD25OH, IA165VV7SMO, LM7867JQ4, BE0100FH2, 25OHVITD1, 25OHVITD2, 25OHVITD3, TESTOFREE, TESTOSTERONE   Inflammation (CRP: Acute Phase) (ESR: Chronic Phase) Lab Results  Component Value Date   LATICACIDVEN 1.4 12/15/2014       Note: Above Lab results reviewed.  Imaging  DG Chest 2 View CLINICAL DATA:  Chest pain, hypertension  EXAM: CHEST - 2 VIEW  COMPARISON:  06/05/2019  FINDINGS: The heart size and mediastinal contours are within normal limits. Both lungs are clear. The visualized skeletal structures are unremarkable.  IMPRESSION: No active cardiopulmonary disease.  Electronically Signed   By: Fidela Salisbury MD   On: 12/03/2019 01:52  Assessment  The primary encounter diagnosis was Phantom limb pain ([place]). Diagnoses of Status post below-knee amputation of left lower extremity ([place]), Neuropathic pain, and Chronic pain syndrome were also pertinent to this visit.  Plan of Care   David Davies has a current medication list which includes the following long-term medication(s): apixaban, carvedilol, metoprolol succinate, pregabalin, and [DISCONTINUED] gabapentin.  Pharmacotherapy (Medications Ordered): Meds ordered this encounter  Medications  . pregabalin (LYRICA) 150 MG capsule    Sig: Take 1 capsule (150 mg total) by mouth in the morning, at noon, and at bedtime.    Dispense:  90 capsule    Refill:  2  . tiZANidine (ZANAFLEX) 4 MG tablet    Sig: Take 1 tablet (4 mg total) by mouth every 8 (eight) hours as needed for muscle spasms.    Dispense:  90 tablet    Refill:  2   Follow-up plan:   Return in about 6 months (around 01/31/2021) for Medication Management, virtual.   Recent Visits Date Type Provider Dept  05/08/20 Office Visit Gillis Santa, MD [facility]  Showing recent visits within past 90 days and meeting all other requirements Today's Visits Date Type Provider Dept  08/01/20 Office Visit Gillis Santa, MD [facility]   Showing today's visits and meeting all other requirements Future Appointments No visits were found meeting these conditions. Showing future appointments within next 90 days and meeting all other requirements  I discussed the assessment and treatment plan with the patient. The patient was provided an opportunity to ask questions and all were answered. The patient agreed with the plan and demonstrated an understanding of the instructions.  Patient advised to call back or seek an in-person evaluation if the symptoms or condition worsens.  Duration of encounter: 20 minutes.  Note by: Gillis Santa, MD Date: 08/01/2020; Time: 1:06 PM

## 2020-08-02 ENCOUNTER — Encounter: Payer: Medicare Other | Admitting: Student in an Organized Health Care Education/Training Program

## 2020-11-08 ENCOUNTER — Other Ambulatory Visit: Payer: Self-pay | Admitting: Student in an Organized Health Care Education/Training Program

## 2020-11-08 DIAGNOSIS — G894 Chronic pain syndrome: Secondary | ICD-10-CM

## 2020-11-08 DIAGNOSIS — G546 Phantom limb syndrome with pain: Secondary | ICD-10-CM

## 2020-11-08 DIAGNOSIS — M792 Neuralgia and neuritis, unspecified: Secondary | ICD-10-CM

## 2020-11-08 DIAGNOSIS — Z89512 Acquired absence of left leg below knee: Secondary | ICD-10-CM

## 2020-11-13 ENCOUNTER — Telehealth: Payer: Self-pay

## 2020-11-13 NOTE — Telephone Encounter (Signed)
Pt. Is requesting a refill on lyrica

## 2020-11-14 ENCOUNTER — Telehealth: Payer: Self-pay

## 2020-11-14 DIAGNOSIS — G894 Chronic pain syndrome: Secondary | ICD-10-CM

## 2020-11-14 DIAGNOSIS — Z89512 Acquired absence of left leg below knee: Secondary | ICD-10-CM

## 2020-11-14 DIAGNOSIS — G546 Phantom limb syndrome with pain: Secondary | ICD-10-CM

## 2020-11-14 DIAGNOSIS — M792 Neuralgia and neuritis, unspecified: Secondary | ICD-10-CM

## 2020-11-14 MED ORDER — PREGABALIN 150 MG PO CAPS: 150.0000 mg | ORAL_CAPSULE | Freq: Three times a day (TID) | ORAL | 5 refills | Status: DC

## 2020-11-14 NOTE — Telephone Encounter (Signed)
Pt is requesting refill on Lyrica

## 2020-11-14 NOTE — Telephone Encounter (Signed)
Last appt 07/2020 Next appt 01/2021 Instructions were to f/u in 6 months

## 2020-11-15 NOTE — Telephone Encounter (Signed)
Patient notified

## 2021-01-23 ENCOUNTER — Telehealth: Payer: Self-pay

## 2021-01-23 NOTE — Telephone Encounter (Signed)
Attempted to call patient for 01/24/21 visit. No answer. Unable to leave message r/t full mailbox.

## 2021-01-24 ENCOUNTER — Other Ambulatory Visit: Payer: Self-pay

## 2021-01-24 ENCOUNTER — Ambulatory Visit
Payer: Medicare Other | Attending: Student in an Organized Health Care Education/Training Program | Admitting: Student in an Organized Health Care Education/Training Program

## 2021-01-24 NOTE — Progress Notes (Signed)
I attempted to call the patient however no response. VM full. -Dr Ajeenah Heiny  

## 2021-05-20 ENCOUNTER — Telehealth: Payer: Self-pay | Admitting: Student in an Organized Health Care Education/Training Program

## 2021-05-20 ENCOUNTER — Other Ambulatory Visit: Payer: Self-pay | Admitting: Student in an Organized Health Care Education/Training Program

## 2021-05-20 DIAGNOSIS — G546 Phantom limb syndrome with pain: Secondary | ICD-10-CM

## 2021-05-20 DIAGNOSIS — M792 Neuralgia and neuritis, unspecified: Secondary | ICD-10-CM

## 2021-05-20 DIAGNOSIS — Z89512 Acquired absence of left leg below knee: Secondary | ICD-10-CM

## 2021-05-20 DIAGNOSIS — G894 Chronic pain syndrome: Secondary | ICD-10-CM

## 2021-05-20 NOTE — Telephone Encounter (Signed)
Patient is calling for med refill on pregabalin, says pharmacy told him his last script could not be filled. I have him coming in Thurs at 8:20. He is asking if we can call the pharmacy or send in another script. Please advise patient

## 2021-05-23 ENCOUNTER — Ambulatory Visit
Payer: Medicare Other | Attending: Student in an Organized Health Care Education/Training Program | Admitting: Student in an Organized Health Care Education/Training Program

## 2021-05-23 ENCOUNTER — Encounter: Payer: Self-pay | Admitting: Student in an Organized Health Care Education/Training Program

## 2021-05-23 ENCOUNTER — Other Ambulatory Visit: Payer: Self-pay

## 2021-05-23 VITALS — BP 174/119 | HR 81 | Temp 97.2°F | Resp 18 | Ht 65.0 in | Wt 190.0 lb

## 2021-05-23 DIAGNOSIS — G894 Chronic pain syndrome: Secondary | ICD-10-CM | POA: Insufficient documentation

## 2021-05-23 DIAGNOSIS — M792 Neuralgia and neuritis, unspecified: Secondary | ICD-10-CM | POA: Diagnosis present

## 2021-05-23 DIAGNOSIS — G546 Phantom limb syndrome with pain: Secondary | ICD-10-CM | POA: Diagnosis present

## 2021-05-23 DIAGNOSIS — Z89512 Acquired absence of left leg below knee: Secondary | ICD-10-CM | POA: Diagnosis present

## 2021-05-23 MED ORDER — PREGABALIN 150 MG PO CAPS: 150.0000 mg | ORAL_CAPSULE | Freq: Three times a day (TID) | ORAL | 5 refills | Status: DC

## 2021-05-23 MED ORDER — TIZANIDINE HCL 4 MG PO TABS: 4.0000 mg | ORAL_TABLET | Freq: Three times a day (TID) | ORAL | 5 refills | Status: DC | PRN

## 2021-05-23 NOTE — Progress Notes (Signed)
PROVIDER NOTE: Information contained herein reflects review and annotations entered in association with encounter. Interpretation of such information and data should be left to medically-trained personnel. Information provided to patient can be located elsewhere in the medical record under "Patient Instructions". Document created using STT-dictation technology, any transcriptional errors that may result from process are unintentional.    Patient: David Davies  Service Category: E/M  Provider: Gillis Santa, MD  DOB: [birth year]  DOS: 05/23/2021  Specialty: Interventional Pain Management  MRN: [id number]  Setting: Ambulatory outpatient  PCP: [facility] [place]  Type: Established Patient    Referring Provider: [facility]*  Location: Office  Delivery: Face-to-face     HPI  Mr. David Davies, a 41 y.o. year old male, is here today because of his Phantom limb pain ([place]) [G54.6]. Mr. David Davies primary complain today is Leg Pain and Hip Pain Last encounter: My last encounter with him was on 05/20/2021. Pertinent problems: Mr. David Davies has Phantom limb pain ([place]); Status post below-knee amputation of left lower extremity ([place]); Neuropathic pain; and Chronic pain syndrome on their pertinent problem list. Pain Assessment: Severity of Phantom pain, Neuropathic pain is reported as a 8 /10. Location: Hip Left/Pain radaites from left hip down the left leg into the knee knob and patient also feels phantom pain down the left leg. Onset: More than a month ago. Quality: Constant, Shooting, Penetrating. Timing: Constant. Modifying factor(s): Denies. Vitals:  height is 5' 5" (1.651 m) and weight is 190 lb (86.2 kg). His temporal temperature is 97.2 F (36.2 C) (abnormal). His blood pressure is 174/119 (abnormal) and his pulse is 81. His respiration is 18 and oxygen saturation is 99%.   Reason for encounter: medication management.   History of left below the knee amputation.  Currently on Suboxone  (managed by Suboxone clinic) for history of OUD.  Has severe neuropathic pain secondary to phantom limb pain.  Refill of Lyrica and tizanidine as below.  I have offered the patient trials of TCA which she declined.  I have also offer the patient spinal cord stimulator trial for phantom limb pain which she will consider further.   ROS  Constitutional: Denies any fever or chills Gastrointestinal: No reported hemesis, hematochezia, vomiting, or acute GI distress Musculoskeletal: Denies any acute onset joint swelling, redness, loss of ROM, or weakness Neurological:  Left phantom pain  Medication Review  Buprenorphine HCl-Naloxone HCl, acetaminophen, apixaban, carvedilol, gabapentin, pregabalin, and tiZANidine  History Review  Allergy: Mr. David Davies has No Known Allergies. Drug: Mr. David Davies  reports no history of drug use. Alcohol:  reports no history of alcohol use. Tobacco:  reports that he has never smoked. He has never used smokeless tobacco. Social: Mr. David Davies  reports that he has never smoked. He has never used smokeless tobacco. He reports that he does not drink alcohol and does not use drugs. Medical:  has a past medical history of Chronic back pain, Chronic leg pain, Chronic pain, DVT (deep venous thrombosis) ([place]), Hypertension, and Left leg paresthesias. Surgical: Mr. David Davies  has a past surgical history that includes Thrombectomy; Fasciotomy; and left bka. Family: family history is not on file.  Laboratory Chemistry Profile   Renal Lab Results  Component Value Date   BUN 11 07/05/2020   CREATININE 0.79 07/05/2020   GFR 133.59 08/22/2014   GFRAA >60 12/03/2019   GFRNONAA >60 07/05/2020    Hepatic Lab Results  Component Value Date   AST 59 (H) 07/05/2020   ALT 95 (H) 07/05/2020  ALBUMIN 4.0 07/05/2020   ALKPHOS 71 07/05/2020   LIPASE 54 (H) 07/05/2020    Electrolytes Lab Results  Component Value Date   NA 140 07/05/2020   K 3.9 07/05/2020   CL 104 07/05/2020    CALCIUM 9.4 07/05/2020    Bone No results found for: VD25OH, [id number], BT5176HY0, VP7106YI9, 25OHVITD1, 25OHVITD2, 25OHVITD3, TESTOFREE, TESTOSTERONE  Inflammation (CRP: Acute Phase) (ESR: Chronic Phase) Lab Results  Component Value Date   LATICACIDVEN 1.4 12/15/2014         Note: Above Lab results reviewed.  Recent Imaging Review  DG Chest 2 View CLINICAL DATA:  Chest pain, hypertension  EXAM: CHEST - 2 VIEW  COMPARISON:  06/05/2019  FINDINGS: The heart size and mediastinal contours are within normal limits. Both lungs are clear. The visualized skeletal structures are unremarkable.  IMPRESSION: No active cardiopulmonary disease.  Electronically Signed   By: Fidela Salisbury MD   On: 12/03/2019 01:52 Note: Reviewed        Physical Exam  General appearance: Well nourished, well developed, and well hydrated. In no apparent acute distress Mental status: Alert, oriented x 3 (person, place, & time)       Respiratory: No evidence of acute respiratory distress Eyes: PERLA Vitals: BP (!) 174/119    Pulse 81    Temp (!) 97.2 F (36.2 C) (Temporal)    Resp 18    Ht 5' 5" (1.651 m)    Wt 190 lb (86.2 kg)    SpO2 99%    BMI 31.62 kg/m  BMI: Estimated body mass index is 31.62 kg/m as calculated from the following:   Height as of this encounter: 5' 5" (1.651 m).   Weight as of this encounter: 190 lb (86.2 kg). Ideal: Ideal body weight: 61.5 kg (135 lb 9.3 oz) Adjusted ideal body weight: 71.4 kg (157 lb 5.6 oz)   Left below the knee amputation  Assessment   Status Diagnosis  Persistent Persistent Persistent 1. Phantom limb pain ([place])   2. Status post below-knee amputation of left lower extremity ([place])   3. Neuropathic pain   4. Chronic pain syndrome      Updated Problems: Problem  Phantom Limb Pain ([facility])  Status Post Below-Knee Amputation of Left Lower Extremity ([facility])  Neuropathic Pain  Chronic Pain Syndrome    Plan of Care  Problem-specific:  No  problem-specific Assessment & Plan notes found for this encounter.  Mr. David Davies has a current medication list which includes the following long-term medication(s): apixaban, carvedilol, pregabalin, and [DISCONTINUED] gabapentin.  Pharmacotherapy (Medications Ordered): Meds ordered this encounter  Medications   pregabalin (LYRICA) 150 MG capsule    Sig: Take 1 capsule (150 mg total) by mouth in the morning, at noon, and at bedtime.    Dispense:  90 capsule    Refill:  5   tiZANidine (ZANAFLEX) 4 MG tablet    Sig: Take 1 tablet (4 mg total) by mouth every 8 (eight) hours as needed for muscle spasms.    Dispense:  90 tablet    Refill:  5   Consider spinal cord stimulation in future  Follow-up plan:   Return in about 6 months (around 11/20/2021) for Medication Management, virtual.    Recent Visits No visits were found meeting these conditions. Showing recent visits within past 90 days and meeting all other requirements Today's Visits Date Type Provider Dept  05/23/21 Office Visit Gillis Santa, MD [facility]  Showing today's visits and meeting all other requirements  Future Appointments No visits were found meeting these conditions. Showing future appointments within next 90 days and meeting all other requirements  I discussed the assessment and treatment plan with the patient. The patient was provided an opportunity to ask questions and all were answered. The patient agreed with the plan and demonstrated an understanding of the instructions.  Patient advised to call back or seek an in-person evaluation if the symptoms or condition worsens.  Duration of encounter: 56mnutes.  Note by: BGillis Santa MD Date: 05/23/2021; Time: 9:02 AM

## 2021-05-23 NOTE — Progress Notes (Signed)
Safety precautions to be maintained throughout the outpatient stay will include: orient to surroundings, keep bed in low position, maintain call bell within reach at all times, provide assistance with transfer out of bed and ambulation.  

## 2021-11-14 ENCOUNTER — Ambulatory Visit
Payer: Medicare Other | Attending: Student in an Organized Health Care Education/Training Program | Admitting: Student in an Organized Health Care Education/Training Program

## 2021-11-14 DIAGNOSIS — Z89512 Acquired absence of left leg below knee: Secondary | ICD-10-CM | POA: Diagnosis not present

## 2021-11-14 DIAGNOSIS — G894 Chronic pain syndrome: Secondary | ICD-10-CM | POA: Diagnosis not present

## 2021-11-14 DIAGNOSIS — M792 Neuralgia and neuritis, unspecified: Secondary | ICD-10-CM | POA: Diagnosis not present

## 2021-11-14 DIAGNOSIS — G546 Phantom limb syndrome with pain: Secondary | ICD-10-CM

## 2021-11-14 MED ORDER — TIZANIDINE HCL 4 MG PO TABS: 4.0000 mg | ORAL_TABLET | Freq: Three times a day (TID) | ORAL | 5 refills | Status: DC | PRN

## 2021-11-14 MED ORDER — PREGABALIN 150 MG PO CAPS: 150.0000 mg | ORAL_CAPSULE | Freq: Three times a day (TID) | ORAL | 5 refills | Status: DC

## 2021-11-14 NOTE — Progress Notes (Signed)
Patient: David Davies  Service Category: E/M  Provider: Gillis Santa, MD  DOB: Aug 24, 1980  DOS: 11/14/2021  Location: Office  MRN: [id number]  Setting: Ambulatory outpatient  Referring Provider: [facility] [place]*  Type: Established Patient  Specialty: Interventional Pain Management  PCP: [facility]  Location: Remote location  Delivery: TeleHealth     Virtual Encounter - Pain Management PROVIDER NOTE: Information contained herein reflects review and annotations entered in association with encounter. Interpretation of such information and data should be left to medically-trained personnel. Information provided to patient can be located elsewhere in the medical record under "Patient Instructions". Document created using STT-dictation technology, any transcriptional errors that may result from process are unintentional.    Contact & Pharmacy Preferred: [phone] Home: [phone] (home) Mobile: [phone] (mobile) E-mail: [id number] [address], [place] AT [place] [address] Phone: [phone] Fax: [phone]   Pre-screening  David Davies offered "in-person" vs "virtual" encounter. He indicated preferring virtual for this encounter.   Reason COVID-19*  Social distancing based on CDC and AMA recommendations.   I contacted Cadel Stairs on 11/14/2021 via telephone.      I clearly identified myself as Gillis Santa, MD. I verified that I was speaking with the correct person using two identifiers (Name: David Davies, and date of birth: [birth year]).  Consent I sought verbal advanced consent from [facility] for virtual visit interactions. I informed David Davies of possible security and privacy concerns, risks, and limitations associated with providing "not-in-person" medical evaluation and management services. I also informed David Davies of the  availability of "in-person" appointments. Finally, I informed him that there would be a charge for the virtual visit and that he could be  personally, fully or partially, financially responsible for it. David Davies expressed understanding and agreed to proceed.   Historic Elements   David Davies is a 41 y.o. year old, male patient evaluated today after our last contact on 05/23/2021. David Davies  has a past medical history of Chronic back pain, Chronic leg pain, Chronic pain, DVT (deep venous thrombosis) ([place]), Hypertension, and Left leg paresthesias. He also  has a past surgical history that includes Thrombectomy; Fasciotomy; and left bka. David Davies has a current medication list which includes the following prescription(s): apixaban, buprenorphine hcl-naloxone hcl, carvedilol, acetaminophen, pregabalin, tizanidine, and [DISCONTINUED] gabapentin. He  reports that he has never smoked. He has never used smokeless tobacco. He reports that he does not drink alcohol and does not use drugs. David Davies has No Known Allergies.   HPI  Today, he is being contacted for medication management.  No change in medical history since last visit.  Patient's pain is at baseline.  Patient continues multimodal pain regimen as prescribed.  States that it provides pain relief and improvement in functional status.  Currently on Suboxone (managed by Suboxone clinic) for history of OUD.  Has severe neuropathic pain secondary to phantom limb pain.  Refill of Lyrica and tizanidine as below.  I have also offer the patient spinal cord stimulator trial for phantom limb pain which he will consider further.   Laboratory Chemistry Profile   Renal Lab Results  Component Value Date   BUN 11 07/05/2020   CREATININE 0.79 07/05/2020   GFR 133.59 08/22/2014   GFRAA >60 12/03/2019   GFRNONAA >60 07/05/2020    Hepatic Lab Results  Component Value Date   AST  59 (H) 07/05/2020   ALT 95 (H) 07/05/2020   ALBUMIN 4.0 07/05/2020    ALKPHOS 71 07/05/2020   LIPASE 54 (H) 07/05/2020    Electrolytes Lab Results  Component Value Date   NA 140 07/05/2020   K 3.9 07/05/2020   CL 104 07/05/2020   CALCIUM 9.4 07/05/2020    Bone No results found for: "VD25OH", "VD125OH2TOT", "FH2197JO8", "TG5498YM4", "25OHVITD1", "25OHVITD2", "15AXENMM7", "TESTOFREE", "TESTOSTERONE"  Inflammation (CRP: Acute Phase) (ESR: Chronic Phase) Lab Results  Component Value Date   LATICACIDVEN 1.4 12/15/2014         Note: Above Lab results reviewed.  Imaging  DG Chest 2 View CLINICAL DATA:  Chest pain, hypertension  EXAM: CHEST - 2 VIEW  COMPARISON:  06/05/2019  FINDINGS: The heart size and mediastinal contours are within normal limits. Both lungs are clear. The visualized skeletal structures are unremarkable.  IMPRESSION: No active cardiopulmonary disease.  Electronically Signed   By: Fidela Salisbury MD   On: 12/03/2019 01:52  Assessment  Diagnoses of Phantom limb pain ([place]), Status post below-knee amputation of left lower extremity ([place]), Neuropathic pain, and Chronic pain syndrome were pertinent to this visit.  Plan of Care    David Davies has a current medication list which includes the following long-term medication(s): apixaban, carvedilol, pregabalin, and [DISCONTINUED] gabapentin.  Pharmacotherapy (Medications Ordered): Meds ordered this encounter  Medications   pregabalin (LYRICA) 150 MG capsule    Sig: Take 1 capsule (150 mg total) by mouth in the morning, at noon, and at bedtime.    Dispense:  90 capsule    Refill:  5   tiZANidine (ZANAFLEX) 4 MG tablet    Sig: Take 1 tablet (4 mg total) by mouth every 8 (eight) hours as needed for muscle spasms.    Dispense:  90 tablet    Refill:  5   Orders:  No orders of the defined types were placed in this encounter.  Follow-up plan:   Return in about 6 months (around 05/17/2022) for Medication Management F2F.    Recent Visits No visits were found meeting  these conditions. Showing recent visits within past 90 days and meeting all other requirements Today's Visits Date Type Provider Dept  11/14/21 Office Visit Gillis Santa, MD [facility]  Showing today's visits and meeting all other requirements Future Appointments No visits were found meeting these conditions. Showing future appointments within next 90 days and meeting all other requirements  I discussed the assessment and treatment plan with the patient. The patient was provided an opportunity to ask questions and all were answered. The patient agreed with the plan and demonstrated an understanding of the instructions.  Patient advised to call back or seek an in-person evaluation if the symptoms or condition worsens.  Duration of encounter: 65mnutes.  Note by: BGillis Santa MD Date: 11/14/2021; Time: 3:31 PM

## 2021-12-19 ENCOUNTER — Ambulatory Visit: Payer: Medicare Other | Admitting: Student in an Organized Health Care Education/Training Program

## 2021-12-20 ENCOUNTER — Ambulatory Visit (INDEPENDENT_AMBULATORY_CARE_PROVIDER_SITE_OTHER): Payer: Medicare Other | Admitting: Adult Health

## 2021-12-20 ENCOUNTER — Encounter: Payer: Self-pay | Admitting: Adult Health

## 2021-12-20 VITALS — BP 142/82 | HR 82 | Temp 97.5°F | Resp 18 | Ht 66.0 in | Wt 188.0 lb

## 2021-12-20 DIAGNOSIS — I10 Essential (primary) hypertension: Secondary | ICD-10-CM

## 2021-12-20 DIAGNOSIS — E1149 Type 2 diabetes mellitus with other diabetic neurological complication: Secondary | ICD-10-CM

## 2021-12-20 DIAGNOSIS — M792 Neuralgia and neuritis, unspecified: Secondary | ICD-10-CM

## 2021-12-20 DIAGNOSIS — G894 Chronic pain syndrome: Secondary | ICD-10-CM

## 2021-12-20 DIAGNOSIS — K8591 Acute pancreatitis with uninfected necrosis, unspecified: Secondary | ICD-10-CM | POA: Diagnosis not present

## 2021-12-20 DIAGNOSIS — Z86718 Personal history of other venous thrombosis and embolism: Secondary | ICD-10-CM | POA: Diagnosis not present

## 2021-12-20 MED ORDER — METFORMIN HCL 500 MG PO TABS: 500.0000 mg | ORAL_TABLET | Freq: Every day | ORAL | 3 refills | Status: DC

## 2021-12-20 MED ORDER — APIXABAN 5 MG PO TABS: 5.0000 mg | ORAL_TABLET | Freq: Two times a day (BID) | ORAL | 0 refills | Status: DC

## 2021-12-20 MED ORDER — OXYCODONE HCL 5 MG PO TABS: 5.0000 mg | ORAL_TABLET | Freq: Four times a day (QID) | ORAL | 0 refills | Status: DC | PRN

## 2021-12-20 MED ORDER — CARVEDILOL 12.5 MG PO TABS: 12.5000 mg | ORAL_TABLET | Freq: Two times a day (BID) | ORAL | 3 refills | Status: DC

## 2021-12-20 MED ORDER — PREGABALIN 150 MG PO CAPS: 150.0000 mg | ORAL_CAPSULE | Freq: Three times a day (TID) | ORAL | Status: DC

## 2021-12-20 NOTE — Progress Notes (Signed)
Location:  [facility]  Provider:  Kenard Gower DNP  Code Status:  Full Code  Goals of Care:     12/20/2021   10:18 AM  Advanced Directives  Does Patient Have a Medical Advance Directive? No  Would patient like information on creating a medical advance directive? No - Patient declined     Chief Complaint  Patient presents with  . Establish Care    Patient is here to establish care and has medication bottles at time of initial appointment    HPI: Patient is a 41 y.o. male seen today to establish care with [facility]. He has a PMH of chronic back pain, chronic leg pain, DVT and hypertension. He was recently hospitalized  Past Medical History:  Diagnosis Date  . Chronic back pain   . Chronic leg pain    left  . Chronic pain   . DVT (deep venous thrombosis) (HCC)   . Hypertension   . Left leg paresthesias     Past Surgical History:  Procedure Laterality Date  . FASCIOTOMY    . left bka    . THROMBECTOMY      No Known Allergies  Outpatient Encounter Medications as of 12/20/2021  Medication Sig  . apixaban (ELIQUIS) 5 MG TABS tablet Take 1 tablet (5 mg total) by mouth 2 (two) times daily.  . Buprenorphine HCl-Naloxone HCl 8-2 MG FILM Place under the tongue.  . carvedilol (COREG) 25 MG tablet Take 25 mg by mouth 2 (two) times daily with a meal.  . tiZANidine (ZANAFLEX) 4 MG tablet Take 1 tablet (4 mg total) by mouth every 8 (eight) hours as needed for muscle spasms.  . [DISCONTINUED] acetaminophen (TYLENOL) 325 MG tablet Take by mouth. (Patient not taking: Reported on 05/23/2021)  . [DISCONTINUED] gabapentin (NEURONTIN) 300 MG capsule Take 1 capsule (300 mg total) by mouth 3 (three) times daily. (Patient not taking: Reported on 12/15/2014)  . [DISCONTINUED] pregabalin (LYRICA) 150 MG capsule Take 1 capsule (150 mg total) by mouth in the morning, at noon, and at bedtime.   No facility-administered encounter medications on file as of 12/20/2021.    Review of Systems:   Review of Systems  Health Maintenance  Topic Date Due  . COVID-19 Vaccine (1) Never done  . HIV Screening  Never done  . Hepatitis C Screening  Never done  . INFLUENZA VACCINE  Never done  . TETANUS/TDAP  04/15/2025  . HPV VACCINES  Aged Out    Physical Exam: Vitals:   12/20/21 1008  BP: (!) 142/82  Pulse: 82  Resp: 18  Temp: (!) 97.5 F (36.4 C)  TempSrc: Temporal  SpO2: 98%  Weight: 188 lb (85.3 kg)  Height: 5\' 6"  (1.676 m)   Body mass index is 30.34 kg/m. Physical Exam  Labs reviewed: Basic Metabolic Panel: No results for input(s): "NA", "K", "CL", "CO2", "GLUCOSE", "BUN", "CREATININE", "CALCIUM", "MG", "PHOS", "TSH" in the last 8760 hours. Liver Function Tests: No results for input(s): "AST", "ALT", "ALKPHOS", "BILITOT", "PROT", "ALBUMIN" in the last 8760 hours. No results for input(s): "LIPASE", "AMYLASE" in the last 8760 hours. No results for input(s): "AMMONIA" in the last 8760 hours. CBC: No results for input(s): "WBC", "NEUTROABS", "HGB", "HCT", "MCV", "PLT" in the last 8760 hours. Lipid Panel: No results for input(s): "CHOL", "HDL", "LDLCALC", "TRIG", "CHOLHDL", "LDLDIRECT" in the last 8760 hours. No results found for: "HGBA1C"  Procedures since last visit: No results found.  Assessment/Plan There are no diagnoses linked to this encounter.  Labs/tests ordered:  * No order type specified * Next appt:  Visit date not found

## 2021-12-20 NOTE — Patient Instructions (Signed)

## 2021-12-23 ENCOUNTER — Encounter: Payer: Self-pay | Admitting: Adult Health

## 2021-12-25 ENCOUNTER — Other Ambulatory Visit: Payer: Self-pay

## 2021-12-25 MED ORDER — BLOOD GLUCOSE MONITOR KIT: PACK | 0 refills | Status: AC

## 2022-01-02 ENCOUNTER — Other Ambulatory Visit: Payer: Self-pay | Admitting: Gastroenterology

## 2022-01-02 ENCOUNTER — Telehealth: Payer: Self-pay

## 2022-01-02 ENCOUNTER — Encounter: Payer: Self-pay | Admitting: Gastroenterology

## 2022-01-02 ENCOUNTER — Ambulatory Visit (INDEPENDENT_AMBULATORY_CARE_PROVIDER_SITE_OTHER): Payer: Medicare Other | Admitting: Gastroenterology

## 2022-01-02 VITALS — BP 106/90 | HR 81 | Ht 66.0 in | Wt 185.2 lb

## 2022-01-02 DIAGNOSIS — K8501 Idiopathic acute pancreatitis with uninfected necrosis: Secondary | ICD-10-CM

## 2022-01-02 DIAGNOSIS — K859 Acute pancreatitis without necrosis or infection, unspecified: Secondary | ICD-10-CM

## 2022-01-02 DIAGNOSIS — G894 Chronic pain syndrome: Secondary | ICD-10-CM

## 2022-01-02 MED ORDER — FAMOTIDINE 20 MG PO TABS: 20.0000 mg | ORAL_TABLET | Freq: Every day | ORAL | 1 refills | Status: DC

## 2022-01-02 NOTE — Telephone Encounter (Signed)
Incoming call received from patients spouse questioning if Jaymes Graff has addressed the referral denial for the pain clinic.   Mrs.Fifer also asked why referral was denied, as the pain management clinic would not tell her and directed her to f/u with patients PCP.  See referral notes. Mrs.Pyka states patient goes to a suboxone clinic already and that did not need to be part of the referral notes and he will need to be referred else where for management of pancreatitis and neurological pain.   Please review and advise.   Side note: Mrs.Paster is expecting a return call with a status update on Monina's reply

## 2022-01-02 NOTE — Patient Instructions (Signed)
If you are age 40 or older, your body mass index should be between 23-30. Your Body mass index is 29.9 kg/m. If this is out of the aforementioned range listed, please consider follow up with your Primary Care Provider.  If you are age 37 or younger, your body mass index should be between 19-25. Your Body mass index is 29.9 kg/m. If this is out of the aformentioned range listed, please consider follow up with your Primary Care Provider.   We have sent the following medications to your pharmacy for you to pick up at your convenience: Pepcid 20 mg daily.  You have been scheduled for an abdominal ultrasound at [facility] on 01/06/22 at 9am. Please arrive 15 minutes prior to your appointment for registration. Make certain not to have anything to eat or drink 6 hours prior to your appointment. Should you need to reschedule your appointment, please contact radiology at [phone]. This test typically takes about 30 minutes to perform.  You have been scheduled for a CT scan of the abdomen and pelvis at [facility] [address], 1st floor Radiology. You are scheduled on 01/29/55 at 8 am. You should arrive 15 minutes prior to your appointment time for registration.  We are giving you 2 bottles of contrast today that you will need to drink before arriving for the exam. The solution may taste better if refrigerated so put them in the refrigerator when you get home, but do NOT add ice or any other liquid to this solution as that would dilute it. Shake well before drinking.   Please follow the written instructions below on the day of your exam:   1) Do not eat anything after 4am (4 hours prior to your test)   2) Drink 1 bottle of contrast @ 6am (2 hours prior to your exam)  Remember to shake well before drinking and do NOT pour over ice.     Drink 1 bottle of contrast @ 7am (1 hour prior to your exam)   You may take any medications as prescribed with a small amount of water,  if necessary. If you take any of the following medications: METFORMIN, GLUCOPHAGE, GLUCOVANCE, AVANDAMET, RIOMET, FORTAMET, [place] MET, JANUMET, GLUMETZA or METAGLIP, you MAY be asked to HOLD this medication 48 hours AFTER the exam.   The purpose of you drinking the oral contrast is to aid in the visualization of your intestinal tract. The contrast solution may cause some diarrhea. Depending on your individual set of symptoms, you may also receive an intravenous injection of x-ray contrast/dye. Plan on being at [facility] for 45 minutes or longer, depending on the type of exam you are having performed.   If you have any questions regarding your exam or if you need to reschedule, you may call [facility] at [phone] between the hours of 8:00 am and 5:00 pm, Monday-Friday.   The [place] [facility] providers would like to encourage you to use [facility] to communicate with providers for non-urgent requests or questions.  Due to long hold times on the telephone, sending your provider a message by [facility] may be a faster and more efficient way to get a response.  Please allow 48 business hours for a response.  Please remember that this is for non-urgent requests.   It was a pleasure to see you today!  Thank you for trusting me with your gastrointestinal care!    Scott E.Candis Schatz, MD

## 2022-01-02 NOTE — Progress Notes (Signed)
HPI : David Davies is a very pleasant 41 year old male with a history of peripheral artery disease status post left BKA and chronic pain from phantom limb syndrome who is referred to [place] by [facility] for further evaluation after recent admission from August 30 to September 5 for acute necrotizing pancreatitis.  Taken from patient's discharge summary: "Presenting with 1 week of lower abdominal bloating and fullness, intermittent N/V, loose stools, and poor PO intake. CT abdomen/pelvis w/ evidence of acute pancreatitis with apparent areas of hypoenhancement in the distal body and tail concerning for necrosis, plus extensive fluid along the distal body and tail tracking down the left paracolic gutter concerning for developing peripancreatic collections. Lipase mildly elevated at 98. Hemodynamically stable, afebrile, and w/o leukocytosis. Etiology likely idiopathic vs alcohol related. No RUQ pain, low suspicion for gallstone pancreatitis. Triglycerides mildly elevated at 196. No offending meds. Pain was challenging to manage, so repeat abdominal CT on 12/15/2021 showed similar findings of acute pancreatitis with hypoenhancement of the distal pancreatic body and tail concerning for necrosis and extensive peripancreatic fluid extending along the left paracolic gutter. Improved gradually with fluids and gradual advancement of diet. Was noted to require significant amount of dilaudid and then oxycodone for pain control."  Since his discharge, he has had persistent abdominal pain.  Initially it was resolved, but has become more of a problem in the last week or so.  He still rates the pain at about 7 out of 10 in severity at its most severe.  This is much improved compared to the pain he was having in the hospital low.  He said the pain is always there, but varies in severity.  Pain is not worse with eating.  He is tolerating a "light" diet.  He is avoiding heavy foods.  He has not had any vomiting since he  was in the hospital.  He does feel nauseated sometimes in the morning.  He continues to have lots of chest discomfort and bloating, which was diagnosed as GERD.  He is not taking any acid suppressing medicine currently.  He reports regular bowel movements, without any problems with constipation, diarrhea or blood in the stool.  He lost about 10 pounds while in the hospital, and has not regained much of that since discharge.  The patient has not drank any alcohol since his hospitalization.  Prior to that, he states he would drink occasionally, mostly on the weekends.  He would typically drink hard teas and beer, typically 2 to 324 ounce cans on a weekend day. He states that many years ago he would drink much heavier and also smokes, but stopped drinking heavily and stopped smoking completely in 2017.  He reports no family history of pancreatitis or pancreatic cancer.    Past Medical History:  Diagnosis Date   Chronic back pain    Chronic leg pain    left   Chronic pain    DVT (deep venous thrombosis) (HCC)    Hypertension    Left leg paresthesias   Diabetes (diagnosed Aug 2023 at time of admission for pancreatitis) Acute pancreatitis   Past Surgical History:  Procedure Laterality Date   FASCIOTOMY     left bka     THROMBECTOMY     History reviewed. No pertinent family history. Social History   Tobacco Use   Smoking status: Never   Smokeless tobacco: Never  Vaping Use   Vaping Use: Never used  Substance Use Topics   Alcohol use: No  Drug use: No   Current Outpatient Medications  Medication Sig Dispense Refill   apixaban (ELIQUIS) 5 MG TABS tablet Take 1 tablet (5 mg total) by mouth 2 (two) times daily. 60 tablet 0   blood glucose meter kit and supplies KIT Dispense based on patient and insurance preference.Test blood sugar 3 times a day DX: E11.49 1 each 0   Buprenorphine HCl-Naloxone HCl 8-2 MG FILM Place under the tongue.     carvedilol (COREG) 12.5 MG tablet Take 1  tablet (12.5 mg total) by mouth 2 (two) times daily with a meal. 60 tablet 3   metFORMIN (GLUCOPHAGE) 500 MG tablet Take 1 tablet (500 mg total) by mouth daily with breakfast. 3 tablet 3   oxyCODONE (ROXICODONE) 5 MG immediate release tablet Take 1 tablet (5 mg total) by mouth every 6 (six) hours as needed for severe pain. 20 tablet 0   pregabalin (LYRICA) 150 MG capsule Take 1 capsule (150 mg total) by mouth 3 (three) times daily.     tiZANidine (ZANAFLEX) 4 MG tablet Take 1 tablet (4 mg total) by mouth every 8 (eight) hours as needed for muscle spasms. (Patient not taking: Reported on 01/02/2022) 90 tablet 5   No current facility-administered medications for this visit.   No Known Allergies   Review of Systems: All systems reviewed and negative except where noted in HPI.    No results found.  Physical Exam: BP (!) 106/90   Pulse 81   Ht 5' 6"  (1.676 m)   Wt 185 lb 4 oz (84 kg)   BMI 29.90 kg/m  Constitutional: Pleasant,well-developed, African-American male in no acute distress. HEENT: Normocephalic and atraumatic. Conjunctivae are normal. No scleral icterus. Neck supple.  Cardiovascular: Normal rate, regular rhythm.  Pulmonary/chest: Effort normal and breath sounds normal. No wheezing, rales or rhonchi. Abdominal: Soft, nondistended, diffuse mild tenderness to palpation, most notable in the epigastrium, no rigidity or guarding bowel sounds active throughout. There are no masses palpable. No hepatomegaly. Extremities: no edema, status post left BKA Neurological: Alert and oriented to person place and time. Skin: Skin is warm and dry. No rashes noted. Psychiatric: Normal mood and affect. Behavior is normal.  CBC    Component Value Date/Time   WBC 4.3 07/05/2020 1056   RBC 5.24 07/05/2020 1056   HGB 15.5 07/05/2020 1056   HCT 44.6 07/05/2020 1056   PLT 288 07/05/2020 1056   MCV 85.1 07/05/2020 1056   MCH 29.6 07/05/2020 1056   MCHC 34.8 07/05/2020 1056   RDW 13.2  07/05/2020 1056   LYMPHSABS 2.3 05/07/2019 1314   MONOABS 0.5 05/07/2019 1314   EOSABS 0.1 05/07/2019 1314   BASOSABS 0.0 05/07/2019 1314    CMP     Component Value Date/Time   NA 140 07/05/2020 1056   K 3.9 07/05/2020 1056   CL 104 07/05/2020 1056   CO2 24 07/05/2020 1056   GLUCOSE 138 (H) 07/05/2020 1056   BUN 11 07/05/2020 1056   CREATININE 0.79 07/05/2020 1056   CALCIUM 9.4 07/05/2020 1056   PROT 8.6 (H) 07/05/2020 1056   ALBUMIN 4.0 07/05/2020 1056   AST 59 (H) 07/05/2020 1056   ALT 95 (H) 07/05/2020 1056   ALKPHOS 71 07/05/2020 1056   BILITOT 1.1 07/05/2020 1056   GFRNONAA >60 07/05/2020 1056   GFRAA >60 12/03/2019 0041   EXAM: CT ABDOMEN PELVIS W CONTRAST DATE: 12/15/2021 3:14 PM ACCESSION: [id number] UN DICTATED: 12/15/2021 3:22 PM INTERPRETATION LOCATION: [place] CLINICAL INDICATION: 41 years old  with pt with lower abd pain, increasing from prior has pancreatitis COMPARISON: CT abdomen and pelvis with contrast 4/00/8676 TECHNIQUE: A helical CT scan of the abdomen and pelvis was obtained following IV contrast from the lung bases through the pubic symphysis. Images were reconstructed in the axial plane. Coronal and sagittal reformatted images were also provided for further evaluation. FINDINGS: LOWER CHEST: Trace left pleural effusion. LIVER: Normal liver contour. No focal liver lesions. Subtle wedge-shaped regions of hypoenhancement along the anterior hepatic dome (2:21, 23), not definitively seen on prior and favored to be perfusional. BILIARY: The gallbladder is normal in appearance. No biliary ductal dilatation. SPLEEN: Normal in size and contour, small splenules present. PANCREAS: Similar appearance of diffuse peripancreatic inflammatory changes predominantly surrounding the tail of the pancreas, areas of hypoattenuating pancreatic parenchyma, and peripancreatic fluid tracking down the left paracolic gutter. ADRENAL GLANDS: Normal appearance of the adrenal glands.  KIDNEYS/URETERS: Symmetric renal enhancement. No hydronephrosis. No solid renal mass. Subcentimeter right renal hypodensity, too small to characterize. BLADDER: Urinary bladder is decompressed, limiting evaluation.. Question small lipoma in the wall of the bladder dome. REPRODUCTIVE ORGANS: Unremarkable prostate and seminal vesicles. GI TRACT: Inflammatory changes along the greater curvature of the stomach and splenic flexure colon, presumably reactive. No findings of bowel obstruction. Colonic diverticulosis. Normal appendix (5:78). PERITONEUM, RETROPERITONEUM AND MESENTERY: No free air. No ascites. LYMPH NODES: No adenopathy. VESSELS: Hepatic and portal veins are patent. Normal caliber aorta. BONES and SOFT TISSUES: Right hip total arthroplasty with associated streak artifact. Left femoral head avascular necrosis. No aggressive osseous lesions. Small fat-containing umbilical hernia. No focal soft tissue lesions.   Similar findings of acute pancreatitis with hypoenhancement of the distal pancreatic body and tail concerning for necrosis and extensive peripancreatic fluid extending along the left paracolic gutter.     Result Date: 12/11/2021 EXAM: CT ABDOMEN PELVIS W CONTRAST DATE: 12/11/2021 6:52 PM ACCESSION: [id number] UN DICTATED: 12/11/2021 6:57 PM INTERPRETATION LOCATION: [place] CLINICAL INDICATION: 41 years old with abd pain, bloating, n/v/d ; Abdominal pain, acute, nonlocalized COMPARISON: None TECHNIQUE: A helical CT scan of the abdomen and pelvis was obtained following IV contrast from the lung bases through the pubic symphysis. Images were reconstructed in the axial plane. Coronal and sagittal reformatted images were also provided for further evaluation. FINDINGS: LOWER CHEST: Unremarkable. LIVER: The liver appears low in attenuation, likely hepatic steatosis. No focal liver lesions. BILIARY: The gallbladder is normal in appearance. No biliary ductal dilatation. SPLEEN: Normal in size and  contour. PANCREAS: Marked peripancreatic inflammatory changes in fluid surrounding the tail of the pancreas. Subtle areas of hypoattenuation of the pancreatic parenchyma. There is fluid tracking down the left paracolic gutter. No ductal dilation. ADRENAL GLANDS: Normal appearance of the adrenal glands. KIDNEYS/URETERS: Subcentimeter low-attenuation lesion too small to characterize in the right kidney. Symmetric renal enhancement. No hydronephrosis. No solid renal mass. BLADDER: Nonspecific thickening of the urinary bladder. Subcentimeter focus of fat attenuation in the bladder dome (series 4#66), likely a lipoma. REPRODUCTIVE ORGANS: Unremarkable. GI TRACT: There are inflammatory changes along the posterior wall the stomach, presumably reactive in nature. The loops of small bowel are normal in caliber. There is no evidence of acute colonic pathology. Normal-appearing appendix. PERITONEUM, RETROPERITONEUM AND MESENTERY: No free air. Right-sided fluid tracking down the paracolic gutter. No discrete drainable fluid collection. LYMPH NODES: No adenopathy. VESSELS: Hepatic and portal veins are patent. Normal caliber aorta. Trace calcified atherosclerotic disease. The splenic vein is patent. BONES and SOFT TISSUES: Right hip total arthroplasty with  streak artifact limiting evaluation of the pelvic structures. Left femoral avascular necrosis. Degenerative changes of the spine. Small fat-containing umbilical hernia.   Acute pancreatitis with apparent areas of hypoenhancement in the distal body and tail concerning for necrosis. There is extensive fluid along the distal body and tail tracking down the left paracolic gutter concerning for developing peripancreatic collections. Nonspecific thickening of the urinary bladder. Clinical correlation is recommended to exclude cystitis. Trace calcified atherosclerotic disease, greater than expected for age.   ASSESSMENT AND PLAN: 41 year old male with history of heavy alcohol use  and tobacco use, peripheral artery disease and arterial embolism status post left BKA with recent admission for severe necrotizing pancreatitis.  Patient's pain has been increasing last few weeks.  He still able to eat a regular diet and has no vomiting. Given the extensive intra-abdominal fluid and necrosis, he is high risk for pseudocyst formation.  With his persistent symptoms, I would like to repeat a CT scan in about 3 weeks to see if there is a pseudocyst may be amenable to treatment. Although alcohol may be the most likely etiology for his pancreatitis, I do not see that he had an ultrasound during his admission to more definitively exclude gallstones.  We will obtain a right upper quadrant ultrasound to assess for gallstones as well as for fatty change of liver. We discussed what the pancreatitis is, and the most common causes.  He was recommended to avoid alcohol and continue avoid smoking.  He should continue on a low-fat diet until his symptoms are completely resolved.  Deferred to his pain management provider for management of his chronic pain as well as pain related to resolving pancreatitis.  Acute pancreatitis, unclear etiology, possible alcohol -RUQUS to evaluate for gallstones and evidence of alcohol induced liver disease -Repeat CT abd/pelvis in 3 weeks to assess for potential pseudocyst this because of patient's persistent abdominal pain -Recommend complete alcohol abstinence, continue avoiding tobacco  Snyder Colavito E. Candis Schatz, MD [facility]    CC:  Medina-Vargas, Senaida Lange*

## 2022-01-03 NOTE — Telephone Encounter (Signed)
Medina-Vargas, Monina C, NP  [facility] Clinical Pool 7 hours ago (12:05 AM)   I have ordered ambulatory referral for pain management but I don't know why it was denied. How did they know that it was denied?

## 2022-01-03 NOTE — Telephone Encounter (Signed)
Referral Notes Number of Notes: 4 . Type Date User Summary Attachment  General 12/30/2021 10:31 AM Cindie Crumbly B This referral was denied , we do not RX Suboxone or treat pancreatis pain. Thank you so much for your referral! -  Note:   This referral was denied , we do not RX Suboxone or treat pancreatis pain. Thank you so much for your referral!     Please Advise.

## 2022-01-03 NOTE — Telephone Encounter (Signed)
New Referral was placed by Monina to [facility] for pain management.   Patient's spouse called and left message on Clinical intake requesting an update but DPR states that we can ONLY speak to patient.   Tried calling patient's number and LMOM to return call for status update.

## 2022-01-06 ENCOUNTER — Ambulatory Visit: Admission: RE | Admit: 2022-01-06 | Payer: Medicare Other | Source: Ambulatory Visit

## 2022-01-10 NOTE — Telephone Encounter (Signed)
Patient called and stated that the [facility] contacted him and stated that they are not a Pain Clinic and gave him a number to [facility] instead.   Patient stated that he needs a referral placed to the [facility].

## 2022-01-10 NOTE — Telephone Encounter (Signed)
Patient wife called and complained that ([facility]) is not a pain clinic. This facility is a mental health clinic. Patient wife is highly frustrated due to patient waiting 1 month to have referral properly completed. I notified wife  that we are doing our best to get things in order for her husband. Message routed back to PCP Medina-Vargas, Senaida Lange, NP

## 2022-01-10 NOTE — Addendum Note (Signed)
Addended by: Rafael Bihari A on: 01/10/2022 12:00 PM   Modules accepted: Orders

## 2022-01-10 NOTE — Addendum Note (Signed)
Addended by: Durenda Age C on: 01/10/2022 04:48 PM   Modules accepted: Orders

## 2022-01-15 ENCOUNTER — Telehealth: Payer: Self-pay | Admitting: *Deleted

## 2022-01-15 NOTE — Telephone Encounter (Signed)
Patient called and left message on Clinical intake stating that paperwork was faxed from [place].   Patient's message was in an out and couldn't hear the whole message.   Called patient back and left message on Voicemail to return call or have the paperwork refaxed.

## 2022-01-16 ENCOUNTER — Ambulatory Visit: Payer: Medicare Other | Admitting: Family Medicine

## 2022-01-22 ENCOUNTER — Telehealth: Payer: Self-pay

## 2022-01-22 NOTE — Telephone Encounter (Signed)
Tried calling patient to schedule an appointment to have paperwork sent to company that provides prothesis because David Davies needs to have proper documentation in order to sign paperwork. I left vm for patient to call office.

## 2022-01-24 ENCOUNTER — Telehealth: Payer: Self-pay

## 2022-01-24 NOTE — Telephone Encounter (Signed)
Patients wife Medical laboratory scientific officer) called about an order that was faxed for patient to receive a prostheses leg. She was advised that the [employer] is requiring patient to be seen in office by Monina Medina-Vargas,NP first to have the order completed. Wife scheduled appointment on 01/27/22 for patient.

## 2022-01-27 ENCOUNTER — Encounter: Payer: Self-pay | Admitting: Adult Health

## 2022-01-27 ENCOUNTER — Ambulatory Visit (INDEPENDENT_AMBULATORY_CARE_PROVIDER_SITE_OTHER): Payer: Medicare Other | Admitting: Adult Health

## 2022-01-27 ENCOUNTER — Encounter: Payer: Medicare Other | Admitting: Adult Health

## 2022-01-27 VITALS — BP 130/82 | HR 83 | Temp 97.0°F | Resp 18 | Ht 65.0 in | Wt 182.0 lb

## 2022-01-27 DIAGNOSIS — Z89512 Acquired absence of left leg below knee: Secondary | ICD-10-CM

## 2022-01-27 DIAGNOSIS — Z86718 Personal history of other venous thrombosis and embolism: Secondary | ICD-10-CM | POA: Diagnosis not present

## 2022-01-27 DIAGNOSIS — E1149 Type 2 diabetes mellitus with other diabetic neurological complication: Secondary | ICD-10-CM

## 2022-01-27 NOTE — Progress Notes (Unsigned)
[facility] clinic  Provider: Durenda Age DNP  Code Status:  Full Code  Goals of Care:     12/20/2021   10:18 AM  Advanced Directives  Does Patient Have a Medical Advance Directive? No  Would patient like information on creating a medical advance directive? No - Patient declined     Chief Complaint  Patient presents with  . Medical Management of Chronic Issues    Patient is here for medical orders    HPI: Patient is a 41 y.o. male seen today for an acute visit for   Today, we saw David Davies for evaluation of his prosthesis. He is highly motivated to wal with a prosthesis, and is expected to do more than simple walking. He is a highl  Past Medical History:  Diagnosis Date  . Chronic back pain   . Chronic leg pain    left  . Chronic pain   . DVT (deep venous thrombosis) ([place])   . Hypertension   . Left leg paresthesias     Past Surgical History:  Procedure Laterality Date  . FASCIOTOMY    . left bka    . THROMBECTOMY      No Known Allergies  Outpatient Encounter Medications as of 01/27/2022  Medication Sig  . apixaban (ELIQUIS) 5 MG TABS tablet Take 1 tablet (5 mg total) by mouth 2 (two) times daily.  . blood glucose meter kit and supplies KIT Dispense based on patient and insurance preference.Test blood sugar 3 times a day DX: E11.49  . Buprenorphine HCl-Naloxone HCl 8-2 MG FILM Place under the tongue.  . carvedilol (COREG) 12.5 MG tablet Take 1 tablet (12.5 mg total) by mouth 2 (two) times daily with a meal.  . famotidine (PEPCID) 20 MG tablet TAKE 1 TABLET(20 MG) BY MOUTH DAILY  . metFORMIN (GLUCOPHAGE) 500 MG tablet Take 1 tablet (500 mg total) by mouth daily with breakfast.  . oxyCODONE (ROXICODONE) 5 MG immediate release tablet Take 1 tablet (5 mg total) by mouth every 6 (six) hours as needed for severe pain.  . pregabalin (LYRICA) 150 MG capsule Take 1 capsule (150 mg total) by mouth 3 (three) times daily.  . [DISCONTINUED] tiZANidine (ZANAFLEX) 4 MG  tablet Take 1 tablet (4 mg total) by mouth every 8 (eight) hours as needed for muscle spasms. (Patient not taking: Reported on 01/02/2022)   No facility-administered encounter medications on file as of 01/27/2022.    Review of Systems:  Review of Systems  Constitutional:  Negative for activity change, appetite change and fever.  HENT:  Negative for sore throat.   Eyes: Negative.   Cardiovascular:  Negative for chest pain and leg swelling.  Gastrointestinal:  Negative for abdominal distention, diarrhea and vomiting.  Genitourinary:  Negative for dysuria, frequency and urgency.  Skin:  Negative for color change.  Neurological:  Negative for dizziness and headaches.  Psychiatric/Behavioral:  Negative for behavioral problems and sleep disturbance. The patient is not nervous/anxious.     Health Maintenance  Topic Date Due  . COVID-19 Vaccine (1) Never done  . HIV Screening  Never done  . Diabetic kidney evaluation - Urine ACR  Never done  . Hepatitis C Screening  Never done  . Diabetic kidney evaluation - GFR measurement  07/05/2021  . INFLUENZA VACCINE  Never done  . TETANUS/TDAP  04/15/2025  . HPV VACCINES  Aged Out    Physical Exam: There were no vitals filed for this visit. There is no height or weight on  file to calculate BMI. Physical Exam Constitutional:      Appearance: Normal appearance.  HENT:     Head: Normocephalic and atraumatic.     Mouth/Throat:     Mouth: Mucous membranes are moist.  Eyes:     Conjunctiva/sclera: Conjunctivae normal.  Cardiovascular:     Rate and Rhythm: Normal rate and regular rhythm.     Pulses: Normal pulses.     Heart sounds: Normal heart sounds.  Pulmonary:     Effort: Pulmonary effort is normal.     Breath sounds: Normal breath sounds.  Abdominal:     General: Bowel sounds are normal.     Palpations: Abdomen is soft.  Musculoskeletal:        General: No swelling. Normal range of motion.     Cervical back: Normal range of motion.      Comments: Left BKA  Skin:    General: Skin is warm and dry.  Neurological:     General: No focal deficit present.     Mental Status: He is alert and oriented to person, place, and time.  Psychiatric:        Mood and Affect: Mood normal.        Behavior: Behavior normal.        Thought Content: Thought content normal.        Judgment: Judgment normal.    Labs reviewed: Basic Metabolic Panel: No results for input(s): "NA", "K", "CL", "CO2", "GLUCOSE", "BUN", "CREATININE", "CALCIUM", "MG", "PHOS", "TSH" in the last 8760 hours. Liver Function Tests: No results for input(s): "AST", "ALT", "ALKPHOS", "BILITOT", "PROT", "ALBUMIN" in the last 8760 hours. No results for input(s): "LIPASE", "AMYLASE" in the last 8760 hours. No results for input(s): "AMMONIA" in the last 8760 hours. CBC: No results for input(s): "WBC", "NEUTROABS", "HGB", "HCT", "MCV", "PLT" in the last 8760 hours. Lipid Panel: No results for input(s): "CHOL", "HDL", "LDLCALC", "TRIG", "CHOLHDL", "LDLDIRECT" in the last 8760 hours. No results found for: "HGBA1C"  Procedures since last visit: No results found.  Assessment/Plan    Labs/tests ordered:    Next appt:  03/21/2022

## 2022-01-27 NOTE — Patient Instructions (Signed)
Stump and Prosthesis Care When an arm or leg is removed, it is important to care for: The artificial body part that replaces the arm or leg (prosthesis). The remaining end of the arm or leg (stump). Caring for the stump and prosthesis will help you stay comfortable, active, and healthy. How to care for your stump Cleaning your skin Wash your stump with a mild antibacterial soap at least once each day. Wash your stump after getting dirty or sweaty. After washing your stump, pat it dry and let it air-dry for 5-10 minutes. Do not soak your stump in a warm or hot bath for longer than 20 minutes at a time. Avoid shaving hair on the stump. Hair that grows out after being shaved is more easily irritated by the prosthesis. Using skincare products Apply ointment to your surgical scar if your health care provider told you to do so. This can keep the scar soft and help it heal. Do not put creams and lotions on your stump unless told by your health care provider. If your health care provider says it is okay to put creams and lotions on your stump, do not use lotions that contain petroleum jelly. Do not use skincare products with an alcohol base. These products can be harmful to your skin. They can also damage the lining of the prosthesis. Consider using an antiperspirant spray on the skin of the stump if you tend to sweat. Other instructions Check the skin on your stump daily. To do this: Use a mirror with a long handle to check areas you cannot see, or ask a friend or family member to check those areas. Look for areas that appear reddish, swollen, or irritated. Pay extra attention to places where the stump and prosthesis rub together. Tell your health care provider if you have concerns. Wear the elastic wrap on your stump as told by your health care provider. The wrap may become loose or your stump may swell when you are active. Rewrap the bandage every couple of hours as needed. How to care for your  prosthesis Cleaning your prosthesis Use hot water and antibacterial soap to wash your prosthesis. Dry the prosthesis using a clean towel. Attaching your prosthesis Make sure all parts of your prosthesis that touch the skin are clean and dry before you attach it to your stump. Wear socks or wraps under the prosthesis. Attach your prosthesis to your stump. Be sure you understand how to attach it. A prosthetic specialist (prosthetist) can show you how to do this. It is a good idea to practice several times while he or she watches. Other instructions     Exercise and move your prosthesis as told by your physical therapist. Follow your health care provider's instructions about the length of time you should wear your prosthesis. You may be instructed to: Limit the amount of time you wear your prosthesis at first. Increase the time you wear your prosthesis a little each day. Where to find more information [employer]: www.amputee-coalition.org/ Contact a health care provider if: The prosthesis does not seem to fit correctly. You have an itchy rash, redness or a sore on your stump. There is a lot of sweat between the stump and the prosthesis, and efforts to control the sweating do not work. Your stump is painful to the touch, or it is red, swollen, or hot. A bad smell develops around the stump. There is a sore on your stump that is not healing. There is drainage coming from  your stump. Get help right away if: Your stump is colder than the upper part of your limb. Skin on your stump turns gray or black. Summary When an arm or leg is removed, it is important to care for the prosthesis and the stump. Wash your stump with a mild antibacterial soap at least once each day or if it gets dirty or sweaty. Then, pat it dry and let it air-dry for 5-10 minutes. Follow your health care provider's instructions on the use of skin care products on your stump. Every day, closely check the skin on  your stump. Look for areas that appear reddish, swollen, or irritated. Tell your health care provider if you have concerns. Make sure your prosthesis is clean before you attach it to your stump. Clean and dry all the parts that touch your skin. This information is not intended to replace advice given to you by your health care provider. Make sure you discuss any questions you have with your health care provider. Document Revised: 08/16/2020 Document Reviewed: 08/16/2020 Elsevier Patient Education  2023 Elsevier Inc.  

## 2022-01-28 ENCOUNTER — Ambulatory Visit: Admission: RE | Admit: 2022-01-28 | Payer: Medicare Other | Source: Ambulatory Visit

## 2022-01-29 NOTE — Progress Notes (Signed)
This encounter was created in error - please disregard.

## 2022-03-21 ENCOUNTER — Ambulatory Visit (INDEPENDENT_AMBULATORY_CARE_PROVIDER_SITE_OTHER): Payer: Medicare Other | Admitting: Adult Health

## 2022-03-21 ENCOUNTER — Encounter: Payer: Self-pay | Admitting: Adult Health

## 2022-03-21 VITALS — BP 138/86 | HR 74 | Temp 98.7°F | Resp 16 | Ht 65.0 in | Wt 176.0 lb

## 2022-03-21 DIAGNOSIS — I252 Old myocardial infarction: Secondary | ICD-10-CM | POA: Diagnosis not present

## 2022-03-21 DIAGNOSIS — M25552 Pain in left hip: Secondary | ICD-10-CM | POA: Diagnosis not present

## 2022-03-21 DIAGNOSIS — Z23 Encounter for immunization: Secondary | ICD-10-CM | POA: Diagnosis not present

## 2022-03-21 DIAGNOSIS — G8929 Other chronic pain: Secondary | ICD-10-CM

## 2022-03-21 DIAGNOSIS — E1149 Type 2 diabetes mellitus with other diabetic neurological complication: Secondary | ICD-10-CM | POA: Diagnosis not present

## 2022-03-21 DIAGNOSIS — M792 Neuralgia and neuritis, unspecified: Secondary | ICD-10-CM

## 2022-03-21 DIAGNOSIS — Z86718 Personal history of other venous thrombosis and embolism: Secondary | ICD-10-CM

## 2022-03-21 DIAGNOSIS — G894 Chronic pain syndrome: Secondary | ICD-10-CM

## 2022-03-21 DIAGNOSIS — Z89512 Acquired absence of left leg below knee: Secondary | ICD-10-CM

## 2022-03-21 DIAGNOSIS — I10 Essential (primary) hypertension: Secondary | ICD-10-CM

## 2022-03-21 DIAGNOSIS — K861 Other chronic pancreatitis: Secondary | ICD-10-CM

## 2022-03-21 MED ORDER — METFORMIN HCL 500 MG PO TABS: 500.0000 mg | ORAL_TABLET | Freq: Every day | ORAL | 3 refills | Status: DC

## 2022-03-21 NOTE — Progress Notes (Signed)
[facility] clinic  Provider:   Code Status: *** Goals of Care:     12/20/2021   10:18 AM  Advanced Directives  Does Patient Have a Medical Advance Directive? No  Would patient like information on creating a medical advance directive? No - Patient declined     Chief Complaint  Patient presents with  . Follow-up    Patient is here to left hip pain and would like an xray due to already having other hip replaced    HPI: Patient is a 41 y.o. male seen today for an acute visit for  Past Medical History:  Diagnosis Date  . Chronic back pain   . Chronic leg pain    left  . Chronic pain   . DVT (deep venous thrombosis) ([place])   . Hypertension   . Left leg paresthesias     Past Surgical History:  Procedure Laterality Date  . FASCIOTOMY    . left bka    . THROMBECTOMY      No Known Allergies  Outpatient Encounter Medications as of 03/21/2022  Medication Sig  . apixaban (ELIQUIS) 5 MG TABS tablet Take 1 tablet (5 mg total) by mouth 2 (two) times daily.  . blood glucose meter kit and supplies KIT Dispense based on patient and insurance preference.Test blood sugar 3 times a day DX: E11.49  . Buprenorphine HCl-Naloxone HCl 8-2 MG FILM Place under the tongue.  . carvedilol (COREG) 12.5 MG tablet Take 1 tablet (12.5 mg total) by mouth 2 (two) times daily with a meal.  . famotidine (PEPCID) 20 MG tablet TAKE 1 TABLET(20 MG) BY MOUTH DAILY  . metFORMIN (GLUCOPHAGE) 500 MG tablet Take 1 tablet (500 mg total) by mouth daily with breakfast.  . oxyCODONE (ROXICODONE) 5 MG immediate release tablet Take 1 tablet (5 mg total) by mouth every 6 (six) hours as needed for severe pain.  . pregabalin (LYRICA) 150 MG capsule Take 1 capsule (150 mg total) by mouth 3 (three) times daily.   No facility-administered encounter medications on file as of 03/21/2022.    Review of Systems:  Review of Systems  Constitutional:  Negative for activity change, appetite change and fever.  HENT:  Negative for  sore throat.   Eyes: Negative.   Cardiovascular:  Negative for chest pain and leg swelling.  Gastrointestinal:  Negative for abdominal distention, diarrhea and vomiting.  Genitourinary:  Negative for dysuria, frequency and urgency.  Skin:  Negative for color change.  Neurological:  Negative for dizziness and headaches.  Psychiatric/Behavioral:  Negative for behavioral problems and sleep disturbance. The patient is not nervous/anxious.     Health Maintenance  Topic Date Due  . Medicare Annual Wellness (AWV)  Never done  . COVID-19 Vaccine (1) Never done  . HIV Screening  Never done  . Diabetic kidney evaluation - Urine ACR  Never done  . Hepatitis C Screening  Never done  . Diabetic kidney evaluation - GFR measurement  07/05/2021  . INFLUENZA VACCINE  07/13/2022 (Originally 11/12/2021)  . DTaP/Tdap/Td (2 - Td or Tdap) 04/15/2025  . HPV VACCINES  Aged Out    Physical Exam: Vitals:   03/21/22 1119  BP: 138/86  Pulse: 74  Resp: 16  Temp: 98.7 F (37.1 C)  SpO2: 98%  Weight: 176 lb (79.8 kg)  Height: _0  (1.651 m)   Body mass index is 29.29 kg/m. Physical Exam Constitutional:      Appearance: Normal appearance.  HENT:     Head: Normocephalic  and atraumatic.     Mouth/Throat:     Mouth: Mucous membranes are moist.  Eyes:     Conjunctiva/sclera: Conjunctivae normal.  Cardiovascular:     Rate and Rhythm: Normal rate and regular rhythm.     Pulses: Normal pulses.     Heart sounds: Normal heart sounds.  Pulmonary:     Effort: Pulmonary effort is normal.     Breath sounds: Normal breath sounds.  Abdominal:     General: Bowel sounds are normal.     Palpations: Abdomen is soft.  Musculoskeletal:        General: No swelling. Normal range of motion.     Cervical back: Normal range of motion.     Comments: Left BKA with prosthesis  Skin:    General: Skin is warm and dry.  Neurological:     General: No focal deficit present.     Mental Status: He is alert and oriented  to person, place, and time.  Psychiatric:        Mood and Affect: Mood normal.        Behavior: Behavior normal.        Thought Content: Thought content normal.        Judgment: Judgment normal.    Labs reviewed: Basic Metabolic Panel: No results for input(s): "NA", "K", "CL", "CO2", "GLUCOSE", "BUN", "CREATININE", "CALCIUM", "MG", "PHOS", "TSH" in the last 8760 hours. Liver Function Tests: No results for input(s): "AST", "ALT", "ALKPHOS", "BILITOT", "PROT", "ALBUMIN" in the last 8760 hours. No results for input(s): "LIPASE", "AMYLASE" in the last 8760 hours. No results for input(s): "AMMONIA" in the last 8760 hours. CBC: No results for input(s): "WBC", "NEUTROABS", "HGB", "HCT", "MCV", "PLT" in the last 8760 hours. Lipid Panel: No results for input(s): "CHOL", "HDL", "LDLCALC", "TRIG", "CHOLHDL", "LDLDIRECT" in the last 8760 hours. No results found for: "HGBA1C"  Procedures since last visit: No results found.  Assessment/Plan     Labs/tests ordered:  * No order type specified * Next appt:  Visit date not found

## 2022-03-21 NOTE — Patient Instructions (Signed)

## 2022-04-08 ENCOUNTER — Ambulatory Visit: Payer: Medicare Other | Attending: Cardiology | Admitting: Cardiology

## 2022-04-08 ENCOUNTER — Encounter: Payer: Self-pay | Admitting: Cardiology

## 2022-04-08 VITALS — BP 108/86 | HR 66 | Ht 65.0 in | Wt 177.2 lb

## 2022-04-08 DIAGNOSIS — I10 Essential (primary) hypertension: Secondary | ICD-10-CM | POA: Diagnosis not present

## 2022-04-08 DIAGNOSIS — R072 Precordial pain: Secondary | ICD-10-CM | POA: Diagnosis not present

## 2022-04-08 MED ORDER — METOPROLOL TARTRATE 100 MG PO TABS: ORAL_TABLET | ORAL | 0 refills | Status: DC

## 2022-04-08 NOTE — Patient Instructions (Addendum)
Medication Instructions:  No changes *If you need a refill on your cardiac medications before your next appointment, please call your pharmacy*   Lab Work: Your provider would like for you to have following labs drawn: BMET.   Please go to the [facility] entrance and check in at the front desk.  You do not need an appointment.  They are open from 7am-6 pm.    If you have labs (blood work) drawn today and your tests are completely normal, you will receive your results only by: [place] (if you have MyChart) OR A paper copy in the mail If you have any lab test that is abnormal or we need to change your treatment, we will call you to review the results.   Testing/Procedures: Your physician has requested that you have an echocardiogram. Echocardiography is a painless test that uses sound waves to create images of your heart. It provides your doctor with information about the size and shape of your heart and how well your heart's chambers and valves are working.   You may receive an ultrasound enhancing agent through an IV if needed to better visualize your heart during the echo. This procedure takes approximately one hour.  There are no restrictions for this procedure.  This will take place at [facility] [id number], [place]    Follow-Up: At [facility], you and your health needs are our priority.  As part of our continuing mission to provide you with exceptional heart care, we have created designated Provider Care Teams.  These Care Teams include your primary Cardiologist (physician) and Advanced Practice Providers (APPs -  Physician Assistants and Nurse Practitioners) who all work together to provide you with the care you need, when you need it.  We recommend signing up for the patient portal called "MyChart".  Sign up information is provided on this After Visit Summary.  MyChart is used to connect with patients for Virtual  Visits (Telemedicine).  Patients are able to view lab/test results, encounter notes, upcoming appointments, etc.  Non-urgent messages can be sent to your provider as well.   To learn more about what you can do with MyChart, go to [email or web address].    Your next appointment:   Follow up with Dr. Garen Lah after testing Other Instructions   Your cardiac CT will be scheduled at one of the below locations:   [facility] [address] [place], [place] [address] [phone]  [place] [address] [place], [place] [address] [phone]  [facility] [place], [place] [address] [phone]  If scheduled at [facility], please arrive at the [facility] (Entrance C2) of [facility] 30 minutes prior to test start time. You can use the FREE valet parking offered at entrance C (encouraged to control the heart rate for the test)  Proceed to the [facility] Radiology Department (first floor) to check-in and test prep.  All radiology patients and guests should use entrance C2 at [facility], accessed from [facility], even though the hospital's physical address listed is [address]    If scheduled at [facility] or [facility], please arrive 15 mins early for check-in and test prep.   Please follow these instructions carefully (unless otherwise directed):  Hold all erectile dysfunction medications at least 3 days (  72 hrs) prior to test. (Ie viagra, cialis, sildenafil, tadalafil, etc) We will administer nitroglycerin during this exam.   On the Night Before the Test: Be sure to Drink plenty of water. Do not consume any caffeinated/decaffeinated beverages or chocolate 12 hours prior to your test. Do not take any antihistamines 12 hours prior to your  test.  On the Day of the Test: Drink plenty of water until 1 hour prior to the test. Do not eat any food 1 hour prior to test. You may take your regular medications prior to the test.  Take metoprolol (Lopressor) two hours prior to test.   After the Test: Drink plenty of water. After receiving IV contrast, you may experience a mild flushed feeling. This is normal. On occasion, you may experience a mild rash up to 24 hours after the test. This is not dangerous. If this occurs, you can take Benadryl 25 mg and increase your fluid intake. If you experience trouble breathing, this can be serious. If it is severe call 911 IMMEDIATELY. If it is mild, please call our office. If you take any of these medications: Glipizide/Metformin, Avandament, Glucavance, please do not take 48 hours after completing test unless otherwise instructed.  We will call to schedule your test 2-4 weeks out understanding that some insurance companies will need an authorization prior to the service being performed.   For non-scheduling related questions, please contact the cardiac imaging nurse navigator should you have any questions/concerns: Rockwell [place], Cardiac Imaging Nurse Navigator Larey Brick, Cardiac Imaging Nurse Navigator [facility] Direct Office Dial: [phone]   For scheduling needs, including cancellations and rescheduling, please call [place], [phone].

## 2022-04-08 NOTE — Progress Notes (Signed)
Cardiology Office Note:    Date:  04/08/2022   ID:  David Davies, DOB 10-08-1980, MRN [id number]  PCP:  Nickola Major, NP   [place] Prov[facility] Cardiologist:  Kate Sable, MD     Referring MD: Nickola Major*   Chief Complaint  Patient presents with   New Patient (Initial Visit)    NonStEMi, "chest ache", Cardiac Hx    History of Present Illness:    David Davies is a 41 y.o. 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  Chest pain has been ongoing for years now.  States having severe chest pain in 2017 while he was in [place], evaluated in the hospital and was told he has a mild heart attack.  Managed with medications.  Did not follow-up with cardiology since.  Has had occasional chest discomfort, last episode 6 months ago but did not go to the ED due to high ED volumes.  He has had occasional chest tightness last episode yesterday.    Past Medical History:  Diagnosis Date   Chronic back pain    Chronic leg pain    left   Chronic pain    DVT (deep venous thrombosis) (HCC)    Hypertension    Left leg paresthesias     Past Surgical History:  Procedure Laterality Date   FASCIOTOMY     left bka     THROMBECTOMY      Current Medications: Current Meds  Medication Sig   apixaban (ELIQUIS) 5 MG TABS tablet Take 1 tablet (5 mg total) by mouth 2 (two) times daily.   blood glucose meter kit and supplies KIT Dispense based on patient and insurance preference.Test blood sugar 3 times a day DX: E11.49   Buprenorphine HCl-Naloxone HCl 8-2 MG FILM Place under the tongue.   carvedilol (COREG) 12.5 MG tablet Take 1 tablet (12.5 mg total) by mouth 2 (two) times daily with a meal.   metFORMIN (GLUCOPHAGE) 500 MG tablet Take 1 tablet (500 mg total) by mouth daily with breakfast.   metoprolol tartrate (LOPRESSOR) 100 MG tablet  Take one tablet two hours prior to the test   pregabalin (LYRICA) 150 MG capsule Take 1 capsule (150 mg total) by mouth 3 (three) times daily.     Allergies:   Patient has no known allergies.   Social History   Socioeconomic History   Marital status: Single    Spouse name: Not on file   Number of children: Not on file   Years of education: Not on file   Highest education level: Not on file  Occupational History   Not on file  Tobacco Use   Smoking status: Never   Smokeless tobacco: Never  Vaping Use   Vaping Use: Never used  Substance and Sexual Activity   Alcohol use: No   Drug use: No   Sexual activity: Not on file  Other Topics Concern   Not on file  Social History Narrative   Not on file   Social Determinants of Health   Financial Resource Strain: Not on file  Food Insecurity: Not on file  Transportation Needs: Not on file  Physical Activity: Not on file  Stress: Not on file  Social Connections: Not on file     Family History: The patient's family history is not on file.  ROS:   Please see the history  of present illness.     All other systems reviewed and are negative.  EKGs/Labs/Other Studies Reviewed:    The following studies were reviewed today:   EKG:  EKG is  ordered today.  The ekg ordered today demonstrates normal sinus rhythm, normal ECG  Recent Labs: No results found for requested labs within last 365 days.  Recent Lipid Panel No results found for: "CHOL", "TRIG", "HDL", "CHOLHDL", "VLDL", "LDLCALC", "LDLDIRECT"   Risk Assessment/Calculations:             Physical Exam:    VS:  BP 108/86 (BP Location: Right Arm)   Pulse 66   Ht _0  (1.651 m)   Wt 177 lb 3.2 oz (80.4 kg)   SpO2 95%   BMI 29.49 kg/m     Wt Readings from Last 3 Encounters:  04/08/22 177 lb 3.2 oz (80.4 kg)  03/21/22 176 lb (79.8 kg)  01/27/22 182 lb (82.6 kg)     GEN:  Well nourished, well developed in no acute distress HEENT: Normal NECK: No JVD; No  carotid bruits CARDIAC: RRR, no murmurs, rubs, gallops RESPIRATORY:  Clear to auscultation without rales, wheezing or rhonchi  ABDOMEN: Soft, non-tender, non-distended MUSCULOSKELETAL:  No edema; left BKA noted SKIN: Warm and dry NEUROLOGIC:  Alert and oriented x 3 PSYCHIATRIC:  Normal affect   ASSESSMENT:    1. Precordial pain   2. Primary hypertension    PLAN:    In order of problems listed above:  Chest pain, self-reported NSTEMI.  Risk factors hypertension, former smoker.  Get echo, get coronary CTA. Hypertension, BP controlled, continue Coreg.  Follow-up after echo and coronary CTA.       Medication Adjustments/Labs and Tests Ordered: Current medicines are reviewed at length with the patient today.  Concerns regarding medicines are outlined above.  Orders Placed This Encounter  Procedures   CT CORONARY MORPH W/CTA COR W/SCORE W/CA W/CM &/OR WO/CM   Basic metabolic panel   EKG 78-EUMP   ECHOCARDIOGRAM COMPLETE   Meds ordered this encounter  Medications   metoprolol tartrate (LOPRESSOR) 100 MG tablet    Sig: Take one tablet two hours prior to the test    Dispense:  1 tablet    Refill:  0    Patient Instructions  Medication Instructions:  No changes *If you need a refill on your cardiac medications before your next appointment, please call your pharmacy*   Lab Work: Your provider would like for you to have following labs drawn: BMET.   Please go to the [facility] entrance and check in at the front desk.  You do not need an appointment.  They are open from 7am-6 pm.    If you have labs (blood work) drawn today and your tests are completely normal, you will receive your results only by: [place] (if you have MyChart) OR A paper copy in the mail If you have any lab test that is abnormal or we need to change your treatment, we will call you to review the results.   Testing/Procedures: Your physician has requested that you have an  echocardiogram. Echocardiography is a painless test that uses sound waves to create images of your heart. It provides your doctor with information about the size and shape of your heart and how well your heart's chambers and valves are working.   You may receive an ultrasound enhancing agent through an IV if needed to better visualize your heart during the echo. This procedure takes  approximately one hour.  There are no restrictions for this procedure.  This will take place at [facility] [id number], [place]    Follow-Up: At [facility], you and your health needs are our priority.  As part of our continuing mission to provide you with exceptional heart care, we have created designated Provider Care Teams.  These Care Teams include your primary Cardiologist (physician) and Advanced Practice Providers (APPs -  Physician Assistants and Nurse Practitioners) who all work together to provide you with the care you need, when you need it.  We recommend signing up for the patient portal called "MyChart".  Sign up information is provided on this After Visit Summary.  MyChart is used to connect with patients for Virtual Visits (Telemedicine).  Patients are able to view lab/test results, encounter notes, upcoming appointments, etc.  Non-urgent messages can be sent to your provider as well.   To learn more about what you can do with MyChart, go to [email or web address].    Your next appointment:   Follow up with Dr. Garen Lah after testing Other Instructions   Your cardiac CT will be scheduled at one of the below locations:   [facility] [address] [place], [place] [address] [phone]  [place] [address] [place], [place] [address] [phone]  [facility] [place], [place] [address] [phone]  If scheduled at [facility], please arrive at the [facility] and Children's Entrance (Entrance C2) of [facility] 30 minutes prior to test start time. You can use the FREE valet parking offered at entrance C (encouraged to control the heart rate for the test)  Proceed to the [facility] Radiology Department (first floor) to check-in and test prep.  All radiology patients and guests should use entrance C2 at [facility], accessed from [facility], even though the hospital's physical address listed is [address]    If scheduled at [facility] or [facility], please arrive 15 mins early for check-in and test prep.   Please follow these instructions carefully (unless otherwise directed):  Hold all erectile dysfunction medications at least 3 days (72 hrs) prior to test. (Ie viagra, cialis, sildenafil, tadalafil, etc) We will administer nitroglycerin during this exam.   On the Night Before the Test: Be sure to Drink plenty of water. Do not consume any caffeinated/decaffeinated beverages or chocolate 12 hours prior to your test. Do not take any antihistamines 12 hours prior to your test.  On the Day of the Test: Drink plenty of water until 1 hour prior to the test. Do not eat any food 1 hour prior to test. You may take your regular medications prior to the test.  Take metoprolol (Lopressor) two hours prior to test.   After the Test: Drink plenty of water. After receiving IV contrast, you may experience a mild flushed feeling. This is normal. On occasion, you may experience a mild rash up to 24 hours after the test. This is not dangerous. If this occurs, you can take Benadryl 25 mg and increase your fluid intake. If you experience trouble breathing, this can be serious. If it is severe call 911 IMMEDIATELY. If it is mild, please call our office. If you take any of these medications: Glipizide/Metformin, Avandament,  Glucavance, please do not take 48 hours after completing test unless  otherwise instructed.  We will call to schedule your test 2-4 weeks out understanding that some insurance companies will need an authorization prior to the service being performed.   For non-scheduling related questions, please contact the cardiac imaging nurse navigator should you have any questions/concerns: Marchia Bond, Cardiac Imaging Nurse Navigator Gordy Clement, Cardiac Imaging Nurse Navigator [facility] Direct Office Dial: [phone]   For scheduling needs, including cancellations and rescheduling, please call [place], [phone].       Signed, Kate Sable, MD  04/08/2022 11:25 AM    [place]

## 2022-04-09 ENCOUNTER — Ambulatory Visit: Payer: Medicare Other | Admitting: Orthopaedic Surgery

## 2022-04-21 ENCOUNTER — Ambulatory Visit: Payer: Medicare Other | Admitting: Adult Health

## 2022-04-24 ENCOUNTER — Ambulatory Visit (INDEPENDENT_AMBULATORY_CARE_PROVIDER_SITE_OTHER): Payer: 59 | Admitting: Adult Health

## 2022-04-24 ENCOUNTER — Encounter: Payer: Self-pay | Admitting: Adult Health

## 2022-04-24 VITALS — BP 152/102 | HR 78 | Temp 94.4°F | Ht 65.0 in | Wt 177.0 lb

## 2022-04-24 DIAGNOSIS — G894 Chronic pain syndrome: Secondary | ICD-10-CM

## 2022-04-24 DIAGNOSIS — Z86718 Personal history of other venous thrombosis and embolism: Secondary | ICD-10-CM

## 2022-04-24 DIAGNOSIS — E1149 Type 2 diabetes mellitus with other diabetic neurological complication: Secondary | ICD-10-CM

## 2022-04-24 DIAGNOSIS — L03818 Cellulitis of other sites: Secondary | ICD-10-CM | POA: Diagnosis not present

## 2022-04-24 DIAGNOSIS — I10 Essential (primary) hypertension: Secondary | ICD-10-CM

## 2022-04-24 MED ORDER — METFORMIN HCL 500 MG PO TABS: 500.0000 mg | ORAL_TABLET | Freq: Two times a day (BID) | ORAL | 3 refills | Status: DC

## 2022-04-24 MED ORDER — DOXYCYCLINE HYCLATE 100 MG PO TABS: 100.0000 mg | ORAL_TABLET | Freq: Two times a day (BID) | ORAL | 0 refills | Status: AC

## 2022-04-24 NOTE — Progress Notes (Signed)
[facility] clinic  Provider: Kenard Gower DNP  Code Status:  Full Code  Goals of Care:     12/20/2021   10:18 AM  Advanced Directives  Does Patient Have a Medical Advance Directive? No  Would patient like information on creating a medical advance directive? No - Patient declined     Chief Complaint  Patient presents with   Medical Management of Chronic Issues    Patient presents today for 1 month follow-up   Quality Metric Gaps    Urine microalbumin, HIV, AWV    HPI: Patient is a 42 y.o. male seen today for medical management of chronic issues.   He has a new left BKA prosthesis. He complained that it fits tight. He reported that he notified orthotic place about it and was told that it needs to be tight so it will mold into his BKA stump. He was noted to have a blister, approx 4 cm X 3 cm. He has been covering blister with Band-Aid which was noted to be wet with clear drainage.  He reported that his average CBG is in the 300s. Last hgbA1c 6.7, 12/11/21. He takes Metformin for diabetes mellitus.   He takes Eliquis for history of DVT on his RLE.   Past Medical History:  Diagnosis Date   Chronic back pain    Chronic leg pain    left   Chronic pain    Diabetes mellitus without complication (HCC)    DVT (deep venous thrombosis) (HCC)    Hypertension    Left leg paresthesias     Past Surgical History:  Procedure Laterality Date   FASCIOTOMY     left bka     THROMBECTOMY      No Known Allergies  Outpatient Encounter Medications as of 04/24/2022  Medication Sig   apixaban (ELIQUIS) 5 MG TABS tablet Take 1 tablet (5 mg total) by mouth 2 (two) times daily.   Buprenorphine HCl-Naloxone HCl 8-2 MG FILM Place under the tongue.   carvedilol (COREG) 12.5 MG tablet Take 1 tablet (12.5 mg total) by mouth 2 (two) times daily with a meal.   metFORMIN (GLUCOPHAGE) 500 MG tablet Take 1 tablet (500 mg total) by mouth daily with breakfast.   pregabalin (LYRICA) 150 MG capsule  Take 1 capsule (150 mg total) by mouth 3 (three) times daily.   blood glucose meter kit and supplies KIT Dispense based on patient and insurance preference.Test blood sugar 3 times a day DX: E11.49   [DISCONTINUED] famotidine (PEPCID) 20 MG tablet TAKE 1 TABLET(20 MG) BY MOUTH DAILY (Patient not taking: Reported on 04/08/2022)   [DISCONTINUED] metoprolol tartrate (LOPRESSOR) 100 MG tablet Take one tablet two hours prior to the test   [DISCONTINUED] oxyCODONE (ROXICODONE) 5 MG immediate release tablet Take 1 tablet (5 mg total) by mouth every 6 (six) hours as needed for severe pain. (Patient not taking: Reported on 04/08/2022)   No facility-administered encounter medications on file as of 04/24/2022.    Review of Systems:  Review of Systems  Constitutional:  Negative for activity change, appetite change and fever.  HENT:  Negative for sore throat.   Eyes: Negative.   Cardiovascular:  Negative for chest pain and leg swelling.  Gastrointestinal:  Negative for abdominal distention, diarrhea and vomiting.  Genitourinary:  Negative for dysuria, frequency and urgency.  Skin:  Positive for wound. Negative for color change.  Neurological:  Negative for dizziness and headaches.  Psychiatric/Behavioral:  Negative for behavioral problems and sleep disturbance. The  patient is not nervous/anxious.     Health Maintenance  Topic Date Due   Medicare Annual Wellness (AWV)  Never done   COVID-19 Vaccine (1) Never done   HIV Screening  Never done   Diabetic kidney evaluation - Urine ACR  Never done   Hepatitis C Screening  Never done   Diabetic kidney evaluation - eGFR measurement  07/05/2021   INFLUENZA VACCINE  07/13/2022 (Originally 11/12/2021)   DTaP/Tdap/Td (2 - Td or Tdap) 04/15/2025   HPV VACCINES  Aged Out    Physical Exam: Vitals:   04/24/22 1300  Weight: 177 lb (80.3 kg)  Height: 5\' 5"  (1.651 m)   Body mass index is 29.45 kg/m. Physical Exam Constitutional:      Appearance: He is  obese.  HENT:     Head: Normocephalic and atraumatic.     Mouth/Throat:     Mouth: Mucous membranes are moist.  Eyes:     Conjunctiva/sclera: Conjunctivae normal.  Cardiovascular:     Rate and Rhythm: Normal rate and regular rhythm.     Pulses: Normal pulses.     Heart sounds: Normal heart sounds.  Pulmonary:     Effort: Pulmonary effort is normal.     Breath sounds: Normal breath sounds.  Abdominal:     General: Bowel sounds are normal.     Palpations: Abdomen is soft.  Musculoskeletal:        General: No swelling. Normal range of motion.     Cervical back: Normal range of motion.     Comments: Old Left BKA  Skin:    General: Skin is warm and dry.     Comments: Left BKA blister  Neurological:     General: No focal deficit present.     Mental Status: He is alert and oriented to person, place, and time.  Psychiatric:        Mood and Affect: Mood normal.        Behavior: Behavior normal.        Thought Content: Thought content normal.        Judgment: Judgment normal.     Labs reviewed: Basic Metabolic Panel: No results for input(s): "NA", "K", "CL", "CO2", "GLUCOSE", "BUN", "CREATININE", "CALCIUM", "MG", "PHOS", "TSH" in the last 8760 hours. Liver Function Tests: No results for input(s): "AST", "ALT", "ALKPHOS", "BILITOT", "PROT", "ALBUMIN" in the last 8760 hours. No results for input(s): "LIPASE", "AMYLASE" in the last 8760 hours. No results for input(s): "AMMONIA" in the last 8760 hours. CBC: No results for input(s): "WBC", "NEUTROABS", "HGB", "HCT", "MCV", "PLT" in the last 8760 hours. Lipid Panel: No results for input(s): "CHOL", "HDL", "LDLCALC", "TRIG", "CHOLHDL", "LDLDIRECT" in the last 8760 hours. No results found for: "HGBA1C"  Procedures since last visit: No results found.  Assessment/Plan  1. Cellulitis of other specified site -  Cleanse left BKA blister with Ns and covered with dry dressing -   instructed patient to cleanse daily and cover -  doxycycline (VIBRA-TABS) 100 MG tablet; Take 1 tablet (100 mg total) by mouth 2 (two) times daily for 14 days.  Dispense: 28 tablet; Refill: 0 -  follow  2. Type 2 diabetes mellitus with other neurologic complication, without long-term current use of insulin (HCC) -   increase Metformin 500 mg daily to BID - Hemoglobin A1C; Future - metFORMIN (GLUCOPHAGE) 500 MG tablet; Take 1 tablet (500 mg total) by mouth 2 (two) times daily with a meal.  Dispense: 60 tablet; Refill: 3  3. History of  DVT (deep vein thrombosis) -  stable, continue Eliquis  4. Chronic pain syndrome -  follows up at [facility] in [place] -  continue Buprenorphine HCl-Naloxone and Lyrica   5. Primary hypertension -  BP152/102, elevated -  instructed to check BP and log daily, bring to next appointment    Labs/tests ordered:  hgbA1c  Next appt:  in 1 week

## 2022-04-24 NOTE — Patient Instructions (Signed)

## 2022-04-25 ENCOUNTER — Telehealth (HOSPITAL_COMMUNITY): Payer: Self-pay | Admitting: Emergency Medicine

## 2022-04-25 NOTE — Telephone Encounter (Signed)
Attempted to call patient regarding upcoming cardiac CT appointment. Left message on voicemail with name and callback number Marchia Bond RN Navigator Cardiac [facility] [phone] Office [phone] Cell

## 2022-04-28 ENCOUNTER — Ambulatory Visit: Admission: RE | Admit: 2022-04-28 | Payer: 59 | Source: Ambulatory Visit

## 2022-04-29 ENCOUNTER — Ambulatory Visit: Payer: Medicare Other | Admitting: Orthopaedic Surgery

## 2022-05-07 ENCOUNTER — Other Ambulatory Visit: Payer: 59

## 2022-05-07 ENCOUNTER — Encounter: Payer: 59 | Admitting: Family

## 2022-05-07 ENCOUNTER — Ambulatory Visit: Payer: Medicare Other | Admitting: Orthopaedic Surgery

## 2022-05-13 ENCOUNTER — Encounter: Payer: Medicare Other | Admitting: Student in an Organized Health Care Education/Training Program

## 2022-05-14 ENCOUNTER — Telehealth (HOSPITAL_COMMUNITY): Payer: Self-pay | Admitting: *Deleted

## 2022-05-14 NOTE — Telephone Encounter (Signed)
Attempted to call patient regarding upcoming cardiac CT appointment. Voicemail box full.  Dmarco Baldus RN Navigator Cardiac Imaging [place] [facility] [phone] Office [phone] Cell  

## 2022-05-15 ENCOUNTER — Ambulatory Visit: Admission: RE | Admit: 2022-05-15 | Payer: 59 | Source: Ambulatory Visit

## 2022-05-20 ENCOUNTER — Ambulatory Visit
Payer: Medicare HMO | Attending: Student in an Organized Health Care Education/Training Program | Admitting: Student in an Organized Health Care Education/Training Program

## 2022-05-20 ENCOUNTER — Encounter: Payer: Self-pay | Admitting: Student in an Organized Health Care Education/Training Program

## 2022-05-20 VITALS — BP 122/84 | HR 84 | Temp 98.1°F | Resp 16 | Ht 66.0 in | Wt 175.0 lb

## 2022-05-20 DIAGNOSIS — M792 Neuralgia and neuritis, unspecified: Secondary | ICD-10-CM | POA: Insufficient documentation

## 2022-05-20 DIAGNOSIS — G894 Chronic pain syndrome: Secondary | ICD-10-CM | POA: Insufficient documentation

## 2022-05-20 DIAGNOSIS — G546 Phantom limb syndrome with pain: Secondary | ICD-10-CM | POA: Diagnosis not present

## 2022-05-20 DIAGNOSIS — Z89512 Acquired absence of left leg below knee: Secondary | ICD-10-CM

## 2022-05-20 MED ORDER — ALPHA-LIPOIC ACID 600 MG PO CAPS: 600.0000 mg | ORAL_CAPSULE | Freq: Every day | ORAL | 5 refills | Status: AC

## 2022-05-20 MED ORDER — PREGABALIN 150 MG PO CAPS: 150.0000 mg | ORAL_CAPSULE | Freq: Three times a day (TID) | ORAL | 5 refills | Status: DC

## 2022-05-20 NOTE — Progress Notes (Signed)
PROVIDER NOTE: Information contained herein reflects review and annotations entered in association with encounter. Interpretation of such information and data should be left to medically-trained personnel. Information provided to patient can be located elsewhere in the medical record under "Patient Instructions". Document created using STT-dictation technology, any transcriptional errors that may result from process are unintentional.    Patient: David Davies  Service Category: E/M  Provider: Gillis Santa, MD  DOB: 04-05-1981  DOS: 05/20/2022  Specialty: Interventional Pain Management  MRN: [id number]  Setting: Ambulatory outpatient  PCP: Nickola Major, NP  Type: Established Patient    Referring Provider: [facility]  Location: Office  Delivery: Face-to-face     HPI  Mr. Limuel Nieblas, a 42 y.o. year old male, is here today because of his Neuropathic pain [M79.2]. Mr. Hosea primary complain today is Leg Pain (Neuropathy/Left leg below knee amputation, sometimes in the right ) Last encounter: My last encounter with him was on 11/14/21 Pertinent problems: Mr. Whedbee has Phantom limb pain ([place]); Status post below-knee amputation of left lower extremity ([place]); Neuropathic pain; and Chronic pain syndrome on their pertinent problem list. Pain Assessment: Severity of Chronic pain is reported as a 7 /10. Location: Leg Left, Right (left leg below the knee amputation)/pain shoots up the leg on the left. Onset: More than a month ago. Quality: Discomfort, Constant, Sharp, Other (Comment) (shock). Timing: Constant. Modifying factor(s): lyrica helps a little. Vitals:  height is 5\' 6"  (1.676 m) and weight is 175 lb (79.4 kg). His temporal temperature is 98.1 F (36.7 C). His blood pressure is 122/84 and his pulse is 84. His respiration is 16 and oxygen saturation is 99%.   Reason for encounter: medication management.   History of left below the knee amputation.  Currently on Suboxone  (managed by Suboxone clinic) for history of OUD.  Has severe neuropathic pain secondary to phantom limb pain.  Refill of Lyrica as below.  Recommend addition of alpha lipoic acid to help with neuropathic pain I have also offer the patient spinal cord stimulator trial for phantom limb pain which he would like to pursue.  I will need to obtain nerve conduction velocity/EMG study which I will refer the patient to neurology for as well as lumbar MRI.   ROS  Constitutional: Denies any fever or chills Gastrointestinal: No reported hemesis, hematochezia, vomiting, or acute GI distress Musculoskeletal: Denies any acute onset joint swelling, redness, loss of ROM, or weakness Neurological:  Left phantom limb pain  Medication Review  Alpha-Lipoic Acid, Buprenorphine HCl-Naloxone HCl, apixaban, blood glucose meter kit and supplies, carvedilol, metFORMIN, and pregabalin  History Review  Allergy: Mr. Sonneborn has No Known Allergies. Drug: Mr. Steinhauser  reports no history of drug use. Alcohol:  reports no history of alcohol use. Tobacco:  reports that he has never smoked. He has never used smokeless tobacco. Social: Mr. Crutchfield  reports that he has never smoked. He has never used smokeless tobacco. He reports that he does not drink alcohol and does not use drugs. Medical:  has a past medical history of Chronic back pain, Chronic leg pain, Chronic pain, Diabetes mellitus without complication ([place]), DVT (deep venous thrombosis) ([place]), Hypertension, and Left leg paresthesias. Surgical: Mr. Achord  has a past surgical history that includes Thrombectomy; Fasciotomy; and left bka. Family: family history is not on file.  Laboratory Chemistry Profile   Renal Lab Results  Component Value Date   BUN 11 07/05/2020   CREATININE 0.79 07/05/2020   GFR 133.59 08/22/2014  GFRAA >60 12/03/2019   GFRNONAA >60 07/05/2020    Hepatic Lab Results  Component Value Date   AST 59 (H) 07/05/2020   ALT 95 (H) 07/05/2020    ALBUMIN 4.0 07/05/2020   ALKPHOS 71 07/05/2020   LIPASE 54 (H) 07/05/2020    Electrolytes Lab Results  Component Value Date   NA 140 07/05/2020   K 3.9 07/05/2020   CL 104 07/05/2020   CALCIUM 9.4 07/05/2020    Bone No results found for: "VD25OH", "VD125OH2TOT", "QI6962XB2", "WU1324MW1", "25OHVITD1", "25OHVITD2", "02VOZDGU4", "TESTOFREE", "TESTOSTERONE"  Inflammation (CRP: Acute Phase) (ESR: Chronic Phase) Lab Results  Component Value Date   LATICACIDVEN 1.4 12/15/2014         Note: Above Lab results reviewed.  Recent Imaging Review  DG Chest 2 View CLINICAL DATA:  Chest pain, hypertension  EXAM: CHEST - 2 VIEW  COMPARISON:  06/05/2019  FINDINGS: The heart size and mediastinal contours are within normal limits. Both lungs are clear. The visualized skeletal structures are unremarkable.  IMPRESSION: No active cardiopulmonary disease.  Electronically Signed   By: Fidela Salisbury MD   On: 12/03/2019 01:52 Note: Reviewed        Physical Exam  General appearance: Well nourished, well developed, and well hydrated. In no apparent acute distress Mental status: Alert, oriented x 3 (person, place, & time)       Respiratory: No evidence of acute respiratory distress Eyes: PERLA Vitals: BP 122/84 (BP Location: Right Arm, Patient Position: Sitting, Cuff Size: Normal) Comment: values reported to MD  Pulse 84   Temp 98.1 F (36.7 C) (Temporal)   Resp 16   Ht 5\' 6"  (1.676 m)   Wt 175 lb (79.4 kg)   SpO2 99%   BMI 28.25 kg/m  BMI: Estimated body mass index is 28.25 kg/m as calculated from the following:   Height as of this encounter: 5\' 6"  (1.676 m).   Weight as of this encounter: 175 lb (79.4 kg). Ideal: Ideal body weight: 63.8 kg (140 lb 10.5 oz) Adjusted ideal body weight: 70 kg (154 lb 6.3 oz)   Left below the knee amputation  Assessment   Status Diagnosis  Persistent Persistent Persistent 1. Neuropathic pain   2. Phantom limb pain ([place])   3. Status  post below-knee amputation of left lower extremity ([place])   4. Chronic pain syndrome        Plan of Care    Mr. Kylian Loh has a current medication list which includes the following long-term medication(s): apixaban, carvedilol, metformin, and pregabalin.  Pharmacotherapy (Medications Ordered): Meds ordered this encounter  Medications   pregabalin (LYRICA) 150 MG capsule    Sig: Take 1 capsule (150 mg total) by mouth 3 (three) times daily.    Dispense:  90 capsule    Refill:  5   Alpha-Lipoic Acid 600 MG CAPS    Sig: Take 1 capsule (600 mg total) by mouth daily.    Dispense:  30 capsule    Refill:  5    Do not place medication on "Automatic Refill". Fill one day early if pharmacy is closed on scheduled refill date.   Orders Placed This Encounter  Procedures   MR LUMBAR SPINE WO CONTRAST    Patient presents with axial pain with possible radicular component. Please assist [place] in identifying specific level(s) and laterality of any additional findings such as: 1. Facet (Zygapophyseal) joint DJD (Hypertrophy, space narrowing, subchondral sclerosis, and/or osteophyte formation) 2. DDD and/or IVDD (Loss of disc height, desiccation,  gas patterns, osteophytes, endplate sclerosis, or "Black disc disease") 3. Pars defects 4. Spondylolisthesis, spondylosis, and/or spondyloarthropathies (include Degree/Grade of displacement in mm) (stability) 5. Vertebral body Fractures (acute/chronic) (state percentage of collapse) 6. Demineralization (osteopenia/osteoporotic) 7. Bone pathology 8. Foraminal narrowing  9. Surgical changes 10. Central, Lateral Recess, and/or Foraminal Stenosis (include AP diameter of stenosis in mm) 11. Surgical changes (hardware type, status, and presence of fibrosis) 12. Modic Type Changes (MRI only) 13. IVDD (Disc bulge, protrusion, herniation, extrusion) (Level, laterality, extent)    Standing Status:   Future    Standing Expiration Date:   06/18/2022    Scheduling  Instructions:     Please make sure that the patient understands that this needs to be done as soon as possible. Never have the patient do the imaging "just before the next appointment". Inform patient that having the imaging done within the [facility] will expedite the availability of the results and will provide      imaging availability to the requesting physician. In addition inform the patient that the imaging order has an expiration date and will not be renewed if not done within the active period.    Order Specific Question:   What is the patient's sedation requirement?    Answer:   No Sedation    Order Specific Question:   Does the patient have a pacemaker or implanted devices?    Answer:   No    Order Specific Question:   Preferred imaging location?    Answer:   [facility] (table limit-350lbs)    Order Specific Question:   Call Results- Best Contact Number?    Answer:   [phone] ([facility])    Order Specific Question:   Radiology Contrast Protocol - do NOT remove file path    Answer:   [email or web address]   Ambulatory referral to Neurology    Referral Priority:   Routine    Referral Type:   Consultation    Referral Reason:   Specialty Services Required    Referred to Provider:   Vladimir Crofts, MD    Requested Specialty:   Neurology    Number of Visits Requested:   1     Consider spinal cord stimulation in future pending lumbar MRI and nerve conduction velocity/EMG study  Follow-up plan:   Return in about 6 months (around 11/18/2022) for Medication Management, in person.    Recent Visits No visits were found meeting these conditions. Showing recent visits within past 90 days and meeting all other requirements Today's Visits Date Type Provider Dept  05/20/22 Office Visit Gillis Santa, MD [facility]  Showing today's visits and meeting all other requirements Future Appointments No visits were found meeting these  conditions. Showing future appointments within next 90 days and meeting all other requirements  I discussed the assessment and treatment plan with the patient. The patient was provided an opportunity to ask questions and all were answered. The patient agreed with the plan and demonstrated an understanding of the instructions.  Patient advised to call back or seek an in-person evaluation if the symptoms or condition worsens.  Duration of encounter: 56minutes.  Note by: Gillis Santa, MD Date: 05/20/2022; Time: 3:07 PM

## 2022-05-20 NOTE — Progress Notes (Signed)
Safety precautions to be maintained throughout the outpatient stay will include: orient to surroundings, keep bed in low position, maintain call bell within reach at all times, provide assistance with transfer out of bed and ambulation.  

## 2022-05-27 ENCOUNTER — Ambulatory Visit: Admission: RE | Admit: 2022-05-27 | Payer: Medicare HMO | Source: Ambulatory Visit

## 2022-05-30 ENCOUNTER — Other Ambulatory Visit: Payer: Medicare Other

## 2022-05-30 ENCOUNTER — Telehealth: Payer: Self-pay

## 2022-05-30 NOTE — Telephone Encounter (Signed)
Confirmed with PCP papers were faxed and sent to scan into patient chart. Also called spouse back and notified her as well.

## 2022-05-30 NOTE — Telephone Encounter (Signed)
Patient spouse called and states that she faxed papers over for patient yesterday. She states that someone she talked to confirmed that fax was received. I can't find any documentation of this encounter. Monina have you seen this fax?

## 2022-06-03 ENCOUNTER — Ambulatory Visit: Payer: Medicare Other | Admitting: Cardiology

## 2022-06-03 DIAGNOSIS — F1120 Opioid dependence, uncomplicated: Secondary | ICD-10-CM | POA: Diagnosis not present

## 2022-06-17 ENCOUNTER — Telehealth (HOSPITAL_COMMUNITY): Payer: Self-pay | Admitting: Emergency Medicine

## 2022-06-17 NOTE — Telephone Encounter (Signed)
Attempted to call patient regarding upcoming cardiac CT appointment. °Left message on voicemail with name and callback number °Tyra Michelle RN Navigator Cardiac Imaging °[facility] °[phone] Office °[phone] Cell ° °

## 2022-06-19 ENCOUNTER — Ambulatory Visit: Admission: RE | Admit: 2022-06-19 | Payer: Medicare HMO | Source: Ambulatory Visit

## 2022-06-30 DIAGNOSIS — F1120 Opioid dependence, uncomplicated: Secondary | ICD-10-CM | POA: Diagnosis not present

## 2022-07-09 ENCOUNTER — Other Ambulatory Visit: Payer: Self-pay | Admitting: Adult Health

## 2022-07-09 DIAGNOSIS — E1149 Type 2 diabetes mellitus with other diabetic neurological complication: Secondary | ICD-10-CM

## 2022-07-14 ENCOUNTER — Ambulatory Visit: Payer: Medicare HMO | Attending: Cardiology

## 2022-07-17 ENCOUNTER — Ambulatory Visit: Admission: RE | Admit: 2022-07-17 | Payer: Medicare HMO | Source: Ambulatory Visit

## 2022-07-24 ENCOUNTER — Encounter: Payer: Self-pay | Admitting: Cardiology

## 2022-07-24 ENCOUNTER — Ambulatory Visit: Payer: Medicare HMO | Attending: Cardiology | Admitting: Cardiology

## 2022-08-06 ENCOUNTER — Other Ambulatory Visit: Payer: Self-pay | Admitting: Adult Health

## 2022-08-06 DIAGNOSIS — E1149 Type 2 diabetes mellitus with other diabetic neurological complication: Secondary | ICD-10-CM

## 2022-09-25 ENCOUNTER — Other Ambulatory Visit: Payer: Self-pay

## 2022-09-25 DIAGNOSIS — Z86718 Personal history of other venous thrombosis and embolism: Secondary | ICD-10-CM

## 2022-09-25 DIAGNOSIS — I10 Essential (primary) hypertension: Secondary | ICD-10-CM

## 2022-09-25 MED ORDER — CARVEDILOL 12.5 MG PO TABS: 12.5000 mg | ORAL_TABLET | Freq: Two times a day (BID) | ORAL | 1 refills | Status: DC

## 2022-09-25 MED ORDER — APIXABAN 5 MG PO TABS: 5.0000 mg | ORAL_TABLET | Freq: Two times a day (BID) | ORAL | 1 refills | Status: DC

## 2022-09-29 ENCOUNTER — Other Ambulatory Visit: Payer: Self-pay

## 2022-09-29 DIAGNOSIS — Z86718 Personal history of other venous thrombosis and embolism: Secondary | ICD-10-CM

## 2022-09-29 MED ORDER — APIXABAN 5 MG PO TABS: 5.0000 mg | ORAL_TABLET | Freq: Two times a day (BID) | ORAL | 1 refills | Status: DC

## 2022-10-01 ENCOUNTER — Other Ambulatory Visit: Payer: Self-pay

## 2022-10-01 DIAGNOSIS — E1149 Type 2 diabetes mellitus with other diabetic neurological complication: Secondary | ICD-10-CM

## 2022-10-01 DIAGNOSIS — I10 Essential (primary) hypertension: Secondary | ICD-10-CM

## 2022-10-01 DIAGNOSIS — Z86718 Personal history of other venous thrombosis and embolism: Secondary | ICD-10-CM

## 2022-10-01 MED ORDER — APIXABAN 5 MG PO TABS
5.0000 mg | ORAL_TABLET | Freq: Two times a day (BID) | ORAL | 1 refills | Status: DC
Start: 2022-10-01 — End: 2023-02-13

## 2022-10-01 MED ORDER — METFORMIN HCL 500 MG PO TABS
500.0000 mg | ORAL_TABLET | Freq: Two times a day (BID) | ORAL | 1 refills | Status: DC
Start: 2022-10-01 — End: 2023-02-23

## 2022-10-01 MED ORDER — APIXABAN 5 MG PO TABS: 5.0000 mg | ORAL_TABLET | Freq: Two times a day (BID) | ORAL | 1 refills | Status: DC

## 2022-10-01 MED ORDER — CARVEDILOL 12.5 MG PO TABS
12.5000 mg | ORAL_TABLET | Freq: Two times a day (BID) | ORAL | 1 refills | Status: DC
Start: 2022-10-01 — End: 2022-10-01

## 2022-10-01 MED ORDER — CARVEDILOL 12.5 MG PO TABS
12.5000 mg | ORAL_TABLET | Freq: Two times a day (BID) | ORAL | 1 refills | Status: DC
Start: 2022-10-01 — End: 2022-10-07

## 2022-10-01 NOTE — Telephone Encounter (Signed)
Patients wife called, prior to lunch, about 12:50 pm stating she has called for 2 weeks with no reply, regarding getting a refill on Carvedilol and Eliquis. I informed Mrs.Tagliaferro that rx's were sent to [facility] in [place]. Mrs.Huffine states that was an error as they do not live in [place] and the rx's were to go to [facility] in [place]. I sent rx's to the [facility] in [place] and as soon as I did Mrs.Takacs stated she'd prefer for them to go to the mail order pharmacy.   After returning from lunch I called [facility] in [place] and canceled rx's and have now submitted them to the mail order pharmacy

## 2022-10-07 ENCOUNTER — Other Ambulatory Visit: Payer: Self-pay | Admitting: Adult Health

## 2022-10-07 DIAGNOSIS — I10 Essential (primary) hypertension: Secondary | ICD-10-CM

## 2022-10-13 ENCOUNTER — Telehealth: Payer: Self-pay | Admitting: Adult Health

## 2022-10-13 NOTE — Telephone Encounter (Signed)
Faxed last office visit note from 04/24/22 to [facility] [phone]), per their request. They needed last note in order to fill prescription from [facility] for new prosthetic.

## 2022-10-23 ENCOUNTER — Telehealth: Payer: 59 | Admitting: Adult Health

## 2022-11-10 ENCOUNTER — Encounter: Payer: 59 | Admitting: Adult Health

## 2022-11-10 ENCOUNTER — Encounter: Payer: Self-pay | Admitting: Adult Health

## 2022-11-11 ENCOUNTER — Encounter: Payer: 59 | Admitting: Student in an Organized Health Care Education/Training Program

## 2022-11-11 NOTE — Progress Notes (Signed)
This encounter was created in error - please disregard.

## 2022-12-05 ENCOUNTER — Ambulatory Visit: Payer: 59 | Admitting: Family

## 2022-12-05 ENCOUNTER — Encounter: Payer: Self-pay | Admitting: Family

## 2022-12-05 VITALS — BP 118/88 | HR 60 | Temp 97.4°F | Resp 18 | Ht 66.0 in | Wt 171.6 lb

## 2022-12-05 DIAGNOSIS — Z89512 Acquired absence of left leg below knee: Secondary | ICD-10-CM

## 2022-12-05 DIAGNOSIS — Z0289 Encounter for other administrative examinations: Secondary | ICD-10-CM

## 2022-12-05 DIAGNOSIS — E1149 Type 2 diabetes mellitus with other diabetic neurological complication: Secondary | ICD-10-CM | POA: Diagnosis not present

## 2022-12-05 NOTE — Progress Notes (Signed)
Provider: Carilyn Goodpasture Rene Gonsoulin FNP-C  Medina-Vargas, Margit Banda, NP  Patient Care Team: Gillis Santa, NP as PCP - General (Internal Medicine) Debbe Odea, MD as PCP - Cardiology (Cardiology)  Extended Emergency Contact Information Primary Emergency Contact: Meath,Pokita Mobile Phone: [phone] Relation: Spouse Secondary Emergency Contact: Doenges,Tracy Mobile Phone: [phone] Relation: Friend  Code Status:  Full Code  Goals of care: Advanced Directive information    11/10/2022   11:17 AM  Advanced Directives  Does Patient Have a Medical Advance Directive? No  Would patient like information on creating a medical advance directive? No - Patient declined     Chief Complaint  Patient presents with   Medication Management    needs perscription for prosthetic and needs to see doctor first    HPI:  Pt is a 42 y.o. male seen today for an acute visit for form completion for prosthesis.States would like forms completed for new sleeve for his left Prosthesis.He is highly motivated to walk with a prosthesis.He does wear prosthesis whole day. He states BKA has lost  volume causing prosthesis socket  not to fit properly. States no recent open wound.His liners needed for successful suspension are also worn beyond repair. He requires a new prosthetic socket ,liners,sleeves that will fit his current limb shape.The new socket,liners,sleeves will allow him to continue his ADL's without skin breakdown or falls.   Uses Rolling walker when going to the bathroom.He denies any fall episode.  Also request Placard form to be filled.    Past Medical History:  Diagnosis Date   Chronic back pain    Chronic leg pain    left   Chronic pain    Diabetes mellitus without complication (HCC)    DVT (deep venous thrombosis) (HCC)    Hypertension    Left leg paresthesias    Past Surgical History:  Procedure Laterality Date   FASCIOTOMY     left bka     THROMBECTOMY      No  Known Allergies  Outpatient Encounter Medications as of 12/05/2022  Medication Sig   apixaban (ELIQUIS) 5 MG TABS tablet Take 1 tablet (5 mg total) by mouth 2 (two) times daily.   blood glucose meter kit and supplies KIT Dispense based on patient and insurance preference.Test blood sugar 3 times a day DX: E11.49   Buprenorphine HCl-Naloxone HCl 8-2 MG FILM Place under the tongue.   carvedilol (COREG) 12.5 MG tablet Take 1 tablet (12.5 mg total) by mouth 2 (two) times daily with a meal. APPOINTMENT OVERDUE   metFORMIN (GLUCOPHAGE) 500 MG tablet Take 1 tablet (500 mg total) by mouth 2 (two) times daily with a meal.   pregabalin (LYRICA) 150 MG capsule Take 1 capsule (150 mg total) by mouth 3 (three) times daily.   No facility-administered encounter medications on file as of 12/05/2022.    Review of Systems  Constitutional:  Negative for appetite change, chills, fatigue, fever and unexpected weight change.  HENT:  Negative for congestion, dental problem, ear discharge, ear pain, facial swelling, hearing loss, nosebleeds, postnasal drip, rhinorrhea, sinus pressure, sinus pain, sneezing, sore throat, tinnitus and trouble swallowing.   Eyes:  Negative for pain, discharge, redness, itching and visual disturbance.  Respiratory:  Negative for cough, chest tightness, shortness of breath and wheezing.   Cardiovascular:  Negative for chest pain, palpitations and leg swelling.  Gastrointestinal:  Negative for abdominal distention, abdominal pain, blood in stool, constipation, diarrhea, nausea and vomiting.  Genitourinary:  Negative for difficulty urinating, dysuria, flank  pain, frequency and urgency.  Musculoskeletal:  Positive for gait problem. Negative for arthralgias, back pain, joint swelling, myalgias, neck pain and neck stiffness.       Left below knee amputee   Skin:  Negative for color change, pallor, rash and wound.  Neurological:  Negative for dizziness, syncope, speech difficulty, weakness,  light-headedness, numbness and headaches.  Hematological:  Does not bruise/bleed easily.    Immunization History  Administered Date(s) Administered   Tdap 04/16/2015   Pertinent  Health Maintenance Due  Topic Date Due   INFLUENZA VACCINE  11/13/2022      07/05/2020   10:53 AM 05/23/2021    8:29 AM 12/20/2021   10:18 AM 05/20/2022    1:39 PM 12/05/2022    2:27 PM  Fall Risk  Falls in the past year?  0 0 0 0  Was there an injury with Fall?  0 0  0  Fall Risk Category Calculator  0 0  0  Fall Risk Category (Retired)  Low Low    (RETIRED) Patient Fall Risk Level Low fall risk Low fall risk Low fall risk    Patient at Risk for Falls Due to   No Fall Risks Impaired balance/gait No Fall Risks  Fall risk Follow up   Falls evaluation completed Falls evaluation completed;Education provided Falls evaluation completed   Functional Status Survey:    Vitals:   12/05/22 1404  BP: 118/88  Pulse: 60  Resp: 18  Temp: (!) 97.4 F (36.3 C)  SpO2: 98%  Weight: 171 lb 9.6 oz (77.8 kg)  Height: 5\' 6"  (1.676 m)   Body mass index is 27.7 kg/m. Physical Exam Vitals reviewed.  Constitutional:      General: He is not in acute distress.    Appearance: Normal appearance. He is overweight. He is not ill-appearing or diaphoretic.  HENT:     Head: Normocephalic.     Mouth/Throat:     Mouth: Mucous membranes are moist.     Pharynx: Oropharynx is clear. No oropharyngeal exudate or posterior oropharyngeal erythema.  Eyes:     General: No scleral icterus.       Right eye: No discharge.        Left eye: No discharge.     Conjunctiva/sclera: Conjunctivae normal.     Pupils: Pupils are equal, round, and reactive to light.  Cardiovascular:     Rate and Rhythm: Normal rate and regular rhythm.     Heart sounds: Normal heart sounds. No murmur heard.    No friction rub. No gallop.     Comments: Left BKA  Pulmonary:     Effort: Pulmonary effort is normal. No respiratory distress.     Breath sounds:  Normal breath sounds. No wheezing, rhonchi or rales.  Chest:     Chest wall: No tenderness.  Abdominal:     General: Bowel sounds are normal. There is no distension.     Palpations: Abdomen is soft. There is no mass.     Tenderness: There is no abdominal tenderness. There is no right CVA tenderness, left CVA tenderness, guarding or rebound.  Musculoskeletal:        General: No swelling or tenderness. Normal range of motion.     Right lower leg: No edema.     Left Lower Extremity: Left leg is amputated below knee.  Skin:    General: Skin is warm and dry.     Coloration: Skin is not pale.     Findings: No bruising,  erythema, lesion or rash.  Neurological:     Mental Status: He is alert and oriented to person, place, and time.     Cranial Nerves: No cranial nerve deficit.     Sensory: No sensory deficit.     Motor: No weakness.     Coordination: Coordination normal.     Gait: Gait abnormal.  Psychiatric:        Mood and Affect: Mood normal.        Speech: Speech normal.        Behavior: Behavior normal.     Labs reviewed: No results for input(s): "NA", "K", "CL", "CO2", "GLUCOSE", "BUN", "CREATININE", "CALCIUM", "MG", "PHOS" in the last 8760 hours. No results for input(s): "AST", "ALT", "ALKPHOS", "BILITOT", "PROT", "ALBUMIN" in the last 8760 hours. No results for input(s): "WBC", "NEUTROABS", "HGB", "HCT", "MCV", "PLT" in the last 8760 hours. Lab Results  Component Value Date   TSH 2.09 08/28/2014   No results found for: "HGBA1C" No results found for: "CHOL", "HDL", "LDLCALC", "LDLDIRECT", "TRIG", "CHOLHDL"  Significant Diagnostic Results in last 30 days:  No results found.  Assessment/Plan 1. Type 2 diabetes mellitus with other neurologic complication, without long-term current use of insulin (HCC) Previous A1C 6.7  - continue on metfromin  - dietary modification advised   2. Encounter for completion of form with patient DMV placard form filled and completed during  visit.Patient has left BKA wears Prosthesis.   3. Status post below-knee amputation of left lower extremity (HCC) He requires a new prosthetic socket ,liners,sleeves that will fit his current limb shape.The new socket,liners,sleeves will allow him to continue his Adls without skin breakdown or falls.   Family/ staff Communication: Reviewed plan of care with patient verbalized understanding   Labs/tests ordered: None   Next Appointment: Return if symptoms worsen or fail to improve.   Caesar Bookman, NP

## 2022-12-06 ENCOUNTER — Other Ambulatory Visit: Payer: Self-pay | Admitting: Adult Health

## 2022-12-06 DIAGNOSIS — E1149 Type 2 diabetes mellitus with other diabetic neurological complication: Secondary | ICD-10-CM

## 2022-12-09 ENCOUNTER — Encounter: Payer: 59 | Admitting: Student in an Organized Health Care Education/Training Program

## 2022-12-11 ENCOUNTER — Encounter: Payer: 59 | Admitting: Student in an Organized Health Care Education/Training Program

## 2022-12-16 ENCOUNTER — Encounter: Payer: 59 | Admitting: Student in an Organized Health Care Education/Training Program

## 2022-12-25 ENCOUNTER — Ambulatory Visit
Payer: 59 | Attending: Student in an Organized Health Care Education/Training Program | Admitting: Student in an Organized Health Care Education/Training Program

## 2022-12-25 ENCOUNTER — Encounter: Payer: Self-pay | Admitting: Student in an Organized Health Care Education/Training Program

## 2022-12-25 DIAGNOSIS — Z89512 Acquired absence of left leg below knee: Secondary | ICD-10-CM

## 2022-12-25 DIAGNOSIS — G546 Phantom limb syndrome with pain: Secondary | ICD-10-CM

## 2022-12-25 DIAGNOSIS — G894 Chronic pain syndrome: Secondary | ICD-10-CM

## 2022-12-25 DIAGNOSIS — M792 Neuralgia and neuritis, unspecified: Secondary | ICD-10-CM

## 2022-12-25 MED ORDER — PREGABALIN 150 MG PO CAPS: 150.0000 mg | ORAL_CAPSULE | Freq: Three times a day (TID) | ORAL | 5 refills | Status: DC

## 2022-12-25 NOTE — Progress Notes (Signed)
Patient: David Davies  Service Category: E/M  Provider: Edward Jolly, MD  DOB: 1980/10/25  DOS: 12/25/2022  Location: Office  MRN: [id number]  Setting: Ambulatory outpatient  Referring Provider: Gillis Santa*  Type: Established Patient  Specialty: Interventional Pain Management  PCP: Gillis Santa, NP  Location: Remote location  Delivery: TeleHealth     Virtual Encounter - Pain Management PROVIDER NOTE: Information contained herein reflects review and annotations entered in association with encounter. Interpretation of such information and data should be left to medically-trained personnel. Information provided to patient can be located elsewhere in the medical record under "Patient Instructions". Document created using STT-dictation technology, any transcriptional errors that may result from process are unintentional.    Contact & Pharmacy Preferred: [phone] Home: [phone] (home) Mobile: [phone] (mobile) E-mail: [id number] [place], [place] - [address] AT [facility] [place] [address] [place] [address] Phone: [phone] Fax: [phone]  [facility] Delivery - [place], [place] - [address] [place] [address] Phone: [phone] Fax: [phone]   Pre-screening  Mr. Brossman offered "in-person" vs "virtual" encounter. He indicated preferring virtual for this encounter.   Reason COVID-19*  Social distancing based on CDC and AMA recommendations.   I contacted Sahaj Raj on 12/25/2022 via telephone.      I clearly identified myself as Edward Jolly, MD. I verified that I was speaking with the correct person using two identifiers (Name: Aviv Flannelly, and date of birth: 1980/05/01).  Consent I sought verbal advanced consent from [facility] for virtual visit interactions. I informed Mr. Venier of possible security and privacy  concerns, risks, and limitations associated with providing "not-in-person" medical evaluation and management services. I also informed Mr. Pent of the availability of "in-person" appointments. Finally, I informed him that there would be a charge for the virtual visit and that he could be  personally, fully or partially, financially responsible for it. Mr. Bloxsom expressed understanding and agreed to proceed.   Historic Elements   Mr. Imaan Ayson is a 42 y.o. year old, male patient evaluated today after our last contact on 05/20/22. Mr. Armentrout  has a past medical history of Chronic back pain, Chronic leg pain, Chronic pain, Diabetes mellitus without complication (HCC), DVT (deep venous thrombosis) (HCC), Hypertension, and Left leg paresthesias. He also  has a past surgical history that includes Thrombectomy; Fasciotomy; and left bka. Mr. Eisenberg has a current medication list which includes the following prescription(s): apixaban, blood glucose meter kit and supplies, buprenorphine hcl-naloxone hcl, carvedilol, metformin, and pregabalin. He  reports that he has never smoked. He has never used smokeless tobacco. He reports that he does not drink alcohol and does not use drugs. Mr. Mcfaul has No Known Allergies.   HPI  Today, he is being contacted for medication management.  No change in medical history since last visit.  Patient's pain is at baseline.  Patient continues multimodal pain regimen as prescribed.  States that it provides pain relief and improvement in functional status.  Currently on Suboxone (managed by Suboxone clinic) for history of OUD.  Has severe neuropathic pain secondary to phantom limb pain.  Refill of Lyrica  as below.  I have also offer the patient spinal cord stimulator trial for phantom limb pain which he will consider further.   Laboratory Chemistry Profile   Renal Lab Results  Component Value Date   BUN 11 07/05/2020  CREATININE 0.79 07/05/2020   GFR 133.59  08/22/2014   GFRAA >60 12/03/2019   GFRNONAA >60 07/05/2020    Hepatic Lab Results  Component Value Date   AST 59 (H) 07/05/2020   ALT 95 (H) 07/05/2020   ALBUMIN 4.0 07/05/2020   ALKPHOS 71 07/05/2020   LIPASE 54 (H) 07/05/2020    Electrolytes Lab Results  Component Value Date   NA 140 07/05/2020   K 3.9 07/05/2020   CL 104 07/05/2020   CALCIUM 9.4 07/05/2020    Bone No results found for: "VD25OH", "VD125OH2TOT", "YN8295AO1", "HY8657QI6", "25OHVITD1", "25OHVITD2", "25OHVITD3", "TESTOFREE", "TESTOSTERONE"  Inflammation (CRP: Acute Phase) (ESR: Chronic Phase) Lab Results  Component Value Date   LATICACIDVEN 1.4 12/15/2014         Note: Above Lab results reviewed.  Imaging  DG Chest 2 View CLINICAL DATA:  Chest pain, hypertension  EXAM: CHEST - 2 VIEW  COMPARISON:  06/05/2019  FINDINGS: The heart size and mediastinal contours are within normal limits. Both lungs are clear. The visualized skeletal structures are unremarkable.  IMPRESSION: No active cardiopulmonary disease.  Electronically Signed   By: Helyn Numbers MD   On: 12/03/2019 01:52  Assessment  The primary encounter diagnosis was Phantom limb pain (HCC). Diagnoses of Neuropathic pain, Status post below-knee amputation of left lower extremity (HCC), and Chronic pain syndrome were also pertinent to this visit.  Plan of Care    Mr. Burford Wheatley has a current medication list which includes the following long-term medication(s): apixaban, carvedilol, metformin, and pregabalin.  Patient states that his primary care provider checks his kidney function manually and it is within normal limits.  I informed him that should he have any compromise of his kidney function to let me know so that we can adjust therapy dosing.  Pharmacotherapy (Medications Ordered): Meds ordered this encounter  Medications   pregabalin (LYRICA) 150 MG capsule    Sig: Take 1 capsule (150 mg total) by mouth 3 (three) times daily.     Dispense:  90 capsule    Refill:  5    Follow-up plan:   Return in about 6 months (around 06/24/2023) for MM, F2F.    Recent Visits No visits were found meeting these conditions. Showing recent visits within past 90 days and meeting all other requirements Today's Visits Date Type Provider Dept  12/25/22 Office Visit Edward Jolly, MD [facility]  Showing today's visits and meeting all other requirements Future Appointments No visits were found meeting these conditions. Showing future appointments within next 90 days and meeting all other requirements  I discussed the assessment and treatment plan with the patient. The patient was provided an opportunity to ask questions and all were answered. The patient agreed with the plan and demonstrated an understanding of the instructions.  Patient advised to call back or seek an in-person evaluation if the symptoms or condition worsens.  Duration of encounter: .  Note by: Edward Jolly, MD Date: 12/25/2022; Time: 11:38 AM

## 2023-01-07 DIAGNOSIS — Z599 Problem related to housing and economic circumstances, unspecified: Secondary | ICD-10-CM | POA: Diagnosis not present

## 2023-01-07 DIAGNOSIS — Z136 Encounter for screening for cardiovascular disorders: Secondary | ICD-10-CM | POA: Diagnosis not present

## 2023-01-07 DIAGNOSIS — Z5986 Financial insecurity: Secondary | ICD-10-CM | POA: Diagnosis not present

## 2023-01-07 DIAGNOSIS — Z13228 Encounter for screening for other metabolic disorders: Secondary | ICD-10-CM | POA: Diagnosis not present

## 2023-01-07 DIAGNOSIS — Z7984 Long term (current) use of oral hypoglycemic drugs: Secondary | ICD-10-CM | POA: Diagnosis not present

## 2023-01-07 DIAGNOSIS — E119 Type 2 diabetes mellitus without complications: Secondary | ICD-10-CM | POA: Diagnosis not present

## 2023-01-07 DIAGNOSIS — Z1159 Encounter for screening for other viral diseases: Secondary | ICD-10-CM | POA: Diagnosis not present

## 2023-01-07 DIAGNOSIS — Z13220 Encounter for screening for lipoid disorders: Secondary | ICD-10-CM | POA: Diagnosis not present

## 2023-01-07 DIAGNOSIS — Z89512 Acquired absence of left leg below knee: Secondary | ICD-10-CM | POA: Diagnosis not present

## 2023-02-12 ENCOUNTER — Other Ambulatory Visit: Payer: Self-pay | Admitting: Adult Health

## 2023-02-12 DIAGNOSIS — Z86718 Personal history of other venous thrombosis and embolism: Secondary | ICD-10-CM

## 2023-02-12 DIAGNOSIS — I10 Essential (primary) hypertension: Secondary | ICD-10-CM

## 2023-02-13 NOTE — Telephone Encounter (Signed)
Patient medication has High Risk Warnings. Medication pend and sent to PCP Medina-Vargas, Margit Banda, NP for approval.

## 2023-02-20 ENCOUNTER — Other Ambulatory Visit: Payer: Self-pay | Admitting: Adult Health

## 2023-02-20 DIAGNOSIS — E1149 Type 2 diabetes mellitus with other diabetic neurological complication: Secondary | ICD-10-CM

## 2023-03-09 DIAGNOSIS — H04123 Dry eye syndrome of bilateral lacrimal glands: Secondary | ICD-10-CM | POA: Diagnosis not present

## 2023-03-09 DIAGNOSIS — H526 Other disorders of refraction: Secondary | ICD-10-CM | POA: Diagnosis not present

## 2023-04-13 ENCOUNTER — Telehealth: Payer: Self-pay | Admitting: Student in an Organized Health Care Education/Training Program

## 2023-04-13 NOTE — Telephone Encounter (Signed)
Called [facility], there are 2 remaining refills fro Pregabalin. Patient notified.

## 2023-04-13 NOTE — Telephone Encounter (Signed)
PT wife called stated that [facility] doesn't have any refills for Lyrica for patient to pick up. Please give patient a call.TY

## 2023-04-23 ENCOUNTER — Other Ambulatory Visit: Payer: Self-pay | Admitting: Adult Health

## 2023-04-23 DIAGNOSIS — Z86718 Personal history of other venous thrombosis and embolism: Secondary | ICD-10-CM

## 2023-04-24 ENCOUNTER — Other Ambulatory Visit: Payer: Self-pay | Admitting: Adult Health

## 2023-04-24 DIAGNOSIS — I10 Essential (primary) hypertension: Secondary | ICD-10-CM

## 2023-04-24 NOTE — Telephone Encounter (Signed)
 Patient has request refill on medication that's not due for refill. Medication pend and sent to Richarda Blade, NP due to PCP Medina-Vargas, Monina C, NP being out of office.

## 2023-04-24 NOTE — Telephone Encounter (Signed)
 Patient has request refill on medication Carvedilol. Patient isnt due for refill yet. Medication pend and sent to PCP Medina-Vargas, Margit Banda, NP for approval.

## 2023-06-23 ENCOUNTER — Ambulatory Visit
Payer: 59 | Attending: Student in an Organized Health Care Education/Training Program | Admitting: Student in an Organized Health Care Education/Training Program

## 2023-06-23 ENCOUNTER — Encounter: Payer: Self-pay | Admitting: Student in an Organized Health Care Education/Training Program

## 2023-06-23 VITALS — BP 124/89 | HR 76 | Temp 97.9°F | Ht 66.0 in | Wt 170.0 lb

## 2023-06-23 DIAGNOSIS — G894 Chronic pain syndrome: Secondary | ICD-10-CM | POA: Diagnosis not present

## 2023-06-23 DIAGNOSIS — M792 Neuralgia and neuritis, unspecified: Secondary | ICD-10-CM | POA: Insufficient documentation

## 2023-06-23 DIAGNOSIS — Z89512 Acquired absence of left leg below knee: Secondary | ICD-10-CM | POA: Diagnosis not present

## 2023-06-23 DIAGNOSIS — G546 Phantom limb syndrome with pain: Secondary | ICD-10-CM | POA: Insufficient documentation

## 2023-06-23 MED ORDER — PREGABALIN 150 MG PO CAPS
150.0000 mg | ORAL_CAPSULE | Freq: Three times a day (TID) | ORAL | 5 refills | Status: DC
Start: 2023-06-23 — End: 2023-06-23

## 2023-06-23 MED ORDER — PREGABALIN 150 MG PO CAPS
150.0000 mg | ORAL_CAPSULE | Freq: Three times a day (TID) | ORAL | 5 refills | Status: DC
Start: 2023-06-23 — End: 2023-12-31

## 2023-06-23 NOTE — Progress Notes (Signed)
 Safety precautions to be maintained throughout the outpatient stay will include: orient to surroundings, keep bed in low position, maintain call bell within reach at all times, provide assistance with transfer out of bed and ambulation.

## 2023-06-23 NOTE — Progress Notes (Signed)
 PROVIDER NOTE: Information contained herein reflects review and annotations entered in association with encounter. Interpretation of such information and data should be left to medically-trained personnel. Information provided to patient can be located elsewhere in the medical record under "Patient Instructions". Document created using STT-dictation technology, any transcriptional errors that may result from process are unintentional.    Patient: David Davies  Service Category: E/M  Provider: Edward Jolly, MD  DOB: 25-Jan-1981  DOS: 06/23/2023  Specialty: Interventional Pain Management  MRN: [id number]  Setting: Ambulatory outpatient  PCP: Gillis Santa, NP  Type: Established Patient    Referring Provider: Gillis Santa*  Location: Office  Delivery: Face-to-face     HPI  Mr. Westin Knotts, a 43 y.o. year old male, is here today because of his Phantom limb pain (HCC) [G54.6]. Mr. Tuggle primary complain today is Leg Pain (left) Last encounter: My last encounter with him was on 04/13/23 Pertinent problems: Mr. Tootle has Phantom limb pain (HCC); Status post below-knee amputation of left lower extremity (HCC); Neuropathic pain; and Chronic pain syndrome on their pertinent problem list. Pain Assessment: Severity of Chronic pain is reported as a 7 /10. Location: Leg Left/denies. Onset: More than a month ago. Quality: Throbbing. Timing: Constant. Modifying factor(s): meds. Vitals:  height is 5\' 6"  (1.676 m) and weight is 170 lb (77.1 kg). His temperature is 97.9 F (36.6 C). His blood pressure is 124/89 and his pulse is 76. His oxygen saturation is 100%.   Reason for encounter: medication management.   History of left below the knee amputation.  Currently on Suboxone (managed by Suboxone clinic) for history of OUD.  Has severe neuropathic pain secondary to phantom limb pain.  Refill of Lyrica as below.     ROS  Constitutional: Denies any fever or chills Gastrointestinal: No  reported hemesis, hematochezia, vomiting, or acute GI distress Musculoskeletal: Denies any acute onset joint swelling, redness, loss of ROM, or weakness Neurological:  Left phantom limb pain  Medication Review  Buprenorphine HCl-Naloxone HCl, apixaban, blood glucose meter kit and supplies, carvedilol, metFORMIN, and pregabalin  History Review  Allergy: Mr. Ahart has no known allergies. Drug: Mr. Sippel  reports no history of drug use. Alcohol:  reports no history of alcohol use. Tobacco:  reports that he has never smoked. He has never used smokeless tobacco. Social: Mr. Champney  reports that he has never smoked. He has never used smokeless tobacco. He reports that he does not drink alcohol and does not use drugs. Medical:  has a past medical history of Chronic back pain, Chronic leg pain, Chronic pain, Diabetes mellitus without complication (HCC), DVT (deep venous thrombosis) (HCC), Hypertension, and Left leg paresthesias. Surgical: Mr. Celli  has a past surgical history that includes Thrombectomy; Fasciotomy; and left bka. Family: family history is not on file.  Laboratory Chemistry Profile   Renal Lab Results  Component Value Date   BUN 11 07/05/2020   CREATININE 0.79 07/05/2020   GFR 133.59 08/22/2014   GFRAA >60 12/03/2019   GFRNONAA >60 07/05/2020    Hepatic Lab Results  Component Value Date   AST 59 (H) 07/05/2020   ALT 95 (H) 07/05/2020   ALBUMIN 4.0 07/05/2020   ALKPHOS 71 07/05/2020   LIPASE 54 (H) 07/05/2020    Electrolytes Lab Results  Component Value Date   NA 140 07/05/2020   K 3.9 07/05/2020   CL 104 07/05/2020   CALCIUM 9.4 07/05/2020    Bone No results found for: "VD25OH", "VD125OH2TOT", "NW2956OZ3", "YQ6578IO9", "  25OHVITD1", "25OHVITD2", "25OHVITD3", "TESTOFREE", "TESTOSTERONE"  Inflammation (CRP: Acute Phase) (ESR: Chronic Phase) Lab Results  Component Value Date   LATICACIDVEN 1.4 12/15/2014         Note: Above Lab results  reviewed.  Recent Imaging Review  DG Chest 2 View CLINICAL DATA:  Chest pain, hypertension  EXAM: CHEST - 2 VIEW  COMPARISON:  06/05/2019  FINDINGS: The heart size and mediastinal contours are within normal limits. Both lungs are clear. The visualized skeletal structures are unremarkable.  IMPRESSION: No active cardiopulmonary disease.  Electronically Signed   By: Helyn Numbers MD   On: 12/03/2019 01:52 Note: Reviewed        Physical Exam  General appearance: Well nourished, well developed, and well hydrated. In no apparent acute distress Mental status: Alert, oriented x 3 (person, place, & time)       Respiratory: No evidence of acute respiratory distress Eyes: PERLA Vitals: BP 124/89   Pulse 76   Temp 97.9 F (36.6 C)   Ht 5\' 6"  (1.676 m)   Wt 170 lb (77.1 kg)   SpO2 100%   BMI 27.44 kg/m  BMI: Estimated body mass index is 27.44 kg/m as calculated from the following:   Height as of this encounter: 5\' 6"  (1.676 m).   Weight as of this encounter: 170 lb (77.1 kg). Ideal: Ideal body weight: 63.8 kg (140 lb 10.5 oz) Adjusted ideal body weight: 69.1 kg (152 lb 6.3 oz)   Left below the knee amputation  Assessment   Status Diagnosis  Persistent Persistent Persistent 1. Phantom limb pain (HCC)   2. Neuropathic pain   3. Status post below-knee amputation of left lower extremity (HCC)   4. Chronic pain syndrome        Plan of Care    Mr. Edwar Coe has a current medication list which includes the following long-term medication(s): carvedilol, eliquis, metformin, and pregabalin.  Pharmacotherapy (Medications Ordered): Meds ordered this encounter  Medications   DISCONTD: pregabalin (LYRICA) 150 MG capsule    Sig: Take 1 capsule (150 mg total) by mouth 3 (three) times daily.    Dispense:  90 capsule    Refill:  5   pregabalin (LYRICA) 150 MG capsule    Sig: Take 1 capsule (150 mg total) by mouth 3 (three) times daily.    Dispense:  90 capsule     Refill:  5   No orders of the defined types were placed in this encounter.    Consider spinal cord stimulation in future pending lumbar MRI and nerve conduction velocity/EMG study  Follow-up plan:   Return in about 6 months (around 12/24/2023) for MM, F2F, add to NP list.    Recent Visits No visits were found meeting these conditions. Showing recent visits within past 90 days and meeting all other requirements Today's Visits Date Type Provider Dept  06/23/23 Office Visit Edward Jolly, MD [facility]  Showing today's visits and meeting all other requirements Future Appointments No visits were found meeting these conditions. Showing future appointments within next 90 days and meeting all other requirements  I discussed the assessment and treatment plan with the patient. The patient was provided an opportunity to ask questions and all were answered. The patient agreed with the plan and demonstrated an understanding of the instructions.  Patient advised to call back or seek an in-person evaluation if the symptoms or condition worsens.  Duration of encounter: .  Note by: Edward Jolly, MD Date: 06/23/2023; Time: 12:19 PM

## 2023-06-26 ENCOUNTER — Telehealth: Payer: Self-pay | Admitting: Pharmacist

## 2023-06-26 DIAGNOSIS — Z79899 Other long term (current) drug therapy: Secondary | ICD-10-CM

## 2023-06-26 NOTE — Progress Notes (Signed)
   06/26/2023  Patient ID: David Davies, male   DOB: Aug 29, 1980, 43 y.o.   MRN: [id number]  Called Patient as he was on the [facility] diabetes report.  Per chart review and Patient conversation, He is being seen by a provider at [facility] and no longer lives in the [place], [place] area.  He is seeing Amalia Greenhouse.  I will make a note under the Patient's care team.  Beecher Mcardle, PharmD, [facility] Clinical Pharmacist [phone]

## 2023-11-06 ENCOUNTER — Other Ambulatory Visit: Payer: Self-pay | Admitting: Adult Health

## 2023-11-06 DIAGNOSIS — E1149 Type 2 diabetes mellitus with other diabetic neurological complication: Secondary | ICD-10-CM

## 2023-12-10 DIAGNOSIS — Z13220 Encounter for screening for lipoid disorders: Secondary | ICD-10-CM | POA: Diagnosis not present

## 2023-12-10 DIAGNOSIS — E1165 Type 2 diabetes mellitus with hyperglycemia: Secondary | ICD-10-CM | POA: Diagnosis not present

## 2023-12-10 DIAGNOSIS — Z0000 Encounter for general adult medical examination without abnormal findings: Secondary | ICD-10-CM | POA: Diagnosis not present

## 2023-12-10 DIAGNOSIS — Z9710 Presence of artificial limb (complete) (partial), unspecified: Secondary | ICD-10-CM | POA: Diagnosis not present

## 2023-12-22 ENCOUNTER — Ambulatory Visit: Admitting: Student in an Organized Health Care Education/Training Program

## 2023-12-27 ENCOUNTER — Other Ambulatory Visit: Payer: Self-pay | Admitting: Student in an Organized Health Care Education/Training Program

## 2023-12-27 ENCOUNTER — Other Ambulatory Visit: Payer: Self-pay | Admitting: Adult Health

## 2023-12-27 ENCOUNTER — Other Ambulatory Visit: Payer: Self-pay | Admitting: Family

## 2023-12-27 DIAGNOSIS — E1149 Type 2 diabetes mellitus with other diabetic neurological complication: Secondary | ICD-10-CM

## 2023-12-27 DIAGNOSIS — M792 Neuralgia and neuritis, unspecified: Secondary | ICD-10-CM

## 2023-12-27 DIAGNOSIS — I10 Essential (primary) hypertension: Secondary | ICD-10-CM

## 2023-12-27 DIAGNOSIS — Z86718 Personal history of other venous thrombosis and embolism: Secondary | ICD-10-CM

## 2023-12-31 ENCOUNTER — Encounter: Payer: Self-pay | Admitting: Nurse Practitioner

## 2023-12-31 ENCOUNTER — Ambulatory Visit: Attending: Nurse Practitioner | Admitting: Nurse Practitioner

## 2023-12-31 DIAGNOSIS — Z89512 Acquired absence of left leg below knee: Secondary | ICD-10-CM

## 2023-12-31 DIAGNOSIS — G546 Phantom limb syndrome with pain: Secondary | ICD-10-CM | POA: Diagnosis not present

## 2023-12-31 DIAGNOSIS — G894 Chronic pain syndrome: Secondary | ICD-10-CM

## 2023-12-31 DIAGNOSIS — M792 Neuralgia and neuritis, unspecified: Secondary | ICD-10-CM

## 2023-12-31 DIAGNOSIS — Z79899 Other long term (current) drug therapy: Secondary | ICD-10-CM

## 2023-12-31 MED ORDER — PREGABALIN 150 MG PO CAPS: 150.0000 mg | ORAL_CAPSULE | Freq: Three times a day (TID) | ORAL | 5 refills | Status: AC

## 2023-12-31 NOTE — Progress Notes (Addendum)
 PROVIDER NOTE: Interpretation of information contained herein should be left to medically-trained personnel. Specific patient instructions are provided elsewhere under Patient Instructions section of medical record. This document was created in part using AI and STT-dictation technology, any transcriptional errors that may result from this process are unintentional.  Patient: David Davies  Service: E/M   PCP: Arminda Graces, DO  DOB: Apr 23, 1980  DOS: 12/31/2023  Provider: Emmy MARLA Blanch, NP  MRN: [id number]  Delivery: Virtual Visit  Specialty: Interventional Pain Management  Type: Established Patient  Setting: Ambulatory outpatient facility  Specialty designation: 09  Referring Prov.: Arminda Graces, DO  Location: Remote location       Virtual Encounter - Pain Management PROVIDER NOTE: Information contained herein reflects review and annotations entered in association with encounter. Interpretation of such information and data should be left to medically-trained personnel. Information provided to patient can be located elsewhere in the medical record under Patient Instructions. Document created using STT-dictation technology, any transcriptional errors that may result from process are unintentional.    Contact & Pharmacy Preferred: [phone] Home: [phone] (home) Mobile: [phone] (mobile) E-mail: [id number] [place], [place] - [address] AT [facility] OF [address] [place] [address] Phone: [phone] Fax: [phone]  [facility] Delivery - [place], [place] - [address] [place]  [address] Phone: [phone] Fax: [phone]  [id number] - [place], [place] - [address] AT [facility] [address] [place] [address] Phone: [phone] Fax: [phone]   Pre-screening  Mr. Gauthreaux offered in-person vs virtual encounter. He  indicated preferring virtual for this encounter.   Reason COVID-19*  Social distancing based on CDC and AMA recommendations.   I contacted David Davies on 12/31/2023 via telephone.      I clearly identified myself as Emmy MARLA Blanch, NP. I verified that I was speaking with the correct person using two identifiers (Name: David Davies, and date of birth: 29-Mar-1981).  Consent I sought verbal advanced consent from David Davies for virtual visit interactions. I informed David Davies of possible security and privacy concerns, risks, and limitations associated with providing not-in-person medical evaluation and management services. I also informed David Davies of the availability of in-person appointments. Finally, I informed him that there would be a charge for the virtual visit and that he could be  personally, fully or partially, financially responsible for it. David Davies expressed understanding and agreed to proceed.   Historic Elements   David Davies is a 43 y.o. year old, male patient evaluated today after our last contact on Visit date not found. David Davies  has a past medical history of Chronic back pain, Chronic leg pain, Chronic pain, Diabetes mellitus without complication (HCC), DVT (deep venous thrombosis) (HCC), Hypertension, and Left leg paresthesias. He also  has a past surgical history that includes Thrombectomy; Fasciotomy; and left bka. David Davies has a current medication list which includes the following prescription(s): pregabalin , blood glucose meter kit and supplies, buprenorphine  hcl-naloxone  hcl, carvedilol , eliquis , and metformin . He  reports that he has never smoked. He has never used smokeless tobacco. He reports that he does not drink alcohol and does not use drugs. David Davies has no known allergies.  BMI: Estimated body mass index is 27.44 kg/m as calculated from the following:   Height as of 06/23/23: 5' 6 (1.676 m).   Weight as of 06/23/23:  170 lb (77.1 kg). Last  encounter: Visit date not found. Last procedure: Visit date not found.  HPI  Today, he is being contacted for medication management.No change in medical history since last visit.  Patient's pain is at baseline.  Patient continues multimodal pain regimen as prescribed.  States that it provides pain relief and improvement in functional status.  The patient is currently on Suboxone  (managed by Suboxone  clinic) for history of OUD.  The patient had severe neuropathic pain consistent with phantom limb pain.  He has a history of below the knee amputation.  He is currently taking Lyrica  150 mg 3 times daily for neuropathic pain  Pharmacotherapy Assessment  Pregabalin  (Lyrica ) 150 mg capsule 3 times daily for neuropathic pain Monitoring:reports [place] PMP: PDMP reviewed during this encounter.       Pharmacotherapy: No side-effects or adverse reactions reported. Compliance: No problems identified. Effectiveness: Clinically acceptable. Plan: Refer to POC.  UDS: No results found for: SUMMARY No results found for: MABLE OYSTER, D9THCCBX  Laboratory Chemistry Profile   Renal Lab Results  Component Value Date   BUN 11 07/05/2020   CREATININE 0.79 07/05/2020   GFR 133.59 08/22/2014   GFRAA >60 12/03/2019   GFRNONAA >60 07/05/2020    Hepatic Lab Results  Component Value Date   AST 59 (H) 07/05/2020   ALT 95 (H) 07/05/2020   ALBUMIN 4.0 07/05/2020   ALKPHOS 71 07/05/2020   LIPASE 54 (H) 07/05/2020    Electrolytes Lab Results  Component Value Date   NA 140 07/05/2020   K 3.9 07/05/2020   CL 104 07/05/2020   CALCIUM 9.4 07/05/2020    Bone No results found for: VD25OH, [id number], CI6874NY7, CI7874NY7, 25OHVITD1, 25OHVITD2, 25OHVITD3, TESTOFREE, TESTOSTERONE  Inflammation (CRP: Acute Phase) (ESR: Chronic Phase) Lab Results  Component Value Date   LATICACIDVEN 1.4 12/15/2014         Note: Above Lab results reviewed.  Imaging  DG Chest 2 View CLINICAL  DATA:  Chest pain, hypertension  EXAM: CHEST - 2 VIEW  COMPARISON:  06/05/2019  FINDINGS: The heart size and mediastinal contours are within normal limits. Both lungs are clear. The visualized skeletal structures are unremarkable.  IMPRESSION: No active cardiopulmonary disease.  Electronically Signed   By: Dorethia Molt MD   On: 12/03/2019 01:52  Assessment  The primary encounter diagnosis was Chronic pain syndrome. Diagnoses of Neuropathic pain, Phantom limb pain (HCC), Status post below-knee amputation of left lower extremity (HCC), and Medication management were also pertinent to this visit.  Plan of Care  Problem-specific:  We will continue on current medication regimen.  Prescribing drug monitoring (PDMP) reviewed; findings consistent with the use of prescribed medication.  No other new issues or concerns reported at this visit.  Schedule follow-up in 6 months for medication management with David Dunn, NP.   David Davies has a current medication list which includes the following long-term medication(s): pregabalin , carvedilol , eliquis , and metformin .  Pharmacotherapy (Medications Ordered): Meds ordered this encounter  Medications   pregabalin  (LYRICA ) 150 MG capsule    Sig: Take 1 capsule (150 mg total) by mouth 3 (three) times daily.    Dispense:  90 capsule    Refill:  5   Orders:    Follow-up plan:   Return in about 6 months (around 06/29/2024) for (F2F), (MM), Emmy Blanch NP.          Recent Visits No visits were found meeting these conditions. Showing recent visits within past 90 days and  meeting all other requirements Today's Visits Date Type Provider Dept  12/31/23 Office Visit David Davies K, NP [facility]  Showing today's visits and meeting all other requirements Future Appointments No visits were found meeting these conditions. Showing future appointments within next 90 days and meeting all other requirements  I discussed the  assessment and treatment plan with the patient. The patient was provided an opportunity to ask questions and all were answered. The patient agreed with the plan and demonstrated an understanding of the instructions.  Patient advised to call back or seek an in-person evaluation if the symptoms or condition worsens.  Duration of encounter: 20 minutes.  Note by: Emmy MARLA Blanch, NP Date: 12/31/2023; Time: 1:44 PM

## 2024-01-08 NOTE — Progress Notes (Signed)
 David Davies                                          MRN: [id number]   01/08/2024   The [facility] Quality Team Specialist reviewed this patient medical record for the purposes of chart review for care gap closure. The following were reviewed: chart review for care gap closure-glycemic status assessment.    [facility] Quality Team

## 2024-01-11 ENCOUNTER — Other Ambulatory Visit: Payer: Self-pay | Admitting: Family

## 2024-01-11 ENCOUNTER — Other Ambulatory Visit: Payer: Self-pay | Admitting: Adult Health

## 2024-01-11 DIAGNOSIS — I10 Essential (primary) hypertension: Secondary | ICD-10-CM

## 2024-01-11 DIAGNOSIS — Z86718 Personal history of other venous thrombosis and embolism: Secondary | ICD-10-CM

## 2024-01-11 DIAGNOSIS — E1149 Type 2 diabetes mellitus with other diabetic neurological complication: Secondary | ICD-10-CM

## 2024-01-12 NOTE — Telephone Encounter (Signed)
 Patient has not been seen in office since 12/05/2022. Patient also has no upcoming appointments and another Dr as PCP Arminda, Zhe, DO . Medications pend and sent to Roxan Plough, NP for approval.

## 2024-01-18 ENCOUNTER — Other Ambulatory Visit: Payer: Self-pay | Admitting: Adult Health

## 2024-01-18 ENCOUNTER — Other Ambulatory Visit: Payer: Self-pay | Admitting: Family

## 2024-01-18 ENCOUNTER — Encounter: Payer: Self-pay | Admitting: *Deleted

## 2024-01-18 DIAGNOSIS — E1149 Type 2 diabetes mellitus with other diabetic neurological complication: Secondary | ICD-10-CM

## 2024-01-18 DIAGNOSIS — Z86718 Personal history of other venous thrombosis and embolism: Secondary | ICD-10-CM

## 2024-01-18 DIAGNOSIS — I10 Essential (primary) hypertension: Secondary | ICD-10-CM

## 2024-01-18 NOTE — Progress Notes (Signed)
 Lisle Skillman                                          MRN: [id number]   01/18/2024   The [facility] Quality Team Specialist reviewed this patient medical record for the purposes of chart review for care gap closure. The following were reviewed: staff audit for quality review.    [facility] Quality Team

## 2024-02-11 ENCOUNTER — Other Ambulatory Visit: Payer: Self-pay | Admitting: Family

## 2024-02-11 ENCOUNTER — Other Ambulatory Visit: Payer: Self-pay | Admitting: Adult Health

## 2024-02-11 DIAGNOSIS — I10 Essential (primary) hypertension: Secondary | ICD-10-CM

## 2024-02-11 DIAGNOSIS — E1149 Type 2 diabetes mellitus with other diabetic neurological complication: Secondary | ICD-10-CM

## 2024-02-11 DIAGNOSIS — Z86718 Personal history of other venous thrombosis and embolism: Secondary | ICD-10-CM

## 2024-02-12 ENCOUNTER — Encounter: Payer: Self-pay | Admitting: Pharmacist

## 2024-02-24 ENCOUNTER — Other Ambulatory Visit: Payer: Self-pay | Admitting: Adult Health

## 2024-02-24 ENCOUNTER — Other Ambulatory Visit: Payer: Self-pay | Admitting: Family

## 2024-02-24 DIAGNOSIS — E1149 Type 2 diabetes mellitus with other diabetic neurological complication: Secondary | ICD-10-CM

## 2024-02-24 DIAGNOSIS — Z86718 Personal history of other venous thrombosis and embolism: Secondary | ICD-10-CM

## 2024-02-24 DIAGNOSIS — I10 Essential (primary) hypertension: Secondary | ICD-10-CM

## 2024-02-29 NOTE — Progress Notes (Signed)
 David Davies                                          MRN: [id number]   02/29/2024   The [facility] Quality Team Specialist reviewed this patient medical record for the purposes of chart review for care gap closure. The following were reviewed: chart review for care gap closure-glycemic status assessment.    [facility] Quality Team

## 2024-04-01 ENCOUNTER — Telehealth: Payer: Self-pay

## 2024-04-01 NOTE — Telephone Encounter (Signed)
 Patient was identified as falling into the [employer] [place] Measure - Diabetes.   Patient was: Attribution and/or data issue.  Validation/Investigation needed.  Explanation:  Patient has moved to [place]. He no longer sees [facility].

## 2024-05-09 NOTE — Progress Notes (Signed)
 David Davies                                          MRN: [id number]   05/09/2024   The [facility] Quality Team Specialist reviewed this patient medical record for the purposes of chart review for care gap closure. The following were reviewed: chart review for care gap closure-glycemic status assessment.    [facility] Quality Team

## 2024-06-23 ENCOUNTER — Encounter: Admitting: Nurse Practitioner
